# Patient Record
Sex: Female | Born: 1975
Health system: Southern US, Community
[De-identification: ages and names within clinical notes are randomized; demographics above are authoritative.]

## PROBLEM LIST (undated history)

## (undated) DIAGNOSIS — R519 Headache, unspecified: Secondary | ICD-10-CM

## (undated) DIAGNOSIS — E2740 Unspecified adrenocortical insufficiency: Secondary | ICD-10-CM

## (undated) DIAGNOSIS — R011 Cardiac murmur, unspecified: Secondary | ICD-10-CM

## (undated) DIAGNOSIS — M069 Rheumatoid arthritis, unspecified: Secondary | ICD-10-CM

## (undated) DIAGNOSIS — M810 Age-related osteoporosis without current pathological fracture: Secondary | ICD-10-CM

## (undated) DIAGNOSIS — R0683 Snoring: Secondary | ICD-10-CM

## (undated) DIAGNOSIS — K219 Gastro-esophageal reflux disease without esophagitis: Secondary | ICD-10-CM

## (undated) DIAGNOSIS — M0880 Other juvenile arthritis, unspecified site: Secondary | ICD-10-CM

## (undated) DIAGNOSIS — R51 Headache: Secondary | ICD-10-CM

## (undated) DIAGNOSIS — M519 Unspecified thoracic, thoracolumbar and lumbosacral intervertebral disc disorder: Secondary | ICD-10-CM

## (undated) DIAGNOSIS — L309 Dermatitis, unspecified: Secondary | ICD-10-CM

## (undated) HISTORY — PX: CARPAL TUNNEL RELEASE: SHX101

## (undated) HISTORY — PX: CHOLECYSTECTOMY: SHX55

## (undated) HISTORY — PX: OTHER SURGICAL HISTORY: SHX169

## (undated) HISTORY — PX: JOINT REPLACEMENT: SHX530

## (undated) HISTORY — PX: ORIF FEMUR FRACTURE: SHX2119

## (undated) HISTORY — PX: WRIST SURGERY: SHX841

## (undated) HISTORY — PX: ESSURE TUBAL LIGATION: SUR464

## (undated) HISTORY — PX: COLONOSCOPY: SHX174

## (undated) HISTORY — DX: Other juvenile arthritis, unspecified site: M08.80

## (undated) HISTORY — PX: APPENDECTOMY: SHX54

## (undated) HISTORY — PX: ANKLE ARTHROPLASTY: SUR68

---

## 2004-01-05 HISTORY — PX: CHOLECYSTECTOMY: SHX55

## 2012-12-28 DIAGNOSIS — M818 Other osteoporosis without current pathological fracture: Secondary | ICD-10-CM | POA: Insufficient documentation

## 2012-12-28 DIAGNOSIS — M810 Age-related osteoporosis without current pathological fracture: Secondary | ICD-10-CM | POA: Insufficient documentation

## 2013-01-16 DIAGNOSIS — D849 Immunodeficiency, unspecified: Secondary | ICD-10-CM | POA: Insufficient documentation

## 2013-01-16 DIAGNOSIS — Z8619 Personal history of other infectious and parasitic diseases: Secondary | ICD-10-CM | POA: Insufficient documentation

## 2013-01-16 DIAGNOSIS — D1800 Hemangioma unspecified site: Secondary | ICD-10-CM | POA: Insufficient documentation

## 2013-01-16 DIAGNOSIS — E2749 Other adrenocortical insufficiency: Secondary | ICD-10-CM | POA: Insufficient documentation

## 2013-07-19 ENCOUNTER — Ambulatory Visit: Payer: Self-pay | Admitting: Family Medicine

## 2013-08-20 DIAGNOSIS — IMO0002 Reserved for concepts with insufficient information to code with codable children: Secondary | ICD-10-CM

## 2013-08-20 HISTORY — DX: Reserved for concepts with insufficient information to code with codable children: IMO0002

## 2013-08-24 DIAGNOSIS — N890 Mild vaginal dysplasia: Secondary | ICD-10-CM | POA: Insufficient documentation

## 2014-11-01 ENCOUNTER — Other Ambulatory Visit: Payer: Self-pay | Admitting: Obstetrics and Gynecology

## 2014-11-01 ENCOUNTER — Other Ambulatory Visit (HOSPITAL_COMMUNITY): Payer: Self-pay | Admitting: Obstetrics and Gynecology

## 2014-11-01 DIAGNOSIS — N971 Female infertility of tubal origin: Secondary | ICD-10-CM

## 2014-11-15 ENCOUNTER — Other Ambulatory Visit: Payer: Self-pay | Admitting: Family Medicine

## 2014-11-15 DIAGNOSIS — Z1231 Encounter for screening mammogram for malignant neoplasm of breast: Secondary | ICD-10-CM

## 2014-11-20 ENCOUNTER — Ambulatory Visit
Admission: RE | Admit: 2014-11-20 | Discharge: 2014-11-20 | Disposition: A | Discharge status: Home / Self Care | Payer: 59 | Source: Ambulatory Visit | Attending: Family Medicine | Admitting: Family Medicine

## 2014-11-20 DIAGNOSIS — Z1231 Encounter for screening mammogram for malignant neoplasm of breast: Secondary | ICD-10-CM | POA: Insufficient documentation

## 2015-01-20 ENCOUNTER — Ambulatory Visit (HOSPITAL_COMMUNITY)
Admission: RE | Admit: 2015-01-20 | Discharge: 2015-01-20 | Disposition: A | Discharge status: Home / Self Care | Payer: 59 | Source: Ambulatory Visit | Attending: Obstetrics and Gynecology | Admitting: Obstetrics and Gynecology

## 2015-01-20 DIAGNOSIS — Z3049 Encounter for surveillance of other contraceptives: Secondary | ICD-10-CM | POA: Insufficient documentation

## 2015-01-20 DIAGNOSIS — N971 Female infertility of tubal origin: Secondary | ICD-10-CM | POA: Diagnosis not present

## 2015-01-20 MED ORDER — IOHEXOL 300 MG/ML  SOLN
30.0000 mL | Freq: Once | INTRAMUSCULAR | Status: AC | PRN
Start: 2015-01-20 — End: 2015-01-20
  Administered 2015-01-20: 30 mL

## 2015-01-30 DIAGNOSIS — K111 Hypertrophy of salivary gland: Secondary | ICD-10-CM | POA: Diagnosis not present

## 2015-01-30 DIAGNOSIS — M9901 Segmental and somatic dysfunction of cervical region: Secondary | ICD-10-CM | POA: Diagnosis not present

## 2015-01-30 DIAGNOSIS — M542 Cervicalgia: Secondary | ICD-10-CM | POA: Diagnosis not present

## 2015-03-17 ENCOUNTER — Other Ambulatory Visit: Payer: Self-pay | Admitting: Family Medicine

## 2015-03-17 DIAGNOSIS — M810 Age-related osteoporosis without current pathological fracture: Secondary | ICD-10-CM

## 2015-03-17 DIAGNOSIS — M069 Rheumatoid arthritis, unspecified: Secondary | ICD-10-CM | POA: Diagnosis not present

## 2015-03-17 DIAGNOSIS — Z23 Encounter for immunization: Secondary | ICD-10-CM | POA: Diagnosis not present

## 2015-03-17 DIAGNOSIS — E785 Hyperlipidemia, unspecified: Secondary | ICD-10-CM | POA: Diagnosis not present

## 2015-03-20 MED FILL — MAGIC MOUTHWASH BOP FORM: 6 days supply | Qty: 120 | Fill #0

## 2015-03-20 MED FILL — clonazePAM 1 MG TABS: 1 | 30 days supply | Qty: 30 | Fill #0

## 2015-04-11 ENCOUNTER — Ambulatory Visit (INDEPENDENT_AMBULATORY_CARE_PROVIDER_SITE_OTHER): Payer: 59

## 2015-04-11 ENCOUNTER — Encounter (HOSPITAL_COMMUNITY): Payer: Self-pay | Admitting: Emergency Medicine

## 2015-04-11 ENCOUNTER — Ambulatory Visit (HOSPITAL_COMMUNITY)
Admission: EM | Admit: 2015-04-11 | Discharge: 2015-04-11 | Disposition: A | Discharge status: Home / Self Care | Payer: 59 | Attending: Emergency Medicine | Admitting: Emergency Medicine

## 2015-04-11 DIAGNOSIS — M5030 Other cervical disc degeneration, unspecified cervical region: Secondary | ICD-10-CM | POA: Diagnosis not present

## 2015-04-11 DIAGNOSIS — M542 Cervicalgia: Secondary | ICD-10-CM

## 2015-04-11 DIAGNOSIS — M4692 Unspecified inflammatory spondylopathy, cervical region: Secondary | ICD-10-CM

## 2015-04-11 DIAGNOSIS — M47812 Spondylosis without myelopathy or radiculopathy, cervical region: Secondary | ICD-10-CM

## 2015-04-11 HISTORY — DX: Age-related osteoporosis without current pathological fracture: M81.0

## 2015-04-11 HISTORY — DX: Unspecified adrenocortical insufficiency: E27.40

## 2015-04-11 HISTORY — DX: Unspecified thoracic, thoracolumbar and lumbosacral intervertebral disc disorder: M51.9

## 2015-04-11 MED ORDER — KETOROLAC TROMETHAMINE 60 MG/2ML IM SOLN
INTRAMUSCULAR | Status: AC
  Filled 2015-04-11: qty 2

## 2015-04-11 MED ORDER — HYDROCODONE-ACETAMINOPHEN 5-325 MG PO TABS: 1.0000 | ORAL_TABLET | ORAL | Status: DC | PRN

## 2015-04-11 MED ORDER — KETOROLAC TROMETHAMINE 60 MG/2ML IM SOLN
45.0000 mg | Freq: Once | INTRAMUSCULAR | Status: AC
  Administered 2015-04-11: 45 mg via INTRAMUSCULAR

## 2015-04-11 MED FILL — HYDROCODON-APAP 5-325: 5-325 | 4 days supply | Qty: 24 | Fill #0

## 2015-04-11 NOTE — ED Notes (Signed)
PT reports severe neck pain with numbness in bilateral finger tips. Intermittent numbness over back of scalp, lumbar spine, and the bottoms of her feet. PT reports her neck feels as though she has bone on bone pain in neck. PT has tried ibuprofen. PT has a history of RA and degenerative disk disease. Symptoms have been worsening over the past week. PT could not get appt at PCP

## 2015-04-11 NOTE — ED Notes (Signed)
PT made aware that she cannot drive or operate machinery while using Vicodin for pain. PT acknowledges instructions.

## 2015-04-11 NOTE — ED Provider Notes (Signed)
CSN: [id number]     Arrival date & time 04/11/15  1255 History   First MD Initiated Contact with Patient 04/11/15 1308     Chief Complaint  Patient presents with  . Neck Pain   (Consider location/radiation/quality/duration/timing/severity/associated sxs/prior Treatment) HPI Comments: 40 year old female with a history of degenerative disc disease of the C-spine as well as poly-arthropathy secondary to rheumatoid arthritis is complaining of an increase in pain and symptoms associated with C-spine degenerative disease. She feels like she has bowel rubbing on bone in her neck and the pain has increased. She has been seen by her PCP at [facility] and referred to Dr. Sherwood Gambler. She is unable to see her PCP today. Arrangements were made in the last couple days for her to have a C-spine since the last one she had was over 2 years ago. She has the order for her C-spine at work. She has not made any arrangements to have it done at any particular place. Her expectations appear to have the C-spines here done today as well as pain control until she can see Dr. Sherwood Gambler.   Past Medical History  Diagnosis Date  . Arthritis   . Intervertebral disk disease   . Osteoporosis   . Adrenal insufficiency [facility])    Past Surgical History  Procedure Laterality Date  . Appendectomy    . Cholecystectomy    . Abdominal surgery     Family History  Problem Relation Age of Onset  . Breast cancer Mother 12  . Breast cancer Maternal Aunt     2 mat aunts    Social History  Substance Use Topics  . Smoking status: Current Every Day Smoker -- 0.25 packs/day  . Smokeless tobacco: None  . Alcohol Use: Yes     Comment: social   OB History    No data available     Review of Systems  Constitutional: Negative for fever, activity change and fatigue.  HENT: Negative for congestion, ear pain and sore throat.   Respiratory: Negative.   Cardiovascular: Negative.   Genitourinary: Negative.   Musculoskeletal: Positive  for neck pain. Negative for back pain and neck stiffness.  Skin: Negative.   Neurological: Positive for numbness. Negative for dizziness, tremors, syncope, facial asymmetry, speech difficulty and light-headedness.  Psychiatric/Behavioral: Negative.     Allergies  Sulfa antibiotics  Home Medications   Prior to Admission medications   Medication Sig Start Date End Date Taking? Authorizing Provider  butalbital-acetaminophen-caffeine (FIORICET, ESGIC) 50-325-40 MG tablet Take 1 tablet by mouth as needed for headache.   Yes Historical Provider, MD  ibandronate (BONIVA) 150 MG tablet Take 150 mg by mouth every 30 (thirty) days. Take in the morning with a full glass of water, on an empty stomach, and do not take anything else by mouth or lie down for the next 30 min.   Yes Historical Provider, MD  Multiple Vitamin (MULTIVITAMIN) tablet Take 1 tablet by mouth daily.   Yes Historical Provider, MD  predniSONE (DELTASONE) 5 MG tablet Take 5 mg by mouth daily with breakfast.   Yes Historical Provider, MD  HYDROcodone-acetaminophen (NORCO/VICODIN) 5-325 MG tablet Take 1 tablet by mouth every 4 (four) hours as needed. 04/11/15   Janne Napoleon, NP   Meds Ordered and Administered this Visit   Medications  ketorolac (TORADOL) injection 45 mg (45 mg Intramuscular Given 04/11/15 1344)    BP 103/70 mmHg  Pulse 77  Temp(Src) 98.7 F (37.1 C) (Oral)  Resp 16  Ht 5' (1.524  m)  Wt 95 lb (43.092 kg)  BMI 18.55 kg/m2  SpO2 97%  LMP 03/09/2015 No data found.   Physical Exam  Constitutional: She appears well-developed and well-nourished. No distress.  Eyes: Conjunctivae and EOM are normal.  Neck:  Decreased range of motion in regards to left and right rotation which is limited to approximately 40-45 left and right. She is able to slowly flex completely. There is minor tenderness to the posterior spine as well as the left lateral splenius capitis muscle.  Cardiovascular: Normal rate.   Pulmonary/Chest:  Effort normal. No respiratory distress.  Musculoskeletal: Normal range of motion. She exhibits no edema.  Lymphadenopathy:    She has no cervical adenopathy.  Neurological: She is alert. No cranial nerve deficit. She exhibits normal muscle tone.  Skin: Skin is warm and dry.  Psychiatric: She has a normal mood and affect.  Nursing note and vitals reviewed.   ED Course  Procedures (including critical care time)  Labs Review Labs Reviewed - No data to display  Imaging Review Dg Cervical Spine Complete  04/11/2015  CLINICAL DATA:  40 year old female with acute on chronic cervical neck pain. Initial encounter. EXAM: CERVICAL SPINE - COMPLETE 4+ VIEW COMPARISON:  None. FINDINGS: Multilevel degenerative appearing spondylolisthesis in the cervical spine, including C3-C4 and C4-C5. Disc space loss and facet hypertrophy at those levels. Normal prevertebral soft tissue contour. Overall straightening of lordosis. Cervicothoracic junction alignment is within normal limits. Mild dextro convex curvature of the cervical spine. Lung apices within normal limits. C1-C2 alignment difficult to evaluate, appears to be normal on the oblique views, but suspect joint space loss with C1 see 2 subchondral sclerosis. Odontoid appears within normal limits. IMPRESSION: Moderately to severely age advanced degenerative changes in the cervical spine. Multilevel degenerative appearing spondylolisthesis with chronic disc and facet degeneration. Advanced C1-C2 arthritis also suspected. Electronically Signed   By: Genevie Ann M.D.   On: 04/11/2015 14:09     Visual Acuity Review  Right Eye Distance:   Left Eye Distance:   Bilateral Distance:    Right Eye Near:   Left Eye Near:    Bilateral Near:         MDM   1. DDD (degenerative disc disease), cervical   2. Neck pain   3. Facet arthritis of cervical region ([place])    norco 5 mg #24 Soft c-collar F/u dr. Ivory Broad, NP 04/11/15 1439

## 2015-04-11 NOTE — Discharge Instructions (Signed)
Degenerative Disk Disease  Degenerative disk disease is a condition caused by the changes that occur in spinal disks as you grow older. Spinal disks are soft and compressible disks located between the bones of your spine (vertebrae). These disks act like shock absorbers. Degenerative disk disease can affect the whole spine. However, the neck and lower back are most commonly affected. Many changes can occur in the spinal disks with aging, such as:  · The spinal disks may dry and shrink.  · Small tears may occur in the tough, outer covering of the disk (annulus).  · The disk space may become smaller due to loss of water.  · Abnormal growths in the bone (spurs) may occur. This can put pressure on the nerve roots exiting the spinal canal, causing pain.  · The spinal canal may become narrowed.  RISK FACTORS   · Being overweight.  · Having a family history of degenerative disk disease.  · Smoking.  · There is increased risk if you are doing heavy lifting or have a sudden injury.  SIGNS AND SYMPTOMS   Symptoms vary from person to person and may include:  · Pain that varies in intensity. Some people have no pain, while others have severe pain. The location of the pain depends on the part of your backbone that is affected.  ¨ You will have neck or arm pain if a disk in the neck area is affected.  ¨ You will have pain in your back, buttocks, or legs if a disk in the lower back is affected.  · Pain that becomes worse while bending, reaching up, or with twisting movements.  · Pain that may start gradually and then get worse as time passes. It may also start after a major or minor injury.  · Numbness or tingling in the arms or legs.  DIAGNOSIS   Your health care provider will ask you about your symptoms and about activities or habits that may cause the pain. He or she may also ask about any injuries, diseases, or treatments you have had. Your health care provider will examine you to check for the range of movement that is  possible in the affected area, to check for strength in your extremities, and to check for sensation in the areas of the arms and legs supplied by different nerve roots. You may also have:   · An X-ray of the spine.  · Other imaging tests, such as MRI.  TREATMENT   Your health care provider will advise you on the best plan for treatment. Treatment may include:  · Medicines.  · Rehabilitation exercises.  HOME CARE INSTRUCTIONS   · Follow proper lifting and walking techniques as advised by your health care provider.  · Maintain good posture.  · Exercise regularly as advised by your health care provider.  · Perform relaxation exercises.  · Change your sitting, standing, and sleeping habits as advised by your health care provider.  · Change positions frequently.  · Lose weight or maintain a healthy weight as advised by your health care provider.  · Do not use any tobacco products, including cigarettes, chewing tobacco, or electronic cigarettes. If you need help quitting, ask your health care provider.  · Wear supportive footwear.  · Take medicines only as directed by your health care provider.  SEEK MEDICAL CARE IF:   · Your pain does not go away within 1-4 weeks.  · You have significant appetite or weight loss.  SEEK IMMEDIATE MEDICAL CARE IF:   ·   Your pain is severe.  · You notice weakness in your arms, hands, or legs.  · You begin to lose control of your bladder or bowel movements.  · You have fevers or night sweats.  MAKE SURE YOU:   · Understand these instructions.  · Will watch your condition.  · Will get help right away if you are not doing well or get worse.     This information is not intended to replace advice given to you by your health care provider. Make sure you discuss any questions you have with your health care provider.     Document Released: 10/18/2006 Document Revised: 01/11/2014 Document Reviewed: 04/24/2013  Elsevier Interactive Patient Education ©2016 [employer]

## 2015-04-29 DIAGNOSIS — G609 Hereditary and idiopathic neuropathy, unspecified: Secondary | ICD-10-CM | POA: Diagnosis not present

## 2015-04-29 DIAGNOSIS — M4722 Other spondylosis with radiculopathy, cervical region: Secondary | ICD-10-CM | POA: Diagnosis not present

## 2015-04-29 DIAGNOSIS — M5030 Other cervical disc degeneration, unspecified cervical region: Secondary | ICD-10-CM | POA: Diagnosis not present

## 2015-04-29 DIAGNOSIS — M4312 Spondylolisthesis, cervical region: Secondary | ICD-10-CM | POA: Diagnosis not present

## 2015-04-29 DIAGNOSIS — M542 Cervicalgia: Secondary | ICD-10-CM | POA: Diagnosis not present

## 2015-04-30 ENCOUNTER — Other Ambulatory Visit: Payer: Self-pay | Admitting: Neurosurgery

## 2015-04-30 DIAGNOSIS — M4312 Spondylolisthesis, cervical region: Secondary | ICD-10-CM

## 2015-05-01 DIAGNOSIS — M4802 Spinal stenosis, cervical region: Secondary | ICD-10-CM | POA: Diagnosis not present

## 2015-05-01 DIAGNOSIS — M4722 Other spondylosis with radiculopathy, cervical region: Secondary | ICD-10-CM | POA: Diagnosis not present

## 2015-05-06 ENCOUNTER — Ambulatory Visit
Admission: RE | Admit: 2015-05-06 | Discharge: 2015-05-06 | Disposition: A | Discharge status: Home / Self Care | Payer: 59 | Source: Ambulatory Visit | Attending: Neurosurgery | Admitting: Neurosurgery

## 2015-05-06 DIAGNOSIS — M4312 Spondylolisthesis, cervical region: Secondary | ICD-10-CM

## 2015-05-15 ENCOUNTER — Emergency Department (HOSPITAL_COMMUNITY): Payer: 59

## 2015-05-15 ENCOUNTER — Encounter (HOSPITAL_COMMUNITY): Payer: Self-pay

## 2015-05-15 ENCOUNTER — Emergency Department (HOSPITAL_COMMUNITY)
Admission: EM | Admit: 2015-05-15 | Discharge: 2015-05-15 | Disposition: A | Discharge status: Home / Self Care | Payer: 59 | Attending: Emergency Medicine | Admitting: Emergency Medicine

## 2015-05-15 DIAGNOSIS — R55 Syncope and collapse: Secondary | ICD-10-CM | POA: Insufficient documentation

## 2015-05-15 DIAGNOSIS — F17200 Nicotine dependence, unspecified, uncomplicated: Secondary | ICD-10-CM | POA: Insufficient documentation

## 2015-05-15 DIAGNOSIS — R42 Dizziness and giddiness: Secondary | ICD-10-CM | POA: Diagnosis not present

## 2015-05-15 DIAGNOSIS — Z3202 Encounter for pregnancy test, result negative: Secondary | ICD-10-CM | POA: Insufficient documentation

## 2015-05-15 DIAGNOSIS — Z79899 Other long term (current) drug therapy: Secondary | ICD-10-CM | POA: Diagnosis not present

## 2015-05-15 DIAGNOSIS — Z7952 Long term (current) use of systemic steroids: Secondary | ICD-10-CM | POA: Insufficient documentation

## 2015-05-15 LAB — CBC WITH DIFFERENTIAL/PLATELET
Basophils Absolute: 0 K/uL (ref 0.0–0.1)
Basophils Relative: 1 %
Eosinophils Absolute: 0.1 K/uL (ref 0.0–0.7)
Eosinophils Relative: 1 %
HCT: 38.3 % (ref 36.0–46.0)
Hemoglobin: 12.6 g/dL (ref 12.0–15.0)
Lymphocytes Relative: 44 %
Lymphs Abs: 2.9 K/uL (ref 0.7–4.0)
MCH: 34.1 pg — ABNORMAL HIGH (ref 26.0–34.0)
MCHC: 32.9 g/dL (ref 30.0–36.0)
MCV: 103.8 fL — ABNORMAL HIGH (ref 78.0–100.0)
Monocytes Absolute: 0.7 K/uL (ref 0.1–1.0)
Monocytes Relative: 11 %
Neutro Abs: 2.9 K/uL (ref 1.7–7.7)
Neutrophils Relative %: 44 %
Platelets: 328 K/uL (ref 150–400)
RBC: 3.69 MIL/uL — ABNORMAL LOW (ref 3.87–5.11)
RDW: 13.4 % (ref 11.5–15.5)
WBC: 6.6 K/uL (ref 4.0–10.5)

## 2015-05-15 LAB — COMPREHENSIVE METABOLIC PANEL WITH GFR
ALT: 57 U/L — ABNORMAL HIGH (ref 14–54)
AST: 70 U/L — ABNORMAL HIGH (ref 15–41)
Albumin: 3.4 g/dL — ABNORMAL LOW (ref 3.5–5.0)
Alkaline Phosphatase: 152 U/L — ABNORMAL HIGH (ref 38–126)
Anion gap: 14 (ref 5–15)
BUN: <5 mg/dL — ABNORMAL LOW (ref 6–20)
CO2: 23 mmol/L (ref 22–32)
Calcium: 8.9 mg/dL (ref 8.9–10.3)
Chloride: 105 mmol/L (ref 101–111)
Creatinine, Ser: 0.52 mg/dL (ref 0.44–1.00)
GFR calc Af Amer: >60 mL/min
GFR calc non Af Amer: >60 mL/min
Glucose, Bld: 97 mg/dL (ref 65–99)
Potassium: 3.9 mmol/L (ref 3.5–5.1)
Sodium: 142 mmol/L (ref 135–145)
Total Bilirubin: 0.2 mg/dL — ABNORMAL LOW (ref 0.3–1.2)
Total Protein: 5.8 g/dL — ABNORMAL LOW (ref 6.5–8.1)

## 2015-05-15 LAB — URINALYSIS, ROUTINE W REFLEX MICROSCOPIC
Bilirubin Urine: NEGATIVE
Glucose, UA: NEGATIVE mg/dL
Hgb urine dipstick: NEGATIVE
Ketones, ur: NEGATIVE mg/dL
Leukocytes, UA: NEGATIVE
Nitrite: NEGATIVE
Protein, ur: NEGATIVE mg/dL
Specific Gravity, Urine: 1.019 (ref 1.005–1.030)
pH: 5.5 (ref 5.0–8.0)

## 2015-05-15 LAB — PREGNANCY, URINE: Preg Test, Ur: NEGATIVE

## 2015-05-15 MED ORDER — GADOBENATE DIMEGLUMINE 529 MG/ML IV SOLN
10.0000 mL | Freq: Once | INTRAVENOUS | Status: AC
  Administered 2015-05-15: 10 mL via INTRAVENOUS

## 2015-05-15 MED ORDER — SODIUM CHLORIDE 0.9 % IV BOLUS (SEPSIS)
1000.0000 mL | Freq: Once | INTRAVENOUS | Status: AC
  Administered 2015-05-15: 1000 mL via INTRAVENOUS

## 2015-05-15 MED ORDER — MECLIZINE HCL 25 MG PO TABS
12.5000 mg | ORAL_TABLET | Freq: Once | ORAL | Status: AC
  Administered 2015-05-15: 12.5 mg via ORAL
  Filled 2015-05-15: qty 1

## 2015-05-15 NOTE — ED Notes (Signed)
Pt ambulatory w/ steady gait to restroom. 

## 2015-05-15 NOTE — ED Notes (Signed)
Pt verbalized understanding of d/c instructions and follow-up care. No further questions/concerns, VSS, ambulatory w/ steady gait (refused wheelchair) 

## 2015-05-15 NOTE — ED Notes (Signed)
Pt requesting to take daily home pain meds. Ok per [place], [place] to do so at this time.Marland Kitchen

## 2015-05-15 NOTE — ED Provider Notes (Signed)
CSN: [id number]     Arrival date & time 05/15/15  [id number] History   First MD Initiated Contact with Patient 05/15/15 1003     Chief Complaint  Patient presents with  . Near Syncope  . Dizziness     (Consider location/radiation/quality/duration/timing/severity/associated sxs/prior Treatment) HPI  Anne Garrett is a 40 year old female with a past mental history of adrenal insufficiency, rheumatoid arthritis, degenerative disc disease of C-spine who presents the ED today complaining of dizziness and possible syncope. Patient states that for the last 3 days she has had intermittent episodes of dizziness and lightheadedness. She states on the way to work yesterday she had 3 episodes of "nodding off while at the wheel with head jerking". She is not sure if she actually passed out or if she fell asleep. She states that it does not feel like the room is spinning around her but it is more like a lightheaded sensation. She does not feel off balance. She is able to walk without difficulty. She also reports numbness and tingling in her bilateral hands that has been present for several months. She has seen a neurosurgeon for this and had a CT and MRI done of her C-spine which did not show any abnormality. She was then referred to a neurologist for further workup, Dr. Kerman Passey. She is scheduled to see this provider next Tuesday for possible MS. Patient reports blurry vision ongoing for 6 months. No eye pain. Patient states she was also told the a specialist that she has "elevated optic nerves". Patient states that she was told that she does not have hypertension but this was likely just related to her age. Patient states that she does not believe this and is concerned there is something else going on. Patient denies chest pain, shortness of breath, headache, neck pain, weakness.  Past Medical History  Diagnosis Date  . Arthritis   . Intervertebral disk disease   . Osteoporosis   . Adrenal insufficiency [facility])     Past Surgical History  Procedure Laterality Date  . Appendectomy    . Cholecystectomy    . Abdominal surgery     Family History  Problem Relation Age of Onset  . Breast cancer Mother 1  . Breast cancer Maternal Aunt     2 mat aunts    Social History  Substance Use Topics  . Smoking status: Current Every Day Smoker -- 0.25 packs/day  . Smokeless tobacco: None  . Alcohol Use: Yes     Comment: social   OB History    No data available     Review of Systems  All other systems reviewed and are negative.     Allergies  Sulfa antibiotics  Home Medications   Prior to Admission medications   Medication Sig Start Date End Date Taking? Authorizing Provider  butalbital-acetaminophen-caffeine (FIORICET, ESGIC) 50-325-40 MG tablet Take 1 tablet by mouth as needed for headache.    Historical Provider, MD  HYDROcodone-acetaminophen (NORCO/VICODIN) 5-325 MG tablet Take 1 tablet by mouth every 4 (four) hours as needed. 04/11/15   Janne Napoleon, NP  ibandronate (BONIVA) 150 MG tablet Take 150 mg by mouth every 30 (thirty) days. Take in the morning with a full glass of water, on an empty stomach, and do not take anything else by mouth or lie down for the next 30 min.    Historical Provider, MD  Multiple Vitamin (MULTIVITAMIN) tablet Take 1 tablet by mouth daily.    Historical Provider, MD  predniSONE (DELTASONE) 5 MG  tablet Take 5 mg by mouth daily with breakfast.    Historical Provider, MD   BP 113/81 mmHg  Pulse 61  Temp(Src) 99 F (37.2 C) (Oral)  Resp 13  SpO2 99%  LMP 05/01/2015 Physical Exam  Constitutional: She is oriented to person, place, and time. She appears well-developed and well-nourished. No distress.  HENT:  Head: Normocephalic and atraumatic.  Mouth/Throat: No oropharyngeal exudate.  Eyes: Conjunctivae and EOM are normal. Pupils are equal, round, and reactive to light. Right eye exhibits no discharge. Left eye exhibits no discharge. No scleral icterus.   Cardiovascular: Normal rate, regular rhythm, normal heart sounds and intact distal pulses.  Exam reveals no gallop and no friction rub.   No murmur heard. Pulmonary/Chest: Effort normal and breath sounds normal. No respiratory distress. She has no wheezes. She has no rales. She exhibits no tenderness.  Abdominal: Soft. She exhibits no distension. There is no tenderness. There is no guarding.  Musculoskeletal: Normal range of motion. She exhibits no edema.  Neurological: She is alert and oriented to person, place, and time. No cranial nerve deficit.  Strength 5/5 throughout. No sensory deficits.  No gait abnormality. No ataxia. No dysmetria. No facial droop. No slurred speech. No pronator drift.   Skin: Skin is warm and dry. No rash noted. She is not diaphoretic. No erythema. No pallor.  Psychiatric: She has a normal mood and affect. Her behavior is normal.  Nursing note and vitals reviewed.   ED Course  Procedures (including critical care time) Labs Review Labs Reviewed  COMPREHENSIVE METABOLIC PANEL - Abnormal; Notable for the following:    BUN <5 (*)    Total Protein 5.8 (*)    Albumin 3.4 (*)    AST 70 (*)    ALT 57 (*)    Alkaline Phosphatase 152 (*)    Total Bilirubin 0.2 (*)    All other components within normal limits  CBC WITH DIFFERENTIAL/PLATELET - Abnormal; Notable for the following:    RBC 3.69 (*)    MCV 103.8 (*)    MCH 34.1 (*)    All other components within normal limits  URINALYSIS, ROUTINE W REFLEX MICROSCOPIC (NOT AT [facility])  PREGNANCY, URINE  I-STAT CHEM 8, ED    Imaging Review Mr Kizzie Fantasia Contrast  05/15/2015  CLINICAL DATA:  40 year old female with dizziness, blurred vision, presyncope/syncope. Distal extremity numbness 4 year. Blurred vision. Initial encounter. EXAM: MRI HEAD WITHOUT AND WITH CONTRAST TECHNIQUE: Multiplanar, multiecho pulse sequences of the brain and surrounding structures were obtained without and with intravenous contrast. CONTRAST:   101mL MULTIHANCE GADOBENATE DIMEGLUMINE 529 MG/ML IV SOLN COMPARISON:  Cervical spine CT 05/06/2015, and MRI 05/01/2015. FINDINGS: Cerebral volume is within normal limits. No restricted diffusion to suggest acute infarction. No midline shift, mass effect, evidence of mass lesion, ventriculomegaly, extra-axial collection or acute intracranial hemorrhage. Cervicomedullary junction and pituitary are within normal limits. Major intracranial vascular flow voids are preserved. There is mild tortuosity of the cervical ICAs. Pearline Cables and white matter signal is within normal limits throughout the brain. No definite encephalomalacia. No chronic cerebral blood products identified. Following contrast, No abnormal enhancement identified. No dural thickening. Visible internal auditory structures appear normal. Mastoids are clear. Paranasal sinuses are clear. Negative orbit and scalp soft tissues. Abnormalities at the skullbase and cervical spine partially re- demonstrated. Cervical spinal stenosis with spinal cord mass effect at the C4 level better demonstrated on 05/01/2015 (please see that report). IMPRESSION: 1. Normal MRI appearance of the brain.  2. Moderate to severe chronic changes in the cervical spine as described on 05/01/2015 and 05/06/2015 (please see also those reports). Electronically Signed   By: Genevie Ann M.D.   On: 05/15/2015 13:08   I have personally reviewed and evaluated these images and lab results as part of my medical decision-making.   EKG Interpretation   Date/Time:  Thursday May 15 2015 10:04:29 EDT Ventricular Rate:  63 PR Interval:  110 QRS Duration: 85 QT Interval:  421 QTC Calculation: 431 R Axis:   50 Text Interpretation:  Sinus rhythm Borderline short PR interval No  significant change since last tracing Confirmed by [facility] MD, ERIN  ([id number]) on 05/15/2015 2:41:12 PM      MDM   Final diagnoses:  Lightheadedness    40 y.o F with hx of RA, adrenal insufficiency, presents to the ED  today c/o intermittent lightheadedness onset 3 days ago. Pt also reports occasionally "nodding out". She is unsure if she is passing out or just falling asleep. Pt appears well in ED, in NAD. She states she is not necessarily dizzy, room is not spinning around her. No gait disturbance or ataxia. No neurological deficits on exam. Pt is concerned that she may have MS as she is having associated blurry vision for 6 months and bilateral hand pareasthesias. She has had previous normal CT and MRI of c-spine. Pt states she was told by a "specialist that she had optic nerve elevation". Concern that this may be papilledema. Pt is not hypertensive, no hx of HTN. Will obtain MRI brain to evaluate for workup of possible papilledema. MRi negative for any acute abnormality. Also, now less likely to be MS. No evidence of posterior stroke. Mild AST/ALT elevation. This is nonspecific. All other lab work wnl. Pt was given IV fluids and meclizine in the ED without relief of her symptoms. Orthostatic VS wnl. EKG unremarkable. Doubt that pt is having true episodes of syncope. It is possible that symptoms are related to pts anxiety. Pt has scheduled follow up with a neurosurgeon on Monday and a neurologist on Tuesday. Feel that pt is safe for discharge. Discussed treatment plan with pt who is agreeable. Return precautions outlined in patient discharge instructions.   Patient was discussed with and seen by Dr. Billy Fischer who agrees with the treatment plan.     Dondra Spry [place], PA-C 05/16/15 [place], MD 05/22/15 1016

## 2015-05-15 NOTE — Discharge Instructions (Signed)
Dizziness Dizziness is a common problem. It is a feeling of unsteadiness or light-headedness. You may feel like you are about to faint. Dizziness can lead to injury if you stumble or fall. Anyone can become dizzy, but dizziness is more common in older adults. This condition can be caused by a number of things, including medicines, dehydration, or illness. HOME CARE INSTRUCTIONS Taking these steps may help with your condition: Eating and Drinking  Drink enough fluid to keep your urine clear or pale yellow. This helps to keep you from becoming dehydrated. Try to drink more clear fluids, such as water.  Do not drink alcohol.  Limit your caffeine intake if directed by your health care provider.  Limit your salt intake if directed by your health care provider. Activity  Avoid making quick movements.  Rise slowly from chairs and steady yourself until you feel okay.  In the morning, first sit up on the side of the bed. When you feel okay, stand slowly while you hold onto something until you know that your balance is fine.  Move your legs often if you need to stand in one place for a long time. Tighten and relax your muscles in your legs while you are standing.  Do not drive or operate heavy machinery if you feel dizzy.  Avoid bending down if you feel dizzy. Place items in your home so that they are easy for you to reach without leaning over. Lifestyle  Do not use any tobacco products, including cigarettes, chewing tobacco, or electronic cigarettes. If you need help quitting, ask your health care provider.  Try to reduce your stress level, such as with yoga or meditation. Talk with your health care provider if you need help. General Instructions  Watch your dizziness for any changes.  Take medicines only as directed by your health care provider. Talk with your health care provider if you think that your dizziness is caused by a medicine that you are taking.  Tell a friend or a family  member that you are feeling dizzy. If he or she notices any changes in your behavior, have this person call your health care provider.  Keep all follow-up visits as directed by your health care provider. This is important. SEEK MEDICAL CARE IF:  Your dizziness does not go away.  Your dizziness or light-headedness gets worse.  You feel nauseous.  You have reduced hearing.  You have new symptoms.  You are unsteady on your feet or you feel like the room is spinning. SEEK IMMEDIATE MEDICAL CARE IF:  You vomit or have diarrhea and are unable to eat or drink anything.  You have problems talking, walking, swallowing, or using your arms, hands, or legs.  You feel generally weak.  You are not thinking clearly or you have trouble forming sentences. It may take a friend or family member to notice this.  You have chest pain, abdominal pain, shortness of breath, or sweating.  Your vision changes.  You notice any bleeding.  You have a headache.  You have neck pain or a stiff neck.  You have a fever.   This information is not intended to replace advice given to you by your health care provider. Make sure you discuss any questions you have with your health care provider.   Keep scheduled appointment with your neurosurgeon and neurologist. Return to the ED if you experience loss of consciousness, chest pain, shortness of breath, change in vision, weakness.

## 2015-05-15 NOTE — ED Notes (Signed)
Pt reports dizziness and episodes where she "nods off almost as if about to pass out" x 3 days. Pt noted episodes while driving and at work. Pt has been tested for neuropathy or other causes of weakness/numbness. All tests WDL. Pt reports numbness at bottom of feet and arms/hands for 1+ years, worse x 2 days. Also reports blurred vision. Pt ambulatory to room.

## 2015-05-19 DIAGNOSIS — M542 Cervicalgia: Secondary | ICD-10-CM | POA: Diagnosis not present

## 2015-05-19 DIAGNOSIS — M4722 Other spondylosis with radiculopathy, cervical region: Secondary | ICD-10-CM | POA: Diagnosis not present

## 2015-05-19 DIAGNOSIS — M4312 Spondylolisthesis, cervical region: Secondary | ICD-10-CM | POA: Diagnosis not present

## 2015-05-19 DIAGNOSIS — M5030 Other cervical disc degeneration, unspecified cervical region: Secondary | ICD-10-CM | POA: Diagnosis not present

## 2015-05-19 DIAGNOSIS — M4712 Other spondylosis with myelopathy, cervical region: Secondary | ICD-10-CM | POA: Diagnosis not present

## 2015-05-20 ENCOUNTER — Encounter: Payer: Self-pay | Admitting: Neurology

## 2015-05-20 ENCOUNTER — Ambulatory Visit (INDEPENDENT_AMBULATORY_CARE_PROVIDER_SITE_OTHER): Payer: 59 | Admitting: Neurology

## 2015-05-20 VITALS — BP 100/60 | HR 60 | Ht 60.0 in | Wt 91.5 lb

## 2015-05-20 DIAGNOSIS — M0600 Rheumatoid arthritis without rheumatoid factor, unspecified site: Secondary | ICD-10-CM

## 2015-05-20 DIAGNOSIS — R200 Anesthesia of skin: Secondary | ICD-10-CM | POA: Diagnosis not present

## 2015-05-20 DIAGNOSIS — M069 Rheumatoid arthritis, unspecified: Secondary | ICD-10-CM | POA: Insufficient documentation

## 2015-05-20 DIAGNOSIS — G5603 Carpal tunnel syndrome, bilateral upper limbs: Secondary | ICD-10-CM

## 2015-05-20 DIAGNOSIS — G4719 Other hypersomnia: Secondary | ICD-10-CM

## 2015-05-20 DIAGNOSIS — R0683 Snoring: Secondary | ICD-10-CM

## 2015-05-20 DIAGNOSIS — G5600 Carpal tunnel syndrome, unspecified upper limb: Secondary | ICD-10-CM | POA: Insufficient documentation

## 2015-05-20 NOTE — Progress Notes (Signed)
[facility]  PATIENT: Anne Garrett DOB: 05-Sep-1975  REFERRING DOCTOR OR PCP:  Norman Clay (PCP), Jovita Gamma (Neurosurgery) SOURCE: patient, records from Dr. Vickki Muff, Imaging/lab reports, MRI images on PACS  _________________________________   HISTORICAL  CHIEF COMPLAINT:  Chief Complaint  Patient presents with  . Rm 13  . New Patient (Initial Visit)    New pt referral for memory and movement d/o. Pt reports generalized numbness/tingling, decreased strength in R hand and bilat upper extremity heaviness. Hx of RA and neck pain. Had ER visit last week d/t dizziness. Uses Percocet prn for neck pain.    HISTORY OF PRESENT ILLNESS:  Anne Garrett is a 40 yo woman with Juvenile rheumatoid arthritis who has numbness in her hands who has also had episodes of sleepiness over the past couple weeks. Additionally, she has noted some difficulty with memory at times.  Numbness:   She reports numbness in the first 4 fingers of both hands.   Numbness is mostly in the fingertips but also occasionally involves the palm and dorsum.      Her wrists were fused in 1998 (left) and 2003 and 2015 (right, 1st operation failed).  She had Carpal tunnel release around 2013 on the right. Of note, she has severe degenerative changes at C3-C4 and C4-C5 with anterolisthesis and myelopathic signal within the spinal cord adjacent to C4.   She is scheduled to do ACDF C3-C5 in June with Dr. Sherwood Gambler.    She also has numbness in the bottom of both feet and in the back of the head.    TSH, B12, HgbA1C were all normal.          Excessive sleepiness:  Last Tuesday and Wednesday, she nodded off several times while driving and also at work.    She generally does not have excessive daytime sleepiness.  She went to the ER and was advised to follow up with Dr. Sherwood Gambler and Neurology.   Of note, she was on Percocet 7.5 at the time and she feels she may have still been sleepy during the morning.    She had a  sleep study in 2008 and did not have OSA.    She snores heavily but has not been noted to have apnea years ago.   She has not woken herself up gasping for air.  Her ex-husband had not noted OSA.      EPWORTH SLEEPINESS SCALE  (while taking Percocet)  On a scale of 0 - 3 what is the chance of dozing:  Sitting and Reading:   0 Watching TV:    0 Sitting inactive in a public place: 2 Passenger in car for one hour: 0 Lying down to rest in the afternoon: 3 Sitting and talking to someone: 0 Sitting quietly after lunch:  0 In a car, stopped in traffic:  1  Total (out of 24):    6/24.       Memory lapses:      Over the past 6 months, she has also noted that her memory is worse and she has been more forgetful.   With hints, she will usually remember what she had forgotten.  RA:   Currently she is on Prednisone but was on Arava, Enbrel,  Humira, Methotrexate, and Plaquenil at various times in the past.   She has had bilateral wrist arthrodesis, right foot arthrodesis and ankle replacement, bilateral hip replacement.   REVIEW OF SYSTEMS: Constitutional: No fevers, chills, sweats, or change in appetite.  She notes fatigue Eyes: Occasional blurry vision. She denies double vision, eye pain Ear, nose and throat: No hearing loss, ear pain, nasal congestion, sore throat Cardiovascular: No chest pain, palpitations.  Some edema Respiratory: No shortness of breath at rest or with exertion.   No wheezes.  Has snoring GastrointestinaI: No nausea, vomiting, diarrhea, abdominal pain, fecal incontinence Genitourinary: No dysuria, urinary retention or frequency.  No nocturia. Musculoskeletal: as above (RA, spine) Integumentary: No rash, pruritus.  She has birthmarks in malls Neurological: as above Psychiatric: No depression at this time.  No anxiety Endocrine: No palpitations, diaphoresis, change in appetite, change in weigh or increased thirst Hematologic/Lymphatic: No anemia, purpura,  petechiae. Allergic/Immunologic: No itchy/runny eyes, nasal congestion, recent allergic reactions, rashes  ALLERGIES: Allergies  Allergen Reactions  . Leflunomide Diarrhea  . Methotrexate     Other reaction(s): Other (See Comments) fatigue  . Paroxetine Hcl Diarrhea  . Sulfa Antibiotics Itching  . Teriparatide     Other reaction(s): Muscle Pain  . Valproic Acid And Related Rash    HOME MEDICATIONS:  Current outpatient prescriptions:  .  butalbital-acetaminophen-caffeine (FIORICET, ESGIC) 50-325-40 MG tablet, Take 1 tablet by mouth as needed for headache., Disp: , Rfl:  .  Calcium Carb-Cholecalciferol [phone] MG-UNIT CAPS, Take 1 tablet by mouth daily., Disp: , Rfl:  .  clonazePAM (KLONOPIN) 1 MG tablet, Take 0.5 mg by mouth daily as needed for anxiety., Disp: , Rfl:  .  ibandronate (BONIVA) 150 MG tablet, Take 150 mg by mouth every 30 (thirty) days. Take in the morning with a full glass of water, on an empty stomach, and do not take anything else by mouth or lie down for the next 30 min., Disp: , Rfl:  .  methocarbamol (ROBAXIN) 500 MG tablet, Take 500 mg by mouth 2 (two) times daily as needed for muscle spasms., Disp: , Rfl:  .  Multiple Vitamin (MULTIVITAMIN) tablet, Take 1 tablet by mouth daily., Disp: , Rfl:  .  oxyCODONE-acetaminophen (PERCOCET) 7.5-325 MG tablet, Take 1 tablet by mouth every 6 (six) hours as needed for severe pain., Disp: , Rfl:  .  predniSONE (DELTASONE) 5 MG tablet, Take 5 mg by mouth every evening. , Disp: , Rfl:  .  triamcinolone cream (KENALOG) 0.1 %, Apply 1 application topically 2 (two) times daily., Disp: , Rfl:  .  vitamin C (ASCORBIC ACID) 500 MG tablet, Take 500 mg by mouth daily., Disp: , Rfl:  .  Vitamin D, Ergocalciferol, (DRISDOL) 50000 units CAPS capsule, Take 50,000 Units by mouth every 30 (thirty) days. Takes with Boniva, Disp: , Rfl:   PAST MEDICAL HISTORY: Past Medical History  Diagnosis Date  . Arthritis   . Intervertebral disk disease    . Osteoporosis   . Adrenal insufficiency ([place])     PAST SURGICAL HISTORY: Past Surgical History  Procedure Laterality Date  . Appendectomy    . Cholecystectomy    . Abdominal surgery    . Essure tubal ligation      Essure Implant-Permanent birth control    FAMILY HISTORY: Family History  Problem Relation Age of Onset  . Breast cancer Mother 66  . Breast cancer Maternal Aunt     2 mat aunts   . Nephritis Father   . Epilepsy Father   . Multiple sclerosis Maternal Uncle   . Psoriasis Paternal Aunt     SOCIAL HISTORY:  Social History   Social History  . Marital Status: Single    Spouse Name: N/A  .  Number of Children: 2  . Years of Education: 12   Occupational History  . Compliance Audit Manager [employer]   Social History Main Topics  . Smoking status: Current Every Day Smoker -- 0.25 packs/day  . Smokeless tobacco: Not on file  . Alcohol Use: Yes     Comment: social  . Drug Use: No  . Sexual Activity: Not on file   Other Topics Concern  . Not on file   Social History Narrative   Lives at home w/ 2 cats and child every other weekend   Significant other - Gerald Stabs   Right-hand   Drinks about 2 cups of coffee or soda per day     PHYSICAL EXAM  Filed Vitals:   05/20/15 0951  BP: 100/60  Pulse: 60  Height: 5' (1.524 m)  Weight: 91 lb 8 oz (41.504 kg)    Body mass index is 17.87 kg/(m^2).   General: The patient is well-developed and well-nourished and in no acute distress  Neck: The neck is supple, no carotid bruits are noted.  The neck is nontender.  Cardiovascular: The heart has a regular rate and rhythm with a normal S1 and S2. There were no murmurs, gallops or rubs.    Skin/extremities: Extremities are without rash.   She has had multiple surgical procedures involving the risks. There is arthrodesis at both wrists and right foot  Musculoskeletal:  Back is nontender  Neurologic Exam  Mental status: The patient is alert and oriented x 3 at  the time of the examination. The patient has apparent normal recent and remote memory, with an apparently normal attention span and concentration ability.   Speech is normal.  Cranial nerves: Extraocular movements are full. Pupils are equal, round, and reactive to light and accomodation.   Facial symmetry is present. There is good facial sensation to soft touch bilaterally.Facial strength is normal.  Trapezius and sternocleidomastoid strength is normal. No dysarthria is noted.  The tongue is midline, and the patient has symmetric elevation of the soft palate. No obvious hearing deficits are noted.  Motor:  Muscle bulk is normal.   Tone is normal. Strength is  5 / 5 in all 4 extremities.   Sensory: Sensory testing is intact to  soft touch and vibration sensation in all 4 extremities.  Coordination: Cerebellar testing reveals good finger-nose-finger and heel-to-shin bilaterally.  Gait and station: Station is normal.   Gait is arthritic. Tandem gait is mildly wide. Romberg is negative.   Reflexes: Deep tendon reflexes are brisk at the knees and normal in the arms bilaterally.   Plantar responses are flexor.    DIAGNOSTIC DATA (LABS, IMAGING, TESTING) - I reviewed patient records, labs, notes, testing and imaging myself where available.  Lab Results  Component Value Date   WBC 6.6 05/15/2015   HGB 12.6 05/15/2015   HCT 38.3 05/15/2015   MCV 103.8* 05/15/2015   PLT 328 05/15/2015      Component Value Date/Time   NA 142 05/15/2015 1013   K 3.9 05/15/2015 1013   CL 105 05/15/2015 1013   CO2 23 05/15/2015 1013   GLUCOSE 97 05/15/2015 1013   BUN <5* 05/15/2015 1013   CREATININE 0.52 05/15/2015 1013   CALCIUM 8.9 05/15/2015 1013   PROT 5.8* 05/15/2015 1013   ALBUMIN 3.4* 05/15/2015 1013   AST 70* 05/15/2015 1013   ALT 57* 05/15/2015 1013   ALKPHOS 152* 05/15/2015 1013   BILITOT 0.2* 05/15/2015 1013   GFRNONAA >60 05/15/2015 1013  GFRAA >60 05/15/2015 1013    I personally  reviewed the MRI of the cervical spine from 05/01/2015 and the MRI of the head from 05/15/2015 on PACS. The MRI of the cervical spine shows myelopathic signal within the spinal cord adjacent to C4. There is anterolisthesis of C3-C4 and C4-C5 with spinal stenosis at both of those levels. Additionally, there is atlantoaxial and atlantooccipital arthritis likely from the rheumatoid arthritis.   The MRI of the brain was normal.   Imaging reports were also evaluated.    ASSESSMENT AND PLAN   Numbness - Plan: NCV with EMG(electromyography)  Rheumatoid arthritis with negative rheumatoid factor, involving unspecified site ([place]) - Plan: NCV with EMG(electromyography)  Bilateral carpal tunnel syndrome - Plan: NCV with EMG(electromyography)  Snoring - Plan: Split night study  Excessive daytime sleepiness - Plan: Split night study   In summary, Anne Garrett is a 40 year old woman with rheumatoid arthritis who has severe cervical degenerative changes with unstable anterolisthesis at C3-C4 and C4-C5 and myelopathic signal adjacent to C4. She also has a history of carpal tunnel syndrome. The numbness that she is experiencing most likely due to the combination of these issues, and possibly a superimposed ulnar neuropathy as she had Tinel's signs at the elbows. She could also have a mild superimposed polyneuropathy associated with the rheumatoid arthritis we will evaluate this further with a NCV/EMG.     She also has had episodes of excessive sleepiness and she snores. Therefore, we need to consider the possibility that the spells she experienced while driving could be related to obstructive sleep apnea. We will check a sleep study. I am less concerned about her memory difficulties as they appear to represent decreased attention more than decreased storage and she continues to be completely functional at home and work.  I will see her back for the NCV/EMG and further follow-up will be arranged as  needed.  Thank you for asking me to see Anne Garrett for a neurologic consultation. Please let me know if I can be of further assistance with her or other patients in the future.  Lafern Brinkley A. Felecia Shelling, MD, PhD [id number], [id number] AM Certified in Neurology, Clinical Neurophysiology, Sleep Medicine, Pain Medicine and Neuroimaging  [facility] [address] [place], [place] [address] [phone]

## 2015-05-21 ENCOUNTER — Other Ambulatory Visit: Payer: Self-pay | Admitting: Neurosurgery

## 2015-05-21 NOTE — Progress Notes (Signed)
TODAY'S OV NOTE HAS BEEN FAXED TO DR. [facility] AT FAX # [phone], [place] # [phone]/fim

## 2015-05-29 ENCOUNTER — Ambulatory Visit (INDEPENDENT_AMBULATORY_CARE_PROVIDER_SITE_OTHER): Payer: Self-pay | Admitting: Neurology

## 2015-05-29 ENCOUNTER — Ambulatory Visit (INDEPENDENT_AMBULATORY_CARE_PROVIDER_SITE_OTHER): Payer: 59 | Admitting: Neurology

## 2015-05-29 DIAGNOSIS — G5601 Carpal tunnel syndrome, right upper limb: Secondary | ICD-10-CM

## 2015-05-29 DIAGNOSIS — G5603 Carpal tunnel syndrome, bilateral upper limbs: Secondary | ICD-10-CM

## 2015-05-29 DIAGNOSIS — R200 Anesthesia of skin: Secondary | ICD-10-CM | POA: Diagnosis not present

## 2015-05-29 DIAGNOSIS — G5602 Carpal tunnel syndrome, left upper limb: Secondary | ICD-10-CM | POA: Diagnosis not present

## 2015-05-29 DIAGNOSIS — M0600 Rheumatoid arthritis without rheumatoid factor, unspecified site: Secondary | ICD-10-CM

## 2015-05-29 DIAGNOSIS — Z0289 Encounter for other administrative examinations: Secondary | ICD-10-CM

## 2015-05-29 NOTE — Progress Notes (Signed)
   STUDY DATE: 05/29/2015  PATIENT NAME: Anne Garrett DOB: 14-Dec-1975 MRN: [id number]  HISTORY:  Anne Garrett is a 40 year old woman with rheumatoid arthritis and a history of carpal tunnel syndrome who has noted some numbness in her arms and a zinging sensation down the left arm at times. She also has severe as cervical degenerative changes with unstable spondylolisthesis in the upper cervical spine. She is going to have surgery next month.  NERVE CONDUCTION STUDIES:  Bilateral median motor, ulnar motor, median sensory and ulnar sensory responses were normal. The median and ulnar F-wave responses were normal and symmetric.  EMG STUDIES:  Needle EMG of the left deltoid, triceps, biceps, pronator teres, extensor digitorum communis communis, first dorsal interosseous, abductor pollicis brevis, rhomboids and C3-C4 paraspinal muscles was normal.  IMPRESSION:  This is a normal NCV/EMG study. There was no evidence of polyneuropathy, focal mononeuropathy or myopathy in either arm. There was no evidence of significant left radiculopathy.   Chrishun Scheer A. Felecia Shelling, MD, PhD Certified in Neurology, [place] [facility], Sleep Medicine, Pain Medicine and Neuroimaging  [facility] [address], [place] [employer], [place] [address] [phone]

## 2015-06-06 ENCOUNTER — Ambulatory Visit (INDEPENDENT_AMBULATORY_CARE_PROVIDER_SITE_OTHER): Payer: 59 | Admitting: Neurology

## 2015-06-06 DIAGNOSIS — G4710 Hypersomnia, unspecified: Secondary | ICD-10-CM

## 2015-06-06 DIAGNOSIS — R0683 Snoring: Secondary | ICD-10-CM

## 2015-06-06 DIAGNOSIS — G4719 Other hypersomnia: Secondary | ICD-10-CM

## 2015-06-10 DIAGNOSIS — M4312 Spondylolisthesis, cervical region: Secondary | ICD-10-CM | POA: Diagnosis not present

## 2015-06-10 DIAGNOSIS — M542 Cervicalgia: Secondary | ICD-10-CM | POA: Diagnosis not present

## 2015-06-10 DIAGNOSIS — M4712 Other spondylosis with myelopathy, cervical region: Secondary | ICD-10-CM | POA: Diagnosis not present

## 2015-06-10 DIAGNOSIS — M5030 Other cervical disc degeneration, unspecified cervical region: Secondary | ICD-10-CM | POA: Diagnosis not present

## 2015-06-12 ENCOUNTER — Telehealth: Payer: Self-pay | Admitting: Neurology

## 2015-06-12 ENCOUNTER — Encounter (HOSPITAL_COMMUNITY)
Admission: RE | Admit: 2015-06-12 | Discharge: 2015-06-12 | Disposition: A | Discharge status: Home / Self Care | Payer: 59 | Source: Ambulatory Visit | Attending: Neurosurgery | Admitting: Neurosurgery

## 2015-06-12 ENCOUNTER — Telehealth: Payer: Self-pay | Admitting: *Deleted

## 2015-06-12 ENCOUNTER — Encounter (HOSPITAL_COMMUNITY): Payer: Self-pay

## 2015-06-12 DIAGNOSIS — Z01812 Encounter for preprocedural laboratory examination: Secondary | ICD-10-CM | POA: Insufficient documentation

## 2015-06-12 DIAGNOSIS — M4722 Other spondylosis with radiculopathy, cervical region: Secondary | ICD-10-CM | POA: Insufficient documentation

## 2015-06-12 DIAGNOSIS — M4712 Other spondylosis with myelopathy, cervical region: Secondary | ICD-10-CM | POA: Insufficient documentation

## 2015-06-12 HISTORY — DX: Headache, unspecified: R51.9

## 2015-06-12 HISTORY — DX: Headache: R51

## 2015-06-12 HISTORY — DX: Cardiac murmur, unspecified: R01.1

## 2015-06-12 LAB — CBC
HCT: 39.2 % (ref 36.0–46.0)
Hemoglobin: 12.3 g/dL (ref 12.0–15.0)
MCH: 32.2 pg (ref 26.0–34.0)
MCHC: 31.4 g/dL (ref 30.0–36.0)
MCV: 102.6 fL — ABNORMAL HIGH (ref 78.0–100.0)
Platelets: 316 10*3/uL (ref 150–400)
RBC: 3.82 MIL/uL — ABNORMAL LOW (ref 3.87–5.11)
RDW: 13.1 % (ref 11.5–15.5)
WBC: 7.3 10*3/uL (ref 4.0–10.5)

## 2015-06-12 LAB — BASIC METABOLIC PANEL
Anion gap: 8 (ref 5–15)
BUN: 5 mg/dL — ABNORMAL LOW (ref 6–20)
CO2: 26 mmol/L (ref 22–32)
Calcium: 9.1 mg/dL (ref 8.9–10.3)
Chloride: 104 mmol/L (ref 101–111)
Creatinine, Ser: 0.68 mg/dL (ref 0.44–1.00)
GFR calc Af Amer: >60 mL/min (ref >60)
GFR calc non Af Amer: >60 mL/min (ref >60)
Glucose, Bld: 89 mg/dL (ref 65–99)
Potassium: 3.8 mmol/L (ref 3.5–5.1)
Sodium: 138 mmol/L (ref 135–145)

## 2015-06-12 LAB — SURGICAL PCR SCREEN
MRSA, PCR: NEGATIVE
Staphylococcus aureus: NEGATIVE

## 2015-06-12 LAB — HCG, SERUM, QUALITATIVE: Preg, Serum: NEGATIVE

## 2015-06-12 NOTE — Pre-Procedure Instructions (Signed)
Anne Garrett  06/12/2015      [facility] - [place], [place] - [address] [place] [address] Phone: [phone] Fax: [phone]  [id number] - [place], [place] - [address] [place] [address] Phone: [phone] Fax: [phone]    Your procedure is scheduled on June 15  Report to [place] at 530 A.M.  Call this number if you have problems the morning of surgery:  [phone]   Remember:  Do not eat food or drink liquids after midnight.  Take these medicines the morning of surgery with A SIP OF WATER percocet if needed  Stop taking Aspirin, Ibuprofen, Advil, motrin, Aleve, bc's, Goody's, Herbal medication, Fish Oil, Vitamins stop 7 days prior to surgery   Do not wear jewelry, make-up or nail polish.  Do not wear lotions, powders, or perfumes.  You may wear deodorant.  Do not shave 48 hours prior to surgery.  Men may shave face and neck.  Do not bring valuables to the hospital.  [facility] is not responsible for any belongings or valuables.  Contacts, dentures or bridgework may not be worn into surgery.  Leave your suitcase in the car.  After surgery it may be brought to your room.  For patients admitted to the hospital, discharge time will be determined by your treatment team.  Patients discharged the day of surgery will not be allowed to drive home.    Special instructions:  [place] - Preparing for Surgery  Before surgery, you can play an important role.  Because skin is not sterile, your skin needs to be as free of germs as possible.  You can reduce the number of germs on you skin by washing with CHG (chlorahexidine gluconate) soap before surgery.  CHG is an antiseptic cleaner which kills germs and bonds with the skin to continue killing germs even after washing.  Please DO NOT use if you have an allergy to CHG or antibacterial soaps.  If your skin becomes  reddened/irritated stop using the CHG and inform your nurse when you arrive at Short Stay.  Do not shave (including legs and underarms) for at least 48 hours prior to the first CHG shower.  You may shave your face.  Please follow these instructions carefully:   1.  Shower with CHG Soap the night before surgery and the   morning of Surgery.  2.  If you choose to wash your hair, wash your hair first as usual with your       normal shampoo.  3.  After you shampoo, rinse your hair and body thoroughly to remove the                      Shampoo.  4.  Use CHG as you would any other liquid soap.  You can apply chg directly       to the skin and wash gently with scrungie or a clean washcloth.  5.  Apply the CHG Soap to your body ONLY FROM THE NECK DOWN.        Do not use on open wounds or open sores.  Avoid contact with your eyes,       ears, mouth and genitals (private parts).  Wash genitals (private parts)       with your normal soap.  6.  Wash thoroughly, paying special attention to the area where your surgery  will be performed.  7.  Thoroughly rinse your body with warm water from the neck down.  8.  DO NOT shower/wash with your normal soap after using and rinsing off       the CHG Soap.  9.  Pat yourself dry with a clean towel.            10.  Wear clean pajamas.            11.  Place clean sheets on your bed the night of your first shower and do not        sleep with pets.  Day of Surgery  Do not apply any lotions/deoderants the morning of surgery.  Please wear clean clothes to the hospital/surgery center.    Please read over the following fact sheets that you were given. Pain Booklet, Coughing and Deep Breathing, MRSA Information and Surgical Site Infection Prevention

## 2015-06-12 NOTE — Progress Notes (Signed)
PCP is Dr Norman Clay Denies seeing a cardiologist. Denies ever having a card cath or stress test. States she had an echo 6 years ago and it was fine- she states one Dr heard a slight murmur, but no one else has heard it.

## 2015-06-12 NOTE — Telephone Encounter (Signed)
06-06-15 sleep study faxed to Dr. Sherwood Gambler (fax # [phone]) with notation by Dr. Felecia Shelling that Mihaela is cleared for surgery/fim

## 2015-06-12 NOTE — Telephone Encounter (Signed)
I spoke to [place] to let her know the results of the polysomnogram. She has mild sleep apnea, worse on her back and during REM sleep.  It is not bad enough to recommend CPAP. An oral appliance might be of benefit. We'll check with her dentist and we can forward the name of one or 2 dentist who make these if she wants.  We will also forward the results of the sleep study and NCV/EMG to Dr. Sherwood Gambler of neurosurgery. She is scheduled to have surgery next week.

## 2015-06-18 MED ORDER — CEFAZOLIN SODIUM-DEXTROSE 2-4 GM/100ML-% IV SOLN
2.0000 g | INTRAVENOUS | Status: AC
  Administered 2015-06-19: 2 g via INTRAVENOUS
  Filled 2015-06-18: qty 100

## 2015-06-18 NOTE — Anesthesia Preprocedure Evaluation (Addendum)
Anesthesia Evaluation  Patient identified by MRN, date of birth, ID band Patient awake    Reviewed: Allergy & Precautions, H&P , Patient's Chart, lab work & pertinent test results, reviewed documented beta blocker date and time   Airway Mallampati: II  TM Distance: >3 FB Neck ROM: full    Dental no notable dental hx.    Pulmonary Current Smoker,    Pulmonary exam normal breath sounds clear to auscultation       Cardiovascular  Rhythm:regular Rate:Normal     Neuro/Psych    GI/Hepatic   Endo/Other    Renal/GU      Musculoskeletal   Abdominal   Peds  Hematology   Anesthesia Other Findings Good neck ROM without sx of arm/leg pain or numbness  Reproductive/Obstetrics                          Anesthesia Physical Anesthesia Plan  ASA: II  Anesthesia Plan: General   Post-op Pain Management:    Induction: Intravenous  Airway Management Planned: Oral ETT and Video Laryngoscope Planned  Additional Equipment:   Intra-op Plan:   Post-operative Plan: Extubation in OR  Informed Consent: I have reviewed the patients History and Physical, chart, labs and discussed the procedure including the risks, benefits and alternatives for the proposed anesthesia with the patient or authorized representative who has indicated his/her understanding and acceptance.   Dental Advisory Given and Dental advisory given  Plan Discussed with: CRNA and Surgeon  Anesthesia Plan Comments: (Myelopathy Sx; may need Glidescope  Discussed general anesthesia, including possible nausea, instrumentation of airway, sore throat,pulmonary aspiration, etc. I asked if the were any outstanding questions, or  concerns before we proceeded. )        Anesthesia Quick Evaluation

## 2015-06-19 ENCOUNTER — Ambulatory Visit (HOSPITAL_COMMUNITY): Payer: 59 | Admitting: Anesthesiology

## 2015-06-19 ENCOUNTER — Encounter (HOSPITAL_COMMUNITY)
Admission: RE | Disposition: A | Discharge status: Home / Self Care | Payer: Self-pay | Source: Ambulatory Visit | Attending: Neurosurgery

## 2015-06-19 ENCOUNTER — Observation Stay (HOSPITAL_COMMUNITY)
Admission: RE | Admit: 2015-06-19 | Discharge: 2015-06-20 | Disposition: A | Discharge status: Home / Self Care | Payer: 59 | Source: Ambulatory Visit | Attending: Neurosurgery | Admitting: Neurosurgery

## 2015-06-19 ENCOUNTER — Encounter (HOSPITAL_COMMUNITY): Payer: Self-pay | Admitting: General Practice

## 2015-06-19 ENCOUNTER — Observation Stay (HOSPITAL_COMMUNITY): Payer: 59

## 2015-06-19 DIAGNOSIS — M5001 Cervical disc disorder with myelopathy,  high cervical region: Secondary | ICD-10-CM | POA: Diagnosis not present

## 2015-06-19 DIAGNOSIS — M4722 Other spondylosis with radiculopathy, cervical region: Secondary | ICD-10-CM | POA: Diagnosis not present

## 2015-06-19 DIAGNOSIS — M5011 Cervical disc disorder with radiculopathy,  high cervical region: Secondary | ICD-10-CM | POA: Diagnosis not present

## 2015-06-19 DIAGNOSIS — M5020 Other cervical disc displacement, unspecified cervical region: Secondary | ICD-10-CM | POA: Diagnosis present

## 2015-06-19 DIAGNOSIS — Z79899 Other long term (current) drug therapy: Secondary | ICD-10-CM | POA: Insufficient documentation

## 2015-06-19 DIAGNOSIS — M4712 Other spondylosis with myelopathy, cervical region: Secondary | ICD-10-CM | POA: Diagnosis not present

## 2015-06-19 DIAGNOSIS — M4802 Spinal stenosis, cervical region: Secondary | ICD-10-CM | POA: Insufficient documentation

## 2015-06-19 DIAGNOSIS — Z96643 Presence of artificial hip joint, bilateral: Secondary | ICD-10-CM | POA: Diagnosis not present

## 2015-06-19 DIAGNOSIS — Z7952 Long term (current) use of systemic steroids: Secondary | ICD-10-CM | POA: Diagnosis not present

## 2015-06-19 DIAGNOSIS — M4312 Spondylolisthesis, cervical region: Secondary | ICD-10-CM | POA: Diagnosis not present

## 2015-06-19 DIAGNOSIS — M1990 Unspecified osteoarthritis, unspecified site: Secondary | ICD-10-CM | POA: Diagnosis not present

## 2015-06-19 DIAGNOSIS — Z419 Encounter for procedure for purposes other than remedying health state, unspecified: Secondary | ICD-10-CM

## 2015-06-19 DIAGNOSIS — Z7983 Long term (current) use of bisphosphonates: Secondary | ICD-10-CM | POA: Insufficient documentation

## 2015-06-19 DIAGNOSIS — F17210 Nicotine dependence, cigarettes, uncomplicated: Secondary | ICD-10-CM | POA: Insufficient documentation

## 2015-06-19 DIAGNOSIS — M0800 Unspecified juvenile rheumatoid arthritis of unspecified site: Secondary | ICD-10-CM | POA: Diagnosis not present

## 2015-06-19 DIAGNOSIS — M5030 Other cervical disc degeneration, unspecified cervical region: Secondary | ICD-10-CM | POA: Diagnosis not present

## 2015-06-19 DIAGNOSIS — M47812 Spondylosis without myelopathy or radiculopathy, cervical region: Secondary | ICD-10-CM | POA: Diagnosis not present

## 2015-06-19 HISTORY — PX: ANTERIOR CERVICAL DECOMP/DISCECTOMY FUSION: SHX1161

## 2015-06-19 SURGERY — ANTERIOR CERVICAL DECOMPRESSION/DISCECTOMY FUSION 2 LEVELS
Anesthesia: General

## 2015-06-19 MED ORDER — SUFENTANIL CITRATE 50 MCG/ML IV SOLN
INTRAVENOUS | Status: AC
  Filled 2015-06-19: qty 1

## 2015-06-19 MED ORDER — OXYCODONE-ACETAMINOPHEN 5-325 MG PO TABS
ORAL_TABLET | ORAL | Status: AC
  Filled 2015-06-19: qty 2

## 2015-06-19 MED ORDER — CLONAZEPAM 0.5 MG PO TABS: 0.5000 mg | ORAL_TABLET | Freq: Every day | ORAL | Status: DC | PRN

## 2015-06-19 MED ORDER — ONDANSETRON HCL 4 MG PO TABS: 4.0000 mg | ORAL_TABLET | Freq: Four times a day (QID) | ORAL | Status: DC | PRN

## 2015-06-19 MED ORDER — ACETAMINOPHEN 325 MG PO TABS: 650.0000 mg | ORAL_TABLET | ORAL | Status: DC | PRN

## 2015-06-19 MED ORDER — HYDROMORPHONE HCL 1 MG/ML IJ SOLN
INTRAMUSCULAR | Status: AC
  Administered 2015-06-19: 0.5 mg via INTRAVENOUS
  Filled 2015-06-19: qty 1

## 2015-06-19 MED ORDER — SODIUM CHLORIDE 0.9 % IR SOLN
Status: DC | PRN
  Administered 2015-06-19: 500 mL

## 2015-06-19 MED ORDER — HYDROMORPHONE HCL 1 MG/ML IJ SOLN
0.2500 mg | INTRAMUSCULAR | Status: DC | PRN
  Administered 2015-06-19 (×4): 0.5 mg via INTRAVENOUS

## 2015-06-19 MED ORDER — THROMBIN 20000 UNITS EX KIT: PACK | CUTANEOUS | Status: DC | PRN

## 2015-06-19 MED ORDER — HYDROXYZINE HCL 25 MG PO TABS: 50.0000 mg | ORAL_TABLET | ORAL | Status: DC | PRN

## 2015-06-19 MED ORDER — HYDROXYZINE HCL 50 MG/ML IM SOLN: 50.0000 mg | INTRAMUSCULAR | Status: DC | PRN

## 2015-06-19 MED ORDER — SODIUM CHLORIDE 0.9 % IJ SOLN
INTRAMUSCULAR | Status: AC
  Filled 2015-06-19: qty 10

## 2015-06-19 MED ORDER — ONDANSETRON HCL 4 MG/2ML IJ SOLN: 4.0000 mg | Freq: Four times a day (QID) | INTRAMUSCULAR | Status: DC | PRN

## 2015-06-19 MED ORDER — SODIUM CHLORIDE 0.9 % IV SOLN: 250.0000 mL | INTRAVENOUS | Status: DC

## 2015-06-19 MED ORDER — ACETAMINOPHEN 10 MG/ML IV SOLN
INTRAVENOUS | Status: AC
  Administered 2015-06-19: 1000 mg via INTRAVENOUS
  Filled 2015-06-19: qty 100

## 2015-06-19 MED ORDER — DEXAMETHASONE SODIUM PHOSPHATE 10 MG/ML IJ SOLN
INTRAMUSCULAR | Status: DC | PRN
  Administered 2015-06-19: 10 mg via INTRAVENOUS

## 2015-06-19 MED ORDER — ROCURONIUM BROMIDE 100 MG/10ML IV SOLN
INTRAVENOUS | Status: DC | PRN
  Administered 2015-06-19: 30 mg via INTRAVENOUS

## 2015-06-19 MED ORDER — KETOROLAC TROMETHAMINE 15 MG/ML IJ SOLN
INTRAMUSCULAR | Status: AC
  Administered 2015-06-19: 15 mg
  Filled 2015-06-19: qty 1

## 2015-06-19 MED ORDER — ONDANSETRON HCL 4 MG/2ML IJ SOLN
INTRAMUSCULAR | Status: DC | PRN
  Administered 2015-06-19: 4 mg via INTRAVENOUS

## 2015-06-19 MED ORDER — PROPOFOL 10 MG/ML IV BOLUS
INTRAVENOUS | Status: DC | PRN
  Administered 2015-06-19: 40 mg via INTRAVENOUS
  Administered 2015-06-19: 80 mg via INTRAVENOUS

## 2015-06-19 MED ORDER — LIDOCAINE 2% (20 MG/ML) 5 ML SYRINGE
INTRAMUSCULAR | Status: AC
  Filled 2015-06-19: qty 5

## 2015-06-19 MED ORDER — OXYCODONE-ACETAMINOPHEN 5-325 MG PO TABS
1.0000 | ORAL_TABLET | ORAL | Status: DC | PRN
  Administered 2015-06-19 (×2): 2 via ORAL
  Filled 2015-06-19: qty 2

## 2015-06-19 MED ORDER — ZOLPIDEM TARTRATE 5 MG PO TABS: 5.0000 mg | ORAL_TABLET | Freq: Every evening | ORAL | Status: DC | PRN

## 2015-06-19 MED ORDER — KETOROLAC TROMETHAMINE 30 MG/ML IJ SOLN: 15.0000 mg | Freq: Once | INTRAMUSCULAR | Status: DC

## 2015-06-19 MED ORDER — LACTATED RINGERS IV SOLN
INTRAVENOUS | Status: DC | PRN
  Administered 2015-06-19 (×2): via INTRAVENOUS

## 2015-06-19 MED ORDER — BUPIVACAINE HCL (PF) 0.25 % IJ SOLN
INTRAMUSCULAR | Status: DC | PRN
  Administered 2015-06-19: 5 mL

## 2015-06-19 MED ORDER — SUFENTANIL CITRATE 50 MCG/ML IV SOLN
INTRAVENOUS | Status: DC | PRN
  Administered 2015-06-19: 20 ug via INTRAVENOUS
  Administered 2015-06-19 (×3): 10 ug via INTRAVENOUS

## 2015-06-19 MED ORDER — CYCLOBENZAPRINE HCL 10 MG PO TABS
ORAL_TABLET | ORAL | Status: AC
  Filled 2015-06-19: qty 1

## 2015-06-19 MED ORDER — ACETAMINOPHEN 650 MG RE SUPP: 650.0000 mg | RECTAL | Status: DC | PRN

## 2015-06-19 MED ORDER — KETOROLAC TROMETHAMINE 30 MG/ML IJ SOLN
15.0000 mg | Freq: Four times a day (QID) | INTRAMUSCULAR | Status: DC
  Administered 2015-06-19 – 2015-06-20 (×4): 15 mg via INTRAVENOUS
  Filled 2015-06-19 (×4): qty 1

## 2015-06-19 MED ORDER — VITAMIN C 500 MG PO TABS
500.0000 mg | ORAL_TABLET | Freq: Every day | ORAL | Status: DC
  Administered 2015-06-19: 500 mg via ORAL
  Filled 2015-06-19 (×2): qty 1

## 2015-06-19 MED ORDER — SODIUM CHLORIDE 0.9% FLUSH
3.0000 mL | Freq: Two times a day (BID) | INTRAVENOUS | Status: DC
  Administered 2015-06-19: 3 mL via INTRAVENOUS

## 2015-06-19 MED ORDER — BISACODYL 10 MG RE SUPP: 10.0000 mg | Freq: Every day | RECTAL | Status: DC | PRN

## 2015-06-19 MED ORDER — MORPHINE SULFATE (PF) 4 MG/ML IV SOLN
4.0000 mg | INTRAVENOUS | Status: DC | PRN
  Administered 2015-06-19: 4 mg via INTRAMUSCULAR
  Filled 2015-06-19: qty 1

## 2015-06-19 MED ORDER — MENTHOL 3 MG MT LOZG: 1.0000 | LOZENGE | OROMUCOSAL | Status: DC | PRN

## 2015-06-19 MED ORDER — KCL IN DEXTROSE-NACL 20-5-0.45 MEQ/L-%-% IV SOLN: INTRAVENOUS | Status: DC

## 2015-06-19 MED ORDER — LIDOCAINE-EPINEPHRINE 1 %-1:100000 IJ SOLN
INTRAMUSCULAR | Status: DC | PRN
Start: 2015-06-19 — End: 2015-06-19
  Administered 2015-06-19: 5 mL

## 2015-06-19 MED ORDER — PHENOL 1.4 % MT LIQD
1.0000 | OROMUCOSAL | Status: DC | PRN
Start: 2015-06-19 — End: 2015-06-20

## 2015-06-19 MED ORDER — METHOCARBAMOL 500 MG PO TABS: 500.0000 mg | ORAL_TABLET | Freq: Two times a day (BID) | ORAL | Status: DC | PRN

## 2015-06-19 MED ORDER — MAGNESIUM HYDROXIDE 400 MG/5ML PO SUSP: 30.0000 mL | Freq: Every day | ORAL | Status: DC | PRN

## 2015-06-19 MED ORDER — CYCLOBENZAPRINE HCL 10 MG PO TABS
10.0000 mg | ORAL_TABLET | Freq: Three times a day (TID) | ORAL | Status: DC | PRN
  Administered 2015-06-19 (×2): 10 mg via ORAL
  Filled 2015-06-19: qty 1

## 2015-06-19 MED ORDER — PREDNISONE 5 MG PO TABS
5.0000 mg | ORAL_TABLET | Freq: Every evening | ORAL | Status: DC
  Administered 2015-06-19: 5 mg via ORAL
  Filled 2015-06-19: qty 1

## 2015-06-19 MED ORDER — ROCURONIUM BROMIDE 50 MG/5ML IV SOLN
INTRAVENOUS | Status: AC
  Filled 2015-06-19: qty 1

## 2015-06-19 MED ORDER — MIDAZOLAM HCL 5 MG/5ML IJ SOLN
INTRAMUSCULAR | Status: DC | PRN
  Administered 2015-06-19: 2 mg via INTRAVENOUS

## 2015-06-19 MED ORDER — BUTALBITAL-APAP-CAFFEINE 50-325-40 MG PO TABS: 1.0000 | ORAL_TABLET | ORAL | Status: DC | PRN

## 2015-06-19 MED ORDER — HYDROCODONE-ACETAMINOPHEN 5-325 MG PO TABS: 1.0000 | ORAL_TABLET | ORAL | Status: DC | PRN

## 2015-06-19 MED ORDER — THROMBIN 20000 UNITS EX SOLR
CUTANEOUS | Status: DC | PRN
  Administered 2015-06-19: 20 mL via TOPICAL

## 2015-06-19 MED ORDER — MIDAZOLAM HCL 2 MG/2ML IJ SOLN
INTRAMUSCULAR | Status: AC
  Filled 2015-06-19: qty 2

## 2015-06-19 MED ORDER — 0.9 % SODIUM CHLORIDE (POUR BTL) OPTIME
TOPICAL | Status: DC | PRN
  Administered 2015-06-19: 1000 mL

## 2015-06-19 MED ORDER — PROPOFOL 10 MG/ML IV BOLUS
INTRAVENOUS | Status: AC
  Filled 2015-06-19: qty 20

## 2015-06-19 MED ORDER — SODIUM CHLORIDE 0.9% FLUSH: 3.0000 mL | INTRAVENOUS | Status: DC | PRN

## 2015-06-19 MED ORDER — DEXAMETHASONE SODIUM PHOSPHATE 10 MG/ML IJ SOLN
INTRAMUSCULAR | Status: AC
  Filled 2015-06-19: qty 1

## 2015-06-19 MED ORDER — LIDOCAINE 2% (20 MG/ML) 5 ML SYRINGE
INTRAMUSCULAR | Status: DC | PRN
  Administered 2015-06-19: 100 mg via INTRAVENOUS

## 2015-06-19 MED ORDER — ALUM & MAG HYDROXIDE-SIMETH 200-200-20 MG/5ML PO SUSP: 30.0000 mL | Freq: Four times a day (QID) | ORAL | Status: DC | PRN

## 2015-06-19 MED ORDER — THROMBIN 5000 UNITS EX SOLR
OROMUCOSAL | Status: DC | PRN
  Administered 2015-06-19: 08:00:00 via TOPICAL

## 2015-06-19 MED FILL — IBANDRONATE NA 150 MG TAB: 150 | 90 days supply | Qty: 3 | Fill #0

## 2015-06-19 SURGICAL SUPPLY — 59 items
ALLOGRAFT CA 6X14X11 (Bone Implant) ×4 IMPLANT
BAG DECANTER FOR FLEXI CONT (MISCELLANEOUS) ×2 IMPLANT
BIT DRILL NEURO 2X3.1 SFT TUCH (MISCELLANEOUS) ×1 IMPLANT
BLADE ULTRA TIP 2M (BLADE) ×2 IMPLANT
BRUSH SCRUB EZ PLAIN DRY (MISCELLANEOUS) ×2 IMPLANT
CANISTER SUCT 3000ML PPV (MISCELLANEOUS) ×2 IMPLANT
COVER MAYO STAND STRL (DRAPES) ×2 IMPLANT
DECANTER SPIKE VIAL GLASS SM (MISCELLANEOUS) ×2 IMPLANT
DERMABOND ADHESIVE PROPEN (GAUZE/BANDAGES/DRESSINGS) ×1
DERMABOND ADVANCED (GAUZE/BANDAGES/DRESSINGS) ×1
DERMABOND ADVANCED .7 DNX12 (GAUZE/BANDAGES/DRESSINGS) ×1 IMPLANT
DERMABOND ADVANCED .7 DNX6 (GAUZE/BANDAGES/DRESSINGS) ×1 IMPLANT
DRAPE LAPAROTOMY 100X72 PEDS (DRAPES) ×2 IMPLANT
DRAPE MICROSCOPE LEICA (MISCELLANEOUS) ×2 IMPLANT
DRAPE POUCH INSTRU U-SHP 10X18 (DRAPES) ×2 IMPLANT
DRAPE PROXIMA HALF (DRAPES) IMPLANT
DRILL NEURO 2X3.1 SOFT TOUCH (MISCELLANEOUS) ×2
ELECT COATED BLADE 2.86 ST (ELECTRODE) ×2 IMPLANT
ELECT REM PT RETURN 9FT ADLT (ELECTROSURGICAL) ×2
ELECTRODE REM PT RTRN 9FT ADLT (ELECTROSURGICAL) ×1 IMPLANT
GLOVE BIOGEL PI IND STRL 6.5 (GLOVE) ×1 IMPLANT
GLOVE BIOGEL PI IND STRL 7.5 (GLOVE) ×2 IMPLANT
GLOVE BIOGEL PI IND STRL 8 (GLOVE) ×1 IMPLANT
GLOVE BIOGEL PI INDICATOR 6.5 (GLOVE) ×1
GLOVE BIOGEL PI INDICATOR 7.5 (GLOVE) ×2
GLOVE BIOGEL PI INDICATOR 8 (GLOVE) ×1
GLOVE ECLIPSE 7.5 STRL STRAW (GLOVE) ×2 IMPLANT
GLOVE EXAM NITRILE LRG STRL (GLOVE) IMPLANT
GLOVE EXAM NITRILE MD LF STRL (GLOVE) IMPLANT
GLOVE EXAM NITRILE XL STR (GLOVE) IMPLANT
GLOVE EXAM NITRILE XS STR PU (GLOVE) IMPLANT
GLOVE SS BIOGEL STRL SZ 7 (GLOVE) ×1 IMPLANT
GLOVE SUPERSENSE BIOGEL SZ 7 (GLOVE) ×1
GLOVE SURG SS PI 6.5 STRL IVOR (GLOVE) ×4 IMPLANT
GOWN STRL REUS W/ TWL LRG LVL3 (GOWN DISPOSABLE) ×2 IMPLANT
GOWN STRL REUS W/ TWL XL LVL3 (GOWN DISPOSABLE) ×2 IMPLANT
GOWN STRL REUS W/TWL 2XL LVL3 (GOWN DISPOSABLE) IMPLANT
GOWN STRL REUS W/TWL LRG LVL3 (GOWN DISPOSABLE) ×2
GOWN STRL REUS W/TWL XL LVL3 (GOWN DISPOSABLE) ×2
HALTER HD/CHIN CERV TRACTION D (MISCELLANEOUS) ×2 IMPLANT
HEMOSTAT POWDER KIT SURGIFOAM (HEMOSTASIS) ×2 IMPLANT
KIT BASIN OR (CUSTOM PROCEDURE TRAY) ×2 IMPLANT
KIT ROOM TURNOVER OR (KITS) ×2 IMPLANT
NEEDLE HYPO 25X1 1.5 SAFETY (NEEDLE) ×2 IMPLANT
NEEDLE SPNL 22GX3.5 QUINCKE BK (NEEDLE) ×4 IMPLANT
NS IRRIG 1000ML POUR BTL (IV SOLUTION) ×2 IMPLANT
PACK LAMINECTOMY NEURO (CUSTOM PROCEDURE TRAY) ×2 IMPLANT
PAD ARMBOARD 7.5X6 YLW CONV (MISCELLANEOUS) ×6 IMPLANT
PLATE AVIATOR ASSY 2LVL SZ 24 (Plate) ×2 IMPLANT
RUBBERBAND STERILE (MISCELLANEOUS) ×4 IMPLANT
SCREW AVIATOR VAR SELFTAP 4X12 (Screw) ×12 IMPLANT
SPONGE INTESTINAL PEANUT (DISPOSABLE) ×2 IMPLANT
SPONGE SURGIFOAM ABS GEL 100 (HEMOSTASIS) ×2 IMPLANT
STAPLER SKIN PROX WIDE 3.9 (STAPLE) ×2 IMPLANT
SUT VIC AB 2-0 CP2 18 (SUTURE) ×2 IMPLANT
SUT VIC AB 3-0 SH 8-18 (SUTURE) ×4 IMPLANT
TOWEL OR 17X24 6PK STRL BLUE (TOWEL DISPOSABLE) ×2 IMPLANT
TOWEL OR 17X26 10 PK STRL BLUE (TOWEL DISPOSABLE) ×2 IMPLANT
WATER STERILE IRR 1000ML POUR (IV SOLUTION) ×2 IMPLANT

## 2015-06-19 NOTE — Op Note (Signed)
06/19/2015  9:33 AM  PATIENT:  Anne Garrett  40 y.o. female  PRE-OPERATIVE DIAGNOSIS:  Cervical spondylosis with myelopathy and radiculopathy, spondylytic cervical disc herniation with myelopathy and radiculopathy, cervical degenerative disc disease, multilevel cervical spondylolisthesis  POST-OPERATIVE DIAGNOSIS:  Cervical spondylosis with myelopathy and radiculopathy, spondylytic cervical disc herniation with myelopathy and radiculopathy, cervical degenerative disc disease, multilevel cervical spondylolisthesis  PROCEDURE:  Procedure(s):  C3-4 and C4-5 anterior cervical decompression and arthrodesis with structural allograft and anterior cervical plating  SURGEON:  Surgeon(s): Jovita Gamma, MD Tamala Fothergill, MD  ASSISTANTS: Melodie Bouillon, M.D.  ANESTHESIA:   general  EBL:  Total I/O In: 1000 [I.V.:1000] Out: -  EBL per anesthesia service: 50 cc  BLOOD ADMINISTERED:none  COUNT: Correct per nursing staff  DICTATION: Patient was brought to the operating room placed under general endotracheal anesthesia. Patient was placed in 10 pounds of halter traction. The neck was prepped with Betadine soap and solution and draped in a sterile fashion. A horizontal incision was made on the left side of the neck. The line of the incision was infiltrated with local anesthetic with epinephrine. Dissection was carried down thru the subcutaneous tissue and platysma, bipolar cautery was used to maintain hemostasis. Dissection was then carried out thru an avascular plane leaving the sternocleidomastoid carotid artery and jugular vein laterally and the trachea and esophagus medially. The ventral aspect of the vertebral column was identified and a localizing x-ray was taken. The C3-4 and C4-5 levels were identified. The annulus at each level was incised and the disc space entered. Discectomy was performed with micro-curettes and pituitary rongeurs. The operating microscope was draped and brought into  the field provided additional magnification illumination and visualization. Discectomy was continued posteriorly thru the disc space and then the cartilaginous endplate was removed using micro-curettes along with the high-speed drill. Posterior osteophytic overgrowth was removed each level using the high-speed drill along with a 2 mm thin footplated Kerrison punch. Posterior longitudinal ligament along with disc herniation was carefully removed, decompressing the spinal canal and thecal sac. We then continued to remove osteophytic overgrowth and disc material decompressing the neural foramina and exiting nerve roots bilaterally. Once the decompression was completed hemostasis was established at each level with the use of Gelfoam with thrombin and bipolar cautery. The Gelfoam was removed the wound irrigated, a thin layer of Surgifoam was applied, and hemostasis confirmed. We then measured the height of the intravertebral disc space level and selected a 6 millimeter in height structural allograft for the C3-4 level and a 6 millimeter in height structural allograft for the C4-5 level . Each was hydrated and saline solution and then gently positioned in the intravertebral disc space and countersunk. We then selected a 24 millimeter in height Aviator cervical plate. It was positioned over the fusion construct and secured to the vertebra with a pair of 4 x 14 mm self-tapping screws at each level. Each screw hole was started with the high-speed drill and then the screws placed, once all the screws were placed, the locking system was secured. The wound was irrigated with bacitracin solution checked for hemostasis which was established and confirmed. An x-ray was taken which showed the grafts in good position, the plate and screws in good position, and the overall alignment to be good. We then proceeded with closure. The platysma was closed with interrupted inverted 2-0 undyed Vicryl suture, the subcutaneous and subcuticular  closed with interrupted inverted 3-0 undyed Vicryl suture. The skin edges were approximated with Dermabond.  Following surgery the patient was taken out of cervical traction. To be reversed and the anesthetic and taken to the recovery room for further care. In the recovery room the patient was noted to be moving all 4 extremities to command.  PLAN OF CARE: Admit for overnight observation  PATIENT DISPOSITION:  PACU - hemodynamically stable.   Delay start of Pharmacological VTE agent (>24hrs) due to surgical blood loss or risk of bleeding:  yes

## 2015-06-19 NOTE — H&P (Signed)
Subjective: Patient is a 40 y.o. right-handed white female with a history of juvenile rheumatoid arthritis who is admitted for treatment of advanced degeneration at the C3-4 and C4-5 levels with spondylosis, degenerative disease, stenosis, and spinal cord impingement. There is significant anterolisthesis of C3 on 4 and C4 and 5. Patient admitted now for C3-4 and C4-5 anterior cervical decompression and arthrodesis with structural allograft and cervical plating. Symptomatically she's been having increasing difficulties with neck pain as well as numbness and tingling in her fingertips and feet, but also has had several syncopal episodes..    Patient Active Problem List   Diagnosis Date Noted  . Numbness 05/20/2015  . Chronic rheumatic arthritis ([place]) 05/20/2015  . Carpal tunnel syndrome 05/20/2015  . Snoring 05/20/2015  . Excessive daytime sleepiness 05/20/2015   Past Medical History  Diagnosis Date  . Arthritis   . Intervertebral disk disease   . Osteoporosis   . Adrenal insufficiency ([place])   . Heart murmur     only hear once slight  . Headache     Past Surgical History  Procedure Laterality Date  . Appendectomy    . Cholecystectomy    . Abdominal surgery    . Essure tubal ligation      Essure Implant-Permanent birth control  . Joint replacement      bilat hip replacements  . Carpal tunnel release Right   . Cesarean section    . Ankle arthroplasty Right   . Orif femur fracture Right   . Wrist surgery Bilateral     arthrodesis  . Excision to palm Right     excision of mass to right plam  . Right foot surgery Right     right jfoot arthrodesis and right ankle arthroplasty    Prescriptions prior to admission  Medication Sig Dispense Refill Last Dose  . butalbital-acetaminophen-caffeine (FIORICET, ESGIC) 50-325-40 MG tablet Take 1 tablet by mouth as needed for migraine.    Past Month at Unknown time  . Calcium Carb-Cholecalciferol [phone] MG-UNIT CAPS Take 1 tablet by mouth  daily.   Past Week at Unknown time  . clonazePAM (KLONOPIN) 1 MG tablet Take 0.5 mg by mouth daily as needed for anxiety.   Past Month at Unknown time  . ibandronate (BONIVA) 150 MG tablet Take 150 mg by mouth every 30 (thirty) days. Take in the morning with a full glass of water, on an empty stomach, and do not take anything else by mouth or lie down for the next 30 min.   Past Month at Unknown time  . methocarbamol (ROBAXIN) 500 MG tablet Take 500 mg by mouth 2 (two) times daily as needed for muscle spasms.   Past Week at Unknown time  . Multiple Vitamin (MULTIVITAMIN) tablet Take 1 tablet by mouth daily.   Past Week at Unknown time  . oxyCODONE-acetaminophen (PERCOCET/ROXICET) 5-325 MG tablet Take 1 tablet by mouth every 6 (six) hours as needed for moderate pain.    Past Week at Unknown time  . predniSONE (DELTASONE) 5 MG tablet Take 5 mg by mouth every evening.    06/18/2015 at Unknown time  . triamcinolone cream (KENALOG) 0.1 % Apply 1 application topically daily as needed (for eczema).    Past Month at Unknown time  . vitamin C (ASCORBIC ACID) 500 MG tablet Take 500 mg by mouth daily.   Past Week at Unknown time  . Vitamin D, Ergocalciferol, (DRISDOL) 50000 units CAPS capsule Take 50,000 Units by mouth every 30 (thirty) days. Takes  with Boniva   Past Month at Unknown time   Allergies  Allergen Reactions  . Teriparatide Other (See Comments)    Severe Muscle Pain, [place]  . Leflunomide Diarrhea  . Methotrexate Diarrhea and Other (See Comments)    Fatigue  . Paroxetine Hcl Diarrhea  . Sulfa Antibiotics Itching and Rash    Head to toe  . Valproic Acid And Related Rash    Head to toe    Social History  Substance Use Topics  . Smoking status: Current Every Day Smoker -- 0.25 packs/day  . Smokeless tobacco: Not on file  . Alcohol Use: Yes     Comment: social    Family History  Problem Relation Age of Onset  . Breast cancer Mother 55  . Breast cancer Maternal Aunt     2 mat aunts   .  Nephritis Father   . Epilepsy Father   . Multiple sclerosis Maternal Uncle   . Psoriasis Paternal Aunt      Review of Systems A comprehensive review of systems was negative.  Objective: Vital signs in last 24 hours: Pulse Rate:  [72] 72 (06/15 0630) Resp:  [18] 18 (06/15 0630) BP: (97)/(77) 97/77 mmHg (06/15 0630) SpO2:  [97 %] 97 % (06/15 0630) Weight:  [41.901 kg (92 lb 6 oz)] 41.901 kg (92 lb 6 oz) (06/15 0630)  EXAM: Patient is a slight white female in no acute distress. Lungs are clear to auscultation , the patient has symmetrical respiratory excursion. Heart has a regular rate and rhythm normal S1 and S2 no murmur.   Abdomen is soft nontender nondistended bowel sounds are present. Extremity examination shows no clubbing cyanosis or edema. Motor examination shows 5 over 5 strength in the upper extremities including the deltoid biceps triceps and intrinsics and grip. Sensory examination shows somewhat decreased pinprick in the first, second, and fifth digits of the hands bilaterally. Reflexes are symmetrical and without evidence of pathologic reflexes. Patient has a normal gait and stance.   Data Review:CBC    Component Value Date/Time   WBC 7.3 06/12/2015 1558   RBC 3.82* 06/12/2015 1558   HGB 12.3 06/12/2015 1558   HCT 39.2 06/12/2015 1558   PLT 316 06/12/2015 1558   MCV 102.6* 06/12/2015 1558   MCH 32.2 06/12/2015 1558   MCHC 31.4 06/12/2015 1558   RDW 13.1 06/12/2015 1558   LYMPHSABS 2.9 05/15/2015 1013   MONOABS 0.7 05/15/2015 1013   EOSABS 0.1 05/15/2015 1013   BASOSABS 0.0 05/15/2015 1013                          BMET    Component Value Date/Time   NA 138 06/12/2015 1558   K 3.8 06/12/2015 1558   CL 104 06/12/2015 1558   CO2 26 06/12/2015 1558   GLUCOSE 89 06/12/2015 1558   BUN 5* 06/12/2015 1558   CREATININE 0.68 06/12/2015 1558   CALCIUM 9.1 06/12/2015 1558   GFRNONAA >60 06/12/2015 1558   GFRAA >60 06/12/2015 1558     Assessment/Plan: Patient with  increasing cervical symptoms, with advanced degeneration at the C3-4 and C4-5 levels was admitted now for 2 level CIII-4 and C4-5 ACDF.  I've discussed with the patient the nature of his condition, the nature the surgical procedure, the typical length of surgery, hospital stay, and overall recuperation. We discussed limitations postoperatively. I discussed risks of surgery including risks of infection, bleeding, possibly need for transfusion, the risk of nerve  root dysfunction with pain, weakness, numbness, or paresthesias, the risk of spinal cord dysfunction with paralysis of all 4 limbs and quadriplegia, and the risk of dural tear and CSF leakage and possible need for further surgery, the risk of esophageal dysfunction causing dysphagia and the risk of laryngeal dysfunction causing hoarseness of the voice, the risk of failure of the arthrodesis and the possible need for further surgery, and the risk of anesthetic complications including myocardial infarction, stroke, pneumonia, and death. We also discussed the need for postoperative immobilization in a cervical collar. Understanding all this the patient does wish to proceed with surgery and is admitted for such.    Hosie Spangle, MD 06/19/2015 7:14 AM

## 2015-06-19 NOTE — Transfer of Care (Signed)
Immediate Anesthesia Transfer of Care Note  Patient: Anne Garrett  Procedure(s) Performed: Procedure(s) with comments: C3-4 C4-5 Anterior cervical decompression/diskectomy/fusion (N/A) - C3-4 C4-5 Anterior cervical decompression/diskectomy/fusion  Patient Location: PACU  Anesthesia Type:General  Level of Consciousness: awake, alert  and patient cooperative  Airway & Oxygen Therapy: Patient Spontanous Breathing and Patient connected to nasal cannula oxygen  Post-op Assessment: Report given to RN, Post -op Vital signs reviewed and stable and Patient moving all extremities  Post vital signs: Reviewed and stable  Last Vitals:  Filed Vitals:   06/19/15 0630 06/19/15 0940  BP: 97/77   Pulse: 72   Temp:  36.1 C  Resp: 18 22    Last Pain:  Filed Vitals:   06/19/15 0942  PainSc: 4       Patients Stated Pain Goal: 3 ([id number])  Complications: No apparent anesthesia complications

## 2015-06-19 NOTE — Anesthesia Postprocedure Evaluation (Signed)
Anesthesia Post Note  Patient: Anne Garrett  Procedure(s) Performed: Procedure(s) (LRB): C3-4 C4-5 Anterior cervical decompression/diskectomy/fusion (N/A)  Patient location during evaluation: PACU Anesthesia Type: General Level of consciousness: sedated Pain management: satisfactory to patient Vital Signs Assessment: post-procedure vital signs reviewed and stable Respiratory status: spontaneous breathing Cardiovascular status: stable Anesthetic complications: no    Last Vitals:  Filed Vitals:   06/19/15 1045 06/19/15 1100  BP:  133/83  Pulse: 82 77  Temp: 36.7 C 36.6 C  Resp: 20 20    Last Pain:  Filed Vitals:   06/19/15 1158  PainSc: 2                  Shamon Lobo EDWARD

## 2015-06-19 NOTE — Anesthesia Procedure Notes (Signed)
Procedure Name: Intubation Date/Time: 06/19/2015 7:34 AM Performed by: Melina Copa, Perian Tedder R Pre-anesthesia Checklist: Patient identified, Emergency Drugs available, Suction available and Patient being monitored Patient Re-evaluated:Patient Re-evaluated prior to inductionOxygen Delivery Method: Circle System Utilized Preoxygenation: Pre-oxygenation with 100% oxygen Intubation Type: IV induction Ventilation: Mask ventilation without difficulty Laryngoscope Size: Mac, 3 and Glidescope Grade View: Grade IV Tube type: Oral Tube size: 7.0 mm Number of attempts: 2 Airway Equipment and Method: Stylet,  Oral airway,  Rigid stylet and Video-laryngoscopy (Grade IV view with Mac 3, Glide scope with Grade II view, AOI) Placement Confirmation: ETT inserted through vocal cords under direct vision,  positive ETCO2 and breath sounds checked- equal and bilateral Secured at: 20 cm Tube secured with: Tape Dental Injury: Teeth and Oropharynx as per pre-operative assessment

## 2015-06-19 NOTE — Progress Notes (Signed)
Filed Vitals:   06/19/15 1015 06/19/15 1030 06/19/15 1045 06/19/15 1100  BP: 122/77 121/68  133/83  Pulse: 70 74 82 77  Temp:   98 F (36.7 C) 97.9 F (36.6 C)  Resp: 16 15 20 20   Height:      Weight:      SpO2: 100% 100% 100% 98%    Patient resting in bed, has been up and ambulating in the halls with the staff. Voiding well. Incision clean and dry. No swelling, erythema, ecchymosis, or drainage.  Plan: Encouraged to ambulate in the halls. Continue to progress through postoperative recovery.  Hosie Spangle, MD 06/19/2015, 5:41 PM

## 2015-06-20 ENCOUNTER — Encounter (HOSPITAL_COMMUNITY): Payer: Self-pay | Admitting: Neurosurgery

## 2015-06-20 DIAGNOSIS — M4312 Spondylolisthesis, cervical region: Secondary | ICD-10-CM | POA: Diagnosis not present

## 2015-06-20 DIAGNOSIS — M5001 Cervical disc disorder with myelopathy,  high cervical region: Secondary | ICD-10-CM | POA: Diagnosis not present

## 2015-06-20 DIAGNOSIS — M0800 Unspecified juvenile rheumatoid arthritis of unspecified site: Secondary | ICD-10-CM | POA: Diagnosis not present

## 2015-06-20 DIAGNOSIS — M5011 Cervical disc disorder with radiculopathy,  high cervical region: Secondary | ICD-10-CM | POA: Diagnosis not present

## 2015-06-20 DIAGNOSIS — M4722 Other spondylosis with radiculopathy, cervical region: Secondary | ICD-10-CM | POA: Diagnosis not present

## 2015-06-20 DIAGNOSIS — Z7983 Long term (current) use of bisphosphonates: Secondary | ICD-10-CM | POA: Diagnosis not present

## 2015-06-20 DIAGNOSIS — M4802 Spinal stenosis, cervical region: Secondary | ICD-10-CM | POA: Diagnosis not present

## 2015-06-20 DIAGNOSIS — Z96643 Presence of artificial hip joint, bilateral: Secondary | ICD-10-CM | POA: Diagnosis not present

## 2015-06-20 DIAGNOSIS — M4712 Other spondylosis with myelopathy, cervical region: Secondary | ICD-10-CM | POA: Diagnosis not present

## 2015-06-20 MED ORDER — HYDROCODONE-ACETAMINOPHEN 5-325 MG PO TABS: 1.0000 | ORAL_TABLET | ORAL | Status: DC | PRN

## 2015-06-20 MED FILL — HYDROCODON-APAP 5-325: 5-325 | 4 days supply | Qty: 50 | Fill #0

## 2015-06-20 NOTE — Discharge Summary (Signed)
Physician Discharge Summary  Patient ID: Anne Garrett MRN: [id number] DOB/AGE: [birth year] 40 y.o.  Admit date: 06/19/2015 Discharge date: 06/20/2015  Admission Diagnoses:  Cervical spondylosis with myelopathy and radiculopathy, spondylytic cervical disc herniation with myelopathy and radiculopathy, cervical degenerative disc disease, multilevel cervical spondylolisthesis  Discharge Diagnoses:  Cervical spondylosis with myelopathy and radiculopathy, spondylytic cervical disc herniation with myelopathy and radiculopathy, cervical degenerative disc disease, multilevel cervical spondylolisthesis Active Problems:   HNP (herniated nucleus pulposus), cervical   Discharged Condition: good  Hospital Course: Patient was admitted, underwent a 2 level CIII-4 and C4-5 ACDF. She is done well following surgery. She is up and ambulating actively. Her wound is healing nicely. We are discharging her to home with instructions regarding wound care and activities. She is scheduled to return for follow-up with me in about 3 weeks.  Discharge Exam: Blood pressure 114/70, pulse 52, temperature 98.3 F (36.8 C), temperature source Oral, resp. rate 18, height 5' (1.524 m), weight 41.901 kg (92 lb 6 oz), last menstrual period 06/01/2015, SpO2 100 %.  Disposition: 01-Home or Self Care     Medication List    TAKE these medications        butalbital-acetaminophen-caffeine 50-325-40 MG tablet  Commonly known as:  FIORICET, ESGIC  Take 1 tablet by mouth as needed for migraine.     Calcium Carb-Cholecalciferol [phone] MG-UNIT Caps  Take 1 tablet by mouth daily.     clonazePAM 1 MG tablet  Commonly known as:  KLONOPIN  Take 0.5 mg by mouth daily as needed for anxiety.     HYDROcodone-acetaminophen 5-325 MG tablet  Commonly known as:  NORCO/VICODIN  Take 1-2 tablets by mouth every 4 (four) hours as needed (mild pain).     ibandronate 150 MG tablet  Commonly known as:  BONIVA  Take 150 mg by mouth every  30 (thirty) days. Take in the morning with a full glass of water, on an empty stomach, and do not take anything else by mouth or lie down for the next 30 min.     methocarbamol 500 MG tablet  Commonly known as:  ROBAXIN  Take 500 mg by mouth 2 (two) times daily as needed for muscle spasms.     multivitamin tablet  Take 1 tablet by mouth daily.     oxyCODONE-acetaminophen 5-325 MG tablet  Commonly known as:  PERCOCET/ROXICET  Take 1 tablet by mouth every 6 (six) hours as needed for moderate pain.     predniSONE 5 MG tablet  Commonly known as:  DELTASONE  Take 5 mg by mouth every evening.     triamcinolone cream 0.1 %  Commonly known as:  KENALOG  Apply 1 application topically daily as needed (for eczema).     vitamin C 500 MG tablet  Commonly known as:  ASCORBIC ACID  Take 500 mg by mouth daily.     Vitamin D (Ergocalciferol) 50000 units Caps capsule  Commonly known as:  DRISDOL  Take 50,000 Units by mouth every 30 (thirty) days. Takes with Jaclyn Prime         SignedHosie Spangle 06/20/2015, 8:08 AM

## 2015-06-20 NOTE — Discharge Instructions (Signed)

## 2015-06-20 NOTE — Progress Notes (Signed)
Pt and family given D/C instructions with Rx, verbal understanding was provided. Pt's incision is clean and dry with no sign of infection. Pt's IV was removed prior to D/C. Pt D/C'd home via walking @ 0950 per MD order. Pt is stable @ D/C and has no other needs at this time. Holli Humbles, RN

## 2015-06-23 DIAGNOSIS — M4712 Other spondylosis with myelopathy, cervical region: Secondary | ICD-10-CM | POA: Diagnosis not present

## 2015-06-23 DIAGNOSIS — R030 Elevated blood-pressure reading, without diagnosis of hypertension: Secondary | ICD-10-CM | POA: Diagnosis not present

## 2015-06-23 DIAGNOSIS — M542 Cervicalgia: Secondary | ICD-10-CM | POA: Diagnosis not present

## 2015-06-23 DIAGNOSIS — M5030 Other cervical disc degeneration, unspecified cervical region: Secondary | ICD-10-CM | POA: Diagnosis not present

## 2015-06-23 DIAGNOSIS — Z981 Arthrodesis status: Secondary | ICD-10-CM | POA: Diagnosis not present

## 2015-06-23 DIAGNOSIS — M4312 Spondylolisthesis, cervical region: Secondary | ICD-10-CM | POA: Diagnosis not present

## 2015-06-27 MED FILL — HYDROCODON-APAP 5-325: 5-325 | 5 days supply | Qty: 60 | Fill #0

## 2015-06-27 MED FILL — predniSONE 5 MG TABS: 5 | 90 days supply | Qty: 90 | Fill #0

## 2015-06-27 MED FILL — CYCLOBENZAPRINE 10 MG TAB: 10 | 30 days supply | Qty: 90 | Fill #0

## 2015-07-02 DIAGNOSIS — Z981 Arthrodesis status: Secondary | ICD-10-CM | POA: Diagnosis not present

## 2015-07-02 DIAGNOSIS — M5030 Other cervical disc degeneration, unspecified cervical region: Secondary | ICD-10-CM | POA: Diagnosis not present

## 2015-07-02 DIAGNOSIS — M4712 Other spondylosis with myelopathy, cervical region: Secondary | ICD-10-CM | POA: Diagnosis not present

## 2015-07-09 DIAGNOSIS — M4712 Other spondylosis with myelopathy, cervical region: Secondary | ICD-10-CM | POA: Diagnosis not present

## 2015-07-09 DIAGNOSIS — M4802 Spinal stenosis, cervical region: Secondary | ICD-10-CM | POA: Diagnosis not present

## 2015-07-11 ENCOUNTER — Encounter (HOSPITAL_COMMUNITY): Payer: Self-pay | Admitting: *Deleted

## 2015-07-11 ENCOUNTER — Other Ambulatory Visit: Payer: Self-pay | Admitting: Neurosurgery

## 2015-07-12 ENCOUNTER — Inpatient Hospital Stay (HOSPITAL_COMMUNITY): Payer: 59 | Admitting: Anesthesiology

## 2015-07-12 ENCOUNTER — Observation Stay (HOSPITAL_COMMUNITY): Payer: 59

## 2015-07-12 ENCOUNTER — Encounter (HOSPITAL_COMMUNITY)
Admission: RE | Disposition: A | Discharge status: Home / Self Care | Payer: Self-pay | Source: Ambulatory Visit | Attending: Neurosurgery

## 2015-07-12 ENCOUNTER — Encounter (HOSPITAL_COMMUNITY): Payer: Self-pay | Admitting: *Deleted

## 2015-07-12 ENCOUNTER — Inpatient Hospital Stay (HOSPITAL_COMMUNITY)
Admission: RE | Admit: 2015-07-12 | Discharge: 2015-07-13 | DRG: 472 | Disposition: A | Discharge status: Home / Self Care | Payer: 59 | Source: Ambulatory Visit | Attending: Neurological Surgery | Admitting: Neurological Surgery

## 2015-07-12 DIAGNOSIS — M5001 Cervical disc disorder with myelopathy,  high cervical region: Secondary | ICD-10-CM | POA: Diagnosis not present

## 2015-07-12 DIAGNOSIS — Z96643 Presence of artificial hip joint, bilateral: Secondary | ICD-10-CM | POA: Diagnosis present

## 2015-07-12 DIAGNOSIS — Z981 Arthrodesis status: Secondary | ICD-10-CM | POA: Diagnosis not present

## 2015-07-12 DIAGNOSIS — M4712 Other spondylosis with myelopathy, cervical region: Secondary | ICD-10-CM | POA: Diagnosis not present

## 2015-07-12 DIAGNOSIS — Z888 Allergy status to other drugs, medicaments and biological substances status: Secondary | ICD-10-CM | POA: Diagnosis not present

## 2015-07-12 DIAGNOSIS — E273 Drug-induced adrenocortical insufficiency: Secondary | ICD-10-CM | POA: Diagnosis present

## 2015-07-12 DIAGNOSIS — Z882 Allergy status to sulfonamides status: Secondary | ICD-10-CM | POA: Diagnosis not present

## 2015-07-12 DIAGNOSIS — M0808 Unspecified juvenile rheumatoid arthritis, vertebrae: Secondary | ICD-10-CM | POA: Diagnosis present

## 2015-07-12 DIAGNOSIS — M50021 Cervical disc disorder at C4-C5 level with myelopathy: Secondary | ICD-10-CM | POA: Diagnosis not present

## 2015-07-12 DIAGNOSIS — Z7952 Long term (current) use of systemic steroids: Secondary | ICD-10-CM | POA: Diagnosis not present

## 2015-07-12 DIAGNOSIS — Z419 Encounter for procedure for purposes other than remedying health state, unspecified: Secondary | ICD-10-CM

## 2015-07-12 DIAGNOSIS — M810 Age-related osteoporosis without current pathological fracture: Secondary | ICD-10-CM | POA: Diagnosis present

## 2015-07-12 DIAGNOSIS — Z87891 Personal history of nicotine dependence: Secondary | ICD-10-CM

## 2015-07-12 DIAGNOSIS — M4802 Spinal stenosis, cervical region: Secondary | ICD-10-CM | POA: Diagnosis not present

## 2015-07-12 DIAGNOSIS — Z79899 Other long term (current) drug therapy: Secondary | ICD-10-CM

## 2015-07-12 DIAGNOSIS — T380X5A Adverse effect of glucocorticoids and synthetic analogues, initial encounter: Secondary | ICD-10-CM | POA: Diagnosis present

## 2015-07-12 DIAGNOSIS — R202 Paresthesia of skin: Secondary | ICD-10-CM | POA: Diagnosis present

## 2015-07-12 HISTORY — DX: Dermatitis, unspecified: L30.9

## 2015-07-12 HISTORY — DX: Rheumatoid arthritis, unspecified: M06.9

## 2015-07-12 HISTORY — PX: POSTERIOR CERVICAL FUSION/FORAMINOTOMY: SHX5038

## 2015-07-12 LAB — CBC
HCT: 37.6 % (ref 36.0–46.0)
Hemoglobin: 12.1 g/dL (ref 12.0–15.0)
MCH: 32.5 pg (ref 26.0–34.0)
MCHC: 32.2 g/dL (ref 30.0–36.0)
MCV: 101.1 fL — ABNORMAL HIGH (ref 78.0–100.0)
Platelets: 346 10*3/uL (ref 150–400)
RBC: 3.72 MIL/uL — ABNORMAL LOW (ref 3.87–5.11)
RDW: 12.9 % (ref 11.5–15.5)
WBC: 5.3 10*3/uL (ref 4.0–10.5)

## 2015-07-12 LAB — BASIC METABOLIC PANEL
Anion gap: 7 (ref 5–15)
BUN: 6 mg/dL (ref 6–20)
CO2: 23 mmol/L (ref 22–32)
Calcium: 8.2 mg/dL — ABNORMAL LOW (ref 8.9–10.3)
Chloride: 109 mmol/L (ref 101–111)
Creatinine, Ser: 0.43 mg/dL — ABNORMAL LOW (ref 0.44–1.00)
GFR calc Af Amer: >60 mL/min (ref >60)
GFR calc non Af Amer: >60 mL/min (ref >60)
Glucose, Bld: 116 mg/dL — ABNORMAL HIGH (ref 65–99)
Potassium: 4.1 mmol/L (ref 3.5–5.1)
Sodium: 139 mmol/L (ref 135–145)

## 2015-07-12 LAB — HCG, SERUM, QUALITATIVE: Preg, Serum: NEGATIVE

## 2015-07-12 SURGERY — POSTERIOR CERVICAL FUSION/FORAMINOTOMY LEVEL 3
Anesthesia: General | Site: Neck

## 2015-07-12 MED ORDER — HYDROXYZINE HCL 25 MG PO TABS: 50.0000 mg | ORAL_TABLET | ORAL | Status: DC | PRN

## 2015-07-12 MED ORDER — MIDAZOLAM HCL 2 MG/2ML IJ SOLN
0.5000 mg | INTRAMUSCULAR | Status: AC | PRN
  Administered 2015-07-12 (×2): 0.5 mg via INTRAVENOUS

## 2015-07-12 MED ORDER — PREDNISONE 5 MG PO TABS
5.0000 mg | ORAL_TABLET | Freq: Every evening | ORAL | Status: DC
  Administered 2015-07-12: 5 mg via ORAL
  Filled 2015-07-12: qty 1

## 2015-07-12 MED ORDER — SODIUM CHLORIDE 0.9% FLUSH
3.0000 mL | Freq: Two times a day (BID) | INTRAVENOUS | Status: DC
  Administered 2015-07-12 (×2): 3 mL via INTRAVENOUS

## 2015-07-12 MED ORDER — ZOLPIDEM TARTRATE 5 MG PO TABS: 5.0000 mg | ORAL_TABLET | Freq: Every evening | ORAL | Status: DC | PRN

## 2015-07-12 MED ORDER — CEFAZOLIN SODIUM-DEXTROSE 2-4 GM/100ML-% IV SOLN
INTRAVENOUS | Status: AC
  Filled 2015-07-12: qty 100

## 2015-07-12 MED ORDER — CYCLOBENZAPRINE HCL 10 MG PO TABS
10.0000 mg | ORAL_TABLET | Freq: Three times a day (TID) | ORAL | Status: DC | PRN
  Administered 2015-07-12 (×2): 10 mg via ORAL
  Filled 2015-07-12: qty 1

## 2015-07-12 MED ORDER — THROMBIN 20000 UNITS EX SOLR
CUTANEOUS | Status: DC | PRN
  Administered 2015-07-12: 20 mL via TOPICAL

## 2015-07-12 MED ORDER — BUPIVACAINE HCL (PF) 0.5 % IJ SOLN
INTRAMUSCULAR | Status: DC | PRN
  Administered 2015-07-12: 11 mL

## 2015-07-12 MED ORDER — SODIUM CHLORIDE 0.9 % IV SOLN: 250.0000 mL | INTRAVENOUS | Status: DC

## 2015-07-12 MED ORDER — LACTATED RINGERS IV SOLN
INTRAVENOUS | Status: DC
  Administered 2015-07-12 (×3): via INTRAVENOUS

## 2015-07-12 MED ORDER — KCL IN DEXTROSE-NACL 20-5-0.45 MEQ/L-%-% IV SOLN
INTRAVENOUS | Status: DC
  Administered 2015-07-12: 15:00:00 via INTRAVENOUS
  Filled 2015-07-12 (×4): qty 1000

## 2015-07-12 MED ORDER — HYDROCODONE-ACETAMINOPHEN 5-325 MG PO TABS: 1.0000 | ORAL_TABLET | ORAL | Status: DC | PRN

## 2015-07-12 MED ORDER — OXYCODONE HCL 5 MG PO TABS
ORAL_TABLET | ORAL | Status: AC
  Filled 2015-07-12: qty 1

## 2015-07-12 MED ORDER — SUGAMMADEX SODIUM 200 MG/2ML IV SOLN
INTRAVENOUS | Status: AC
  Filled 2015-07-12: qty 2

## 2015-07-12 MED ORDER — 0.9 % SODIUM CHLORIDE (POUR BTL) OPTIME
TOPICAL | Status: DC | PRN
  Administered 2015-07-12: 1000 mL

## 2015-07-12 MED ORDER — MIDAZOLAM HCL 2 MG/2ML IJ SOLN
INTRAMUSCULAR | Status: AC
  Filled 2015-07-12: qty 2

## 2015-07-12 MED ORDER — PROPOFOL 10 MG/ML IV BOLUS
INTRAVENOUS | Status: DC | PRN
  Administered 2015-07-12: 50 mg via INTRAVENOUS
  Administered 2015-07-12: 100 mg via INTRAVENOUS

## 2015-07-12 MED ORDER — PROPOFOL 10 MG/ML IV BOLUS
INTRAVENOUS | Status: AC
  Filled 2015-07-12: qty 40

## 2015-07-12 MED ORDER — ACETAMINOPHEN 650 MG RE SUPP: 650.0000 mg | RECTAL | Status: DC | PRN

## 2015-07-12 MED ORDER — OXYCODONE HCL 5 MG PO TABS
5.0000 mg | ORAL_TABLET | Freq: Once | ORAL | Status: AC | PRN
  Administered 2015-07-12: 5 mg via ORAL

## 2015-07-12 MED ORDER — CEFAZOLIN SODIUM-DEXTROSE 2-4 GM/100ML-% IV SOLN
2.0000 g | INTRAVENOUS | Status: AC
  Administered 2015-07-12: 2 g via INTRAVENOUS

## 2015-07-12 MED ORDER — MENTHOL 3 MG MT LOZG: 1.0000 | LOZENGE | OROMUCOSAL | Status: DC | PRN

## 2015-07-12 MED ORDER — THROMBIN 5000 UNITS EX SOLR
OROMUCOSAL | Status: DC | PRN
  Administered 2015-07-12: 5 mL via TOPICAL

## 2015-07-12 MED ORDER — BISACODYL 10 MG RE SUPP: 10.0000 mg | Freq: Every day | RECTAL | Status: DC | PRN

## 2015-07-12 MED ORDER — ONDANSETRON HCL 4 MG/2ML IJ SOLN: 4.0000 mg | Freq: Once | INTRAMUSCULAR | Status: DC | PRN

## 2015-07-12 MED ORDER — ONDANSETRON HCL 4 MG/2ML IJ SOLN
INTRAMUSCULAR | Status: DC | PRN
  Administered 2015-07-12: 4 mg via INTRAVENOUS

## 2015-07-12 MED ORDER — HYDROXYZINE HCL 50 MG/ML IM SOLN: 50.0000 mg | INTRAMUSCULAR | Status: DC | PRN

## 2015-07-12 MED ORDER — DEXAMETHASONE SODIUM PHOSPHATE 10 MG/ML IJ SOLN
INTRAMUSCULAR | Status: AC
  Administered 2015-07-12: 10 mg via INTRAVENOUS
  Filled 2015-07-12: qty 1

## 2015-07-12 MED ORDER — HYDROCORTISONE NA SUCCINATE PF 100 MG IJ SOLR
100.0000 mg | INTRAMUSCULAR | Status: AC
  Administered 2015-07-12: .1 g via INTRAVENOUS
  Filled 2015-07-12: qty 2

## 2015-07-12 MED ORDER — MORPHINE SULFATE (PF) 4 MG/ML IV SOLN
4.0000 mg | INTRAVENOUS | Status: DC | PRN
  Administered 2015-07-12: 4 mg via INTRAMUSCULAR
  Filled 2015-07-12: qty 1

## 2015-07-12 MED ORDER — CHLORHEXIDINE GLUCONATE CLOTH 2 % EX PADS: 6.0000 | MEDICATED_PAD | Freq: Once | CUTANEOUS | Status: DC

## 2015-07-12 MED ORDER — ALUM & MAG HYDROXIDE-SIMETH 200-200-20 MG/5ML PO SUSP: 30.0000 mL | Freq: Four times a day (QID) | ORAL | Status: DC | PRN

## 2015-07-12 MED ORDER — SUGAMMADEX SODIUM 200 MG/2ML IV SOLN
INTRAVENOUS | Status: DC | PRN
  Administered 2015-07-12: 100 mg via INTRAVENOUS

## 2015-07-12 MED ORDER — FENTANYL CITRATE (PF) 100 MCG/2ML IJ SOLN
INTRAMUSCULAR | Status: AC
  Filled 2015-07-12: qty 2

## 2015-07-12 MED ORDER — LIDOCAINE-EPINEPHRINE 1 %-1:100000 IJ SOLN
INTRAMUSCULAR | Status: DC | PRN
  Administered 2015-07-12: 11 mL via INTRADERMAL

## 2015-07-12 MED ORDER — KETOROLAC TROMETHAMINE 30 MG/ML IJ SOLN
INTRAMUSCULAR | Status: AC
  Administered 2015-07-12: 30 mg
  Filled 2015-07-12: qty 1

## 2015-07-12 MED ORDER — SODIUM CHLORIDE 0.9% FLUSH: 3.0000 mL | INTRAVENOUS | Status: DC | PRN

## 2015-07-12 MED ORDER — CYCLOBENZAPRINE HCL 10 MG PO TABS
ORAL_TABLET | ORAL | Status: AC
  Filled 2015-07-12: qty 1

## 2015-07-12 MED ORDER — LIDOCAINE 2% (20 MG/ML) 5 ML SYRINGE
INTRAMUSCULAR | Status: AC
  Filled 2015-07-12: qty 10

## 2015-07-12 MED ORDER — SUCCINYLCHOLINE CHLORIDE 200 MG/10ML IV SOSY
PREFILLED_SYRINGE | INTRAVENOUS | Status: AC
  Filled 2015-07-12: qty 10

## 2015-07-12 MED ORDER — ROCURONIUM BROMIDE 50 MG/5ML IV SOLN
INTRAVENOUS | Status: AC
  Filled 2015-07-12: qty 2

## 2015-07-12 MED ORDER — ARTIFICIAL TEARS OP OINT
TOPICAL_OINTMENT | OPHTHALMIC | Status: AC
  Filled 2015-07-12: qty 3.5

## 2015-07-12 MED ORDER — ACETAMINOPHEN 325 MG PO TABS: 650.0000 mg | ORAL_TABLET | ORAL | Status: DC | PRN

## 2015-07-12 MED ORDER — ONDANSETRON HCL 4 MG PO TABS: 4.0000 mg | ORAL_TABLET | Freq: Four times a day (QID) | ORAL | Status: DC | PRN

## 2015-07-12 MED ORDER — ONDANSETRON HCL 4 MG/2ML IJ SOLN
INTRAMUSCULAR | Status: AC
  Filled 2015-07-12: qty 2

## 2015-07-12 MED ORDER — LIDOCAINE HCL (CARDIAC) 20 MG/ML IV SOLN
INTRAVENOUS | Status: DC | PRN
  Administered 2015-07-12: 50 mg via INTRAVENOUS

## 2015-07-12 MED ORDER — KETOROLAC TROMETHAMINE 30 MG/ML IJ SOLN
30.0000 mg | Freq: Four times a day (QID) | INTRAMUSCULAR | Status: DC
  Filled 2015-07-12: qty 1

## 2015-07-12 MED ORDER — ONDANSETRON HCL 4 MG/2ML IJ SOLN
4.0000 mg | Freq: Four times a day (QID) | INTRAMUSCULAR | Status: DC | PRN
Start: 2015-07-12 — End: 2015-07-13

## 2015-07-12 MED ORDER — KETOROLAC TROMETHAMINE 15 MG/ML IJ SOLN
15.0000 mg | Freq: Four times a day (QID) | INTRAMUSCULAR | Status: DC
  Administered 2015-07-12 – 2015-07-13 (×3): 15 mg via INTRAVENOUS
  Filled 2015-07-12 (×3): qty 1

## 2015-07-12 MED ORDER — MAGNESIUM HYDROXIDE 400 MG/5ML PO SUSP: 30.0000 mL | Freq: Every day | ORAL | Status: DC | PRN

## 2015-07-12 MED ORDER — KETOROLAC TROMETHAMINE 30 MG/ML IJ SOLN
30.0000 mg | Freq: Once | INTRAMUSCULAR | Status: AC
  Administered 2015-07-12: 30 mg via INTRAVENOUS

## 2015-07-12 MED ORDER — PHENYLEPHRINE 40 MCG/ML (10ML) SYRINGE FOR IV PUSH (FOR BLOOD PRESSURE SUPPORT)
PREFILLED_SYRINGE | INTRAVENOUS | Status: AC
  Filled 2015-07-12: qty 10

## 2015-07-12 MED ORDER — MIDAZOLAM HCL 5 MG/5ML IJ SOLN
INTRAMUSCULAR | Status: DC | PRN
  Administered 2015-07-12 (×2): 1 mg via INTRAVENOUS

## 2015-07-12 MED ORDER — ARTIFICIAL TEARS OP OINT
TOPICAL_OINTMENT | OPHTHALMIC | Status: DC | PRN
  Administered 2015-07-12: 1 via OPHTHALMIC

## 2015-07-12 MED ORDER — PHENYLEPHRINE HCL 10 MG/ML IJ SOLN
10.0000 mg | INTRAVENOUS | Status: DC | PRN
  Administered 2015-07-12: 25 ug/min via INTRAVENOUS

## 2015-07-12 MED ORDER — OXYCODONE HCL 5 MG/5ML PO SOLN: 5.0000 mg | Freq: Once | ORAL | Status: AC | PRN

## 2015-07-12 MED ORDER — ROCURONIUM BROMIDE 100 MG/10ML IV SOLN
INTRAVENOUS | Status: DC | PRN
  Administered 2015-07-12 (×2): 10 mg via INTRAVENOUS
  Administered 2015-07-12: 40 mg via INTRAVENOUS

## 2015-07-12 MED ORDER — FENTANYL CITRATE (PF) 250 MCG/5ML IJ SOLN
INTRAMUSCULAR | Status: AC
  Filled 2015-07-12: qty 5

## 2015-07-12 MED ORDER — VITAMIN C 500 MG PO TABS: 500.0000 mg | ORAL_TABLET | Freq: Every day | ORAL | Status: DC

## 2015-07-12 MED ORDER — ACETAMINOPHEN 10 MG/ML IV SOLN
INTRAVENOUS | Status: AC
  Administered 2015-07-12: 1000 mg via INTRAVENOUS
  Filled 2015-07-12: qty 100

## 2015-07-12 MED ORDER — PHENOL 1.4 % MT LIQD: 1.0000 | OROMUCOSAL | Status: DC | PRN

## 2015-07-12 MED ORDER — OXYCODONE-ACETAMINOPHEN 5-325 MG PO TABS
1.0000 | ORAL_TABLET | ORAL | Status: DC | PRN
  Administered 2015-07-12 – 2015-07-13 (×3): 1 via ORAL
  Filled 2015-07-12 (×3): qty 1

## 2015-07-12 MED ORDER — FENTANYL CITRATE (PF) 100 MCG/2ML IJ SOLN
INTRAMUSCULAR | Status: DC | PRN
  Administered 2015-07-12 (×2): 50 ug via INTRAVENOUS
  Administered 2015-07-12: 25 ug via INTRAVENOUS
  Administered 2015-07-12 (×3): 50 ug via INTRAVENOUS
  Administered 2015-07-12: 25 ug via INTRAVENOUS

## 2015-07-12 MED ORDER — FENTANYL CITRATE (PF) 100 MCG/2ML IJ SOLN
25.0000 ug | INTRAMUSCULAR | Status: DC | PRN
Start: 2015-07-12 — End: 2015-07-12
  Administered 2015-07-12 (×2): 50 ug via INTRAVENOUS

## 2015-07-12 MED ORDER — SODIUM CHLORIDE 0.9 % IR SOLN
Status: DC | PRN
  Administered 2015-07-12: 500 mL

## 2015-07-12 MED ORDER — CLONAZEPAM 0.5 MG PO TABS: 0.5000 mg | ORAL_TABLET | Freq: Every day | ORAL | Status: DC | PRN

## 2015-07-12 SURGICAL SUPPLY — 79 items
BAG DECANTER FOR FLEXI CONT (MISCELLANEOUS) ×2 IMPLANT
BIT DRILL NEURO 2X3.1 SFT TUCH (MISCELLANEOUS) ×1 IMPLANT
BIT DRILL WIRE PASS 1.3MM (BIT) ×1 IMPLANT
BLADE CLIPPER SURG (BLADE) ×2 IMPLANT
BLOCKER OASYS (Neuro Prosthesis/Implant) ×16 IMPLANT
BRUSH SCRUB EZ PLAIN DRY (MISCELLANEOUS) ×2 IMPLANT
CANISTER SUCT 3000ML PPV (MISCELLANEOUS) ×2 IMPLANT
CONNECTOR OFFSET OASYS (Orthopedic Implant) ×2 IMPLANT
CONNECTOR SPINAL OASYS 12MM (Connector) ×2 IMPLANT
CONT SPEC 4OZ CLIKSEAL STRL BL (MISCELLANEOUS) ×2 IMPLANT
COVER BACK TABLE 60X90IN (DRAPES) ×2 IMPLANT
DECANTER SPIKE VIAL GLASS SM (MISCELLANEOUS) ×2 IMPLANT
DERMABOND ADVANCED (GAUZE/BANDAGES/DRESSINGS) ×1
DERMABOND ADVANCED .7 DNX12 (GAUZE/BANDAGES/DRESSINGS) ×1 IMPLANT
DRAPE C-ARM 42X72 X-RAY (DRAPES) ×4 IMPLANT
DRAPE LAPAROTOMY 100X72 PEDS (DRAPES) ×2 IMPLANT
DRAPE POUCH INSTRU U-SHP 10X18 (DRAPES) ×2 IMPLANT
DRAPE PROXIMA HALF (DRAPES) IMPLANT
DRILL NEURO 2X3.1 SOFT TOUCH (MISCELLANEOUS) ×2
DRILL OASYS 2.5MM (BIT) ×1 IMPLANT
DRILL WIRE PASS 1.3MM (BIT) ×2
DRIUS OASYS 2.5MM (BIT) ×2
DRSG EMULSION OIL 3X3 NADH (GAUZE/BANDAGES/DRESSINGS) ×2 IMPLANT
ELECT REM PT RETURN 9FT ADLT (ELECTROSURGICAL) ×2
ELECTRODE REM PT RTRN 9FT ADLT (ELECTROSURGICAL) ×1 IMPLANT
EVACUATOR 1/8 PVC DRAIN (DRAIN) IMPLANT
GAUZE SPONGE 4X4 12PLY STRL (GAUZE/BANDAGES/DRESSINGS) ×2 IMPLANT
GAUZE SPONGE 4X4 16PLY XRAY LF (GAUZE/BANDAGES/DRESSINGS) IMPLANT
GLOVE BIO SURGEON STRL SZ 6.5 (GLOVE) ×2 IMPLANT
GLOVE BIO SURGEON STRL SZ7 (GLOVE) ×2 IMPLANT
GLOVE BIOGEL PI IND STRL 8 (GLOVE) ×1 IMPLANT
GLOVE BIOGEL PI INDICATOR 8 (GLOVE) ×1
GLOVE ECLIPSE 7.5 STRL STRAW (GLOVE) ×2 IMPLANT
GLOVE EXAM NITRILE LRG STRL (GLOVE) IMPLANT
GLOVE EXAM NITRILE MD LF STRL (GLOVE) IMPLANT
GLOVE EXAM NITRILE XL STR (GLOVE) IMPLANT
GLOVE EXAM NITRILE XS STR PU (GLOVE) IMPLANT
GLOVE SS BIOGEL STRL SZ 6.5 (GLOVE) ×1 IMPLANT
GLOVE SS BIOGEL STRL SZ 7 (GLOVE) ×1 IMPLANT
GLOVE SS BIOGEL STRL SZ 7.5 (GLOVE) ×1 IMPLANT
GLOVE SUPERSENSE BIOGEL SZ 6.5 (GLOVE) ×1
GLOVE SUPERSENSE BIOGEL SZ 7 (GLOVE) ×1
GLOVE SUPERSENSE BIOGEL SZ 7.5 (GLOVE) ×1
GOWN STRL REUS W/ TWL LRG LVL3 (GOWN DISPOSABLE) IMPLANT
GOWN STRL REUS W/ TWL XL LVL3 (GOWN DISPOSABLE) ×1 IMPLANT
GOWN STRL REUS W/TWL 2XL LVL3 (GOWN DISPOSABLE) IMPLANT
GOWN STRL REUS W/TWL LRG LVL3 (GOWN DISPOSABLE)
GOWN STRL REUS W/TWL XL LVL3 (GOWN DISPOSABLE) ×1
HEMOSTAT SURGICEL 2X14 (HEMOSTASIS) IMPLANT
KIT BASIN OR (CUSTOM PROCEDURE TRAY) ×2 IMPLANT
KIT INFUSE XX SMALL 0.7CC (Orthopedic Implant) ×2 IMPLANT
KIT ROOM TURNOVER OR (KITS) ×2 IMPLANT
NEEDLE SPNL 18GX3.5 QUINCKE PK (NEEDLE) ×2 IMPLANT
NEEDLE SPNL 22GX3.5 QUINCKE BK (NEEDLE) ×4 IMPLANT
NS IRRIG 1000ML POUR BTL (IV SOLUTION) ×2 IMPLANT
PACK LAMINECTOMY NEURO (CUSTOM PROCEDURE TRAY) ×2 IMPLANT
PAD ARMBOARD 7.5X6 YLW CONV (MISCELLANEOUS) ×6 IMPLANT
PIN MAYFIELD SKULL DISP (PIN) ×2 IMPLANT
ROD OASYS 3.5X40MM (Rod) ×2 IMPLANT
ROD OASYS 3.5X60MM (Rod) ×2 IMPLANT
SCREW BIASED ANGLE 3.5X12 (Screw) ×10 IMPLANT
SCREW BIASED ANGLE 3.5X16 (Screw) ×2 IMPLANT
SCREW CANC OASYS 3.5X18 (Screw) ×4 IMPLANT
SPONGE LAP 4X18 X RAY DECT (DISPOSABLE) IMPLANT
SPONGE SURGIFOAM ABS GEL 100 (HEMOSTASIS) ×2 IMPLANT
STAPLER SKIN PROX WIDE 3.9 (STAPLE) ×2 IMPLANT
STRIP BIOACTIVE VITOSS 25X52X4 (Orthopedic Implant) ×2 IMPLANT
SUT ETHILON 3 0 FSL (SUTURE) IMPLANT
SUT VIC AB 0 CT1 18XCR BRD8 (SUTURE) ×2 IMPLANT
SUT VIC AB 0 CT1 8-18 (SUTURE) ×2
SUT VIC AB 2-0 CP2 18 (SUTURE) ×4 IMPLANT
SUT VIC AB 3-0 SH 8-18 (SUTURE) ×2 IMPLANT
TAP 3.5MM (TAP) ×2 IMPLANT
TAPE CLOTH SURG 4X10 WHT LF (GAUZE/BANDAGES/DRESSINGS) ×2 IMPLANT
TOWEL OR 17X24 6PK STRL BLUE (TOWEL DISPOSABLE) ×2 IMPLANT
TOWEL OR 17X26 10 PK STRL BLUE (TOWEL DISPOSABLE) ×2 IMPLANT
TRAY FOLEY W/METER SILVER 16FR (SET/KITS/TRAYS/PACK) IMPLANT
UNDERPAD 30X30 INCONTINENT (UNDERPADS AND DIAPERS) ×2 IMPLANT
WATER STERILE IRR 1000ML POUR (IV SOLUTION) ×2 IMPLANT

## 2015-07-12 NOTE — Op Note (Signed)
07/12/2015  11:47 AM  PATIENT:  Anne Garrett  40 y.o. female  PRE-OPERATIVE DIAGNOSIS:  Cervical stenosis, cervical spondylosis with myelopathy, cervical degenerative disc disease  POST-OPERATIVE DIAGNOSIS:   Cervical stenosis, cervical spondylosis with myelopathy, cervical degenerative disc disease  PROCEDURE:  Procedure(s):  C2 to C5 Cervical laminectomy, C2 to C5 posterior cervical arthrodesis with segmental Oasys posterior instrumentation, locally harvested morcellized autograft,  Vitoss BA, and infuse  SURGEON:  Surgeon(s): Jovita Gamma, MD  ASSISTANTS: Leeroy Cha, M.D.  ANESTHESIA:   general  EBL:  Total I/O In: 1000 [I.V.:1000] Out: 50 [Blood:50]  BLOOD ADMINISTERED:none  COUNT:  Correct sponge and needle count  DICTATION: Patient was brought to the operating room, placed under general endotracheal anesthesia by the anesthesia service. 3. Radiolucent Mayfield head holder was applied, the patient was turned to a prone position. Hair was clipped from the inferior occipital scalp. The base of the occipital region, the posterior neck, and upper back were prepped with Betadine soap and solution draped in a sterile fashion. The C-arm fluoroscope was brought into the field, and the C2-C5 level identified. The skin and subcutaneous tissue of the posterior cervical midline was infiltrated with local anesthetic with epinephrine. Posterior midline incision was made, and carried down through the subcutaneous tissue bipolar cautery and electrocautery used to maintain hemostasis. Dissection was carried down to the posterior cervical fascia which was incised bilaterally, and the cervical muscle trauma was dissected from the spinous process and lamina in a subperiosteal fashion. Subcutaneous retractor was placed, and using C-arm fluoroscopy, we identified the C2-C5 levels. Dissection was carried out laterally exposing the facet joints at C2-3, C3-4, and C4-5. There was significant mobility  of the C3-4 and C4-5 segments despite the anterior cervical plating.   We then proceeded with placing the lateral mass screws with C-arm fluoroscopic guidance. Entry points for lateral mass screws at C3, C4, C5 bilaterally were identified, and pilot holes were made with the high-speed drill. Screw holes were then made with the hand drill. Each screw hole was examined with the ball probe, good bony surfaces were found, the posterior cortex was tapped at each level, and 3.5 x 14 mm cortical screws were placed bilaterally. We later had to change out the left C3 screw to a 3.5 x 16 mm cortical screw, leaving the screw head proud, to facilitate placement of the rod.  Once all 6 lateral mass screws were in place, we proceeded with the decompression. This inferior C2, complete C3, complete C4, and superior C5 laminectomy was performed using double-action rongeurs, the high-speed drill, and Kerrison punches with thin footplates. Care was taken to leave the underlying dura undisturbed. The thickened ligamentum flavum at C2-3, C3-4, and C4-5 levels was carefully removed. Hemostasis was established with the use of Gelfoam with thrombin, and once good hemostasis was achieved, the Gelfoam was removed, and a thin layer of Surgifoam applied.  It was decided to place C2 laminar screws to facilitate arthrodesis across the C2-3 level. C-arm fluoroscopic guidance was again used. Pilot holes were made with the high-speed drill, and the screw holes were hand drilled. We then placed 3.5 x 18 mm cancellus screws bilaterally.  The posterior instrumentation construct on the left was then assembled using a short (12 mm) offset connector at C2 and a 60 mm rod, that was bent. Locking caps were placed to secure the construct. The posterior screw irritation construct on the right was then assembled using a long (20 mm) offset connector at C2 and  a 40 mm rod, that was bent. Again locking caps were placed to secure the construct. Once both  constructs were assembled, final tightening was performed against a counter torque.  The facet joints had been decorticated bilaterally as was the lateral posterior bony surfaces of the articular processes. Locally harvested morcellized autograft, Vitoss BA, and infuse were packed in each of the facet joints, as well as over the lateral posterior aspect of the C2-C5 articular processes.  The wound had been irrigated with saline solution and bacitracin solution. Good hemostasis was confirmed. We then proceeded with closure. Paracervical musclature was approximated with interrupted undyed 0 Vicryl sutures, the cervical fascia was closed with interrupted undyed 0 Vicryl sutures, and the subcutaneous and subcuticular closed with interrupted inverted 2-0 and 3-0 undyed Vicryl sutures. The skin is approximate surgical staples. Wound was dressed with Adaptic, sterile gauze, and Hypafix.  Following surgery the patient was turned back to a supine position, the 3 pin Mayfield head holder was removed, and the patient is to be reversed from the anesthetic, extubated, and transferred to the recovery room for further care.  PLAN OF CARE: Admit for overnight observation  PATIENT DISPOSITION:  PACU - hemodynamically stable.   Delay start of Pharmacological VTE agent (>24hrs) due to surgical blood loss or risk of bleeding:  yes

## 2015-07-12 NOTE — H&P (Signed)
Subjective: Patient is a 40 y.o. right-handed white female who is admitted for treatment of cervical stenosis with myelopathy. Patient has a history of juvenile rheumatoid arthritis since age 13 months and presented with increasing numbness and tingling in the occipital scalp, in the fingertips, and in her feet. She had increasing neck pain, and decreasing strength in the right grip. She was found by x-rays and MRI scan to have dynamic anterolisthesis of C3 on 4 and C4 on 5.  MRI was done and compared to her previous study from July 2013. The new study showed increased anterolisthesis at each level, increased cervical stenosis and spinal cord compression, and new increased signal within the spinal cord.  Patient was taken to surgery 3 weeks ago for a 2 level C3-4 and C4-5 ACDF. Initially for the first 4 days after surgery she had excellent relief of her numbness, but then began developing increasing numbness, as well as "zinging" pain into the upper and lower extremities. The numbness extends from the neck through her torso and through her upper extremity, down to the proximal physis bilaterally. Recently she feels that she has some decreased fine motor control with her hands, more due to sensory deficit then increased weakness.  MRI was repeated, and showed good ventral decompression of the spinal disc herniations at both the C3-4 and C4-5 levels, but residual canal stenosis from C3-C5 because of dorsal encroachment and impingement, with some increased signal in the spinal cord. Prior to her surgery last month we discussed the possible need for posterior decompression stabilization as well,. This was discussed again yesterday while we were reviewing the updated MRI, and the patient feels that her symptoms are sufficient, and certainly the MRI. Shows significant residuals canal stenosis and increased signal within the spinal cord, and therefore the patient is admitted today for cervical laminectomy and posterior  cervical arthrodesis.   Patient Active Problem List   Diagnosis Date Noted  . HNP (herniated nucleus pulposus), cervical 06/19/2015  . Numbness 05/20/2015  . Chronic rheumatic arthritis ([place]) 05/20/2015  . Carpal tunnel syndrome 05/20/2015  . Snoring 05/20/2015  . Excessive daytime sleepiness 05/20/2015   Past Medical History  Diagnosis Date  . Intervertebral disk disease   . Osteoporosis   . Adrenal insufficiency ([place])     secondary to Prednisone  . Heart murmur     only hear once slight  . Headache     Migraine  . Rheumatoid arthritis ([place])     Juvenile onset  . Eczema     coner oif eye lid    Past Surgical History  Procedure Laterality Date  . Appendectomy    . Cholecystectomy    . Abdominal surgery    . Essure tubal ligation      Essure Implant-Permanent birth control  . Joint replacement Bilateral     bilat hip replacements  . Carpal tunnel release Right   . Cesarean section    . Ankle arthroplasty Right   . Orif femur fracture Right   . Wrist surgery Bilateral     arthrodesis  . Excision to palm Right     excision of mass to right plam  . Right foot surgery Right     right jfoot arthrodesis and right ankle arthroplasty  . Anterior cervical decomp/discectomy fusion N/A 06/19/2015    Procedure: C3-4 C4-5 Anterior cervical decompression/diskectomy/fusion;  Surgeon: Jovita Gamma, MD;  Location: [facility];  Service: Neurosurgery;  Laterality: N/A;  C3-4 C4-5 Anterior cervical decompression/diskectomy/fusion  . Colonoscopy  Prescriptions prior to admission  Medication Sig Dispense Refill Last Dose  . clonazePAM (KLONOPIN) 1 MG tablet Take 0.5 mg by mouth daily as needed for anxiety.   07/11/2015 at Unknown time  . Multiple Vitamin (MULTIVITAMIN) tablet Take 1 tablet by mouth daily.   07/11/2015 at Unknown time  . predniSONE (DELTASONE) 5 MG tablet Take 5 mg by mouth every evening.    07/11/2015 at Unknown time  . vitamin C (ASCORBIC ACID) 500 MG tablet Take  500 mg by mouth daily.   07/11/2015 at Unknown time  . HYDROcodone-acetaminophen (NORCO/VICODIN) 5-325 MG tablet Take 1-2 tablets by mouth every 4 (four) hours as needed (mild pain). 50 tablet 0   . ibandronate (BONIVA) 150 MG tablet Take 150 mg by mouth every 30 (thirty) days. Take in the morning with a full glass of water, on an empty stomach, and do not take anything else by mouth or lie down for the next 30 min.   07/04/2015  . methocarbamol (ROBAXIN) 500 MG tablet Take 500 mg by mouth 2 (two) times daily as needed for muscle spasms.   Past Week at Unknown time  . Vitamin D, Ergocalciferol, (DRISDOL) 50000 units CAPS capsule Take 50,000 Units by mouth every 30 (thirty) days. Takes with Boniva   07/04/2015   Allergies  Allergen Reactions  . Leflunomide Diarrhea  . Teriparatide Other (See Comments)    Severe Muscle Pain, Forteo  . Methotrexate Diarrhea and Other (See Comments)    Fatigue  . Paroxetine Hcl Diarrhea  . Sulfa Antibiotics Itching and Rash    Head to toe  . Valproic Acid And Related Rash    Head to toe    Social History  Substance Use Topics  . Smoking status: Former Smoker -- 0.25 packs/day for 20 years  . Smokeless tobacco: Not on file  . Alcohol Use: 1.2 oz/week    2 Glasses of wine per week     Comment: social    Family History  Problem Relation Age of Onset  . Breast cancer Mother 42  . Breast cancer Maternal Aunt     2 mat aunts   . Nephritis Father   . Epilepsy Father   . Multiple sclerosis Maternal Uncle   . Psoriasis Paternal Aunt      Review of Systems A comprehensive review of systems was negative.  Objective: Vital signs in last 24 hours: Temp:  [98.8 F (37.1 C)] 98.8 F (37.1 C) (07/08 0636) Pulse Rate:  [77] 77 (07/08 0636) Resp:  [18] 18 (07/08 0636) BP: (99)/(81) 99/81 mmHg (07/08 0636) SpO2:  [99 %] 99 % (07/08 0636) Weight:  [40.37 kg (89 lb)] 40.37 kg (89 lb) (07/08 0636)  EXAM: Patient is a slight white female in no acute distress.  Anterior cervical incision is well-healed. Lungs are clear to auscultation , the patient has symmetrical respiratory excursion. Heart has a regular rate and rhythm normal S1 and S2 no murmur.   Abdomen is soft nontender nondistended bowel sounds are present. Extremity examination shows no clubbing cyanosis or edema. She does have significant rheumatoid arthritic deformities of her hands bilaterally, as well as postsurgical changes related to orthopedic procedures done for her rheumatoid arthritis. Motor exam shows 5/5 strength in the deltoid, biceps, and triceps. Strength in the hand is somewhat limited due to her rheumatoid arthritic deformities of her hands, with some weakness in the intrinsics, but fairly good grips bilaterally.  Data Review:CBC    Component Value Date/Time   WBC  5.3 07/12/2015 0702   RBC 3.72* 07/12/2015 0702   HGB 12.1 07/12/2015 0702   HCT 37.6 07/12/2015 0702   PLT 346 07/12/2015 0702   MCV 101.1* 07/12/2015 0702   MCH 32.5 07/12/2015 0702   MCHC 32.2 07/12/2015 0702   RDW 12.9 07/12/2015 0702   LYMPHSABS 2.9 05/15/2015 1013   MONOABS 0.7 05/15/2015 1013   EOSABS 0.1 05/15/2015 1013   BASOSABS 0.0 05/15/2015 1013                          BMET    Component Value Date/Time   NA 138 06/12/2015 1558   K 3.8 06/12/2015 1558   CL 104 06/12/2015 1558   CO2 26 06/12/2015 1558   GLUCOSE 89 06/12/2015 1558   BUN 5* 06/12/2015 1558   CREATININE 0.68 06/12/2015 1558   CALCIUM 9.1 06/12/2015 1558   GFRNONAA >60 06/12/2015 1558   GFRAA >60 06/12/2015 1558     Assessment/Plan: Patient with cervical myelopathy, 3 weeks status post 2 level ACDF, who is admitted now for posterior cervical decompression and stabilization.  I've discussed with the patient the nature of his condition, the nature the surgical procedure, the typical length of surgery, hospital stay, and overall recuperation. We discussed limitations postoperatively. I discussed risks of surgery including risks  of infection, bleeding, possibly need for transfusion, the risk of nerve root dysfunction with pain, weakness, numbness, or paresthesias, the risk of spinal cord dysfunction with paralysis of all 4 limbs and quadriplegia, and the risk of dural tear and CSF leakage and possible need for further surgery, the risk of failure of the arthrodesis and the possible need for further surgery, and the risk of anesthetic complications including myocardial infarction, stroke, pneumonia, and death. We also discussed the need for postoperative immobilization in a cervical collar. Understanding all this the patient does wish to proceed with surgery and is admitted for such.    Hosie Spangle, MD 07/12/2015 7:21 AM

## 2015-07-12 NOTE — OR Nursing (Signed)
Pt w/ no outward s/s of great discomfort, HR 60s, BP normal range for baseline, relaxed breathing while discribing pain as a 6 on scale of 1 to 10. Dr. Linna Caprice updated and patient is ok for tx to floor.

## 2015-07-12 NOTE — Anesthesia Preprocedure Evaluation (Addendum)
Anesthesia Evaluation  Patient identified by MRN, date of birth, ID band Patient awake    Reviewed: Allergy & Precautions, NPO status , Patient's Chart, lab work & pertinent test results  Airway Mallampati: II  TM Distance: >3 FB Neck ROM: Full    Dental  (+) Teeth Intact, Dental Advisory Given   Pulmonary former smoker,    breath sounds clear to auscultation       Cardiovascular  Rhythm:Regular Rate:Normal     Neuro/Psych    GI/Hepatic   Endo/Other    Renal/GU      Musculoskeletal   Abdominal   Peds  Hematology   Anesthesia Other Findings Patient is not on hormonal birth control- discussed use of Sugammadex  Reproductive/Obstetrics                            Anesthesia Physical Anesthesia Plan  ASA: III  Anesthesia Plan: General   Post-op Pain Management:    Induction: Intravenous  Airway Management Planned: Oral ETT  Additional Equipment:   Intra-op Plan:   Post-operative Plan: Extubation in OR  Informed Consent: I have reviewed the patients History and Physical, chart, labs and discussed the procedure including the risks, benefits and alternatives for the proposed anesthesia with the patient or authorized representative who has indicated his/her understanding and acceptance.   Dental advisory given  Plan Discussed with: CRNA and Anesthesiologist  Anesthesia Plan Comments: (Cervical Spondylosis with myelopathy RA with longstanding steroid use Reduced neck mobility  Plan GA with glide scope intubation )        Anesthesia Quick Evaluation

## 2015-07-12 NOTE — Progress Notes (Signed)
Filed Vitals:   07/12/15 0636 07/12/15 1205  BP: 99/81 127/94  Pulse: 77 91  Temp: 98.8 F (37.1 C) 98.3 F (36.8 C)  TempSrc: Oral   Resp: 18 19  Height: 5' (1.524 m)   Weight: 40.37 kg (89 lb)   SpO2: 99% 99%    CBC  Recent Labs  07/12/15 0702  WBC 5.3  HGB 12.1  HCT 37.6  PLT 346   BMET  Recent Labs  07/12/15 0702  NA 139  K 4.1  CL 109  CO2 23  GLUCOSE 116*  BUN 6  CREATININE 0.43*  CALCIUM 8.2*    Patient seen and examined in recovery room. Awake and alert, following commands. Moving all 4 extremities well, right hand mildly weak as compared to left hand. Dressing clean and dry. Patient has voided.  Plan: To transfer up to neurosurgical unit after recovery room. To begin ambulation this afternoon once anesthesia has further cleared. Spoke with patient's boyfriend, Gerald Stabs [phone]), by phone and reported about how surgery went and the patient's condition in the recovery room. His questions were answered.  Hosie Spangle, MD 07/12/2015, 12:24 PM

## 2015-07-12 NOTE — Progress Notes (Signed)
Filed Vitals:   07/12/15 1236 07/12/15 1250 07/12/15 1305 07/12/15 1320  BP: 114/57 119/84 126/75 119/84  Pulse: 85 82 80 58  Temp:    98.4 F (36.9 C)  TempSrc:      Resp: 17 13 19 11   Height:      Weight:      SpO2: 100% 100% 100% 100%    CBC  Recent Labs  07/12/15 0702  WBC 5.3  HGB 12.1  HCT 37.6  PLT 346   BMET  Recent Labs  07/12/15 0702  NA 139  K 4.1  CL 109  CO2 23  GLUCOSE 116*  BUN 6  CREATININE 0.43*  CALCIUM 8.2*    Patient resting in bed, describes significant posterior neck pain, consistent with incisional pain and the surgery she underwent. Dressing clean and dry. Has been up to the bathroom, and has voided several times. Motor function stable.  Plan: Spoke with patient, her boyfriend, and her nurse about having the patient ambulate in the halls at least 2-3 times before bedtime, and again a couple of times in the morning. We'll transition from parenteral analgesics to enteral analgesics.  Hosie Spangle, MD 07/12/2015, 4:47 PM

## 2015-07-12 NOTE — Anesthesia Procedure Notes (Signed)
Procedure Name: Intubation Date/Time: 07/12/2015 8:04 AM Performed by: Suzy Bouchard Pre-anesthesia Checklist: Patient identified, Emergency Drugs available, Suction available, Patient being monitored and Timeout performed Patient Re-evaluated:Patient Re-evaluated prior to inductionOxygen Delivery Method: Circle system utilized Preoxygenation: Pre-oxygenation with 100% oxygen Intubation Type: IV induction Ventilation: Mask ventilation without difficulty and Oral airway inserted - appropriate to patient size Laryngoscope Size: Glidescope and 3 Grade View: Grade II Tube type: Oral Tube size: 7.0 mm Number of attempts: 1 Airway Equipment and Method: Stylet and Video-laryngoscopy Placement Confirmation: ETT inserted through vocal cords under direct vision,  positive ETCO2 and breath sounds checked- equal and bilateral Secured at: 22 cm Tube secured with: Tape Dental Injury: Teeth and Oropharynx as per pre-operative assessment  Comments: Neck neutral for glidescope intubation.  Small adult glidescope used.

## 2015-07-12 NOTE — Transfer of Care (Signed)
Immediate Anesthesia Transfer of Care Note  Patient: Anne Garrett  Procedure(s) Performed: Procedure(s) with comments: C2 to C5 Cervical laminectomy, C2 to C5 posterior cervical arthrodesis with instrumentation and bone graft (N/A) - C2 to C5 Cervical laminectomy, C2  to C5 posterior cervical arthrodesis with instrumentation and bone graft  Patient Location: PACU  Anesthesia Type:General  Level of Consciousness: awake, alert  and oriented  Airway & Oxygen Therapy: Patient Spontanous Breathing and Patient connected to nasal cannula oxygen  Post-op Assessment: Report given to RN, Post -op Vital signs reviewed and stable and Patient moving all extremities  Post vital signs: Reviewed and stable  Last Vitals:  Filed Vitals:   07/12/15 0636 07/12/15 1205  BP: 99/81 127/94  Pulse: 77 91  Temp: 37.1 C 36.8 C  Resp: 18 19    Last Pain: There were no vitals filed for this visit.    Patients Stated Pain Goal: 5 ([id number])  Complications: No apparent anesthesia complications

## 2015-07-12 NOTE — Anesthesia Postprocedure Evaluation (Signed)
Anesthesia Post Note  Patient: Anne Garrett  Procedure(s) Performed: Procedure(s) (LRB): C2 to C5 Cervical laminectomy, C2 to C5 posterior cervical arthrodesis with instrumentation and bone graft (N/A)  Patient location during evaluation: PACU Anesthesia Type: General Level of consciousness: awake, awake and alert and oriented Pain management: pain level controlled Vital Signs Assessment: post-procedure vital signs reviewed and stable Respiratory status: spontaneous breathing, nonlabored ventilation and respiratory function stable Cardiovascular status: blood pressure returned to baseline Anesthetic complications: no    Last Vitals:  Filed Vitals:   07/12/15 1712 07/12/15 2121  BP: 102/57 94/73  Pulse: 54 81  Temp: 36.7 C 37.1 C  Resp: 16 18    Last Pain:  Filed Vitals:   07/12/15 2207  PainSc: 4                  Arlin Savona COKER

## 2015-07-13 DIAGNOSIS — Z981 Arthrodesis status: Secondary | ICD-10-CM

## 2015-07-13 MED ORDER — HYDROCODONE-ACETAMINOPHEN 5-325 MG PO TABS: 1.0000 | ORAL_TABLET | ORAL | Status: DC | PRN

## 2015-07-13 NOTE — Progress Notes (Signed)
Discharge Note:  Pt alert and oriented and in no distress.  Patient given discharge instructions regarding signs and symptoms to report, medications, diet, activity, and upcoming appointments. She verbalized understanding of all instructions.  Peripheral IV discontinued. Patient confirmed that she had all of her personal belongings.  She insisted on walking out.

## 2015-07-13 NOTE — Discharge Summary (Signed)
Physician Discharge Summary  Patient ID: Anne Garrett MRN: [id number] DOB/AGE: [birth year] 40 y.o.  Admit date: 07/12/2015 Discharge date: 07/13/2015  Admission Diagnoses: Cervical spinal stenosis    Discharge Diagnoses: Same   Discharged Condition: good  Hospital Course: The patient was admitted on 07/12/2015 and taken to the operating room where the patient underwent posterior cervical decompression and fusion. The patient tolerated the procedure well and was taken to the recovery room and then to the floor in stable condition. The hospital course was routine. There were no complications. The wound remained clean dry and intact. Pt had appropriate neck soreness. No complaints of arm pain or new N/T/W. The patient remained afebrile with stable vital signs, and tolerated a regular diet. The patient continued to increase activities, and pain was well controlled with oral pain medications.   Consults: None  Significant Diagnostic Studies:  Results for orders placed or performed during the hospital encounter of 07/12/15  CBC  Result Value Ref Range   WBC 5.3 4.0 - 10.5 K/uL   RBC 3.72 (L) 3.87 - 5.11 MIL/uL   Hemoglobin 12.1 12.0 - 15.0 g/dL   HCT 37.6 36.0 - 46.0 %   MCV 101.1 (H) 78.0 - 100.0 fL   MCH 32.5 26.0 - 34.0 pg   MCHC 32.2 30.0 - 36.0 g/dL   RDW 12.9 11.5 - 15.5 %   Platelets 346 150 - 400 K/uL  Basic metabolic panel  Result Value Ref Range   Sodium 139 135 - 145 mmol/L   Potassium 4.1 3.5 - 5.1 mmol/L   Chloride 109 101 - 111 mmol/L   CO2 23 22 - 32 mmol/L   Glucose, Bld 116 (H) 65 - 99 mg/dL   BUN 6 6 - 20 mg/dL   Creatinine, Ser 0.43 (L) 0.44 - 1.00 mg/dL   Calcium 8.2 (L) 8.9 - 10.3 mg/dL   GFR calc non Af Amer >60 >60 mL/min   GFR calc Af Amer >60 >60 mL/min   Anion gap 7 5 - 15  hCG, serum, qualitative  Result Value Ref Range   Preg, Serum NEGATIVE NEGATIVE    Dg Cervical Spine 1 View  07/12/2015  CLINICAL DATA:  Intraoperative needle count. EXAM:  CERVICAL SPINE 1 VIEW COMPARISON:  None. FINDINGS: Anterior and posterior fusion noted. Intubated. No radiodense surgical density to localize neelde. IMPRESSION: No retained surgical instrument. Electronically Signed   By: Suzy Bouchard M.D.   On: 07/12/2015 12:25   Dg Cervical Spine 2-3 Views  07/12/2015  CLINICAL DATA:  C2 to C5 Cervical laminectomy, C2 to C5 posterior cervical arthrodesis with instrumentation and bone graft done in Neuro OR 4 Fluoro time = 20.2 seconds EXAM: DG C-ARM 61-120 MIN; CERVICAL SPINE - 2-3 VIEW COMPARISON:  MRI 07/09/2015 FINDINGS: Prior anterior cervical fusion from C3-C5. Interval posterior fusion from C2 - C5. Intubated patient. No complication IMPRESSION: Posterior cervical fusion from C2 and C5. Electronically Signed   By: Suzy Bouchard M.D.   On: 07/12/2015 12:23   Dg Cervical Spine 2-3 Views  06/19/2015  CLINICAL DATA:  Spondylosis.  Myelopathy.  Radiculopathy. EXAM: CERVICAL SPINE - 2-3 VIEW COMPARISON:  CT 05/06/2015.  MRI 05/01/2015. FINDINGS: Metallic marker noted anteriorly at C3-C4. C3 through C5 anterior and interbody fusion. No acute abnormality identified. IMPRESSION: C3 through C5 anterior and interbody fusion . No acute bony abnormality. Electronically Signed   By: Marcello Moores  Register   On: 06/19/2015 10:09   Dg C-arm 61-120 Min  07/12/2015  CLINICAL  DATA:  C2 to C5 Cervical laminectomy, C2 to C5 posterior cervical arthrodesis with instrumentation and bone graft done in Neuro OR 4 Fluoro time = 20.2 seconds EXAM: DG C-ARM 61-120 MIN; CERVICAL SPINE - 2-3 VIEW COMPARISON:  MRI 07/09/2015 FINDINGS: Prior anterior cervical fusion from C3-C5. Interval posterior fusion from C2 - C5. Intubated patient. No complication IMPRESSION: Posterior cervical fusion from C2 and C5. Electronically Signed   By: Suzy Bouchard M.D.   On: 07/12/2015 12:23    Antibiotics:  Anti-infectives    Start     Dose/Rate Route Frequency Ordered Stop   07/12/15 0859  bacitracin  50,000 Units in sodium chloride irrigation 0.9 % 500 mL irrigation  Status:  Discontinued       As needed 07/12/15 0901 07/12/15 1155   07/12/15 0630  ceFAZolin (ANCEF) IVPB 2g/100 mL premix     2 g 200 mL/hr over 30 Minutes Intravenous On call to O.R. 07/12/15 0615 07/12/15 0830   07/12/15 0617  ceFAZolin (ANCEF) 2-4 GM/100ML-% IVPB    Comments:  Henrine Screws   : cabinet override      07/12/15 0617 07/12/15 1829      Discharge Exam: Blood pressure 106/65, pulse 79, temperature 98.4 F (36.9 C), temperature source Oral, resp. rate 18, height 5' (1.524 m), weight 40.37 kg (89 lb), last menstrual period 06/28/2015, SpO2 100 %. Neurologic: Grossly normal Dressing dry  Discharge Medications:     Medication List    TAKE these medications        clonazePAM 1 MG tablet  Commonly known as:  KLONOPIN  Take 0.5 mg by mouth daily as needed for anxiety.     HYDROcodone-acetaminophen 5-325 MG tablet  Commonly known as:  NORCO/VICODIN  Take 1-2 tablets by mouth every 4 (four) hours as needed (mild pain).     ibandronate 150 MG tablet  Commonly known as:  BONIVA  Take 150 mg by mouth every 30 (thirty) days. Take in the morning with a full glass of water, on an empty stomach, and do not take anything else by mouth or lie down for the next 30 min.     methocarbamol 500 MG tablet  Commonly known as:  ROBAXIN  Take 500 mg by mouth 2 (two) times daily as needed for muscle spasms.     multivitamin tablet  Take 1 tablet by mouth daily.     predniSONE 5 MG tablet  Commonly known as:  DELTASONE  Take 5 mg by mouth every evening.     vitamin C 500 MG tablet  Commonly known as:  ASCORBIC ACID  Take 500 mg by mouth daily.     Vitamin D (Ergocalciferol) 50000 units Caps capsule  Commonly known as:  DRISDOL  Take 50,000 Units by mouth every 30 (thirty) days. Takes with Boniva        Disposition: Home   Final Dx: Posterior cervical decompression and fusion      Discharge Instructions      Remove dressing in 72 hours    Complete by:  As directed      Call MD for:  difficulty breathing, headache or visual disturbances    Complete by:  As directed      Call MD for:  persistant nausea and vomiting    Complete by:  As directed      Call MD for:  redness, tenderness, or signs of infection (pain, swelling, redness, odor or green/yellow discharge around incision site)    Complete by:  As directed      Call MD for:  severe uncontrolled pain    Complete by:  As directed      Call MD for:  temperature >100.4    Complete by:  As directed      Diet - low sodium heart healthy    Complete by:  As directed      Discharge instructions    Complete by:  As directed   No driving, no heavy lifting, no strenuous activity, may shower     Increase activity slowly    Complete by:  As directed            Follow-up Information    Follow up with Hosie Spangle, MD. Schedule an appointment as soon as possible for a visit in 2 weeks.   Specialty:  Neurosurgery   Contact information:   [address] [place] [address] [phone]        Signed: Eustace Moore 07/13/2015, 8:56 AM

## 2015-07-14 ENCOUNTER — Encounter (HOSPITAL_COMMUNITY): Payer: Self-pay | Admitting: Neurosurgery

## 2015-07-28 DIAGNOSIS — K0820 Unspecified atrophy of edentulous alveolar ridge: Secondary | ICD-10-CM | POA: Diagnosis not present

## 2015-07-28 DIAGNOSIS — S025XXA Fracture of tooth (traumatic), initial encounter for closed fracture: Secondary | ICD-10-CM | POA: Diagnosis not present

## 2015-07-28 DIAGNOSIS — J3489 Other specified disorders of nose and nasal sinuses: Secondary | ICD-10-CM | POA: Diagnosis not present

## 2015-07-28 MED FILL — clonazePAM 1 MG TABS: 1 | 30 days supply | Qty: 30 | Fill #1

## 2015-07-29 ENCOUNTER — Telehealth: Payer: Self-pay | Admitting: Neurology

## 2015-07-29 DIAGNOSIS — R0683 Snoring: Secondary | ICD-10-CM

## 2015-07-29 DIAGNOSIS — G4733 Obstructive sleep apnea (adult) (pediatric): Secondary | ICD-10-CM

## 2015-07-29 NOTE — Telephone Encounter (Signed)
Pt called said she needs a referral for dental appliance sent to Dr Mensah/[facility] (p) [phone]  (f) [phone]. She also needs a cc of sleep study to be sent with it also.

## 2015-07-29 NOTE — Telephone Encounter (Signed)
Order awaiting RAS sig/fim 

## 2015-07-29 NOTE — Telephone Encounter (Signed)
Order for oral appliance, plus sleep study, ov notes faxed to Dr. Roosvelt Maser fax# [phone]

## 2015-07-30 MED FILL — BUTALB-ACETAMIN-CAFF 50-325: 50-325-40 | 5 days supply | Qty: 30 | Fill #0

## 2015-08-01 DIAGNOSIS — M4802 Spinal stenosis, cervical region: Secondary | ICD-10-CM | POA: Diagnosis not present

## 2015-08-12 DIAGNOSIS — Z0000 Encounter for general adult medical examination without abnormal findings: Secondary | ICD-10-CM | POA: Diagnosis not present

## 2015-08-12 DIAGNOSIS — M818 Other osteoporosis without current pathological fracture: Secondary | ICD-10-CM | POA: Diagnosis not present

## 2015-08-14 MED FILL — BUTALB-ACETAMIN-CAFF 50-325: 50-325-40 | 5 days supply | Qty: 30 | Fill #0

## 2015-08-28 DIAGNOSIS — K08409 Partial loss of teeth, unspecified cause, unspecified class: Secondary | ICD-10-CM | POA: Diagnosis not present

## 2015-09-03 DIAGNOSIS — M5030 Other cervical disc degeneration, unspecified cervical region: Secondary | ICD-10-CM | POA: Diagnosis not present

## 2015-09-03 DIAGNOSIS — M4712 Other spondylosis with myelopathy, cervical region: Secondary | ICD-10-CM | POA: Diagnosis not present

## 2015-09-03 DIAGNOSIS — Z981 Arthrodesis status: Secondary | ICD-10-CM | POA: Diagnosis not present

## 2015-09-03 DIAGNOSIS — M542 Cervicalgia: Secondary | ICD-10-CM | POA: Diagnosis not present

## 2015-09-09 MED FILL — clonazePAM 1 MG TABS: 1 | 30 days supply | Qty: 30 | Fill #2

## 2015-09-09 MED FILL — AMOXICILLIN 500 MG CAPSULE: 500 | 3 days supply | Qty: 12 | Fill #0

## 2015-09-09 MED FILL — predniSONE 5 MG TABS: 5 | 90 days supply | Qty: 90 | Fill #0

## 2015-09-10 DIAGNOSIS — K0402 Irreversible pulpitis: Secondary | ICD-10-CM | POA: Diagnosis not present

## 2015-09-11 MED FILL — BUTALB-ACETAMIN-CAFF 50-325: 50-325-40 | 5 days supply | Qty: 30 | Fill #0

## 2015-09-22 MED FILL — IBANDRONATE NA 150 MG TAB: 150 | 90 days supply | Qty: 3 | Fill #1

## 2015-09-23 DIAGNOSIS — Z124 Encounter for screening for malignant neoplasm of cervix: Secondary | ICD-10-CM | POA: Diagnosis not present

## 2015-09-23 DIAGNOSIS — Z01419 Encounter for gynecological examination (general) (routine) without abnormal findings: Secondary | ICD-10-CM | POA: Diagnosis not present

## 2015-09-23 DIAGNOSIS — Z681 Body mass index (BMI) 19 or less, adult: Secondary | ICD-10-CM | POA: Diagnosis not present

## 2015-09-24 MED FILL — BUTALBITAL/APAP/CAFFEINE TB: 50-325-40 | 5 days supply | Qty: 30 | Fill #1

## 2015-10-01 DIAGNOSIS — Z803 Family history of malignant neoplasm of breast: Secondary | ICD-10-CM | POA: Diagnosis not present

## 2015-10-02 MED FILL — BUTALBITAL/APAP/CAFFEINE TB: 50-325-40 | 5 days supply | Qty: 30 | Fill #2

## 2015-10-03 DIAGNOSIS — M4712 Other spondylosis with myelopathy, cervical region: Secondary | ICD-10-CM | POA: Diagnosis not present

## 2015-10-03 DIAGNOSIS — M5030 Other cervical disc degeneration, unspecified cervical region: Secondary | ICD-10-CM | POA: Diagnosis not present

## 2015-10-03 DIAGNOSIS — Z981 Arthrodesis status: Secondary | ICD-10-CM | POA: Diagnosis not present

## 2015-10-03 DIAGNOSIS — M542 Cervicalgia: Secondary | ICD-10-CM | POA: Diagnosis not present

## 2015-10-07 MED FILL — predniSONE 10 MG TABS: 10 | 14 days supply | Qty: 50 | Fill #0

## 2015-10-09 MED FILL — FLUCONAZOLE 150 MG TABLET: 150 | 7 days supply | Qty: 2 | Fill #0

## 2015-10-10 ENCOUNTER — Encounter: Payer: Self-pay | Admitting: Emergency Medicine

## 2015-10-10 ENCOUNTER — Ambulatory Visit
Admission: EM | Admit: 2015-10-10 | Discharge: 2015-10-10 | Disposition: A | Discharge status: Home / Self Care | Payer: 59 | Attending: Family Medicine | Admitting: Family Medicine

## 2015-10-10 DIAGNOSIS — R300 Dysuria: Secondary | ICD-10-CM

## 2015-10-10 LAB — URINALYSIS COMPLETE WITH MICROSCOPIC (ARMC ONLY)
Bilirubin Urine: NEGATIVE
Glucose, UA: NEGATIVE mg/dL
Hgb urine dipstick: NEGATIVE
Ketones, ur: NEGATIVE mg/dL
Nitrite: NEGATIVE
Protein, ur: NEGATIVE mg/dL
Specific Gravity, Urine: <1.005 — ABNORMAL LOW (ref 1.005–1.030)
pH: 6 (ref 5.0–8.0)

## 2015-10-10 MED ORDER — CEPHALEXIN 500 MG PO CAPS: 500.0000 mg | ORAL_CAPSULE | Freq: Two times a day (BID) | ORAL | 0 refills | Status: AC

## 2015-10-10 NOTE — ED Provider Notes (Signed)
[facility] ____________________________________________  Time seen: Approximately 7:24 PM  I have reviewed the triage vital signs and the nursing notes.   HISTORY  Chief Complaint Dysuria  HPI Anne Garrett is a 40 y.o. female present for the complaints of 2 days of urinary frequency, urinary urgency and some ureteral burning sensation. Patient denies burning with urination but states she has a generalized burning sensation at her urethra. Denies any vaginal complaints, vaginal discharge, vaginal odor or vaginal pain. Patient reports that she has had a history of bacterial vaginosis in the past, and states clearly that she knows this is not bacterial vaginosis. Denies concerns of STDs. Denies concern of vaginal infection and denies pelvic exam.  Patient reports that she has had a few urinary tract infections in the past and this feels similar. Denies any known trigger to current urinary symptoms. Denies nausea, vomiting, diarrhea, abdominal pain, back pain, chest pain, shortness of breath, flank pain, dizziness, weakness, recent antibiotic use or recent sickness.  Clarisse Gouge, MD: PCP Reports last menstrual period was one week ago. Denies chance of pregnancy.   Past Medical History:  Diagnosis Date  . Adrenal insufficiency ([place])    secondary to Prednisone  . Eczema    coner oif eye lid  . Headache    Migraine  . Heart murmur    only hear once slight  . Intervertebral disk disease   . Osteoporosis   . Rheumatoid arthritis ([place])    Juvenile onset    Patient Active Problem List   Diagnosis Date Noted  . S/P cervical spinal fusion 07/13/2015  . Cervical stenosis of spinal canal 07/12/2015  . HNP (herniated nucleus pulposus), cervical 06/19/2015  . Numbness 05/20/2015  . Chronic rheumatic arthritis ([place]) 05/20/2015  . Carpal tunnel syndrome 05/20/2015  . Snoring 05/20/2015  . Excessive daytime sleepiness 05/20/2015    Past Surgical History:  Procedure  Laterality Date  . ABDOMINAL SURGERY    . ANKLE ARTHROPLASTY Right   . ANTERIOR CERVICAL DECOMP/DISCECTOMY FUSION N/A 06/19/2015   Procedure: C3-4 C4-5 Anterior cervical decompression/diskectomy/fusion;  Surgeon: Jovita Gamma, MD;  Location: [facility];  Service: Neurosurgery;  Laterality: N/A;  C3-4 C4-5 Anterior cervical decompression/diskectomy/fusion  . APPENDECTOMY    . CARPAL TUNNEL RELEASE Right   . CESAREAN SECTION    . CHOLECYSTECTOMY    . COLONOSCOPY    . ESSURE TUBAL LIGATION     Essure Implant-Permanent birth control  . excision to palm Right    excision of mass to right plam  . JOINT REPLACEMENT Bilateral    bilat hip replacements  . ORIF FEMUR FRACTURE Right   . POSTERIOR CERVICAL FUSION/FORAMINOTOMY N/A 07/12/2015   Procedure: C2 to C5 Cervical laminectomy, C2 to C5 posterior cervical arthrodesis with instrumentation and bone graft;  Surgeon: Jovita Gamma, MD;  Location: [facility];  Service: Neurosurgery;  Laterality: N/A;  C2 to C5 Cervical laminectomy, C2  to C5 posterior cervical arthrodesis with instrumentation and bone graft  . right foot surgery Right    right jfoot arthrodesis and right ankle arthroplasty  . WRIST SURGERY Bilateral    arthrodesis    Current Outpatient Rx  . Order #: [id number] Class: Historical Med  . Order #: [id number] Class: Historical Med  . Order #: [id number] Class: Historical Med  . Order #: [id number] Class: Historical Med  . Order #: [id number] Class: Historical Med  . Order #: [id number] Class: Historical Med  . Order #: [id number] Class: Historical Med  . Order #: [id number] Class: Normal  .  Order #: [id number] Class: Print  . Order #: [id number] Class: Historical Med    No current facility-administered medications for this encounter.   Current Outpatient Prescriptions:  .  butalbital-acetaminophen-caffeine (FIORICET, ESGIC) 50-325-40 MG tablet, Take by mouth 2 (two) times daily as needed for headache., Disp: , Rfl:  .  clonazePAM  (KLONOPIN) 1 MG tablet, Take 0.5 mg by mouth daily as needed for anxiety., Disp: , Rfl:  .  ibandronate (BONIVA) 150 MG tablet, Take 150 mg by mouth every 30 (thirty) days. Take in the morning with a full glass of water, on an empty stomach, and do not take anything else by mouth or lie down for the next 30 min., Disp: , Rfl:  .  Multiple Vitamin (MULTIVITAMIN) tablet, Take 1 tablet by mouth daily., Disp: , Rfl:  .  predniSONE (DELTASONE) 5 MG tablet, Take 5 mg by mouth every evening. , Disp: , Rfl:  .  vitamin C (ASCORBIC ACID) 500 MG tablet, Take 500 mg by mouth daily., Disp: , Rfl:  .  Vitamin D, Ergocalciferol, (DRISDOL) 50000 units CAPS capsule, Take 50,000 Units by mouth every 30 (thirty) days. Takes with Boniva, Disp: , Rfl:  .  cephALEXin (KEFLEX) 500 MG capsule, Take 1 capsule (500 mg total) by mouth 2 (two) times daily., Disp: 14 capsule, Rfl: 0 .  HYDROcodone-acetaminophen (NORCO/VICODIN) 5-325 MG tablet, Take 1-2 tablets by mouth every 4 (four) hours as needed (mild pain)., Disp: 50 tablet, Rfl: 0 .  methocarbamol (ROBAXIN) 500 MG tablet, Take 500 mg by mouth 2 (two) times daily as needed for muscle spasms., Disp: , Rfl:   Allergies Leflunomide; Teriparatide; Methotrexate; Paroxetine hcl; Sulfa antibiotics; and Valproic acid and related  Family History  Problem Relation Age of Onset  . Breast cancer Mother 56  . Nephritis Father   . Epilepsy Father   . Breast cancer Maternal Aunt     2 mat aunts   . Multiple sclerosis Maternal Uncle   . Psoriasis Paternal Aunt     Social History Social History  Substance Use Topics  . Smoking status: Former Smoker    Packs/day: 0.25    Years: 20.00  . Smokeless tobacco: Never Used  . Alcohol use 1.2 oz/week    2 Glasses of wine per week     Comment: social    Review of Systems Constitutional: No fever/chills Eyes: No visual changes. ENT: No sore throat. Cardiovascular: Denies chest pain. Respiratory: Denies shortness of  breath. Gastrointestinal: No abdominal pain.  No nausea, no vomiting.  No diarrhea.  No constipation. Genitourinary: Positive for dysuria. Musculoskeletal: Negative for back pain. Skin: Negative for rash. Neurological: Negative for headaches, focal weakness or numbness.  10-point ROS otherwise negative.  ____________________________________________   PHYSICAL EXAM:  VITAL SIGNS: ED Triage Vitals  Enc Vitals Group     BP 10/10/15 1904 120/81     Pulse Rate 10/10/15 1904 60     Resp 10/10/15 1904 16     Temp 10/10/15 1904 98.6 F (37 C)     Temp Source 10/10/15 1904 Tympanic     SpO2 10/10/15 1904 98 %     Weight 10/10/15 1906 94 lb (42.6 kg)     Height 10/10/15 1906 5' (1.524 m)     Head Circumference --      Peak Flow --      Pain Score --      Pain Loc --      Pain Edu? --      Excl.  in [place]? --     Constitutional: Alert and oriented. Well appearing and in no acute distress. Eyes: Conjunctivae are normal. PERRL. EOMI. ENT      Head: Normocephalic and atraumatic.      Mouth/Throat: Mucous membranes are moist. Cardiovascular: Normal rate, regular rhythm. Grossly normal heart sounds.  Good peripheral circulation. Respiratory: Normal respiratory effort without tachypnea nor retractions. Breath sounds are clear and equal bilaterally. No wheezes/rales/rhonchi.. Gastrointestinal: Soft and nontender. No distention. No CVA tenderness. Musculoskeletal:  Ambulatory with steady gait. No midline cervical, thoracic or lumbar tenderness to palpation.  Neurologic:  Normal speech and language. No gross focal neurologic deficits are appreciated. Speech is normal. No gait instability.  Skin:  Skin is warm, dry and intact. No rash noted. Psychiatric: Mood and affect are normal. Speech and behavior are normal. Patient exhibits appropriate insight and judgment   ___________________________________________   LABS (all labs ordered are listed, but only abnormal results are displayed)  Labs  Reviewed  URINALYSIS COMPLETEWITH MICROSCOPIC ([facility] ONLY) - Abnormal; Notable for the following:       Result Value   Specific Gravity, Urine <1.005 (*)    Leukocytes, UA MODERATE (*)    Bacteria, UA FEW (*)    Squamous Epithelial / LPF 0-5 (*)    All other components within normal limits  URINE CULTURE   ____________________________________________  PROCEDURES Procedures   INITIAL IMPRESSION / ASSESSMENT AND PLAN / ED COURSE  Pertinent labs & imaging results that were available during my care of the patient were reviewed by me and considered in my medical decision making (see chart for details).  Well-appearing patient. No acute chest. Presents for complaints of dysuria 2 days.urinalysis reviewed, a few bacteria, moderate leukocytes. Discussed in detail with patient suspect UTI however concern for vaginal infection, patient denies vaginal issues and states that she does not want to have a pelvic examination she knows that this is not a vaginal issue. Will culture urine. Encouraged follow-up with PCP. Patient allergic to sulfa. Will treat patient with oral cephalexin, encouraged supportive care. Discussed indication, risks and benefits of medications with patient.  Discussed follow up with Primary care physician this week. Discussed follow up and return parameters including no resolution or any worsening concerns. Patient verbalized understanding and agreed to plan.   ____________________________________________   FINAL CLINICAL IMPRESSION(S) / ED DIAGNOSES  Final diagnoses:  Dysuria     Discharge Medication List as of 10/10/2015  8:00 PM    START taking these medications   Details  cephALEXin (KEFLEX) 500 MG capsule Take 1 capsule (500 mg total) by mouth 2 (two) times daily., Starting Fri 10/10/2015, Until Fri 10/17/2015, Normal        Note: This dictation was prepared with Dragon dictation along with smaller phrase technology. Any transcriptional errors that result from  this process are unintentional.    Clinical Course      Marylene Land, NP 10/10/15 2054

## 2015-10-10 NOTE — ED Triage Notes (Signed)
UTI  For 2 days with burning

## 2015-10-10 NOTE — Discharge Instructions (Signed)
Take medication as prescribed. Rest. Drink plenty of fluids.  ° °Follow up with your primary care physician this week as needed. Return to Urgent care for new or worsening concerns.  ° °

## 2015-10-13 LAB — URINE CULTURE: Culture: 80000 — AB

## 2015-10-13 MED FILL — BUTALBITAL/APAP/CAFFEINE TB: 50-325-40 | 5 days supply | Qty: 30 | Fill #3

## 2015-10-15 DIAGNOSIS — M7989 Other specified soft tissue disorders: Secondary | ICD-10-CM | POA: Diagnosis not present

## 2015-10-15 DIAGNOSIS — M150 Primary generalized (osteo)arthritis: Secondary | ICD-10-CM | POA: Diagnosis not present

## 2015-10-15 DIAGNOSIS — M2550 Pain in unspecified joint: Secondary | ICD-10-CM | POA: Diagnosis not present

## 2015-10-15 DIAGNOSIS — R5382 Chronic fatigue, unspecified: Secondary | ICD-10-CM | POA: Diagnosis not present

## 2015-10-15 DIAGNOSIS — M0579 Rheumatoid arthritis with rheumatoid factor of multiple sites without organ or systems involvement: Secondary | ICD-10-CM | POA: Diagnosis not present

## 2015-10-16 DIAGNOSIS — H5213 Myopia, bilateral: Secondary | ICD-10-CM | POA: Diagnosis not present

## 2015-10-28 MED FILL — metroNIDAZOLE 500 MG TABS: 500 | 7 days supply | Qty: 14 | Fill #0

## 2015-11-05 DIAGNOSIS — M150 Primary generalized (osteo)arthritis: Secondary | ICD-10-CM | POA: Diagnosis not present

## 2015-11-05 DIAGNOSIS — M0579 Rheumatoid arthritis with rheumatoid factor of multiple sites without organ or systems involvement: Secondary | ICD-10-CM | POA: Diagnosis not present

## 2015-11-05 DIAGNOSIS — R5382 Chronic fatigue, unspecified: Secondary | ICD-10-CM | POA: Diagnosis not present

## 2015-11-05 DIAGNOSIS — M7989 Other specified soft tissue disorders: Secondary | ICD-10-CM | POA: Diagnosis not present

## 2015-11-05 DIAGNOSIS — M2550 Pain in unspecified joint: Secondary | ICD-10-CM | POA: Diagnosis not present

## 2015-11-05 MED FILL — predniSONE 5 MG TABS: 5 | 90 days supply | Qty: 90 | Fill #1

## 2015-11-11 ENCOUNTER — Other Ambulatory Visit: Payer: Self-pay | Admitting: Family Medicine

## 2015-11-11 DIAGNOSIS — Z1231 Encounter for screening mammogram for malignant neoplasm of breast: Secondary | ICD-10-CM

## 2015-11-13 NOTE — Progress Notes (Signed)
   S: Patient presents today to the [facility].  Patient is currently taking Xeljanz for rheumatoid arthritis. Patient is managed by Leafy Kindle for this.   Adherence: denies any missed doses  Efficacy: just started 10 days ago and has noticed decreased swelling in her ankles.  Dosing:  Rheumatoid arthritis (monotherapy or in combination with nonbiologic disease-modifying antirheumatic drugs (DMARDs): Oral: Extended release: 11 mg once daily   Monitoring: S/sx of infection: denies S/sx of malignancy: denies Bone marrow suppression: CBC 07/2015 WNL GI adverse effects: denies Headache: denies LFTs: last ones 07/2015 and were slightly elevated Cardiovascular effects (decreased HR):   O:     Lab Results  Component Value Date   WBC 5.3 07/12/2015   HGB 12.1 07/12/2015   HCT 37.6 07/12/2015   MCV 101.1 (H) 07/12/2015   PLT 346 07/12/2015      Chemistry      Component Value Date/Time   NA 139 07/12/2015 0702   K 4.1 07/12/2015 0702   CL 109 07/12/2015 0702   CO2 23 07/12/2015 0702   BUN 6 07/12/2015 0702   CREATININE 0.43 (L) 07/12/2015 0702      Component Value Date/Time   CALCIUM 8.2 (L) 07/12/2015 0702   ALKPHOS 152 (H) 05/15/2015 1013   AST 70 (H) 05/15/2015 1013   ALT 57 (H) 05/15/2015 1013   BILITOT 0.2 (L) 05/15/2015 1013       A/P: 1. Medication review: patient currently taking Morrie Sheldon for rheumatoid arthrits and is tolerating it well with no adverse effects and has already noticed rheumatoid arthirits. Last AST/ALT were slightly elevated - need to continue to monitor. CBC good. Reviewed the medications with the patient including the following: Xeljanz (tofacitinib) is a Janus Associated Kinase Inhibitor which prevents cytokine- or growth factor-medicated gene expression and intracellular activity of immune cells, reduces circulating CD16/56+ natural killer cells, serum IgG, IgM, IgA, and C-reactive protein,  and increases B cells. Adverse effects include increased risk of infection, risk of malignancy, as well as bone marrow suppression, GI upset, and decreased heart rate. . Patient should avoid live vaccinations while on this medication. No recommendations for changes at this time   Nicoletta Ba, PharmD, BCPS, [place] [phone]

## 2015-11-14 ENCOUNTER — Ambulatory Visit (HOSPITAL_BASED_OUTPATIENT_CLINIC_OR_DEPARTMENT_OTHER): Payer: Self-pay | Admitting: Pharmacist

## 2015-11-14 DIAGNOSIS — M069 Rheumatoid arthritis, unspecified: Secondary | ICD-10-CM

## 2015-11-14 MED ORDER — TOFACITINIB CITRATE ER 11 MG PO TB24: 1.0000 | ORAL_TABLET | Freq: Every day | ORAL | 3 refills | Status: DC

## 2015-11-22 ENCOUNTER — Emergency Department
Admission: EM | Admit: 2015-11-22 | Discharge: 2015-11-23 | Disposition: A | Discharge status: Home / Self Care | Payer: 59 | Attending: Emergency Medicine | Admitting: Emergency Medicine

## 2015-11-22 ENCOUNTER — Encounter: Payer: Self-pay | Admitting: Emergency Medicine

## 2015-11-22 ENCOUNTER — Emergency Department: Payer: 59

## 2015-11-22 DIAGNOSIS — F17210 Nicotine dependence, cigarettes, uncomplicated: Secondary | ICD-10-CM | POA: Diagnosis not present

## 2015-11-22 DIAGNOSIS — Z79899 Other long term (current) drug therapy: Secondary | ICD-10-CM | POA: Insufficient documentation

## 2015-11-22 DIAGNOSIS — R0602 Shortness of breath: Secondary | ICD-10-CM | POA: Diagnosis present

## 2015-11-22 DIAGNOSIS — R079 Chest pain, unspecified: Secondary | ICD-10-CM | POA: Insufficient documentation

## 2015-11-22 DIAGNOSIS — M79602 Pain in left arm: Secondary | ICD-10-CM | POA: Diagnosis not present

## 2015-11-22 LAB — CBC
HCT: 37.6 % (ref 35.0–47.0)
Hemoglobin: 13 g/dL (ref 12.0–16.0)
MCH: 34.4 pg — ABNORMAL HIGH (ref 26.0–34.0)
MCHC: 34.6 g/dL (ref 32.0–36.0)
MCV: 99.4 fL (ref 80.0–100.0)
Platelets: 397 10*3/uL (ref 150–440)
RBC: 3.78 MIL/uL — ABNORMAL LOW (ref 3.80–5.20)
RDW: 13.3 % (ref 11.5–14.5)
WBC: 8.5 10*3/uL (ref 3.6–11.0)

## 2015-11-22 NOTE — ED Triage Notes (Signed)
Pt states woke up this am with left sided chest pain, pt states as day has progressed she has begun to have left arm/shoulder pain and left lower arm numbness. Pt states she has felt shob and has had nausea. Pt with history of RA and smoking.

## 2015-11-23 ENCOUNTER — Encounter: Payer: Self-pay | Admitting: Radiology

## 2015-11-23 ENCOUNTER — Emergency Department: Payer: 59

## 2015-11-23 DIAGNOSIS — M79602 Pain in left arm: Secondary | ICD-10-CM | POA: Diagnosis not present

## 2015-11-23 DIAGNOSIS — Z79899 Other long term (current) drug therapy: Secondary | ICD-10-CM | POA: Diagnosis not present

## 2015-11-23 DIAGNOSIS — F17210 Nicotine dependence, cigarettes, uncomplicated: Secondary | ICD-10-CM | POA: Diagnosis not present

## 2015-11-23 DIAGNOSIS — R079 Chest pain, unspecified: Secondary | ICD-10-CM | POA: Diagnosis not present

## 2015-11-23 LAB — BASIC METABOLIC PANEL
Anion gap: 11 (ref 5–15)
BUN: 10 mg/dL (ref 6–20)
CO2: 22 mmol/L (ref 22–32)
Calcium: 8.2 mg/dL — ABNORMAL LOW (ref 8.9–10.3)
Chloride: 101 mmol/L (ref 101–111)
Creatinine, Ser: 0.61 mg/dL (ref 0.44–1.00)
GFR calc Af Amer: >60 mL/min (ref >60)
GFR calc non Af Amer: >60 mL/min (ref >60)
Glucose, Bld: 111 mg/dL — ABNORMAL HIGH (ref 65–99)
Potassium: 3.2 mmol/L — ABNORMAL LOW (ref 3.5–5.1)
Sodium: 134 mmol/L — ABNORMAL LOW (ref 135–145)

## 2015-11-23 LAB — HCG, QUANTITATIVE, PREGNANCY: hCG, Beta Chain, Quant, S: <1 m[IU]/mL (ref <5)

## 2015-11-23 LAB — TROPONIN I
Troponin I: <0.03 ng/mL (ref <0.03)
Troponin I: <0.03 ng/mL (ref <0.03)

## 2015-11-23 MED ORDER — FENTANYL CITRATE (PF) 100 MCG/2ML IJ SOLN
50.0000 ug | Freq: Once | INTRAMUSCULAR | Status: AC
  Administered 2015-11-23: 50 ug via INTRAVENOUS
  Filled 2015-11-23: qty 2

## 2015-11-23 MED ORDER — POTASSIUM CHLORIDE CRYS ER 20 MEQ PO TBCR
EXTENDED_RELEASE_TABLET | ORAL | Status: AC
  Administered 2015-11-23: 40 meq via ORAL
  Filled 2015-11-23: qty 2

## 2015-11-23 MED ORDER — DOCUSATE SODIUM 100 MG PO CAPS: ORAL_CAPSULE | ORAL | 0 refills | Status: DC

## 2015-11-23 MED ORDER — IOPAMIDOL (ISOVUE-300) INJECTION 61%: 80.0000 mL | Freq: Once | INTRAVENOUS | Status: DC | PRN

## 2015-11-23 MED ORDER — ONDANSETRON HCL 4 MG/2ML IJ SOLN
4.0000 mg | INTRAMUSCULAR | Status: AC
  Administered 2015-11-23: 4 mg via INTRAVENOUS
  Filled 2015-11-23: qty 2

## 2015-11-23 MED ORDER — IOPAMIDOL (ISOVUE-370) INJECTION 76%
80.0000 mL | Freq: Once | INTRAVENOUS | Status: AC | PRN
  Administered 2015-11-23: 80 mL via INTRAVENOUS

## 2015-11-23 MED ORDER — MORPHINE SULFATE (PF) 4 MG/ML IV SOLN
2.0000 mg | Freq: Once | INTRAVENOUS | Status: AC
  Administered 2015-11-23: 2 mg via INTRAVENOUS
  Filled 2015-11-23: qty 1

## 2015-11-23 MED ORDER — HYDROCODONE-ACETAMINOPHEN 5-325 MG PO TABS
2.0000 | ORAL_TABLET | Freq: Once | ORAL | Status: AC
  Administered 2015-11-23: 2 via ORAL
  Filled 2015-11-23: qty 2

## 2015-11-23 MED ORDER — POTASSIUM CHLORIDE CRYS ER 20 MEQ PO TBCR
40.0000 meq | EXTENDED_RELEASE_TABLET | Freq: Once | ORAL | Status: AC
  Administered 2015-11-23: 40 meq via ORAL

## 2015-11-23 MED ORDER — HYDROCODONE-ACETAMINOPHEN 5-325 MG PO TABS: 1.0000 | ORAL_TABLET | ORAL | 0 refills | Status: DC | PRN

## 2015-11-23 NOTE — Discharge Instructions (Signed)
As we discussed, your workup today was reassuring.  Though we do not know exactly what is causing your symptoms, it appears that you have no emergent medical condition at this time and that you are safe to go home and follow up as recommended in this paperwork. ° °Please return immediately to the Emergency Department if you develop any new or worsening symptoms that concern you. ° °

## 2015-11-23 NOTE — ED Provider Notes (Signed)
[facility] Emergency Department Provider Note  ____________________________________________   First MD Initiated Contact with Patient 11/23/15 [phone]     (approximate)  I have reviewed the triage vital signs and the nursing notes.   HISTORY  Chief Complaint Chest Pain and Shortness of Breath    HPI Anne Garrett is a 40 y.o. female with a history of severe rheumatoid arthritis diagnosed when she was 40 years old but with no other chronic medical conditionswho presents for evaluation of relatively acute onset left-sided chest pain/pressure and pain radiating from her left shoulder to her shoulder blade and down her left upper extremity.  She is also having some numbness in her left upper extremity.  She identifies the pain in her chest as severe and a combination of sharp pain and pressure on the left side.  She also describes the pain as severe down in her arm and unlike pain she has had in the past with her rheumatoid arthritis.  She has had cervical spinal fusion surgery in the past and had radiculopathy before the surgery and states that this feels different.  She does not have weakness in her arm, just the pain.  Both the chest pain and the arm pain or new symptoms for her.  She thought at first that it was just related to her rheumatoid arthritis but then she was taking a shower thinking that this might make her feel better and she had acute worsening of his symptoms with associated shortness of breath.  She no longer has any shortness of breath.  She has no history of blood clots and does not take birth control pills.  She does smoke cigarettes every day.  She denies any personal cardiac history and has no first-degree relatives that have ever had heart attacks.  She does not have hypertension, diabetes, or hyperlipidemia.  He has a small, non-marfanoid stature/habitus.   Past Medical History:  Diagnosis Date  . Adrenal insufficiency ([place])    secondary to  Prednisone  . Collagen vascular disease ([place])   . Eczema    coner oif eye lid  . Headache    Migraine  . Heart murmur    only hear once slight  . Intervertebral disk disease   . Osteoporosis   . Rheumatoid arthritis ([place])    Juvenile onset    Patient Active Problem List   Diagnosis Date Noted  . S/P cervical spinal fusion 07/13/2015  . Cervical stenosis of spinal canal 07/12/2015  . HNP (herniated nucleus pulposus), cervical 06/19/2015  . Numbness 05/20/2015  . Chronic rheumatic arthritis ([place]) 05/20/2015  . Carpal tunnel syndrome 05/20/2015  . Snoring 05/20/2015  . Excessive daytime sleepiness 05/20/2015    Past Surgical History:  Procedure Laterality Date  . ABDOMINAL SURGERY    . ANKLE ARTHROPLASTY Right   . ANTERIOR CERVICAL DECOMP/DISCECTOMY FUSION N/A 06/19/2015   Procedure: C3-4 C4-5 Anterior cervical decompression/diskectomy/fusion;  Surgeon: Jovita Gamma, MD;  Location: [place] [facility];  Service: Neurosurgery;  Laterality: N/A;  C3-4 C4-5 Anterior cervical decompression/diskectomy/fusion  . APPENDECTOMY    . CARPAL TUNNEL RELEASE Right   . CESAREAN SECTION    . CHOLECYSTECTOMY    . COLONOSCOPY    . ESSURE TUBAL LIGATION     Essure Implant-Permanent birth control  . excision to palm Right    excision of mass to right plam  . JOINT REPLACEMENT Bilateral    bilat hip replacements  . ORIF FEMUR FRACTURE Right   . POSTERIOR CERVICAL  FUSION/FORAMINOTOMY N/A 07/12/2015   Procedure: C2 to C5 Cervical laminectomy, C2 to C5 posterior cervical arthrodesis with instrumentation and bone graft;  Surgeon: Jovita Gamma, MD;  Location: [place];  Service: Neurosurgery;  Laterality: N/A;  C2 to C5 Cervical laminectomy, C2  to C5 posterior cervical arthrodesis with instrumentation and bone graft  . right foot surgery Right    right jfoot arthrodesis and right ankle arthroplasty  . WRIST SURGERY Bilateral    arthrodesis    Prior to Admission medications   Medication Sig  Start Date End Date Taking? Authorizing Provider  butalbital-acetaminophen-caffeine (FIORICET, ESGIC) 50-325-40 MG tablet Take 1 tablet by mouth 2 (two) times daily as needed for headache.    Yes Historical Provider, MD  Calcium Carbonate-Vitamin D (CALCIUM 500 + D) 500-125 MG-UNIT TABS Take 1 tablet by mouth daily.   Yes Historical Provider, MD  clonazePAM (KLONOPIN) 1 MG tablet Take 0.5 mg by mouth daily as needed for anxiety.   Yes Historical Provider, MD  ibandronate (BONIVA) 150 MG tablet Take 150 mg by mouth every 30 (thirty) days. Take in the morning with a full glass of water, on an empty stomach, and do not take anything else by mouth or lie down for the next 30 min.   Yes Historical Provider, MD  Multiple Vitamin (MULTIVITAMIN) tablet Take 1 tablet by mouth daily.   Yes Historical Provider, MD  predniSONE (DELTASONE) 5 MG tablet Take 5 mg by mouth every evening.    Yes Historical Provider, MD  Tofacitinib Citrate (XELJANZ XR) 11 MG TB24 Take 1 tablet by mouth daily. 11/14/15  Yes Tresa Garter, MD  vitamin C (ASCORBIC ACID) 500 MG tablet Take 500 mg by mouth daily.   Yes Historical Provider, MD  Vitamin D, Ergocalciferol, (DRISDOL) 50000 units CAPS capsule Take 50,000 Units by mouth every 30 (thirty) days. Takes with Boniva   Yes Historical Provider, MD  docusate sodium (COLACE) 100 MG capsule Take 1 tablet once or twice daily as needed for constipation while taking narcotic pain medicine 11/23/15   Hinda Kehr, MD  HYDROcodone-acetaminophen (NORCO/VICODIN) 5-325 MG tablet Take 1-2 tablets by mouth every 4 (four) hours as needed for moderate pain. 11/23/15   Hinda Kehr, MD    Allergies Leflunomide; Teriparatide; Methotrexate; Paroxetine hcl; Sulfa antibiotics; and Valproic acid and related  Family History  Problem Relation Age of Onset  . Breast cancer Mother 63  . Nephritis Father   . Epilepsy Father   . Breast cancer Maternal Aunt     2 mat aunts   . Multiple sclerosis  Maternal Uncle   . Psoriasis Paternal Aunt     Social History Social History  Substance Use Topics  . Smoking status: Current Every Day Smoker    Packs/day: 0.25    Years: 20.00    Types: Cigarettes  . Smokeless tobacco: Never Used  . Alcohol use 1.2 oz/week    2 Glasses of wine per week     Comment: social    Review of Systems Constitutional: No fever/chills Eyes: No visual changes. ENT: No sore throat. Cardiovascular: +chest pain radiating into left shoulder blade and down LUE Respiratory: transient shortness of breath associated with chest pain Gastrointestinal: No abdominal pain.  No nausea, no vomiting.  No diarrhea.  No constipation. Genitourinary: Negative for dysuria. Musculoskeletal: Pain in left shoulder blade, down LUE, identifies two specific areas of pain in her left arm (points at mid-forearm and mid-humerus) Skin: Negative for rash. Neurological: Headache earlier today,  now improved.  No focal weakness.  Some numbness in LUE.  10-point ROS otherwise negative.  ____________________________________________   PHYSICAL EXAM:  VITAL SIGNS: ED Triage Vitals  Enc Vitals Group     BP 11/22/15 2309 (!) 130/92     Pulse Rate 11/22/15 2309 (!) 104     Resp 11/22/15 2305 14     Temp 11/22/15 2309 98.9 F (37.2 C)     Temp Source 11/22/15 2309 Oral     SpO2 11/22/15 2309 97 %     Weight 11/22/15 2305 95 lb (43.1 kg)     Height 11/22/15 2305 5' (1.524 m)     Head Circumference --      Peak Flow --      Pain Score 11/22/15 2306 10     Pain Loc --      Pain Edu? --      Excl. in [place]? --     Constitutional: Alert and oriented. Well appearing and in no acute distress. Eyes: Conjunctivae are normal. PERRL. EOMI. Head: Atraumatic. Nose: No congestion/rhinnorhea. Mouth/Throat: Mucous membranes are moist.  Oropharynx non-erythematous. Neck: No stridor.  No meningeal signs.   Cardiovascular: Mild tachycardia, regular rhythm. Good peripheral circulation. Grossly  normal heart sounds. Respiratory: Normal respiratory effort.  No retractions. Lungs CTAB. Gastrointestinal: Soft and nontender. No distention.  Musculoskeletal: No lower extremity tenderness nor edema. No gross deformities of extremities. Neurologic:  Normal speech and language. No gross focal neurologic deficits are appreciated with normal strength throughout extremities. Skin:  Skin is warm, dry and intact. No rash noted. Psychiatric: Mood and affect are normal. Speech and behavior are normal.  ____________________________________________   LABS (all labs ordered are listed, but only abnormal results are displayed)  Labs Reviewed  BASIC METABOLIC PANEL - Abnormal; Notable for the following:       Result Value   Sodium 134 (*)    Potassium 3.2 (*)    Glucose, Bld 111 (*)    Calcium 8.2 (*)    All other components within normal limits  CBC - Abnormal; Notable for the following:    RBC 3.78 (*)    MCH 34.4 (*)    All other components within normal limits  TROPONIN I  HCG, QUANTITATIVE, PREGNANCY  TROPONIN I   ____________________________________________  EKG  ED ECG REPORT I, Marigene Erler, the attending physician, personally viewed and interpreted this ECG.  Date: 11/22/2015 EKG Time: 23:07 Rate: 99 Rhythm:  Borderline sinus tachycardia QRS Axis: normal Intervals: normal ST/T Wave abnormalities: normal Conduction Disturbances: none Narrative Interpretation: unremarkable  ____________________________________________  RADIOLOGY   Dg Chest 2 View  Result Date: 11/22/2015 CLINICAL DATA:  Awakened this morning with left-sided substernal chest pain extending into the left upper extremity. New onset dyspnea. EXAM: CHEST  2 VIEW COMPARISON:  None. FINDINGS: The lungs are clear. The pulmonary vasculature is normal. Heart size is normal. Hilar and mediastinal contours are unremarkable. There is no pleural effusion. Incidental findings include severe glenohumeral arthritis  bilaterally. IMPRESSION: 1. No acute cardiopulmonary findings. 2. Severe glenohumeral arthritis bilaterally. Electronically Signed   By: Andreas Newport M.D.   On: 11/22/2015 23:47   Ct Angio Chest Aorta W And/or Wo Contrast  Result Date: 11/23/2015 CLINICAL DATA:  Awakened with left-sided chest pain. EXAM: CT ANGIOGRAPHY CHEST WITH CONTRAST TECHNIQUE: Multidetector CT imaging of the chest was performed using the standard protocol during bolus administration of intravenous contrast. Multiplanar CT image reconstructions and MIPs were obtained to evaluate the vascular anatomy.  CONTRAST:  80 mL Isovue 370 intravenous COMPARISON:  Radiographs 11/22/2015 FINDINGS: Cardiovascular: Satisfactory opacification of the pulmonary arteries to the segmental level. No evidence of pulmonary embolism. The thoracic aorta is normal in caliber and intact. Normal heart size. No pericardial effusion. Mediastinum/Nodes: No enlarged mediastinal, hilar, or axillary lymph nodes. Thyroid gland, trachea, and esophagus demonstrate no significant findings. Lungs/Pleura: Lungs are clear. No pleural effusion or pneumothorax. Upper Abdomen: No acute abnormality. Musculoskeletal: No chest wall abnormality. No acute or significant osseous findings. Review of the MIP images confirms the above findings. IMPRESSION: Normal chest CT.  No acute vascular abnormality. Electronically Signed   By: Andreas Newport M.D.   On: 11/23/2015 02:41    ____________________________________________   PROCEDURES  Procedure(s) performed:   Procedures   Critical Care performed: No ____________________________________________   INITIAL IMPRESSION / ASSESSMENT AND PLAN / ED COURSE  Pertinent labs & imaging results that were available during my care of the patient were reviewed by me and considered in my medical decision making (see chart for details).  The patient's symptoms are somewhat concerning although she is at low risk for ACS and for  PE.  She has persistent borderline tachycardia so the PERC rule does not apply, but her Wells score for PE is 1.5 for her tachycardia only.  However given the acute onset of her symptoms and the fact that there seemed to be some neurological symptoms and her upper extremity associated with the chest pain, must consider aortic dissection even though her blood pressures are consistently between left and right upper extremity (I checked both while I was in the room and they are both approx 115/75).  However this is a potentially life threatening condition and I do not have a better explanation for her combination of chest and back pain with pain radiating down her left arm and associated numbness.  I discussed this with her and she agrees to the CTA chest.  Giving small dose of morphine 2 mg IV because patient states she is extremely sensitive to morphine but does need some pain relief.  ____________________________________________  FINAL CLINICAL IMPRESSION(S) / ED DIAGNOSES  Final diagnoses:  Chest pain, unspecified type  Left arm pain     MEDICATIONS GIVEN DURING THIS VISIT:  Medications  HYDROcodone-acetaminophen (NORCO/VICODIN) 5-325 MG per tablet 2 tablet (not administered)  morphine 4 MG/ML injection 2 mg (2 mg Intravenous Given 11/23/15 0136)  ondansetron (ZOFRAN) injection 4 mg (4 mg Intravenous Given 11/23/15 0136)  morphine 4 MG/ML injection 2 mg (2 mg Intravenous Given 11/23/15 0238)  iopamidol (ISOVUE-370) 76 % injection 80 mL (80 mLs Intravenous Contrast Given 11/23/15 0216)  fentaNYL (SUBLIMAZE) injection 50 mcg (50 mcg Intravenous Given 11/23/15 0330)  potassium chloride SA (K-DUR,KLOR-CON) CR tablet 40 mEq (40 mEq Oral Given 11/23/15 0328)     NEW OUTPATIENT MEDICATIONS STARTED DURING THIS VISIT:  New Prescriptions   DOCUSATE SODIUM (COLACE) 100 MG CAPSULE    Take 1 tablet once or twice daily as needed for constipation while taking narcotic pain medicine    HYDROCODONE-ACETAMINOPHEN (NORCO/VICODIN) 5-325 MG TABLET    Take 1-2 tablets by mouth every 4 (four) hours as needed for moderate pain.    Modified Medications   No medications on file    Discontinued Medications   HYDROCODONE-ACETAMINOPHEN (NORCO/VICODIN) 5-325 MG TABLET    Take 1-2 tablets by mouth every 4 (four) hours as needed (mild pain).   METHOCARBAMOL (ROBAXIN) 500 MG TABLET    Take 500 mg by mouth 2 (  two) times daily as needed for muscle spasms.     Note:  This document was prepared using Dragon voice recognition software and may include unintentional dictation errors.    Hinda Kehr, MD 11/23/15 [phone]

## 2015-11-23 NOTE — ED Notes (Signed)
Patient transported to CT 

## 2015-11-24 ENCOUNTER — Ambulatory Visit
Admission: RE | Admit: 2015-11-24 | Discharge: 2015-11-24 | Disposition: A | Discharge status: Home / Self Care | Payer: 59 | Source: Ambulatory Visit | Attending: Family Medicine | Admitting: Family Medicine

## 2015-11-24 DIAGNOSIS — Z1231 Encounter for screening mammogram for malignant neoplasm of breast: Secondary | ICD-10-CM | POA: Diagnosis not present

## 2015-12-02 MED FILL — BUTALBITAL/APAP/CAFFEINE TB: 50-325-40 | 5 days supply | Qty: 30 | Fill #0

## 2015-12-04 MED FILL — XELJANZ XR 11 MG TB24: 11 | 30 days supply | Qty: 30 | Fill #0

## 2015-12-05 MED FILL — IBANDRONATE NA 150 MG TAB: 150 | 90 days supply | Qty: 3 | Fill #2

## 2015-12-05 MED FILL — VIT D2 1.25 MG (50,000 UNIT: 1.25 MG | 84 days supply | Qty: 12 | Fill #0

## 2015-12-08 DIAGNOSIS — R21 Rash and other nonspecific skin eruption: Secondary | ICD-10-CM | POA: Diagnosis not present

## 2015-12-08 DIAGNOSIS — G43009 Migraine without aura, not intractable, without status migrainosus: Secondary | ICD-10-CM | POA: Diagnosis not present

## 2015-12-12 MED FILL — BUTALBITAL/APAP/CAFFEINE TB: 50-325-40 | 5 days supply | Qty: 30 | Fill #1

## 2015-12-15 DIAGNOSIS — K0402 Irreversible pulpitis: Secondary | ICD-10-CM | POA: Diagnosis not present

## 2015-12-19 MED FILL — BUTALBITAL/APAP/CAFFEINE TB: 50-325-40 | 5 days supply | Qty: 30 | Fill #2

## 2015-12-22 DIAGNOSIS — G4733 Obstructive sleep apnea (adult) (pediatric): Secondary | ICD-10-CM | POA: Diagnosis not present

## 2015-12-23 DIAGNOSIS — M5030 Other cervical disc degeneration, unspecified cervical region: Secondary | ICD-10-CM | POA: Diagnosis not present

## 2015-12-23 DIAGNOSIS — M4712 Other spondylosis with myelopathy, cervical region: Secondary | ICD-10-CM | POA: Diagnosis not present

## 2015-12-23 DIAGNOSIS — Z981 Arthrodesis status: Secondary | ICD-10-CM | POA: Diagnosis not present

## 2015-12-26 MED FILL — BUTALBITAL/APAP/CAFFEINE TB: 50-325-40 | 5 days supply | Qty: 30 | Fill #3

## 2015-12-31 DIAGNOSIS — R5382 Chronic fatigue, unspecified: Secondary | ICD-10-CM | POA: Diagnosis not present

## 2015-12-31 DIAGNOSIS — M0579 Rheumatoid arthritis with rheumatoid factor of multiple sites without organ or systems involvement: Secondary | ICD-10-CM | POA: Diagnosis not present

## 2015-12-31 DIAGNOSIS — M7989 Other specified soft tissue disorders: Secondary | ICD-10-CM | POA: Diagnosis not present

## 2015-12-31 DIAGNOSIS — M4712 Other spondylosis with myelopathy, cervical region: Secondary | ICD-10-CM | POA: Diagnosis not present

## 2015-12-31 DIAGNOSIS — Z681 Body mass index (BMI) 19 or less, adult: Secondary | ICD-10-CM | POA: Diagnosis not present

## 2015-12-31 DIAGNOSIS — M2550 Pain in unspecified joint: Secondary | ICD-10-CM | POA: Diagnosis not present

## 2015-12-31 DIAGNOSIS — M150 Primary generalized (osteo)arthritis: Secondary | ICD-10-CM | POA: Diagnosis not present

## 2015-12-31 DIAGNOSIS — M4322 Fusion of spine, cervical region: Secondary | ICD-10-CM | POA: Diagnosis not present

## 2016-01-01 DIAGNOSIS — G4733 Obstructive sleep apnea (adult) (pediatric): Secondary | ICD-10-CM | POA: Diagnosis not present

## 2016-01-02 MED FILL — BUTALBITAL/APAP/CAFFEINE TB: 50-325-40 | 5 days supply | Qty: 30 | Fill #0

## 2016-01-05 HISTORY — PX: OTHER SURGICAL HISTORY: SHX169

## 2016-01-09 MED FILL — BUTALBITAL/APAP/CAFFEINE TB: 50-325-40 | 5 days supply | Qty: 30 | Fill #1

## 2016-01-12 DIAGNOSIS — Z981 Arthrodesis status: Secondary | ICD-10-CM | POA: Diagnosis not present

## 2016-01-12 DIAGNOSIS — M4712 Other spondylosis with myelopathy, cervical region: Secondary | ICD-10-CM | POA: Diagnosis not present

## 2016-01-12 DIAGNOSIS — M4312 Spondylolisthesis, cervical region: Secondary | ICD-10-CM | POA: Diagnosis not present

## 2016-01-12 DIAGNOSIS — M5030 Other cervical disc degeneration, unspecified cervical region: Secondary | ICD-10-CM | POA: Diagnosis not present

## 2016-01-16 MED FILL — BUTALBITAL/APAP/CAFFEINE TB: 50-325-40 | 5 days supply | Qty: 30 | Fill #2

## 2016-01-20 MED FILL — predniSONE 5 MG TABS: 5 | 90 days supply | Qty: 90 | Fill #2

## 2016-01-23 MED FILL — BUTALBITAL/APAP/CAFFEINE TB: 50-325-40 | 5 days supply | Qty: 30 | Fill #3

## 2016-01-27 MED FILL — XELJANZ XR 11 MG TB24: 11 | 30 days supply | Qty: 30 | Fill #1

## 2016-02-04 DIAGNOSIS — G4733 Obstructive sleep apnea (adult) (pediatric): Secondary | ICD-10-CM | POA: Diagnosis not present

## 2016-02-11 DIAGNOSIS — G4733 Obstructive sleep apnea (adult) (pediatric): Secondary | ICD-10-CM | POA: Diagnosis not present

## 2016-02-12 MED FILL — METHOCARBAMOL 500 MG TABLET: 500 | 10 days supply | Qty: 30 | Fill #0

## 2016-02-16 MED FILL — BUTALBITAL/APAP/CAFFEINE TB: 50-325-40 | 5 days supply | Qty: 30 | Fill #0

## 2016-02-18 DIAGNOSIS — R030 Elevated blood-pressure reading, without diagnosis of hypertension: Secondary | ICD-10-CM | POA: Diagnosis not present

## 2016-02-18 DIAGNOSIS — F439 Reaction to severe stress, unspecified: Secondary | ICD-10-CM | POA: Diagnosis not present

## 2016-02-19 MED FILL — busPIRone HCL 5 MG TABS: 5 | 30 days supply | Qty: 60 | Fill #0

## 2016-02-24 MED FILL — XELJANZ XR 11 MG TB24: 11 | 30 days supply | Qty: 30 | Fill #2

## 2016-02-24 MED FILL — BUTALBITAL/APAP/CAFFEINE TB: 50-325-40 | 5 days supply | Qty: 30 | Fill #1

## 2016-03-02 MED FILL — BUTALBITAL/APAP/CAFFEINE TB: 50-325-40 | 5 days supply | Qty: 30 | Fill #2

## 2016-03-10 DIAGNOSIS — Z79899 Other long term (current) drug therapy: Secondary | ICD-10-CM | POA: Diagnosis not present

## 2016-03-10 DIAGNOSIS — M2550 Pain in unspecified joint: Secondary | ICD-10-CM | POA: Diagnosis not present

## 2016-03-10 DIAGNOSIS — M545 Low back pain: Secondary | ICD-10-CM | POA: Diagnosis not present

## 2016-03-10 DIAGNOSIS — Z681 Body mass index (BMI) 19 or less, adult: Secondary | ICD-10-CM | POA: Diagnosis not present

## 2016-03-10 DIAGNOSIS — M150 Primary generalized (osteo)arthritis: Secondary | ICD-10-CM | POA: Diagnosis not present

## 2016-03-10 DIAGNOSIS — M0579 Rheumatoid arthritis with rheumatoid factor of multiple sites without organ or systems involvement: Secondary | ICD-10-CM | POA: Diagnosis not present

## 2016-03-11 MED FILL — BUTALBITAL/APAP/CAFFEINE TB: 50-325-40 | 5 days supply | Qty: 30 | Fill #3

## 2016-03-17 DIAGNOSIS — M4316 Spondylolisthesis, lumbar region: Secondary | ICD-10-CM | POA: Diagnosis not present

## 2016-03-17 DIAGNOSIS — M549 Dorsalgia, unspecified: Secondary | ICD-10-CM | POA: Diagnosis not present

## 2016-03-17 DIAGNOSIS — M47816 Spondylosis without myelopathy or radiculopathy, lumbar region: Secondary | ICD-10-CM | POA: Diagnosis not present

## 2016-03-17 DIAGNOSIS — M5416 Radiculopathy, lumbar region: Secondary | ICD-10-CM | POA: Diagnosis not present

## 2016-03-17 DIAGNOSIS — M546 Pain in thoracic spine: Secondary | ICD-10-CM | POA: Diagnosis not present

## 2016-03-17 DIAGNOSIS — M5136 Other intervertebral disc degeneration, lumbar region: Secondary | ICD-10-CM | POA: Diagnosis not present

## 2016-03-19 DIAGNOSIS — M545 Low back pain: Secondary | ICD-10-CM | POA: Diagnosis not present

## 2016-03-19 DIAGNOSIS — M4316 Spondylolisthesis, lumbar region: Secondary | ICD-10-CM | POA: Diagnosis not present

## 2016-03-23 MED FILL — BUTALBITAL/APAP/CAFFEINE TB: 50-325-40 | 5 days supply | Qty: 30 | Fill #0

## 2016-03-24 ENCOUNTER — Other Ambulatory Visit: Payer: Self-pay | Admitting: Neurosurgery

## 2016-03-24 DIAGNOSIS — M5416 Radiculopathy, lumbar region: Secondary | ICD-10-CM | POA: Diagnosis not present

## 2016-03-24 DIAGNOSIS — M48062 Spinal stenosis, lumbar region with neurogenic claudication: Secondary | ICD-10-CM | POA: Diagnosis not present

## 2016-03-24 DIAGNOSIS — M4316 Spondylolisthesis, lumbar region: Secondary | ICD-10-CM | POA: Diagnosis not present

## 2016-03-24 DIAGNOSIS — M5136 Other intervertebral disc degeneration, lumbar region: Secondary | ICD-10-CM | POA: Diagnosis not present

## 2016-03-24 DIAGNOSIS — M47816 Spondylosis without myelopathy or radiculopathy, lumbar region: Secondary | ICD-10-CM | POA: Diagnosis not present

## 2016-03-26 MED FILL — busPIRone HCL 5 MG TABS: 5 | 30 days supply | Qty: 60 | Fill #1

## 2016-03-29 DIAGNOSIS — F439 Reaction to severe stress, unspecified: Secondary | ICD-10-CM | POA: Diagnosis not present

## 2016-03-29 DIAGNOSIS — G43009 Migraine without aura, not intractable, without status migrainosus: Secondary | ICD-10-CM | POA: Diagnosis not present

## 2016-03-29 DIAGNOSIS — R030 Elevated blood-pressure reading, without diagnosis of hypertension: Secondary | ICD-10-CM | POA: Diagnosis not present

## 2016-03-30 MED FILL — BUTALBITAL/APAP/CAFFEINE TB: 50-325-40 | 5 days supply | Qty: 30 | Fill #1

## 2016-03-30 MED FILL — XELJANZ XR 11 MG TB24: 11 | 30 days supply | Qty: 30 | Fill #3

## 2016-03-31 ENCOUNTER — Encounter (HOSPITAL_COMMUNITY): Payer: Self-pay

## 2016-03-31 ENCOUNTER — Encounter (HOSPITAL_COMMUNITY)
Admission: RE | Admit: 2016-03-31 | Discharge: 2016-03-31 | Disposition: A | Discharge status: Home / Self Care | Payer: 59 | Source: Ambulatory Visit | Attending: Neurosurgery | Admitting: Neurosurgery

## 2016-03-31 DIAGNOSIS — Z01812 Encounter for preprocedural laboratory examination: Secondary | ICD-10-CM | POA: Diagnosis not present

## 2016-03-31 LAB — CBC
HCT: 39.2 % (ref 36.0–46.0)
Hemoglobin: 12.7 g/dL (ref 12.0–15.0)
MCH: 33 pg (ref 26.0–34.0)
MCHC: 32.4 g/dL (ref 30.0–36.0)
MCV: 101.8 fL — ABNORMAL HIGH (ref 78.0–100.0)
Platelets: 331 10*3/uL (ref 150–400)
RBC: 3.85 MIL/uL — ABNORMAL LOW (ref 3.87–5.11)
RDW: 12.7 % (ref 11.5–15.5)
WBC: 6.3 10*3/uL (ref 4.0–10.5)

## 2016-03-31 LAB — COMPREHENSIVE METABOLIC PANEL
ALT: 10 U/L — ABNORMAL LOW (ref 14–54)
AST: 16 U/L (ref 15–41)
Albumin: 3.9 g/dL (ref 3.5–5.0)
Alkaline Phosphatase: 87 U/L (ref 38–126)
Anion gap: 9 (ref 5–15)
BUN: 8 mg/dL (ref 6–20)
CO2: 22 mmol/L (ref 22–32)
Calcium: 8.7 mg/dL — ABNORMAL LOW (ref 8.9–10.3)
Chloride: 107 mmol/L (ref 101–111)
Creatinine, Ser: 0.46 mg/dL (ref 0.44–1.00)
GFR calc Af Amer: >60 mL/min (ref >60)
GFR calc non Af Amer: >60 mL/min (ref >60)
Glucose, Bld: 78 mg/dL (ref 65–99)
Potassium: 4.1 mmol/L (ref 3.5–5.1)
Sodium: 138 mmol/L (ref 135–145)
Total Bilirubin: 0.2 mg/dL — ABNORMAL LOW (ref 0.3–1.2)
Total Protein: 6.7 g/dL (ref 6.5–8.1)

## 2016-03-31 LAB — SURGICAL PCR SCREEN
MRSA, PCR: NEGATIVE
Staphylococcus aureus: NEGATIVE

## 2016-03-31 LAB — HCG, SERUM, QUALITATIVE: Preg, Serum: NEGATIVE

## 2016-03-31 LAB — TYPE AND SCREEN
ABO/RH(D): A POS
Antibody Screen: NEGATIVE

## 2016-03-31 LAB — ABO/RH: ABO/RH(D): A POS

## 2016-03-31 MED FILL — predniSONE 5 MG TABS: 5 | 90 days supply | Qty: 180 | Fill #0

## 2016-03-31 NOTE — Pre-Procedure Instructions (Addendum)
    Anne Garrett  03/31/2016      [place], [place] - [address] [facility]. [address] [place] [address] Phone: [phone] Fax: [phone]  [id number] - [place], [place] - [address] [place] [address] Phone: [phone] Fax: [phone]    Your procedure is scheduled on Thursday, April 08, 2016  Report to [facility] at 5:30 A.M.  Call this number if you have problems the morning of surgery:  [phone]   Remember:  Do not eat food or drink liquids after midnight Wednesday, April 07, 2016  Take these medicines the morning of surgery with A SIP OF WATER : busPIRone (BUSPAR), if needed: FIORICET, ESGIC) for headache, clonazePAM (KLONOPIN) for anxiety  Stop taking Aspirin, vitamins, fish oil and herbal medications. Do not take any NSAIDs ie: Ibuprofen, Advil, Naproxen, BC and Goody Powder or any medication containing Aspirin; stop Thursday, April 01, 2016  Do not wear jewelry, make-up or nail polish.  Do not wear lotions, powders, or perfumes, or deoderant.  Do not shave 48 hours prior to surgery.  Men may shave face and neck.  Do not bring valuables to the hospital.  [facility] is not responsible for any belongings or valuables.  Contacts, dentures or bridgework may not be worn into surgery.  Leave your suitcase in the car.  After surgery it may be brought to your room.  For patients admitted to the hospital, discharge time will be determined by your treatment team.  Patients discharged the day of surgery will not be allowed to drive home.    Special instructions: Shower the night before surgery and the morning of surgery with CHG.  Please read over the following fact sheets that you were given. Pain Booklet, Coughing and Deep Breathing, Blood Transfusion Information, MRSA Information and Surgical Site Infection Prevention

## 2016-03-31 NOTE — Progress Notes (Signed)
PCP: Dr. Burley Saver @ [facility] in [facility]  Pt. Stated she is stopping xeljanz 1 week prior to surgery.

## 2016-04-01 ENCOUNTER — Other Ambulatory Visit: Payer: Self-pay

## 2016-04-01 NOTE — Patient Outreach (Addendum)
[facility]) Care Management  04/01/2016  Anne Garrett 26-Mar-1975 [id number]  Subjective: Telephone call to patient. Discussed UMR pre-op follow up. Patient voices understanding and agrees to pre-op call.    Objective: per chart review-Patient with spinal stenosis, lumbar region with neurogenic claudication scheduled for L5-S1, Decompression, Posterior Lumbar Interbody fusion.     Assessment: Received UMR pre-op referral. Pre-Op call completed.  FMLA-client reports she has completed FMLA paperwork. RNCM encouraged client to have copies for her record.  Discussed short-term/long term disability and Hospital Indemnity plan. RNCM encouraged client to follow up with Benefits service office if she has any questions.  Post procedure equipment/home health-Client denies Home health or equipment needs. RNCM reinforced that the inpatient case manager in the hospital will arrange if she has any of these needs prior to discharge.   RNCM discussed [place] benefit is higher when using a [facility] facility/pharmacy. Client voiced understanding.    Support:  Client reports she has transportation to her follow up appointment. Client also reports that she has someone to assist with pharmacy needs. RNCM reinforced if she is discharged home on a weekend that she will have to use a local pharmacy.     Discussed Advanced Directives. RNCM reinforced that Spiritual care department available to assist [employer] employees with Advanced Directives as needed.  No other medical issues identified and no additional community resource information needs at this time.   Patient is agreeable to follow up post procedure call.  Plan: telephonic RNCM will follow up post discharge within 3 business days of notification.  Thea Silversmith, RN, MSN, [place] Coordinator Cell: [phone]

## 2016-04-07 NOTE — Anesthesia Preprocedure Evaluation (Addendum)
Anesthesia Evaluation  Patient identified by MRN, date of birth, ID band Patient awake    Reviewed: Allergy & Precautions, NPO status , Patient's Chart, lab work & pertinent test results  Airway Mallampati: II  TM Distance: >3 FB Neck ROM: Limited    Dental  (+) Dental Advisory Given   Pulmonary former smoker,    breath sounds clear to auscultation       Cardiovascular negative cardio ROS   Rhythm:Regular Rate:Normal     Neuro/Psych  Headaches,    GI/Hepatic negative GI ROS, Neg liver ROS,   Endo/Other  negative endocrine ROS  Renal/GU negative Renal ROS     Musculoskeletal  (+) Arthritis , Rheumatoid disorders,    Abdominal   Peds  Hematology negative hematology ROS (+)   Anesthesia Other Findings   Reproductive/Obstetrics                            Lab Results  Component Value Date   WBC 6.3 03/31/2016   HGB 12.7 03/31/2016   HCT 39.2 03/31/2016   MCV 101.8 (H) 03/31/2016   PLT 331 03/31/2016   Lab Results  Component Value Date   CREATININE 0.46 03/31/2016   BUN 8 03/31/2016   NA 138 03/31/2016   K 4.1 03/31/2016   CL 107 03/31/2016   CO2 22 03/31/2016    Anesthesia Physical Anesthesia Plan  ASA: II  Anesthesia Plan: General   Post-op Pain Management:    Induction: Intravenous  Airway Management Planned: Oral ETT  Additional Equipment:   Intra-op Plan:   Post-operative Plan: Extubation in OR  Informed Consent: I have reviewed the patients History and Physical, chart, labs and discussed the procedure including the risks, benefits and alternatives for the proposed anesthesia with the patient or authorized representative who has indicated his/her understanding and acceptance.   Dental advisory given  Plan Discussed with:   Anesthesia Plan Comments:        Anesthesia Quick Evaluation

## 2016-04-08 ENCOUNTER — Inpatient Hospital Stay (HOSPITAL_COMMUNITY): Payer: 59 | Admitting: Anesthesiology

## 2016-04-08 ENCOUNTER — Encounter (HOSPITAL_COMMUNITY): Payer: Self-pay | Admitting: *Deleted

## 2016-04-08 ENCOUNTER — Inpatient Hospital Stay (HOSPITAL_COMMUNITY)
Admission: RE | Disposition: A | Discharge status: Home / Self Care | Payer: Self-pay | Source: Ambulatory Visit | Attending: Neurosurgery

## 2016-04-08 ENCOUNTER — Inpatient Hospital Stay (HOSPITAL_COMMUNITY): Payer: 59

## 2016-04-08 ENCOUNTER — Inpatient Hospital Stay (HOSPITAL_COMMUNITY)
Admission: RE | Admit: 2016-04-08 | Discharge: 2016-04-09 | DRG: 460 | Disposition: A | Discharge status: Home / Self Care | Payer: 59 | Source: Ambulatory Visit | Attending: Neurosurgery | Admitting: Neurosurgery

## 2016-04-08 DIAGNOSIS — E2740 Unspecified adrenocortical insufficiency: Secondary | ICD-10-CM | POA: Diagnosis present

## 2016-04-08 DIAGNOSIS — Z87891 Personal history of nicotine dependence: Secondary | ICD-10-CM

## 2016-04-08 DIAGNOSIS — M48061 Spinal stenosis, lumbar region without neurogenic claudication: Secondary | ICD-10-CM | POA: Diagnosis present

## 2016-04-08 DIAGNOSIS — M0600 Rheumatoid arthritis without rheumatoid factor, unspecified site: Secondary | ICD-10-CM

## 2016-04-08 DIAGNOSIS — M5136 Other intervertebral disc degeneration, lumbar region: Secondary | ICD-10-CM | POA: Diagnosis not present

## 2016-04-08 DIAGNOSIS — M5126 Other intervertebral disc displacement, lumbar region: Secondary | ICD-10-CM | POA: Diagnosis not present

## 2016-04-08 DIAGNOSIS — Z419 Encounter for procedure for purposes other than remedying health state, unspecified: Secondary | ICD-10-CM

## 2016-04-08 DIAGNOSIS — M545 Low back pain: Secondary | ICD-10-CM | POA: Diagnosis present

## 2016-04-08 DIAGNOSIS — M48062 Spinal stenosis, lumbar region with neurogenic claudication: Principal | ICD-10-CM | POA: Diagnosis present

## 2016-04-08 DIAGNOSIS — M4317 Spondylolisthesis, lumbosacral region: Secondary | ICD-10-CM | POA: Diagnosis present

## 2016-04-08 DIAGNOSIS — M810 Age-related osteoporosis without current pathological fracture: Secondary | ICD-10-CM | POA: Diagnosis present

## 2016-04-08 DIAGNOSIS — Z981 Arthrodesis status: Secondary | ICD-10-CM | POA: Diagnosis not present

## 2016-04-08 SURGERY — POSTERIOR LUMBAR FUSION 1 LEVEL
Anesthesia: General | Site: Back

## 2016-04-08 MED ORDER — FENTANYL CITRATE (PF) 250 MCG/5ML IJ SOLN
INTRAMUSCULAR | Status: AC
  Filled 2016-04-08: qty 5

## 2016-04-08 MED ORDER — 0.9 % SODIUM CHLORIDE (POUR BTL) OPTIME
TOPICAL | Status: DC | PRN
  Administered 2016-04-08: 1000 mL

## 2016-04-08 MED ORDER — FENTANYL CITRATE (PF) 100 MCG/2ML IJ SOLN
INTRAMUSCULAR | Status: DC | PRN
  Administered 2016-04-08: 50 ug via INTRAVENOUS
  Administered 2016-04-08: 25 ug via INTRAVENOUS
  Administered 2016-04-08: 100 ug via INTRAVENOUS
  Administered 2016-04-08 (×3): 50 ug via INTRAVENOUS
  Administered 2016-04-08: 25 ug via INTRAVENOUS
  Administered 2016-04-08 (×3): 50 ug via INTRAVENOUS

## 2016-04-08 MED ORDER — BUSPIRONE HCL 5 MG PO TABS
5.0000 mg | ORAL_TABLET | Freq: Two times a day (BID) | ORAL | Status: DC
  Administered 2016-04-08 – 2016-04-09 (×3): 5 mg via ORAL
  Filled 2016-04-08 (×3): qty 1

## 2016-04-08 MED ORDER — SODIUM CHLORIDE 0.9 % IR SOLN
Status: DC | PRN
  Administered 2016-04-08: 500 mL

## 2016-04-08 MED ORDER — THROMBIN 20000 UNITS EX SOLR
CUTANEOUS | Status: AC
  Filled 2016-04-08: qty 20000

## 2016-04-08 MED ORDER — LIDOCAINE-EPINEPHRINE (PF) 2 %-1:200000 IJ SOLN
INTRAMUSCULAR | Status: DC | PRN
  Administered 2016-04-08: 15 mL

## 2016-04-08 MED ORDER — ROCURONIUM BROMIDE 100 MG/10ML IV SOLN
INTRAVENOUS | Status: DC | PRN
Start: 2016-04-08 — End: 2016-04-08
  Administered 2016-04-08: 20 mg via INTRAVENOUS
  Administered 2016-04-08: 30 mg via INTRAVENOUS

## 2016-04-08 MED ORDER — PROMETHAZINE HCL 25 MG/ML IJ SOLN: 6.2500 mg | INTRAMUSCULAR | Status: DC | PRN

## 2016-04-08 MED ORDER — BUTALBITAL-APAP-CAFFEINE 50-325-40 MG PO TABS: 1.0000 | ORAL_TABLET | Freq: Two times a day (BID) | ORAL | Status: DC | PRN

## 2016-04-08 MED ORDER — BISACODYL 10 MG RE SUPP: 10.0000 mg | Freq: Every day | RECTAL | Status: DC | PRN

## 2016-04-08 MED ORDER — HYDROMORPHONE HCL 1 MG/ML IJ SOLN
INTRAMUSCULAR | Status: AC
  Filled 2016-04-08: qty 0.5

## 2016-04-08 MED ORDER — SODIUM CHLORIDE 0.9% FLUSH: 3.0000 mL | INTRAVENOUS | Status: DC | PRN

## 2016-04-08 MED ORDER — HYDROMORPHONE HCL 1 MG/ML IJ SOLN
0.2500 mg | INTRAMUSCULAR | Status: DC | PRN
  Administered 2016-04-08: 0.5 mg via INTRAVENOUS

## 2016-04-08 MED ORDER — PROPOFOL 10 MG/ML IV BOLUS
INTRAVENOUS | Status: DC | PRN
  Administered 2016-04-08: 40 mg via INTRAVENOUS
  Administered 2016-04-08: 90 mg via INTRAVENOUS

## 2016-04-08 MED ORDER — CLONAZEPAM 0.5 MG PO TABS: 0.5000 mg | ORAL_TABLET | Freq: Every day | ORAL | Status: DC | PRN

## 2016-04-08 MED ORDER — KETOROLAC TROMETHAMINE 15 MG/ML IJ SOLN
15.0000 mg | Freq: Once | INTRAMUSCULAR | Status: AC
  Administered 2016-04-08: 15 mg via INTRAVENOUS

## 2016-04-08 MED ORDER — SODIUM CHLORIDE 0.9% FLUSH
3.0000 mL | Freq: Two times a day (BID) | INTRAVENOUS | Status: DC
  Administered 2016-04-08 – 2016-04-09 (×2): 3 mL via INTRAVENOUS

## 2016-04-08 MED ORDER — ONDANSETRON HCL 4 MG/2ML IJ SOLN
INTRAMUSCULAR | Status: DC | PRN
Start: 2016-04-08 — End: 2016-04-08
  Administered 2016-04-08: 4 mg via INTRAVENOUS

## 2016-04-08 MED ORDER — ONDANSETRON HCL 4 MG PO TABS: 4.0000 mg | ORAL_TABLET | Freq: Four times a day (QID) | ORAL | Status: DC | PRN

## 2016-04-08 MED ORDER — LIDOCAINE HCL (CARDIAC) 20 MG/ML IV SOLN
INTRAVENOUS | Status: DC | PRN
  Administered 2016-04-08: 60 mg via INTRAVENOUS

## 2016-04-08 MED ORDER — CEFAZOLIN SODIUM-DEXTROSE 2-4 GM/100ML-% IV SOLN
2.0000 g | INTRAVENOUS | Status: AC
  Administered 2016-04-08: 2 g via INTRAVENOUS
  Filled 2016-04-08: qty 100

## 2016-04-08 MED ORDER — BUPIVACAINE HCL (PF) 0.5 % IJ SOLN
INTRAMUSCULAR | Status: AC
Start: 2016-04-08 — End: 2016-04-08
  Filled 2016-04-08: qty 30

## 2016-04-08 MED ORDER — CHLORHEXIDINE GLUCONATE CLOTH 2 % EX PADS: 6.0000 | MEDICATED_PAD | Freq: Once | CUTANEOUS | Status: DC

## 2016-04-08 MED ORDER — PHENYLEPHRINE HCL 10 MG/ML IJ SOLN
INTRAMUSCULAR | Status: DC | PRN
  Administered 2016-04-08: 40 ug via INTRAVENOUS
  Administered 2016-04-08: 80 ug via INTRAVENOUS
  Administered 2016-04-08 (×2): 40 ug via INTRAVENOUS
  Administered 2016-04-08: 80 ug via INTRAVENOUS
  Administered 2016-04-08 (×2): 40 ug via INTRAVENOUS

## 2016-04-08 MED ORDER — HYDROXYZINE HCL 50 MG/ML IM SOLN
50.0000 mg | INTRAMUSCULAR | Status: DC | PRN
  Administered 2016-04-08: 50 mg via INTRAMUSCULAR
  Filled 2016-04-08: qty 1

## 2016-04-08 MED ORDER — LIDOCAINE-EPINEPHRINE (PF) 2 %-1:200000 IJ SOLN
INTRAMUSCULAR | Status: AC
  Filled 2016-04-08: qty 20

## 2016-04-08 MED ORDER — DEXAMETHASONE SODIUM PHOSPHATE 10 MG/ML IJ SOLN
INTRAMUSCULAR | Status: DC | PRN
  Administered 2016-04-08: 10 mg via INTRAVENOUS

## 2016-04-08 MED ORDER — PHENOL 1.4 % MT LIQD: 1.0000 | OROMUCOSAL | Status: DC | PRN

## 2016-04-08 MED ORDER — PROPOFOL 10 MG/ML IV BOLUS
INTRAVENOUS | Status: AC
  Filled 2016-04-08: qty 20

## 2016-04-08 MED ORDER — HYDROCODONE-ACETAMINOPHEN 5-325 MG PO TABS
1.0000 | ORAL_TABLET | ORAL | Status: DC | PRN
  Administered 2016-04-08 – 2016-04-09 (×5): 2 via ORAL
  Filled 2016-04-08 (×5): qty 2

## 2016-04-08 MED ORDER — MIDAZOLAM HCL 2 MG/2ML IJ SOLN
INTRAMUSCULAR | Status: AC
  Filled 2016-04-08: qty 2

## 2016-04-08 MED ORDER — ACETAMINOPHEN 10 MG/ML IV SOLN
INTRAVENOUS | Status: AC
  Filled 2016-04-08: qty 100

## 2016-04-08 MED ORDER — MIDAZOLAM HCL 5 MG/5ML IJ SOLN
INTRAMUSCULAR | Status: DC | PRN
  Administered 2016-04-08: 2 mg via INTRAVENOUS

## 2016-04-08 MED ORDER — ACETAMINOPHEN 10 MG/ML IV SOLN
INTRAVENOUS | Status: DC | PRN
  Administered 2016-04-08: 1000 mg via INTRAVENOUS

## 2016-04-08 MED ORDER — PREDNISONE 5 MG PO TABS
5.0000 mg | ORAL_TABLET | Freq: Every evening | ORAL | Status: DC
  Administered 2016-04-08: 5 mg via ORAL
  Filled 2016-04-08: qty 1

## 2016-04-08 MED ORDER — KCL IN DEXTROSE-NACL 20-5-0.45 MEQ/L-%-% IV SOLN
INTRAVENOUS | Status: DC
  Administered 2016-04-08: 13:00:00 via INTRAVENOUS
  Filled 2016-04-08: qty 1000

## 2016-04-08 MED ORDER — BUPIVACAINE HCL (PF) 0.5 % IJ SOLN
INTRAMUSCULAR | Status: DC | PRN
  Administered 2016-04-08: 15 mL

## 2016-04-08 MED ORDER — MENTHOL 3 MG MT LOZG: 1.0000 | LOZENGE | OROMUCOSAL | Status: DC | PRN

## 2016-04-08 MED ORDER — ACETAMINOPHEN 325 MG PO TABS: 650.0000 mg | ORAL_TABLET | ORAL | Status: DC | PRN

## 2016-04-08 MED ORDER — THROMBIN 5000 UNITS EX SOLR
OROMUCOSAL | Status: DC | PRN
  Administered 2016-04-08: 5 mL via TOPICAL

## 2016-04-08 MED ORDER — HYDROXYZINE HCL 25 MG PO TABS: 50.0000 mg | ORAL_TABLET | ORAL | Status: DC | PRN

## 2016-04-08 MED ORDER — LACTATED RINGERS IV SOLN
INTRAVENOUS | Status: DC | PRN
  Administered 2016-04-08 (×2): via INTRAVENOUS

## 2016-04-08 MED ORDER — THROMBIN 5000 UNITS EX SOLR
CUTANEOUS | Status: AC
  Filled 2016-04-08: qty 5000

## 2016-04-08 MED ORDER — MAGNESIUM HYDROXIDE 400 MG/5ML PO SUSP: 30.0000 mL | Freq: Every day | ORAL | Status: DC | PRN

## 2016-04-08 MED ORDER — KETOROLAC TROMETHAMINE 15 MG/ML IJ SOLN
INTRAMUSCULAR | Status: AC
Start: 2016-04-08 — End: 2016-04-08
  Filled 2016-04-08: qty 1

## 2016-04-08 MED ORDER — MORPHINE SULFATE (PF) 4 MG/ML IV SOLN: 4.0000 mg | INTRAVENOUS | Status: DC | PRN

## 2016-04-08 MED ORDER — ACETAMINOPHEN 650 MG RE SUPP: 650.0000 mg | RECTAL | Status: DC | PRN

## 2016-04-08 MED ORDER — KETOROLAC TROMETHAMINE 15 MG/ML IJ SOLN
15.0000 mg | Freq: Four times a day (QID) | INTRAMUSCULAR | Status: DC
Start: 2016-04-08 — End: 2016-04-09
  Administered 2016-04-08 – 2016-04-09 (×4): 15 mg via INTRAVENOUS
  Filled 2016-04-08 (×4): qty 1

## 2016-04-08 MED ORDER — ALUM & MAG HYDROXIDE-SIMETH 200-200-20 MG/5ML PO SUSP: 30.0000 mL | Freq: Four times a day (QID) | ORAL | Status: DC | PRN

## 2016-04-08 MED ORDER — EPHEDRINE SULFATE 50 MG/ML IJ SOLN
INTRAMUSCULAR | Status: DC | PRN
  Administered 2016-04-08: 5 mg via INTRAVENOUS

## 2016-04-08 MED ORDER — SODIUM CHLORIDE 0.9 % IV SOLN: 250.0000 mL | INTRAVENOUS | Status: DC

## 2016-04-08 MED ORDER — KCL IN DEXTROSE-NACL 20-5-0.45 MEQ/L-%-% IV SOLN
INTRAVENOUS | Status: AC
  Filled 2016-04-08: qty 1000

## 2016-04-08 MED ORDER — CYCLOBENZAPRINE HCL 10 MG PO TABS
5.0000 mg | ORAL_TABLET | Freq: Three times a day (TID) | ORAL | Status: DC | PRN
Start: 2016-04-08 — End: 2016-04-09
  Administered 2016-04-08: 10 mg via ORAL
  Filled 2016-04-08: qty 1

## 2016-04-08 MED ORDER — FLEET ENEMA 7-19 GM/118ML RE ENEM: 1.0000 | ENEMA | Freq: Once | RECTAL | Status: DC | PRN

## 2016-04-08 MED ORDER — ONDANSETRON HCL 4 MG/2ML IJ SOLN: 4.0000 mg | Freq: Four times a day (QID) | INTRAMUSCULAR | Status: DC | PRN

## 2016-04-08 MED ORDER — SURGIFOAM 100 EX MISC
CUTANEOUS | Status: DC | PRN
  Administered 2016-04-08: 20 mL via TOPICAL

## 2016-04-08 MED FILL — Heparin Sodium (Porcine) Inj 1000 Unit/ML: INTRAMUSCULAR | Qty: 30 | Status: AC

## 2016-04-08 MED FILL — Sodium Chloride IV Soln 0.9%: INTRAVENOUS | Qty: 1000 | Status: AC

## 2016-04-08 SURGICAL SUPPLY — 70 items
BAG DECANTER FOR FLEXI CONT (MISCELLANEOUS) ×2 IMPLANT
BLADE CLIPPER SURG (BLADE) IMPLANT
BUR ACRON 5.0MM COATED (BURR) ×2 IMPLANT
BUR MATCHSTICK NEURO 3.0 LAGG (BURR) ×2 IMPLANT
CANISTER SUCT 3000ML PPV (MISCELLANEOUS) ×2 IMPLANT
CAP LCK SPNE (Orthopedic Implant) ×4 IMPLANT
CAP LOCK SPINE RADIUS (Orthopedic Implant) ×4 IMPLANT
CAP LOCKING (Orthopedic Implant) ×4 IMPLANT
CARTRIDGE OIL MAESTRO DRILL (MISCELLANEOUS) ×1 IMPLANT
CONT SPEC 4OZ CLIKSEAL STRL BL (MISCELLANEOUS) ×2 IMPLANT
COVER BACK TABLE 60X90IN (DRAPES) ×2 IMPLANT
DERMABOND ADVANCED (GAUZE/BANDAGES/DRESSINGS) ×3
DERMABOND ADVANCED .7 DNX12 (GAUZE/BANDAGES/DRESSINGS) ×3 IMPLANT
DIFFUSER DRILL AIR PNEUMATIC (MISCELLANEOUS) ×2 IMPLANT
DRAPE C-ARM 42X72 X-RAY (DRAPES) ×4 IMPLANT
DRAPE HALF SHEET 40X57 (DRAPES) IMPLANT
DRAPE LAPAROTOMY 100X72X124 (DRAPES) ×2 IMPLANT
DRAPE POUCH INSTRU U-SHP 10X18 (DRAPES) ×2 IMPLANT
ELECT REM PT RETURN 9FT ADLT (ELECTROSURGICAL) ×2
ELECTRODE REM PT RTRN 9FT ADLT (ELECTROSURGICAL) ×1 IMPLANT
GAUZE SPONGE 4X4 12PLY STRL (GAUZE/BANDAGES/DRESSINGS) ×2 IMPLANT
GAUZE SPONGE 4X4 12PLY STRL LF (GAUZE/BANDAGES/DRESSINGS) ×2 IMPLANT
GAUZE SPONGE 4X4 16PLY XRAY LF (GAUZE/BANDAGES/DRESSINGS) ×2 IMPLANT
GLOVE BIOGEL PI IND STRL 7.5 (GLOVE) ×2 IMPLANT
GLOVE BIOGEL PI IND STRL 8 (GLOVE) ×3 IMPLANT
GLOVE BIOGEL PI INDICATOR 7.5 (GLOVE) ×2
GLOVE BIOGEL PI INDICATOR 8 (GLOVE) ×3
GLOVE ECLIPSE 6.5 STRL STRAW (GLOVE) ×2 IMPLANT
GLOVE ECLIPSE 7.0 STRL STRAW (GLOVE) ×4 IMPLANT
GLOVE ECLIPSE 7.5 STRL STRAW (GLOVE) ×6 IMPLANT
GOWN STRL REUS W/ TWL LRG LVL3 (GOWN DISPOSABLE) ×1 IMPLANT
GOWN STRL REUS W/ TWL XL LVL3 (GOWN DISPOSABLE) ×2 IMPLANT
GOWN STRL REUS W/TWL 2XL LVL3 (GOWN DISPOSABLE) ×4 IMPLANT
GOWN STRL REUS W/TWL LRG LVL3 (GOWN DISPOSABLE) ×1
GOWN STRL REUS W/TWL XL LVL3 (GOWN DISPOSABLE) ×2
HEMOSTAT POWDER KIT SURGIFOAM (HEMOSTASIS) ×2 IMPLANT
KIT BASIN OR (CUSTOM PROCEDURE TRAY) ×2 IMPLANT
KIT INFUSE X SMALL 1.4CC (Orthopedic Implant) ×2 IMPLANT
KIT ROOM TURNOVER OR (KITS) ×2 IMPLANT
NEEDLE ASP BONE MRW 8GX15 (NEEDLE) ×2 IMPLANT
NEEDLE SPNL 18GX3.5 QUINCKE PK (NEEDLE) ×2 IMPLANT
NEEDLE SPNL 22GX3.5 QUINCKE BK (NEEDLE) ×4 IMPLANT
NS IRRIG 1000ML POUR BTL (IV SOLUTION) ×2 IMPLANT
OIL CARTRIDGE MAESTRO DRILL (MISCELLANEOUS) ×2
PACK LAMINECTOMY NEURO (CUSTOM PROCEDURE TRAY) ×2 IMPLANT
PAD ARMBOARD 7.5X6 YLW CONV (MISCELLANEOUS) ×6 IMPLANT
PATTIES SURGICAL .5 X.5 (GAUZE/BANDAGES/DRESSINGS) IMPLANT
PATTIES SURGICAL .5 X1 (DISPOSABLE) IMPLANT
PATTIES SURGICAL 1X1 (DISPOSABLE) IMPLANT
PEEK PLIF AVS 10X20X4 (Peek) ×4 IMPLANT
ROD RADIUS 35MM (Rod) ×4 IMPLANT
SCREW 5.75 X 635 (Screw) ×4 IMPLANT
SCREW 5.75X30MM (Screw) ×2 IMPLANT
SCREW 6.75X30MM (Screw) ×2 IMPLANT
SPONGE LAP 4X18 X RAY DECT (DISPOSABLE) IMPLANT
SPONGE NEURO XRAY DETECT 1X3 (DISPOSABLE) IMPLANT
SPONGE SURGIFOAM ABS GEL 100 (HEMOSTASIS) ×2 IMPLANT
STAPLER SKIN PROX WIDE 3.9 (STAPLE) ×2 IMPLANT
STRIP BIOACTIVE VITOSS 25X100X (Neuro Prosthesis/Implant) ×4 IMPLANT
SUT VIC AB 1 CT1 18XBRD ANBCTR (SUTURE) ×1 IMPLANT
SUT VIC AB 1 CT1 8-18 (SUTURE) ×1
SUT VIC AB 2-0 CP2 18 (SUTURE) ×4 IMPLANT
SUT VIC AB 3-0 SH 8-18 (SUTURE) ×2 IMPLANT
SYR 3ML LL SCALE MARK (SYRINGE) IMPLANT
SYR CONTROL 10ML LL (SYRINGE) ×4 IMPLANT
TAPE CLOTH SURG 4X10 WHT LF (GAUZE/BANDAGES/DRESSINGS) ×2 IMPLANT
TOWEL GREEN STERILE (TOWEL DISPOSABLE) ×2 IMPLANT
TOWEL GREEN STERILE FF (TOWEL DISPOSABLE) ×2 IMPLANT
TRAY FOLEY W/METER SILVER 16FR (SET/KITS/TRAYS/PACK) ×2 IMPLANT
WATER STERILE IRR 1000ML POUR (IV SOLUTION) ×2 IMPLANT

## 2016-04-08 NOTE — Op Note (Signed)
04/08/2016  11:46 AM  PATIENT:  Anne Garrett  41 y.o. female  PRE-OPERATIVE DIAGNOSIS:  L5-S1 lumbar stenosis with neurogenic claudication, grade 2-3 dynamic degenerative L5-S1 spondylolisthesis, lumbar HNP, lumbar spondylosis, lumbar degenerative disease  POST-OPERATIVE DIAGNOSIS:  L5-S1 lumbar stenosis with neurogenic claudication, grade 2-3 dynamic degenerative L5-S1 spondylolisthesis, lumbar HNP, lumbar spondylosis, lumbar degenerative disease  PROCEDURE:  Procedure(s):  Bilateral L5-S1 lumbar decompression including laminectomy, facetectomy, and foraminotomies, for decompression of the central canal stenosis and decompression of the neural foraminal stenosis, with decompression of the exiting L5 and S1 nerve roots bilaterally, with decompression beyond that required for interbody arthrodesis; bilateral L5-S1 posterior lumbar interbody arthrodesis with AVS peek interbody implants, Vitoss BA with bone marrow aspirate, and infuse; bilateral L5-S1 posterior lateral arthrodesis with nonsegmental radius posterior instrumentation, Vitoss BA with bone marrow aspirate, and infuse  SURGEON:  Surgeon(s): Jovita Gamma, MD Ashok Pall, MD  ASSISTANTS: Ashok Pall, M.D.  ANESTHESIA:   general  EBL:  Total I/O In: 1000 [I.V.:1000] Out: 520 [Urine:320; Blood:200]  BLOOD ADMINISTERED:none  CELL SAVER GIVEN: Cell Saver technician felt that there was insufficient blood loss to process to collect the blood  COUNT: Correct per nursing staff  DICTATION: Patient is brought to the operating room placed under general endotracheal anesthesia. The patient was turned to prone position the lumbar region was prepped with Betadine soap and solution and draped in a sterile fashion. The midline was infiltrated with local anesthesia with epinephrine. A localizing x-ray was taken and then a midline incision was made carried down through the subcutaneous tissue, bipolar cautery and electrocautery were used to  maintain hemostasis. Dissection was carried down to the lumbar fascia. The fascia was incised bilaterally and the paraspinal muscles were dissected with a spinous process and lamina in a subperiosteal fashion. Another x-ray was taken for localization and the L5-S1 level was localized. Dissection was then carried out laterally over the facet complex and the transverse processes of L5 and the ala of S1 were exposed and decorticated.  We then proceeded with the decompression. Bilateral L5 and S1 laminotomimies were performed using the high-speed drill and Kerrison punches. Dissection was carried out laterally including facetectomy and foraminotomies with decompression of the stenotic compression of the L5 and S1 nerve roots. Bilateral discectomy was performed by incision of the annulus bilaterally, and a thorough discectomy was performed using a variety of pituitary rongeurs and curettes. The large disc herniation which protruded rostrally behind the inferior aspect of the body of L5 was removed, and good decompression of the thecal sac and exiting nerve roots was achieved. We then proceeded with the posterior lumbar interbody arthrodesis. The vertebral body endplates surfaces were prepared, removing the cartilaginous endplate surfaces.  We then measured the height of the intervertebral disc space. We selected 10 x 20 x 4 AVS peek interbody implants.  The C-arm fluoroscope was then draped and brought in the field and we identified the pedicle entry points bilaterally at the L5 and S1 levels. Each of the 4 pedicles was probed, we aspirated bone marrow aspirate from the vertebral bodies, this was injected over two 10 cc strips of Vitoss BA. Then each of the pedicles was examined with the ball probe good bony surfaces were found and no bony cuts were found. Each of the pedicles was then tapped with a 5.25 mm tap, again examined with the ball probe good threading was found and no bony cuts were found. We then placed  5.75 x 35 mm screws bilaterally at  L5, and a 6.75 x 30 mm screw on the left at S1 and a 5.75 x 30 mm screw on the right at S1.  We then packed the AVS peek interbody implants with Vitoss BA with bone marrow aspirate and infuse, and then placed the first implant and on the right side, carefully retracting the thecal sac and nerve root medially. We then went back to the left side and packed the midline with additional Vitoss BA with bone marrow aspirate and infuse, and then placed a second implant and on the left side again retracting the thecal sac and nerve root medially. Additional Vitoss BA with bone marrow aspirate was packed lateral to the implants.  We then packed the lateral gutter over the transverse processes and intertransverse space with Vitoss BA with bone marrow aspirate and infuse. We then selected a 35 mm pre-lordosed rods, they were placed within the screw heads and secured with locking caps once all 4 locking caps were placed final tightening was performed against a counter torque.  The wound had been irrigated multiple times during the procedure with saline solution and bacitracin solution, good hemostasis was established with a combination of bipolar cautery and Gelfoam with thrombin. Once good hemostasis was confirmed we proceeded with closure paraspinal muscles deep fascia and Scarpa's fascia were closed with interrupted undyed 1 Vicryl sutures the subcutaneous and subcuticular closed with interrupted inverted 2-0 undyed Vicryl sutures the skin edges were approximated with Dermabond.  The wound was dressed with sterile gauze and Hypafix.  Following surgery the patient was turned back to the supine position to be reversed and the anesthetic extubated and transferred to the recovery room for further care.  PLAN OF CARE: Admit for overnight observation  PATIENT DISPOSITION:  PACU - hemodynamically stable.   Delay start of Pharmacological VTE agent (>24hrs) due to surgical blood loss or  risk of bleeding:  yes

## 2016-04-08 NOTE — H&P (Signed)
Subjective: Patient is a 41 y.o. right handed white female who is admitted for treatment of grade 2-3 dynamic degenerative spondylolisthesis of L5 on S1 secondary to advanced degenerative facet arthropathy and associated with broad-based disc herniation and significant canal as well as bilateral neural foraminal stenosis.  Patient's history is notable for juvenile rheumatoid arthritis and she is undergone numerous orthopedic sutures. Last year she presented with cervical myelopathy and underwent successful anterior and posterior cervical decompression and arthrodesis. However over the past 5 months she's developed burning pain in the posterior thighs bilaterally with pain at the lumbosacral junction. Exam shows intact strength, but imaging reveals significant listhesis with associated degenerative changes and stenosis with neural compression. The patient is admitted now for an L5-S1 lumbar decompression including laminectomy, facetectomy, and foraminotomies, with bilateral L5-S1 posterior lumbar interbody arthrodesis with interbody implants and bone graft, and bilateral L5-S1 posterior lateral arthrodesis with posterior instrumentation and bone graft.    Patient Active Problem List   Diagnosis Date Noted  . S/P cervical spinal fusion 07/13/2015  . Cervical stenosis of spinal canal 07/12/2015  . HNP (herniated nucleus pulposus), cervical 06/19/2015  . Numbness 05/20/2015  . Chronic rheumatic arthritis ([place]) 05/20/2015  . Carpal tunnel syndrome 05/20/2015  . Snoring 05/20/2015  . Excessive daytime sleepiness 05/20/2015   Past Medical History:  Diagnosis Date  . Adrenal insufficiency ([place])    secondary to Prednisone  . Collagen vascular disease ([place])   . Eczema    coner oif eye lid  . Headache    Migraine  . Heart murmur    only hear once slight  . Intervertebral disk disease   . Osteoporosis   . Rheumatoid arthritis ([place])    Juvenile onset    Past Surgical History:  Procedure Laterality  Date  . ABDOMINAL SURGERY    . ANKLE ARTHROPLASTY Right   . ANTERIOR CERVICAL DECOMP/DISCECTOMY FUSION N/A 06/19/2015   Procedure: C3-4 C4-5 Anterior cervical decompression/diskectomy/fusion;  Surgeon: Jovita Gamma, MD;  Location: [place] [facility];  Service: Neurosurgery;  Laterality: N/A;  C3-4 C4-5 Anterior cervical decompression/diskectomy/fusion  . APPENDECTOMY    . CARPAL TUNNEL RELEASE Right   . CESAREAN SECTION    . CHOLECYSTECTOMY    . COLONOSCOPY    . ESSURE TUBAL LIGATION     Essure Implant-Permanent birth control  . excision to palm Right    excision of mass to right plam  . JOINT REPLACEMENT Bilateral    bilat hip replacements  . ORIF FEMUR FRACTURE Right   . POSTERIOR CERVICAL FUSION/FORAMINOTOMY N/A 07/12/2015   Procedure: C2 to C5 Cervical laminectomy, C2 to C5 posterior cervical arthrodesis with instrumentation and bone graft;  Surgeon: Jovita Gamma, MD;  Location: [facility] [facility];  Service: Neurosurgery;  Laterality: N/A;  C2 to C5 Cervical laminectomy, C2  to C5 posterior cervical arthrodesis with instrumentation and bone graft  . right foot surgery Right    right jfoot arthrodesis and right ankle arthroplasty  . WRIST SURGERY Bilateral    arthrodesis    Prescriptions Prior to Admission  Medication Sig Dispense Refill Last Dose  . Ascorbic Acid (VITAMIN C PO) Take 1 tablet by mouth daily.   > 1 wk ago  . busPIRone (BUSPAR) 5 MG tablet Take 5 mg by mouth 2 (two) times daily.   04/07/2016 at Unknown time  . butalbital-acetaminophen-caffeine (FIORICET, ESGIC) 50-325-40 MG tablet Take 1 tablet by mouth 2 (two) times daily as needed for headache.    04/07/2016 at Unknown time  .  CALCIUM-VITAMIN D PO Take 1 tablet by mouth daily.   > 1 wk ago  . ibandronate (BONIVA) 150 MG tablet Take 150 mg by mouth every 30 (thirty) days. Take in the morning with a full glass of water, on an empty stomach, and do not take anything else by mouth or lie down for the next 30 min.   end of Mar  .  ibuprofen (ADVIL,MOTRIN) 200 MG tablet Take 600 mg by mouth every 6 (six) hours as needed for mild pain.   04/07/2016 at Unknown time  . Multiple Vitamin (MULTIVITAMIN) tablet Take 1 tablet by mouth daily.   > 1 wk ago  . predniSONE (DELTASONE) 5 MG tablet Take 5 mg by mouth every evening.    04/07/2016 at Unknown time  . Tofacitinib Citrate (XELJANZ XR) 11 MG TB24 Take 1 tablet by mouth daily. (Patient taking differently: Take 11 mg by mouth daily. ) 30 tablet 3 > 1 wk ago  . Vitamin D, Ergocalciferol, (DRISDOL) 50000 units CAPS capsule Take 50,000 Units by mouth every 30 (thirty) days. Takes with Boniva   end of Mar  . clonazePAM (KLONOPIN) 1 MG tablet Take 0.5 mg by mouth daily as needed for anxiety.   Unknown at Unknown time  . docusate sodium (COLACE) 100 MG capsule Take 1 tablet once or twice daily as needed for constipation while taking narcotic pain medicine (Patient not taking: Reported on 03/25/2016) 30 capsule 0 Not Taking at Unknown time  . HYDROcodone-acetaminophen (NORCO/VICODIN) 5-325 MG tablet Take 1-2 tablets by mouth every 4 (four) hours as needed for moderate pain. (Patient not taking: Reported on 03/25/2016) 30 tablet 0 Not Taking at Unknown time  . triamcinolone ointment (KENALOG) 0.1 % Apply 1 application topically 2 (two) times daily as needed (eczema).   Unknown at Unknown time   Allergies  Allergen Reactions  . Estrogens Other (See Comments)    MIGRAINE WITH AURA  . Sulfa Antibiotics Itching and Rash    Head to toe  . Teriparatide Other (See Comments)    Severe Muscle Pain, Forteo  . Valproic Acid And Related Rash    Head to toe  . Leflunomide Diarrhea  . Methotrexate Diarrhea and Other (See Comments)    Fatigue  . Paroxetine Hcl Diarrhea    Social History  Substance Use Topics  . Smoking status: Former Smoker    Packs/day: 0.25    Years: 20.00    Types: Cigarettes    Quit date: 07/02/2015  . Smokeless tobacco: Never Used  . Alcohol use 1.2 oz/week    2 Glasses of  wine per week     Comment: social    Family History  Problem Relation Age of Onset  . Breast cancer Mother 1  . Nephritis Father   . Epilepsy Father   . Breast cancer Maternal Aunt     2 mat aunts   . Multiple sclerosis Maternal Uncle   . Psoriasis Paternal Aunt      Review of Systems A comprehensive review of systems was negative.  Objective: Vital signs in last 24 hours: Temp:  [98.9 F (37.2 C)] 98.9 F (37.2 C) (04/05 0600) Pulse Rate:  [73] 73 (04/05 0600) Resp:  [20] 20 (04/05 0600) BP: (106)/(71) 106/71 (04/05 0600) SpO2:  [98 %] 98 % (04/05 0600)  EXAM: Patient is a slight female in no acute distress. Lungs are clear to auscultation , the patient has symmetrical respiratory excursion. Heart has a regular rate and rhythm normal S1  and S2 no murmur.   Abdomen is soft nontender nondistended bowel sounds are present. Extremity examination shows no clubbing cyanosis or edema, but there are obvious deformities related to her juvenile rheumatoid arthritis as well as multiple previous surgeries. Motor examination shows 5 over 5 strength in the lower extremities including the iliopsoas quadriceps dorsiflexor extensor hallicus  longus and plantar flexor bilaterally. Sensation is intact to pinprick in the distal lower extremities. Reflex examination shows left quadriceps is 2-3, the right quadriceps is 2. Gastrocnemius reflex is absent by. Toes are downgoing body.    Data Review:CBC    Component Value Date/Time   WBC 6.3 03/31/2016 0842   RBC 3.85 (L) 03/31/2016 0842   HGB 12.7 03/31/2016 0842   HCT 39.2 03/31/2016 0842   PLT 331 03/31/2016 0842   MCV 101.8 (H) 03/31/2016 0842   MCH 33.0 03/31/2016 0842   MCHC 32.4 03/31/2016 0842   RDW 12.7 03/31/2016 0842   LYMPHSABS 2.9 05/15/2015 1013   MONOABS 0.7 05/15/2015 1013   EOSABS 0.1 05/15/2015 1013   BASOSABS 0.0 05/15/2015 1013                          BMET    Component Value Date/Time   NA 138 03/31/2016 0842   K  4.1 03/31/2016 0842   CL 107 03/31/2016 0842   CO2 22 03/31/2016 0842   GLUCOSE 78 03/31/2016 0842   BUN 8 03/31/2016 0842   CREATININE 0.46 03/31/2016 0842   CALCIUM 8.7 (L) 03/31/2016 0842   GFRNONAA >60 03/31/2016 0842   GFRAA >60 03/31/2016 2094     Assessment/Plan: Patient with increasing low back pain and neurogenic claudication with pain into the posterior thighs bilaterally who has a grade 2-3 dynamic degenerative spinal listhesis L5 and S1 with associated advanced degenerative changes as described above with significant canal and bilateral L5-S1 neural foraminal stenosis. She is admitted now for lumbar decompression and stabilization.  I've discussed with the patient the nature of his condition, the nature the surgical procedure, the typical length of surgery, hospital stay, and overall recuperation, the limitations postoperatively, and risks of surgery. I discussed risks including risks of infection, bleeding, possibly need for transfusion, the risk of nerve root dysfunction with pain, weakness, numbness, or paresthesias, the risk of dural tear and CSF leakage and possible need for further surgery, the risk of failure of the arthrodesis and possibly for further surgery, the risk of anesthetic complications including myocardial infarction, stroke, pneumonia, and death. We discussed the need for postoperative immobilization in a lumbar brace. Understanding all this the patient does wish to proceed with surgery and is admitted for such.     Hosie Spangle, MD 04/08/2016 6:50 AM

## 2016-04-08 NOTE — Anesthesia Postprocedure Evaluation (Signed)
Anesthesia Post Note  Patient: Anne Garrett  Procedure(s) Performed: Procedure(s) (LRB): LUMBAR FIVE-SACRUM ONE  DECOMPRESSION, POSTERIOR LUMBAR INTERBODY FUSION, POSTERIOR LATERAL ARTHRODESIS ONE LEVEL (N/A)  Patient location during evaluation: PACU Anesthesia Type: General Level of consciousness: awake and alert Pain management: pain level controlled Vital Signs Assessment: post-procedure vital signs reviewed and stable Respiratory status: spontaneous breathing, nonlabored ventilation, respiratory function stable and patient connected to nasal cannula oxygen Cardiovascular status: blood pressure returned to baseline and stable Postop Assessment: no signs of nausea or vomiting Anesthetic complications: no       Last Vitals:  Vitals:   04/08/16 1252 04/08/16 1302  BP: 98/74 98/60  Pulse: 72 60  Resp: 15 10  Temp:  36.6 C    Last Pain:  Vitals:   04/08/16 1530  TempSrc:   PainSc: 7                  Tiajuana Amass

## 2016-04-08 NOTE — Anesthesia Procedure Notes (Signed)
Procedure Name: Intubation Date/Time: 04/08/2016 7:40 AM Performed by: Shirlyn Goltz Pre-anesthesia Checklist: Patient identified, Emergency Drugs available, Suction available and Patient being monitored Patient Re-evaluated:Patient Re-evaluated prior to inductionOxygen Delivery Method: Circle system utilized Preoxygenation: Pre-oxygenation with 100% oxygen Intubation Type: IV induction Ventilation: Mask ventilation without difficulty Laryngoscope Size: 3 and Glidescope Grade View: Grade II Tube type: Oral Tube size: 7.0 mm Number of attempts: 1 Airway Equipment and Method: Stylet and Video-laryngoscopy Placement Confirmation: ETT inserted through vocal cords under direct vision,  positive ETCO2 and breath sounds checked- equal and bilateral Secured at: 20 cm Tube secured with: Tape Dental Injury: Teeth and Oropharynx as per pre-operative assessment

## 2016-04-08 NOTE — Transfer of Care (Signed)
Immediate Anesthesia Transfer of Care Note  Patient: Anne Garrett  Procedure(s) Performed: Procedure(s): LUMBAR FIVE-SACRUM ONE  DECOMPRESSION, POSTERIOR LUMBAR INTERBODY FUSION, POSTERIOR LATERAL ARTHRODESIS ONE LEVEL (N/A)  Patient Location: PACU  Anesthesia Type:General  Level of Consciousness: awake, alert , oriented and patient cooperative  Airway & Oxygen Therapy: Patient Spontanous Breathing and Patient connected to nasal cannula oxygen  Post-op Assessment: Report given to RN and Post -op Vital signs reviewed and stable  Post vital signs: Reviewed and stable  Last Vitals:  Vitals:   04/08/16 0600 04/08/16 1153  BP: 106/71 135/79  Pulse: 73 91  Resp: 20 15  Temp: 37.2 C     Last Pain:  Vitals:   04/08/16 0600  TempSrc: Oral  PainSc:          Complications: No apparent anesthesia complications

## 2016-04-09 MED ORDER — HYDROCODONE-ACETAMINOPHEN 5-325 MG PO TABS: 1.0000 | ORAL_TABLET | ORAL | 0 refills | Status: DC | PRN

## 2016-04-09 MED FILL — HYDROCODON-APAP 5-325: 5-325 | 5 days supply | Qty: 60 | Fill #0

## 2016-04-09 NOTE — Progress Notes (Signed)
D/c instructions reviewed with patient. Encouraged to adhere to home meds and follow up appointment.

## 2016-04-09 NOTE — Consult Note (Addendum)
   [facility] CM Inpatient Consult   04/09/2016  LORELLA GOMEZ 1975/11/02 [id number]    Spoke with Mrs. Deitrick, prior to discharge, on behalf of [place] [facility] [employer] for [employer] employees/dependents with [facility] insurance. She denies having any [employer] to [facility] needs at this time. Aware that she will receive post hospital discharge call. States she is ready to go home. Appreciative of call.    Marthenia Rolling, MSN-Ed, RN,BSN [facility] Liaison [phone]

## 2016-04-09 NOTE — Care Management Note (Signed)
Case Management Note  Patient Details  Name: ANGELISSE RISO MRN: [id number] Date of Birth: Apr 15, 1975  Subjective/Objective:               Patient was admitted, underwent an L5-S1 lumbar decompression and arthrodesis. Postoperatively she has done well. She is up and ambulating actively. Her dressing was removed, and her incision is healing nicely. Her Foley was removed, and she is voiding well. She is being discharged home with instructions regarding wound care and activities. She is scheduled to follow-up with MD in the office in 3 weeks. No CM consult, orders, or needs identified at this time. Patient with order to DC to home.       Action/Plan:  Anticipate DC to home today.  Expected Discharge Date:  04/09/16               Expected Discharge Plan:  Home/Self Care  In-House Referral:     Discharge planning Services  CM Consult  Post Acute Care Choice:    Choice offered to:     DME Arranged:    DME Agency:     HH Arranged:    HH Agency:     Status of Service:  Completed, signed off  If discussed at [employer] of Stay Meetings, dates discussed:    Additional Comments:  Carles Collet, RN 04/09/2016, 8:42 AM

## 2016-04-09 NOTE — Discharge Summary (Signed)
Physician Discharge Summary  Patient ID: Anne Garrett MRN: [id number] DOB/AGE: 1975/08/25 41 y.o.  Admit date: 04/08/2016 Discharge date: 04/09/2016  Admission Diagnoses:  L5-S1 lumbar stenosis with neurogenic claudication, grade 2-3 dynamic degenerative L5-S1 spondylolisthesis, lumbar HNP, lumbar spondylosis, lumbar degenerative disease  Discharge Diagnoses:  L5-S1 lumbar stenosis with neurogenic claudication, grade 2-3 dynamic degenerative L5-S1 spondylolisthesis, lumbar HNP, lumbar spondylosis, lumbar degenerative disease Active Problems:   Lumbar stenosis   Discharged Condition: good  Hospital Course: Patient was admitted, underwent an L5-S1 lumbar decompression and arthrodesis. Postoperatively she has done well. She is up and ambulating actively. Her dressing was removed, and her incision is healing nicely. Her Foley was removed, and she is voiding well. She is being discharged home with instructions regarding wound care and activities. She is scheduled to follow-up with me in the office in 3 weeks.  Discharge Exam: Blood pressure (!) 92/58, pulse 78, temperature 98.6 F (37 C), temperature source Oral, resp. rate 20, last menstrual period 03/22/2016, SpO2 97 %.  Disposition: 01-Home or Self Care  Discharge Instructions    Discharge wound care:    Complete by:  As directed    Leave the wound open to air. Shower daily with the wound uncovered. Water and soapy water should run over the incision area. Do not wash directly on the incision for 2 weeks. Remove the glue after 2 weeks.   Driving Restrictions    Complete by:  As directed    No driving for 2 weeks. May ride in the car locally now. May begin to drive locally in 2 weeks.   Other Restrictions    Complete by:  As directed    Walk gradually increasing distances out in the fresh air at least twice a day. Walking additional 6 times inside the house, gradually increasing distances, daily. No bending, lifting, or twisting. Perform  activities between shoulder and waist height (that is at counter height when standing or table height when sitting).     Allergies as of 04/09/2016      Reactions   Estrogens Other (See Comments)   MIGRAINE WITH AURA   Sulfa Antibiotics Itching, Rash   Head to toe   Teriparatide Other (See Comments)   Severe Muscle Pain, Forteo   Valproic Acid And Related Rash   Head to toe   Leflunomide Diarrhea   Methotrexate Diarrhea, Other (See Comments)   Fatigue   Paroxetine Hcl Diarrhea      Medication List    TAKE these medications   busPIRone 5 MG tablet Commonly known as:  BUSPAR Take 5 mg by mouth 2 (two) times daily.   butalbital-acetaminophen-caffeine 50-325-40 MG tablet Commonly known as:  FIORICET, ESGIC Take 1 tablet by mouth 2 (two) times daily as needed for headache.   CALCIUM-VITAMIN D PO Take 1 tablet by mouth daily.   clonazePAM 1 MG tablet Commonly known as:  KLONOPIN Take 0.5 mg by mouth daily as needed for anxiety.   docusate sodium 100 MG capsule Commonly known as:  COLACE Take 1 tablet once or twice daily as needed for constipation while taking narcotic pain medicine   HYDROcodone-acetaminophen 5-325 MG tablet Commonly known as:  NORCO/VICODIN Take 1-2 tablets by mouth every 4 (four) hours as needed for moderate pain. What changed:  Another medication with the same name was added. Make sure you understand how and when to take each.   HYDROcodone-acetaminophen 5-325 MG tablet Commonly known as:  NORCO/VICODIN Take 1-2 tablets by mouth every 4 (four)  hours as needed (pain). What changed:  You were already taking a medication with the same name, and this prescription was added. Make sure you understand how and when to take each.   ibandronate 150 MG tablet Commonly known as:  BONIVA Take 150 mg by mouth every 30 (thirty) days. Take in the morning with a full glass of water, on an empty stomach, and do not take anything else by mouth or lie down for the next 30  min.   ibuprofen 200 MG tablet Commonly known as:  ADVIL,MOTRIN Take 600 mg by mouth every 6 (six) hours as needed for mild pain.   multivitamin tablet Take 1 tablet by mouth daily.   predniSONE 5 MG tablet Commonly known as:  DELTASONE Take 5 mg by mouth every evening.   Tofacitinib Citrate 11 MG Tb24 Commonly known as:  XELJANZ XR Take 1 tablet by mouth daily. What changed:  how much to take   triamcinolone ointment 0.1 % Commonly known as:  KENALOG Apply 1 application topically 2 (two) times daily as needed (eczema).   VITAMIN C PO Take 1 tablet by mouth daily.   Vitamin D (Ergocalciferol) 50000 units Caps capsule Commonly known as:  DRISDOL Take 50,000 Units by mouth every 30 (thirty) days. Takes with Jaclyn Prime        SignedHosie Spangle 04/09/2016, 8:26 AM

## 2016-04-12 ENCOUNTER — Other Ambulatory Visit: Payer: Self-pay | Admitting: *Deleted

## 2016-04-12 NOTE — Patient Outreach (Addendum)
[place] [facility]) Care Management  04/12/2016  Anne Garrett [birth year] [id number]   Subjective: Telephone call to patient's home  / mobile number, no answer, left HIPAA compliant voicemail message, and requested call back.  Objective: Per chart review, patient hospitalized  04/08/16 - 04/09/16 for Lumbar stenosis.  Status post Bilateral L5-S1 lumbar decompression including laminectomy, facetectomy, and foraminotomies, for decompression of the central canal stenosis and decompression of the neural foraminal stenosis, with decompression of the exiting L5 and S1 nerve roots bilaterally, with decompression beyond that required for interbody arthrodesis; bilateral L5-S1 posterior lumbar interbody arthrodesis with AVS peek interbody implants, Vitoss BA with bone marrow aspirate, and infuse; bilateral L5-S1 posterior lateral arthrodesis with nonsegmental radius posterior instrumentation, Vitoss BA with bone marrow aspirate, and infuse on 04/08/16.   Patient also has a history of juvenile rheumatoid arthritis and migraines. [facility] Care Management preoperative call completed on 04/01/16.    Assessment: Received [facility]MR Transition of care referral on 04/09/16.  Transition of care follow up pending patient contact.   Plan: RNCM will call patient for 2nd telephone outreach attempt, transition of care follow up, within 10 business days if no return call.   Anne Garrett H. Annia Friendly, BSN, [place] Management [facility] Telephonic CM Phone: [phone] Fax: [phone]

## 2016-04-13 ENCOUNTER — Other Ambulatory Visit: Payer: Self-pay | Admitting: *Deleted

## 2016-04-13 ENCOUNTER — Encounter: Payer: Self-pay | Admitting: *Deleted

## 2016-04-13 NOTE — Patient Outreach (Signed)
[place] [facility]) Care Management  04/13/2016  TENAE GRAZIOSI 1975/01/15 [id number]  Subjective: Telephone call to patient's home number, spoke with patient, and HIPAA verified.  Discussed [facility] Care Management UMR Transition of care follow up, patient voices understanding, and is in agreement to complete follow up. Patient states she feels great, has a follow up appointment scheduled with surgeon on 04/18/16, primary MD is aware of surgery, and does not need to see her for hospital follow up appointment.    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  States she has family medical leave act [employer] ) in place and does not have the hospital indemnity supplemental insurance. Patient states she does not have any transition of care, care coordination, disease management, disease monitoring, transportation, community resource, or pharmacy needs at this time.   States she is very appreciative of the follow up and is in agreement to receive [place] Management information.   Objective: Per chart review, patient hospitalized  04/08/16 - 04/09/16 for Lumbar stenosis.  Status post Bilateral L5-S1 lumbar decompression including laminectomy, facetectomy, and foraminotomies, for decompression of the central canal stenosis and decompression of the neural foraminal stenosis, with decompression of the exiting L5 and S1 nerve roots bilaterally, with decompression beyond that required for interbody arthrodesis; bilateral L5-S1 posterior lumbar interbody arthrodesis with AVS peek interbody implants, Vitoss BA with bone marrow aspirate, and infuse; bilateral L5-S1 posterior lateral arthrodesis with nonsegmental radius posterior instrumentation, Vitoss BA with bone marrow aspirate, and infuse on 04/08/16.   Patient also has a history of juvenile rheumatoid arthritis and migraines. [facility] Care Management preoperative call completed on  04/01/16.    Assessment: Received UMR Transition of care referral on 04/09/16.  Transition of care follow up completed, no care management needs, and will proceed with case closure.   Plan: RNCM will send patient successful outreach letter, [facility] pamphlet, and magnet. RNCM will send case closure due to follow up completed / no care management needs request to [facility] [place] Management.   Kennetta Pavlovic H. Annia Friendly, BSN, [place] Management [facility] Telephonic CM Phone: [phone] Fax: [phone]

## 2016-04-15 MED FILL — METHOCARBAMOL 500 MG TABLET: 500 | 30 days supply | Qty: 90 | Fill #0

## 2016-04-15 MED FILL — HYDROCODON-APAP 5-325: 5-325 | 5 days supply | Qty: 60 | Fill #0

## 2016-04-23 MED FILL — BUTALBITAL/APAP/CAFFEINE TB: 50-325-40 | 5 days supply | Qty: 30 | Fill #2

## 2016-04-27 ENCOUNTER — Other Ambulatory Visit: Payer: Self-pay | Admitting: Neurosurgery

## 2016-04-27 ENCOUNTER — Ambulatory Visit
Admission: RE | Admit: 2016-04-27 | Discharge: 2016-04-27 | Disposition: A | Discharge status: Home / Self Care | Payer: 59 | Source: Ambulatory Visit | Attending: Neurosurgery | Admitting: Neurosurgery

## 2016-04-27 DIAGNOSIS — M4316 Spondylolisthesis, lumbar region: Secondary | ICD-10-CM | POA: Diagnosis not present

## 2016-04-27 DIAGNOSIS — M4326 Fusion of spine, lumbar region: Secondary | ICD-10-CM

## 2016-04-27 DIAGNOSIS — Z981 Arthrodesis status: Secondary | ICD-10-CM | POA: Diagnosis not present

## 2016-04-27 DIAGNOSIS — M48062 Spinal stenosis, lumbar region with neurogenic claudication: Secondary | ICD-10-CM | POA: Diagnosis not present

## 2016-04-27 DIAGNOSIS — M47816 Spondylosis without myelopathy or radiculopathy, lumbar region: Secondary | ICD-10-CM | POA: Diagnosis not present

## 2016-04-27 MED FILL — HYDROCODON-APAP 5-325: 5-325 | 5 days supply | Qty: 60 | Fill #0

## 2016-04-27 MED FILL — IBANDRONATE NA 150 MG TAB: 150 | 90 days supply | Qty: 3 | Fill #3

## 2016-05-05 DIAGNOSIS — M4316 Spondylolisthesis, lumbar region: Secondary | ICD-10-CM | POA: Diagnosis not present

## 2016-05-05 MED FILL — METHYLPREDNISOLONE 4 MG TAB: 4 | 6 days supply | Qty: 21 | Fill #0

## 2016-05-11 DIAGNOSIS — M5416 Radiculopathy, lumbar region: Secondary | ICD-10-CM | POA: Diagnosis not present

## 2016-05-11 DIAGNOSIS — Z981 Arthrodesis status: Secondary | ICD-10-CM | POA: Diagnosis not present

## 2016-05-11 DIAGNOSIS — M4316 Spondylolisthesis, lumbar region: Secondary | ICD-10-CM | POA: Diagnosis not present

## 2016-05-11 MED FILL — BUTALBITAL/APAP/CAFFEINE TB: 50-325-40 | 10 days supply | Qty: 60 | Fill #0

## 2016-05-17 ENCOUNTER — Other Ambulatory Visit: Payer: Self-pay | Admitting: Pharmacist

## 2016-05-17 DIAGNOSIS — Z760 Encounter for issue of repeat prescription: Secondary | ICD-10-CM | POA: Diagnosis not present

## 2016-05-17 MED ORDER — TOFACITINIB CITRATE ER 11 MG PO TB24: 1.0000 | ORAL_TABLET | Freq: Every day | ORAL | 3 refills | Status: DC

## 2016-05-17 MED FILL — XELJANZ XR 11 MG TB24: 11 | 30 days supply | Qty: 30 | Fill #0

## 2016-05-24 DIAGNOSIS — M48062 Spinal stenosis, lumbar region with neurogenic claudication: Secondary | ICD-10-CM | POA: Diagnosis not present

## 2016-05-24 DIAGNOSIS — M5136 Other intervertebral disc degeneration, lumbar region: Secondary | ICD-10-CM | POA: Diagnosis not present

## 2016-05-24 DIAGNOSIS — M47816 Spondylosis without myelopathy or radiculopathy, lumbar region: Secondary | ICD-10-CM | POA: Diagnosis not present

## 2016-05-24 DIAGNOSIS — M5416 Radiculopathy, lumbar region: Secondary | ICD-10-CM | POA: Diagnosis not present

## 2016-05-24 DIAGNOSIS — M4316 Spondylolisthesis, lumbar region: Secondary | ICD-10-CM | POA: Diagnosis not present

## 2016-05-24 DIAGNOSIS — Z981 Arthrodesis status: Secondary | ICD-10-CM | POA: Diagnosis not present

## 2016-05-26 DIAGNOSIS — K05321 Chronic periodontitis, generalized, slight: Secondary | ICD-10-CM | POA: Diagnosis not present

## 2016-05-26 DIAGNOSIS — M48061 Spinal stenosis, lumbar region without neurogenic claudication: Secondary | ICD-10-CM | POA: Diagnosis not present

## 2016-05-26 DIAGNOSIS — Z1281 Encounter for screening for malignant neoplasm of oral cavity: Secondary | ICD-10-CM | POA: Diagnosis not present

## 2016-05-26 DIAGNOSIS — G4733 Obstructive sleep apnea (adult) (pediatric): Secondary | ICD-10-CM | POA: Diagnosis not present

## 2016-05-26 DIAGNOSIS — M48062 Spinal stenosis, lumbar region with neurogenic claudication: Secondary | ICD-10-CM | POA: Diagnosis not present

## 2016-05-27 ENCOUNTER — Other Ambulatory Visit: Payer: Self-pay | Admitting: Neurosurgery

## 2016-05-28 ENCOUNTER — Other Ambulatory Visit: Payer: Self-pay

## 2016-05-28 NOTE — Patient Outreach (Signed)
[place] [facility]) Care Management  05/28/2016  CATLYNN GRONDAHL [birth year] [id number]   Subjective: Telephone call to patient. Discussed UMR pre-op call. Patient voices understanding and agrees to pre-op call.    Objective: per chart review-patient with Spondylolisthesis, lumbar region scheduled for Revision of Lumbar Arthrodesis, possible right L5-S1 Foraminotomy on 06/04/2016. History of Lumbar Stenosis, rheumatoid arthritis.  Assessment: Received UMR pre-op referral on 5/23. Pre-Op call completed.  Ms. Shropshire states, "I am pretty straight". She reports she just had surgery a couple months ago, therefore is aware and has completed FMLA, disability benefits and requirments. She states she does not have the [place] benefit. She states she is aware of her benefits and remember's the previous presurgical screening call.   No other medical issues identified and no additional community resource information needs at this time.  Patient is agreeable to follow up post procedure call.  Plan: telephonic RNCM will follow up post discharge within 3 business days of notification.  Thea Silversmith, RN, MSN, [place] Coordinator Cell: [phone]

## 2016-06-01 MED FILL — busPIRone HCL 5 MG TABS: 5 | 30 days supply | Qty: 60 | Fill #2

## 2016-06-01 MED FILL — BUTALBITAL/APAP/CAFFEINE TB: 50-325-40 | 10 days supply | Qty: 60 | Fill #1

## 2016-06-01 NOTE — Pre-Procedure Instructions (Signed)
Anne Garrett  06/01/2016      [place], [place] - [address] [facility]. [address] [place] [address] Phone: [phone] Fax: [phone]  [id number] - [place], [place] - [address] [place] [address] Phone: [phone] Fax: [phone]    Your procedure is scheduled on 06-04-2016  Friday .  Report to [facility] Admitting at 6:00 A.M.  Call this number if you have problems the morning of surgery:  [phone]   Remember:  Do not eat food or drink liquids after midnight.   Take these medicines the morning of surgery with A SIP OF WATER buspirone(Buspar),clonazepam(Klonopin)Xeljanz    STOP ASPIRIN,ANTIINFLAMATORIES (IBUPROFEN,ALEVE,MOTRIN,ADVIL,GOODY'S POWDERS),HERBAL SUPPLEMENTS,FISH OIL,AND VITAMINS 5-7 DAYS PRIOR TO SURGERY  Do not wear jewelry, make-up or nail polish.  Do not wear lotions, powders, or perfumes, or deoderant.  Do not shave 48 hours prior to surgery.  Men may shave face and neck.  Do not bring valuables to the hospital.  [facility] is not responsible for any belongings or valuables.  Contacts, dentures or bridgework may not be worn into surgery.  Leave your suitcase in the car.  After surgery it may be brought to your room.  For patients admitted to the hospital, discharge time will be determined by your treatment team.  Patients discharged the day of surgery will not be allowed to drive home.    Special Instructions: [facility] - Preparing for Surgery  Before surgery, you can play an important role.  Because skin is not sterile, your skin needs to be as free of germs as possible.  You can reduce the number of germs on you skin by washing with CHG (chlorahexidine gluconate) soap before surgery.  CHG is an antiseptic cleaner which kills germs and bonds with the skin to continue killing germs even after washing.  Please DO NOT use if you have an allergy to CHG or  antibacterial soaps.  If your skin becomes reddened/irritated stop using the CHG and inform your nurse when you arrive at Short Stay.  Do not shave (including legs and underarms) for at least 48 hours prior to the first CHG shower.  You may shave your face.  Please follow these instructions carefully:   1.  Shower with CHG Soap the night before surgery and the   morning of Surgery.  2.  If you choose to wash your hair, wash your hair first as usual with your normal shampoo.  3.  After you shampoo, rinse your hair and body thoroughly to remove the  Shampoo.  4.  Use CHG as you would any other liquid soap.  You can apply chg directly  to the skin and wash gently with scrungie or a clean washcloth.  5.  Apply the CHG Soap to your body ONLY FROM THE NECK DOWN.   Do not use on open wounds or open sores.  Avoid contact with your eyes,  ears, mouth and genitals (private parts).  Wash genitals (private parts) with your normal soap.  6.  Wash thoroughly, paying special attention to the area where your surgery will be performed.  7.  Thoroughly rinse your body with warm water from the neck down.  8.  DO NOT shower/wash with your normal soap after using and rinsing o  the CHG Soap.  9.  Pat yourself dry with a clean towel.  10.  Wear clean pajamas.            11.  Place clean sheets on your bed the night of your first shower and do not sleep with pets.  Day of Surgery  Do not apply any lotions/deodorants the morning of surgery.  Please wear clean clothes to the hospital/surgery center.   Please read over the following fact sheets that you were given. MRSA Information and Surgical Site Infection Prevention

## 2016-06-01 NOTE — Progress Notes (Signed)
Dr. Donnella Bi office called and requested orders.

## 2016-06-02 ENCOUNTER — Encounter (HOSPITAL_COMMUNITY)
Admission: RE | Admit: 2016-06-02 | Discharge: 2016-06-02 | Disposition: A | Discharge status: Home / Self Care | Payer: 59 | Source: Ambulatory Visit | Attending: Neurosurgery | Admitting: Neurosurgery

## 2016-06-02 ENCOUNTER — Other Ambulatory Visit: Payer: Self-pay | Admitting: Neurosurgery

## 2016-06-02 ENCOUNTER — Other Ambulatory Visit (HOSPITAL_COMMUNITY): Payer: Self-pay | Admitting: *Deleted

## 2016-06-02 ENCOUNTER — Encounter (HOSPITAL_COMMUNITY): Payer: Self-pay

## 2016-06-02 DIAGNOSIS — M810 Age-related osteoporosis without current pathological fracture: Secondary | ICD-10-CM | POA: Diagnosis not present

## 2016-06-02 DIAGNOSIS — M5416 Radiculopathy, lumbar region: Secondary | ICD-10-CM | POA: Diagnosis not present

## 2016-06-02 DIAGNOSIS — Z87891 Personal history of nicotine dependence: Secondary | ICD-10-CM | POA: Diagnosis not present

## 2016-06-02 DIAGNOSIS — M0800 Unspecified juvenile rheumatoid arthritis of unspecified site: Secondary | ICD-10-CM | POA: Diagnosis not present

## 2016-06-02 DIAGNOSIS — M4316 Spondylolisthesis, lumbar region: Secondary | ICD-10-CM | POA: Diagnosis not present

## 2016-06-02 DIAGNOSIS — G4730 Sleep apnea, unspecified: Secondary | ICD-10-CM | POA: Diagnosis not present

## 2016-06-02 DIAGNOSIS — M960 Pseudarthrosis after fusion or arthrodesis: Secondary | ICD-10-CM | POA: Diagnosis not present

## 2016-06-02 DIAGNOSIS — Z79899 Other long term (current) drug therapy: Secondary | ICD-10-CM | POA: Diagnosis not present

## 2016-06-02 DIAGNOSIS — Z96643 Presence of artificial hip joint, bilateral: Secondary | ICD-10-CM | POA: Diagnosis not present

## 2016-06-02 LAB — TYPE AND SCREEN
ABO/RH(D): A POS
Antibody Screen: NEGATIVE

## 2016-06-02 LAB — BASIC METABOLIC PANEL
Anion gap: 9 (ref 5–15)
BUN: <5 mg/dL — ABNORMAL LOW (ref 6–20)
CO2: 24 mmol/L (ref 22–32)
Calcium: 9.3 mg/dL (ref 8.9–10.3)
Chloride: 105 mmol/L (ref 101–111)
Creatinine, Ser: 0.42 mg/dL — ABNORMAL LOW (ref 0.44–1.00)
GFR calc Af Amer: >60 mL/min (ref >60)
GFR calc non Af Amer: >60 mL/min (ref >60)
Glucose, Bld: 85 mg/dL (ref 65–99)
Potassium: 3.9 mmol/L (ref 3.5–5.1)
Sodium: 138 mmol/L (ref 135–145)

## 2016-06-02 LAB — CBC
HCT: 40.7 % (ref 36.0–46.0)
Hemoglobin: 12.9 g/dL (ref 12.0–15.0)
MCH: 32.5 pg (ref 26.0–34.0)
MCHC: 31.7 g/dL (ref 30.0–36.0)
MCV: 102.5 fL — ABNORMAL HIGH (ref 78.0–100.0)
Platelets: 329 10*3/uL (ref 150–400)
RBC: 3.97 MIL/uL (ref 3.87–5.11)
RDW: 12.9 % (ref 11.5–15.5)
WBC: 5.6 10*3/uL (ref 4.0–10.5)

## 2016-06-02 LAB — SURGICAL PCR SCREEN
MRSA, PCR: NEGATIVE
Staphylococcus aureus: NEGATIVE

## 2016-06-02 LAB — HCG, SERUM, QUALITATIVE: Preg, Serum: NEGATIVE

## 2016-06-02 NOTE — Progress Notes (Addendum)
Pt. Wishes to speak with Dr. Sherwood Gambler before signing Maurine Minister has questions about the procedure.   Pt. States that she has not been given a definite diagnosis of sleep Apnea. She does use some type of dental device at night to help with snoring.

## 2016-06-04 ENCOUNTER — Inpatient Hospital Stay (HOSPITAL_COMMUNITY)
Admission: RE | Admit: 2016-06-04 | Discharge: 2016-06-05 | DRG: 460 | Disposition: A | Discharge status: Home / Self Care | Payer: 59 | Source: Ambulatory Visit | Attending: Neurosurgery | Admitting: Neurosurgery

## 2016-06-04 ENCOUNTER — Inpatient Hospital Stay (HOSPITAL_COMMUNITY): Payer: 59

## 2016-06-04 ENCOUNTER — Encounter (HOSPITAL_COMMUNITY)
Admission: RE | Disposition: A | Discharge status: Home / Self Care | Payer: Self-pay | Source: Ambulatory Visit | Attending: Neurosurgery

## 2016-06-04 ENCOUNTER — Inpatient Hospital Stay (HOSPITAL_COMMUNITY): Payer: 59 | Admitting: Anesthesiology

## 2016-06-04 ENCOUNTER — Encounter (HOSPITAL_COMMUNITY): Payer: Self-pay | Admitting: Anesthesiology

## 2016-06-04 DIAGNOSIS — M4326 Fusion of spine, lumbar region: Secondary | ICD-10-CM | POA: Diagnosis not present

## 2016-06-04 DIAGNOSIS — Z79899 Other long term (current) drug therapy: Secondary | ICD-10-CM

## 2016-06-04 DIAGNOSIS — M0800 Unspecified juvenile rheumatoid arthritis of unspecified site: Secondary | ICD-10-CM | POA: Diagnosis present

## 2016-06-04 DIAGNOSIS — G4730 Sleep apnea, unspecified: Secondary | ICD-10-CM | POA: Diagnosis not present

## 2016-06-04 DIAGNOSIS — M4807 Spinal stenosis, lumbosacral region: Secondary | ICD-10-CM | POA: Diagnosis not present

## 2016-06-04 DIAGNOSIS — S32009K Unspecified fracture of unspecified lumbar vertebra, subsequent encounter for fracture with nonunion: Secondary | ICD-10-CM | POA: Diagnosis present

## 2016-06-04 DIAGNOSIS — Z419 Encounter for procedure for purposes other than remedying health state, unspecified: Secondary | ICD-10-CM

## 2016-06-04 DIAGNOSIS — M4317 Spondylolisthesis, lumbosacral region: Secondary | ICD-10-CM | POA: Diagnosis not present

## 2016-06-04 DIAGNOSIS — Y838 Other surgical procedures as the cause of abnormal reaction of the patient, or of later complication, without mention of misadventure at the time of the procedure: Secondary | ICD-10-CM | POA: Diagnosis present

## 2016-06-04 DIAGNOSIS — M5417 Radiculopathy, lumbosacral region: Secondary | ICD-10-CM | POA: Diagnosis not present

## 2016-06-04 DIAGNOSIS — M5416 Radiculopathy, lumbar region: Secondary | ICD-10-CM | POA: Diagnosis not present

## 2016-06-04 DIAGNOSIS — M4316 Spondylolisthesis, lumbar region: Secondary | ICD-10-CM | POA: Diagnosis present

## 2016-06-04 DIAGNOSIS — M960 Pseudarthrosis after fusion or arthrodesis: Principal | ICD-10-CM | POA: Diagnosis present

## 2016-06-04 DIAGNOSIS — Z96643 Presence of artificial hip joint, bilateral: Secondary | ICD-10-CM | POA: Diagnosis present

## 2016-06-04 DIAGNOSIS — Z87891 Personal history of nicotine dependence: Secondary | ICD-10-CM

## 2016-06-04 DIAGNOSIS — M545 Low back pain: Secondary | ICD-10-CM | POA: Diagnosis present

## 2016-06-04 DIAGNOSIS — M1990 Unspecified osteoarthritis, unspecified site: Secondary | ICD-10-CM | POA: Diagnosis not present

## 2016-06-04 DIAGNOSIS — M810 Age-related osteoporosis without current pathological fracture: Secondary | ICD-10-CM | POA: Diagnosis present

## 2016-06-04 HISTORY — PX: OTHER SURGICAL HISTORY: SHX169

## 2016-06-04 SURGERY — POSTERIOR LUMBAR FUSION 1 LEVEL
Anesthesia: General | Site: Back

## 2016-06-04 MED ORDER — SODIUM CHLORIDE 0.9 % IJ SOLN
INTRAMUSCULAR | Status: AC
  Filled 2016-06-04: qty 10

## 2016-06-04 MED ORDER — MAGNESIUM HYDROXIDE 400 MG/5ML PO SUSP: 30.0000 mL | Freq: Every day | ORAL | Status: DC | PRN

## 2016-06-04 MED ORDER — BUPIVACAINE HCL (PF) 0.5 % IJ SOLN
INTRAMUSCULAR | Status: DC | PRN
  Administered 2016-06-04: 10 mL

## 2016-06-04 MED ORDER — PROMETHAZINE HCL 25 MG/ML IJ SOLN
6.2500 mg | INTRAMUSCULAR | Status: DC | PRN
Start: 2016-06-04 — End: 2016-06-04

## 2016-06-04 MED ORDER — KETOROLAC TROMETHAMINE 30 MG/ML IJ SOLN
15.0000 mg | Freq: Four times a day (QID) | INTRAMUSCULAR | Status: DC
  Administered 2016-06-04 – 2016-06-05 (×3): 15 mg via INTRAVENOUS
  Filled 2016-06-04 (×3): qty 1

## 2016-06-04 MED ORDER — LIDOCAINE-EPINEPHRINE 1 %-1:100000 IJ SOLN
INTRAMUSCULAR | Status: AC
  Filled 2016-06-04: qty 1

## 2016-06-04 MED ORDER — ONDANSETRON HCL 4 MG PO TABS: 4.0000 mg | ORAL_TABLET | Freq: Four times a day (QID) | ORAL | Status: DC | PRN

## 2016-06-04 MED ORDER — ACETAMINOPHEN 10 MG/ML IV SOLN
INTRAVENOUS | Status: DC | PRN
  Administered 2016-06-04: 1000 mg via INTRAVENOUS

## 2016-06-04 MED ORDER — BUSPIRONE HCL 5 MG PO TABS
5.0000 mg | ORAL_TABLET | Freq: Every day | ORAL | Status: DC
  Administered 2016-06-05: 5 mg via ORAL
  Filled 2016-06-04 (×2): qty 1

## 2016-06-04 MED ORDER — CHLORHEXIDINE GLUCONATE CLOTH 2 % EX PADS: 6.0000 | MEDICATED_PAD | Freq: Once | CUTANEOUS | Status: DC

## 2016-06-04 MED ORDER — ROCURONIUM BROMIDE 100 MG/10ML IV SOLN
INTRAVENOUS | Status: DC | PRN
  Administered 2016-06-04 (×2): 10 mg via INTRAVENOUS
  Administered 2016-06-04: 30 mg via INTRAVENOUS
  Administered 2016-06-04: 20 mg via INTRAVENOUS

## 2016-06-04 MED ORDER — PROPOFOL 10 MG/ML IV BOLUS
INTRAVENOUS | Status: AC
  Filled 2016-06-04: qty 20

## 2016-06-04 MED ORDER — BUTALBITAL-APAP-CAFFEINE 50-325-40 MG PO TABS: 1.0000 | ORAL_TABLET | Freq: Two times a day (BID) | ORAL | Status: DC | PRN

## 2016-06-04 MED ORDER — ROCURONIUM BROMIDE 10 MG/ML (PF) SYRINGE
PREFILLED_SYRINGE | INTRAVENOUS | Status: AC
  Filled 2016-06-04: qty 5

## 2016-06-04 MED ORDER — SODIUM CHLORIDE 0.9 % IR SOLN
Status: DC | PRN
  Administered 2016-06-04: 10:00:00

## 2016-06-04 MED ORDER — KETOROLAC TROMETHAMINE 15 MG/ML IJ SOLN
INTRAMUSCULAR | Status: AC
  Filled 2016-06-04: qty 1

## 2016-06-04 MED ORDER — ONDANSETRON HCL 4 MG/2ML IJ SOLN
INTRAMUSCULAR | Status: AC
  Filled 2016-06-04: qty 2

## 2016-06-04 MED ORDER — MIDAZOLAM HCL 5 MG/5ML IJ SOLN
INTRAMUSCULAR | Status: DC | PRN
  Administered 2016-06-04: 2 mg via INTRAVENOUS

## 2016-06-04 MED ORDER — LACTATED RINGERS IV SOLN
INTRAVENOUS | Status: DC | PRN
  Administered 2016-06-04 (×2): via INTRAVENOUS

## 2016-06-04 MED ORDER — DEXAMETHASONE SODIUM PHOSPHATE 10 MG/ML IJ SOLN
INTRAMUSCULAR | Status: AC
  Filled 2016-06-04: qty 1

## 2016-06-04 MED ORDER — CEFAZOLIN SODIUM-DEXTROSE 2-4 GM/100ML-% IV SOLN
INTRAVENOUS | Status: AC
  Filled 2016-06-04: qty 100

## 2016-06-04 MED ORDER — ACETAMINOPHEN 325 MG PO TABS: 650.0000 mg | ORAL_TABLET | ORAL | Status: DC | PRN

## 2016-06-04 MED ORDER — FENTANYL CITRATE (PF) 250 MCG/5ML IJ SOLN
INTRAMUSCULAR | Status: AC
  Filled 2016-06-04: qty 5

## 2016-06-04 MED ORDER — HYDROXYZINE HCL 25 MG PO TABS: 50.0000 mg | ORAL_TABLET | ORAL | Status: DC | PRN

## 2016-06-04 MED ORDER — BUPIVACAINE HCL (PF) 0.5 % IJ SOLN
INTRAMUSCULAR | Status: AC
  Filled 2016-06-04: qty 30

## 2016-06-04 MED ORDER — PHENYLEPHRINE HCL 10 MG/ML IJ SOLN
INTRAVENOUS | Status: DC | PRN
  Administered 2016-06-04: 10 ug/min via INTRAVENOUS

## 2016-06-04 MED ORDER — HYDROMORPHONE HCL 1 MG/ML IJ SOLN: 0.2500 mg | INTRAMUSCULAR | Status: DC | PRN

## 2016-06-04 MED ORDER — CYCLOBENZAPRINE HCL 5 MG PO TABS
5.0000 mg | ORAL_TABLET | Freq: Three times a day (TID) | ORAL | Status: DC | PRN
  Administered 2016-06-04 (×2): 5 mg via ORAL
  Filled 2016-06-04 (×2): qty 1

## 2016-06-04 MED ORDER — PHENOL 1.4 % MT LIQD: 1.0000 | OROMUCOSAL | Status: DC | PRN

## 2016-06-04 MED ORDER — SUFENTANIL CITRATE 50 MCG/ML IV SOLN
INTRAVENOUS | Status: DC | PRN
  Administered 2016-06-04: 5 ug via INTRAVENOUS
  Administered 2016-06-04: 10 ug via INTRAVENOUS
  Administered 2016-06-04 (×2): 5 ug via INTRAVENOUS

## 2016-06-04 MED ORDER — ACETAMINOPHEN 650 MG RE SUPP
650.0000 mg | RECTAL | Status: DC | PRN
Start: 2016-06-04 — End: 2016-06-05

## 2016-06-04 MED ORDER — SODIUM CHLORIDE 0.9% FLUSH
3.0000 mL | Freq: Two times a day (BID) | INTRAVENOUS | Status: DC
Start: 2016-06-04 — End: 2016-06-05
  Administered 2016-06-04: 3 mL via INTRAVENOUS

## 2016-06-04 MED ORDER — SUGAMMADEX SODIUM 200 MG/2ML IV SOLN
INTRAVENOUS | Status: AC
  Filled 2016-06-04: qty 2

## 2016-06-04 MED ORDER — 0.9 % SODIUM CHLORIDE (POUR BTL) OPTIME
TOPICAL | Status: DC | PRN
  Administered 2016-06-04: 1000 mL

## 2016-06-04 MED ORDER — ONDANSETRON HCL 4 MG/2ML IJ SOLN
INTRAMUSCULAR | Status: DC | PRN
  Administered 2016-06-04 (×2): 4 mg via INTRAVENOUS

## 2016-06-04 MED ORDER — MIDAZOLAM HCL 2 MG/2ML IJ SOLN
INTRAMUSCULAR | Status: AC
  Filled 2016-06-04: qty 2

## 2016-06-04 MED ORDER — EPHEDRINE 5 MG/ML INJ
INTRAVENOUS | Status: AC
  Filled 2016-06-04: qty 10

## 2016-06-04 MED ORDER — LACTATED RINGERS IV SOLN: INTRAVENOUS | Status: DC

## 2016-06-04 MED ORDER — EPHEDRINE SULFATE 50 MG/ML IJ SOLN
INTRAMUSCULAR | Status: DC | PRN
  Administered 2016-06-04: 2.5 mg via INTRAVENOUS
  Administered 2016-06-04: 5 mg via INTRAVENOUS

## 2016-06-04 MED ORDER — THROMBIN 5000 UNITS EX SOLR
CUTANEOUS | Status: AC
  Filled 2016-06-04: qty 5000

## 2016-06-04 MED ORDER — KCL IN DEXTROSE-NACL 20-5-0.45 MEQ/L-%-% IV SOLN
INTRAVENOUS | Status: DC
Start: 2016-06-04 — End: 2016-06-05

## 2016-06-04 MED ORDER — PROPOFOL 10 MG/ML IV BOLUS
INTRAVENOUS | Status: DC | PRN
  Administered 2016-06-04: 140 mg via INTRAVENOUS

## 2016-06-04 MED ORDER — MEPERIDINE HCL 25 MG/ML IJ SOLN: 6.2500 mg | INTRAMUSCULAR | Status: DC | PRN

## 2016-06-04 MED ORDER — PHENYLEPHRINE 40 MCG/ML (10ML) SYRINGE FOR IV PUSH (FOR BLOOD PRESSURE SUPPORT)
PREFILLED_SYRINGE | INTRAVENOUS | Status: AC
  Filled 2016-06-04: qty 10

## 2016-06-04 MED ORDER — CLONAZEPAM 0.5 MG PO TABS: 0.5000 mg | ORAL_TABLET | Freq: Every day | ORAL | Status: DC | PRN

## 2016-06-04 MED ORDER — KETOROLAC TROMETHAMINE 30 MG/ML IJ SOLN
15.0000 mg | Freq: Once | INTRAMUSCULAR | Status: AC
  Administered 2016-06-04: 15 mg via INTRAVENOUS

## 2016-06-04 MED ORDER — FENTANYL CITRATE (PF) 100 MCG/2ML IJ SOLN
INTRAMUSCULAR | Status: DC | PRN
  Administered 2016-06-04 (×2): 50 ug via INTRAVENOUS
  Administered 2016-06-04: 100 ug via INTRAVENOUS
  Administered 2016-06-04: 50 ug via INTRAVENOUS

## 2016-06-04 MED ORDER — ARTIFICIAL TEARS OPHTHALMIC OINT
TOPICAL_OINTMENT | OPHTHALMIC | Status: AC
  Filled 2016-06-04: qty 3.5

## 2016-06-04 MED ORDER — THROMBIN 20000 UNITS EX SOLR
CUTANEOUS | Status: AC
  Filled 2016-06-04: qty 20000

## 2016-06-04 MED ORDER — MENTHOL 3 MG MT LOZG: 1.0000 | LOZENGE | OROMUCOSAL | Status: DC | PRN

## 2016-06-04 MED ORDER — BISACODYL 10 MG RE SUPP: 10.0000 mg | Freq: Every day | RECTAL | Status: DC | PRN

## 2016-06-04 MED ORDER — HYDROXYZINE HCL 50 MG/ML IM SOLN: 50.0000 mg | INTRAMUSCULAR | Status: DC | PRN

## 2016-06-04 MED ORDER — GABAPENTIN 300 MG PO CAPS
300.0000 mg | ORAL_CAPSULE | Freq: Three times a day (TID) | ORAL | Status: DC
  Administered 2016-06-04 (×2): 300 mg via ORAL
  Filled 2016-06-04 (×2): qty 1

## 2016-06-04 MED ORDER — PREDNISONE 5 MG PO TABS
5.0000 mg | ORAL_TABLET | Freq: Every evening | ORAL | Status: DC
  Administered 2016-06-04: 5 mg via ORAL
  Filled 2016-06-04: qty 1

## 2016-06-04 MED ORDER — CEFAZOLIN SODIUM-DEXTROSE 2-4 GM/100ML-% IV SOLN
2.0000 g | INTRAVENOUS | Status: AC
  Administered 2016-06-04: 2 g via INTRAVENOUS

## 2016-06-04 MED ORDER — LIDOCAINE-EPINEPHRINE 1 %-1:100000 IJ SOLN
INTRAMUSCULAR | Status: DC | PRN
  Administered 2016-06-04: 10 mL

## 2016-06-04 MED ORDER — GELATIN ABSORBABLE MT POWD
OROMUCOSAL | Status: DC | PRN
  Administered 2016-06-04: 10:00:00 via TOPICAL

## 2016-06-04 MED ORDER — SUCCINYLCHOLINE CHLORIDE 20 MG/ML IJ SOLN
INTRAMUSCULAR | Status: DC | PRN
  Administered 2016-06-04: 120 mg via INTRAVENOUS

## 2016-06-04 MED ORDER — ONDANSETRON HCL 4 MG/2ML IJ SOLN: 4.0000 mg | Freq: Four times a day (QID) | INTRAMUSCULAR | Status: DC | PRN

## 2016-06-04 MED ORDER — SUFENTANIL CITRATE 50 MCG/ML IV SOLN
INTRAVENOUS | Status: AC
  Filled 2016-06-04: qty 1

## 2016-06-04 MED ORDER — MORPHINE SULFATE (PF) 4 MG/ML IV SOLN
4.0000 mg | INTRAVENOUS | Status: DC | PRN
  Administered 2016-06-04: 4 mg via INTRAMUSCULAR
  Filled 2016-06-04: qty 1

## 2016-06-04 MED ORDER — HYDROCODONE-ACETAMINOPHEN 5-325 MG PO TABS
1.0000 | ORAL_TABLET | ORAL | Status: DC | PRN
  Administered 2016-06-04 – 2016-06-05 (×4): 2 via ORAL
  Filled 2016-06-04 (×4): qty 2

## 2016-06-04 MED ORDER — FLEET ENEMA 7-19 GM/118ML RE ENEM: 1.0000 | ENEMA | Freq: Once | RECTAL | Status: DC | PRN

## 2016-06-04 MED ORDER — SUGAMMADEX SODIUM 200 MG/2ML IV SOLN
INTRAVENOUS | Status: DC | PRN
  Administered 2016-06-04: 80 mg via INTRAVENOUS

## 2016-06-04 MED ORDER — DEXAMETHASONE SODIUM PHOSPHATE 10 MG/ML IJ SOLN
INTRAMUSCULAR | Status: DC | PRN
  Administered 2016-06-04: 10 mg via INTRAVENOUS

## 2016-06-04 MED ORDER — ALUM & MAG HYDROXIDE-SIMETH 200-200-20 MG/5ML PO SUSP: 30.0000 mL | Freq: Four times a day (QID) | ORAL | Status: DC | PRN

## 2016-06-04 MED ORDER — THROMBIN 20000 UNITS EX SOLR
CUTANEOUS | Status: DC | PRN
  Administered 2016-06-04: 10:00:00 via TOPICAL

## 2016-06-04 MED ORDER — ACETAMINOPHEN 10 MG/ML IV SOLN
INTRAVENOUS | Status: AC
  Filled 2016-06-04: qty 100

## 2016-06-04 MED ORDER — SODIUM CHLORIDE 0.9% FLUSH: 3.0000 mL | INTRAVENOUS | Status: DC | PRN

## 2016-06-04 MED ORDER — LIDOCAINE 2% (20 MG/ML) 5 ML SYRINGE
INTRAMUSCULAR | Status: AC
  Filled 2016-06-04: qty 5

## 2016-06-04 MED ORDER — ARTIFICIAL TEARS OPHTHALMIC OINT
TOPICAL_OINTMENT | OPHTHALMIC | Status: DC | PRN
  Administered 2016-06-04: 1 via OPHTHALMIC

## 2016-06-04 SURGICAL SUPPLY — 70 items
BAG DECANTER FOR FLEXI CONT (MISCELLANEOUS) ×2 IMPLANT
BLADE CLIPPER SURG (BLADE) IMPLANT
BUR ACRON 5.0MM COATED (BURR) ×2 IMPLANT
BUR MATCHSTICK NEURO 3.0 LAGG (BURR) ×2 IMPLANT
CANISTER SUCT 3000ML PPV (MISCELLANEOUS) ×2 IMPLANT
CAP LCK SPNE (Orthopedic Implant) ×2 IMPLANT
CAP LOCK SPINE RADIUS (Orthopedic Implant) ×2 IMPLANT
CAP LOCKING (Orthopedic Implant) ×2 IMPLANT
CARTRIDGE OIL MAESTRO DRILL (MISCELLANEOUS) ×1 IMPLANT
CONT SPEC 4OZ CLIKSEAL STRL BL (MISCELLANEOUS) ×2 IMPLANT
COVER BACK TABLE 60X90IN (DRAPES) ×2 IMPLANT
DERMABOND ADVANCED (GAUZE/BANDAGES/DRESSINGS) ×1
DERMABOND ADVANCED .7 DNX12 (GAUZE/BANDAGES/DRESSINGS) ×1 IMPLANT
DIFFUSER DRILL AIR PNEUMATIC (MISCELLANEOUS) ×2 IMPLANT
DRAPE C-ARM 42X72 X-RAY (DRAPES) ×4 IMPLANT
DRAPE C-ARMOR (DRAPES) ×2 IMPLANT
DRAPE HALF SHEET 40X57 (DRAPES) ×2 IMPLANT
DRAPE LAPAROTOMY 100X72X124 (DRAPES) ×2 IMPLANT
DRAPE POUCH INSTRU U-SHP 10X18 (DRAPES) ×2 IMPLANT
ELECT REM PT RETURN 9FT ADLT (ELECTROSURGICAL) ×2
ELECTRODE REM PT RTRN 9FT ADLT (ELECTROSURGICAL) ×1 IMPLANT
GAUZE SPONGE 4X4 12PLY STRL (GAUZE/BANDAGES/DRESSINGS) ×2 IMPLANT
GAUZE SPONGE 4X4 16PLY XRAY LF (GAUZE/BANDAGES/DRESSINGS) IMPLANT
GLOVE BIO SURGEON STRL SZ8.5 (GLOVE) ×2 IMPLANT
GLOVE BIOGEL PI IND STRL 7.5 (GLOVE) ×3 IMPLANT
GLOVE BIOGEL PI IND STRL 8 (GLOVE) ×2 IMPLANT
GLOVE BIOGEL PI INDICATOR 7.5 (GLOVE) ×3
GLOVE BIOGEL PI INDICATOR 8 (GLOVE) ×2
GLOVE ECLIPSE 7.0 STRL STRAW (GLOVE) ×4 IMPLANT
GLOVE ECLIPSE 7.5 STRL STRAW (GLOVE) ×4 IMPLANT
GLOVE INDICATOR 8.5 STRL (GLOVE) ×2 IMPLANT
GLOVE SURG SS PI 7.0 STRL IVOR (GLOVE) ×6 IMPLANT
GOWN STRL REUS W/ TWL LRG LVL3 (GOWN DISPOSABLE) ×1 IMPLANT
GOWN STRL REUS W/ TWL XL LVL3 (GOWN DISPOSABLE) ×2 IMPLANT
GOWN STRL REUS W/TWL 2XL LVL3 (GOWN DISPOSABLE) ×2 IMPLANT
GOWN STRL REUS W/TWL LRG LVL3 (GOWN DISPOSABLE) ×1
GOWN STRL REUS W/TWL XL LVL3 (GOWN DISPOSABLE) ×2
HEMOSTAT POWDER KIT SURGIFOAM (HEMOSTASIS) ×2 IMPLANT
KIT BASIN OR (CUSTOM PROCEDURE TRAY) ×2 IMPLANT
KIT INFUSE XX SMALL 0.7CC (Orthopedic Implant) ×2 IMPLANT
KIT ROOM TURNOVER OR (KITS) ×2 IMPLANT
NEEDLE ASP BONE MRW 8GX15 (NEEDLE) ×2 IMPLANT
NEEDLE SPNL 18GX3.5 QUINCKE PK (NEEDLE) ×4 IMPLANT
NEEDLE SPNL 22GX3.5 QUINCKE BK (NEEDLE) ×2 IMPLANT
NS IRRIG 1000ML POUR BTL (IV SOLUTION) ×2 IMPLANT
OIL CARTRIDGE MAESTRO DRILL (MISCELLANEOUS) ×2
PACK LAMINECTOMY NEURO (CUSTOM PROCEDURE TRAY) ×2 IMPLANT
PAD ARMBOARD 7.5X6 YLW CONV (MISCELLANEOUS) ×6 IMPLANT
PATTIES SURGICAL .5 X.5 (GAUZE/BANDAGES/DRESSINGS) IMPLANT
PATTIES SURGICAL .5 X1 (DISPOSABLE) IMPLANT
PATTIES SURGICAL 1X1 (DISPOSABLE) IMPLANT
ROD 70MM (Rod) ×1 IMPLANT
ROD SPNL 70X5.5 NS TI RDS (Rod) ×1 IMPLANT
SCREW 5.75 X 635 (Screw) ×2 IMPLANT
SCREW 5.75X40M (Screw) ×2 IMPLANT
SCREW 7.75X35MM (Screw) ×2 IMPLANT
SPONGE LAP 4X18 X RAY DECT (DISPOSABLE) IMPLANT
SPONGE NEURO XRAY DETECT 1X3 (DISPOSABLE) IMPLANT
SPONGE SURGIFOAM ABS GEL 100 (HEMOSTASIS) ×2 IMPLANT
STRIP BIOACTIVE VITOSS 25X100X (Neuro Prosthesis/Implant) ×2 IMPLANT
SUT VIC AB 1 CT1 18XBRD ANBCTR (SUTURE) ×2 IMPLANT
SUT VIC AB 1 CT1 8-18 (SUTURE) ×2
SUT VIC AB 2-0 CP2 18 (SUTURE) ×6 IMPLANT
SYR 3ML LL SCALE MARK (SYRINGE) IMPLANT
SYR CONTROL 10ML LL (SYRINGE) ×2 IMPLANT
TAPE CLOTH SURG 4X10 WHT LF (GAUZE/BANDAGES/DRESSINGS) ×2 IMPLANT
TOWEL GREEN STERILE (TOWEL DISPOSABLE) ×2 IMPLANT
TOWEL GREEN STERILE FF (TOWEL DISPOSABLE) ×2 IMPLANT
TRAY FOLEY W/METER SILVER 16FR (SET/KITS/TRAYS/PACK) ×2 IMPLANT
WATER STERILE IRR 1000ML POUR (IV SOLUTION) ×2 IMPLANT

## 2016-06-04 NOTE — Transfer of Care (Signed)
Immediate Anesthesia Transfer of Care Note  Patient: Anne Garrett  Procedure(s) Performed: Procedure(s): Revision of Lumbosacral Arthrodesis with revision of posterior instrumentation (N/A)  Patient Location: PACU  Anesthesia Type:General  Level of Consciousness: awake, alert , oriented and patient cooperative  Airway & Oxygen Therapy: Patient Spontanous Breathing and Patient connected to nasal cannula oxygen  Post-op Assessment: Report given to RN, Post -op Vital signs reviewed and stable, Patient moving all extremities and Patient moving all extremities X 4  Post vital signs: Reviewed and stable  Last Vitals:  Vitals:   06/04/16 0627 06/04/16 1139  BP: 116/85   Pulse: 70   Resp: 20   Temp:  (P) 36.1 C    Last Pain: There were no vitals filed for this visit.       Complications: No apparent anesthesia complications

## 2016-06-04 NOTE — H&P (Signed)
Subjective: Patient is a 41 y.o. right-handed white female with a history of juvenile rheumatoid arthritis since age 9 months who is admitted for revision of a pseudoarthrosis of a L5-S1 arthrodesis. Patient is about 8 weeks status post a bilateral L5-S1 lumbar decompression and arthrodesis. She did well initially following surgery, however she developed right lumbar radicular pain and numbness during the postoperative course, and x-rays and subsequent CT scan revealed disruption of the right L5 and S1 posterior instrumentation construct consistent with pseudoarthrosis. X-rays and CT scan showed that the spondylolisthesis present prior to surgery, and that corrected intraoperatively had recurred; and also that the left L5 and S1 posterior instrumentation construct remained intact. Because of persistence of right lumbar radiculopathy, the decision was made proceed with revision of the right L5-S1 posterior instrumentation construct, with removal of the existing right-sided posterior instrumentation and placement of a right L4 pedicle screw and a right S1 alar screw. The patient is admitted for surgery.   Patient Active Problem List   Diagnosis Date Noted  . Lumbar pseudoarthrosis 06/04/2016  . Lumbar stenosis 04/08/2016  . S/P cervical spinal fusion 07/13/2015  . Cervical stenosis of spinal canal 07/12/2015  . HNP (herniated nucleus pulposus), cervical 06/19/2015  . Numbness 05/20/2015  . Chronic rheumatic arthritis ([place]) 05/20/2015  . Carpal tunnel syndrome 05/20/2015  . Snoring 05/20/2015  . Excessive daytime sleepiness 05/20/2015   Past Medical History:  Diagnosis Date  . Adrenal insufficiency ([place])    secondary to Prednisone  . Eczema    coner oif eye lid  . Headache    Migraine  . Heart murmur    only hear once slight  . Intervertebral disk disease   . Osteoporosis   . Rheumatoid arthritis ([place])    Juvenile onset  . Sleep apnea    questionable  uses dental device at night to help  with snoring    Past Surgical History:  Procedure Laterality Date  . ANKLE ARTHROPLASTY Right   . ANTERIOR CERVICAL DECOMP/DISCECTOMY FUSION N/A 06/19/2015   Procedure: C3-4 C4-5 Anterior cervical decompression/diskectomy/fusion;  Surgeon: Jovita Gamma, MD;  Location: [facility];  Service: Neurosurgery;  Laterality: N/A;  C3-4 C4-5 Anterior cervical decompression/diskectomy/fusion  . APPENDECTOMY    . CARPAL TUNNEL RELEASE Right   . CESAREAN SECTION    . CHOLECYSTECTOMY    . COLONOSCOPY    . ESSURE TUBAL LIGATION     Essure Implant-Permanent birth control  . excision to palm Right    excision of mass to right plam  . JOINT REPLACEMENT Bilateral    bilat hip replacements  . ORIF FEMUR FRACTURE Right   . plif l5-s1 arthrodeisis    2018  . POSTERIOR CERVICAL FUSION/FORAMINOTOMY N/A 07/12/2015   Procedure: C2 to C5 Cervical laminectomy, C2 to C5 posterior cervical arthrodesis with instrumentation and bone graft;  Surgeon: Jovita Gamma, MD;  Location: [facility];  Service: Neurosurgery;  Laterality: N/A;  C2 to C5 Cervical laminectomy, C2  to C5 posterior cervical arthrodesis with instrumentation and bone graft  . right foot surgery Right    right jfoot arthrodesis and right ankle arthroplasty  . WRIST SURGERY Bilateral    arthrodesis    Prescriptions Prior to Admission  Medication Sig Dispense Refill Last Dose  . Ascorbic Acid (VITAMIN C PO) Take 1 tablet by mouth daily.   Past Week at Unknown time  . busPIRone (BUSPAR) 5 MG tablet Take 5 mg by mouth daily.    06/03/2016 at Unknown time  .  butalbital-acetaminophen-caffeine (FIORICET, ESGIC) 50-325-40 MG tablet Take 1 tablet by mouth 2 (two) times daily as needed for headache.    06/04/2016 at Unknown time  . clonazePAM (KLONOPIN) 1 MG tablet Take 0.5 mg by mouth daily as needed for anxiety.   Past Week at Unknown time  . ibandronate (BONIVA) 150 MG tablet Take 150 mg by mouth every 30 (thirty) days. Take in the morning with a full  glass of water, on an empty stomach, and do not take anything else by mouth or lie down for the next 30 min.   Taking  . ibuprofen (ADVIL,MOTRIN) 200 MG tablet Take 800 mg by mouth every 8 (eight) hours as needed for mild pain.    Past Week at Unknown time  . Multiple Vitamin (MULTIVITAMIN) tablet Take 1 tablet by mouth daily.   Past Week at Unknown time  . predniSONE (DELTASONE) 5 MG tablet Take 5 mg by mouth every evening.    06/03/2016 at Unknown time  . triamcinolone ointment (KENALOG) 0.1 % Apply 1 application topically 2 (two) times daily as needed (eczema).   Taking  . Vitamin D, Ergocalciferol, (DRISDOL) 50000 units CAPS capsule Take 50,000 Units by mouth every 30 (thirty) days. Takes with Boniva   Past Week at Unknown time  . HYDROcodone-acetaminophen (NORCO/VICODIN) 5-325 MG tablet Take 1-2 tablets by mouth every 4 (four) hours as needed (pain). 60 tablet 0 Taking  . Tofacitinib Citrate (XELJANZ XR) 11 MG TB24 Take 1 tablet by mouth daily. (Patient taking differently: Take 11 mg by mouth daily. ) 30 tablet 3    Allergies  Allergen Reactions  . Sulfa Antibiotics Itching and Rash    Head to toe  . Teriparatide Other (See Comments)    Severe Muscle Pain, Forteo  . Valproic Acid And Related Rash    Head to toe  . Celexa [Citalopram] Rash       . Leflunomide Diarrhea  . Methotrexate Diarrhea and Other (See Comments)    Fatigue  . Paroxetine Hcl Diarrhea  . Zoloft [Sertraline] Rash    Social History  Substance Use Topics  . Smoking status: Former Smoker    Packs/day: 0.25    Years: 20.00    Types: Cigarettes    Quit date: 07/02/2015  . Smokeless tobacco: Never Used  . Alcohol use 1.2 oz/week    2 Glasses of wine per week     Comment: social    Family History  Problem Relation Age of Onset  . Breast cancer Mother 47  . Nephritis Father   . Epilepsy Father   . Breast cancer Maternal Aunt        2 mat aunts   . Multiple sclerosis Maternal Uncle   . Psoriasis Paternal Aunt       Review of Systems A comprehensive review of systems was negative.  Objective: Vital signs in last 24 hours: Temp:  [97 F (36.1 C)] 97 F (36.1 C) (06/01 1139) Pulse Rate:  [70-96] 96 (06/01 1139) Resp:  [17-20] 17 (06/01 1139) BP: (113-116)/(78-85) 113/78 (06/01 1139) SpO2:  [98 %-100 %] 100 % (06/01 1139)  EXAM: Patient is a thin white female in discomfort but no acute distress. Lungs are clear to auscultation , the patient has symmetrical respiratory excursion. Heart has a regular rate and rhythm normal S1 and S2 no murmur.   Abdomen is soft nontender nondistended bowel sounds are present. Extremity examination shows deformities consistent with her long-standing juvenile rheumatoid arthritis as well as her previous  orthopedic procedures, but no clubbing cyanosis or edema. Motor examination shows 5 over 5 strength in the lower extremities including the iliopsoas quadriceps dorsiflexor extensor hallicus  longus and plantar flexor bilaterally. Sensation is intact to pinprick in the distal lower extremities. Reflexes are symmetrical bilaterally. No pathologic reflexes are present. Patient has a normal gait and stance.   Data Review:CBC    Component Value Date/Time   WBC 5.6 06/02/2016 1352   RBC 3.97 06/02/2016 1352   HGB 12.9 06/02/2016 1352   HCT 40.7 06/02/2016 1352   PLT 329 06/02/2016 1352   MCV 102.5 (H) 06/02/2016 1352   MCH 32.5 06/02/2016 1352   MCHC 31.7 06/02/2016 1352   RDW 12.9 06/02/2016 1352   LYMPHSABS 2.9 05/15/2015 1013   MONOABS 0.7 05/15/2015 1013   EOSABS 0.1 05/15/2015 1013   BASOSABS 0.0 05/15/2015 1013                          BMET    Component Value Date/Time   NA 138 06/02/2016 1352   K 3.9 06/02/2016 1352   CL 105 06/02/2016 1352   CO2 24 06/02/2016 1352   GLUCOSE 85 06/02/2016 1352   BUN <5 (L) 06/02/2016 1352   CREATININE 0.42 (L) 06/02/2016 1352   CALCIUM 9.3 06/02/2016 1352   GFRNONAA >60 06/02/2016 1352   GFRAA >60 06/02/2016 1352      Assessment/Plan: Patient with pseudoarthrosis of L5-S1 fusion construct, who is admitted for revision of the right-sided lumbosacral arthrodesis as described above.  I've discussed with the patient the nature of his condition, the nature the surgical procedure, the typical length of surgery, hospital stay, and overall recuperation, the limitations postoperatively, and risks of surgery. I discussed risks including risks of infection, bleeding, possibly need for transfusion, the risk of nerve root dysfunction with pain, weakness, numbness, or paresthesias, the risk of dural tear and CSF leakage and possible need for further surgery, the risk of failure of the arthrodesis and possibly for further surgery, the risk of anesthetic complications including myocardial infarction, stroke, pneumonia, and death. We discussed the need for postoperative immobilization in a lumbar brace. Understanding all this the patient does wish to proceed with surgery and is admitted for such.     Hosie Spangle, MD 06/04/2016 11:50 AM

## 2016-06-04 NOTE — Progress Notes (Signed)
Vitals:   06/04/16 1211 06/04/16 1228 06/04/16 1257 06/04/16 1607  BP: 103/62 107/74 (!) 109/57 97/60  Pulse: 95 (!) 51 (!) 51 (!) 48  Resp: 20 18 18 18   Temp:  (!) 96.4 F (35.8 C) 98.3 F (36.8 C) 98.5 F (36.9 C)  SpO2: 100% 99% 98% 100%    CBC  Recent Labs  06/02/16 1352  WBC 5.6  HGB 12.9  HCT 40.7  PLT 329   BMET  Recent Labs  06/02/16 1352  NA 138  K 3.9  CL 105  CO2 24  GLUCOSE 85  BUN <5*  CREATININE 0.42*  CALCIUM 9.3    Patient with exacerbation of her right lumbar radicular pain and numbness following surgery earlier today. Did ambulate a short distance in the halls. Dressing clean and dry. Foley DC'd, but patient has not yet voided. Nursing staff to monitor voiding function.  Plan: We will add gabapentin 300 mg 3 times a day, starting now. Patient to continue to ambulate.  Hosie Spangle, MD 06/04/2016, 5:37 PM

## 2016-06-04 NOTE — Op Note (Signed)
06/04/2016  11:36 AM  PATIENT:  Anne Garrett  41 y.o. female  PRE-OPERATIVE DIAGNOSIS:  L5-S1 pseudoarthrosis, lumbar radiculopathy  POST-OPERATIVE DIAGNOSIS:  L5-S1 pseudoarthrosis, lumbar radiculopathy  PROCEDURE:  Procedure(s):  Revision of right Lumbosacral Arthrodesis with revision of posterior instrumentation, with a right L4-S1 posterior lateral arthrodesis with nonsegmental radius posterior instrumentation, Vitoss BA, and infuse. Removal of existing right-sided posterior instrumentation.  SURGEON:  Surgeon(s): Jovita Gamma, MD Kary Kos, MD  ASSISTANTS: Kary Kos, M.D.  ANESTHESIA:   general  EBL:  Total I/O In: 1000 [I.V.:1000] Out: 500 [Urine:450; Blood:50]  BLOOD ADMINISTERED:none  COUNT: Correct per nursing staff  DICTATION: Patient was brought to the operating room, placed under general endotracheal anesthesia. Patient was turned to a prone position, body prominences were padded as needed. The lumbar region was prepped with Betadine soap and solution and draped in a sterile fashion. The previous midline incision was marked, and an x-ray taken and the L4-S1 levels identified. The midline was infiltrated with local aesthetic with epinephrine, and a midline incision was made, through the previous midline incision and extended rostrally. Dissection was carried down to subcutaneous tissue to the lumbar fascia which was incised on the right side of the midline. The paraspinal musculature was dissected from the spinous process and lamina, and the L3-4 and L4-5 interlaminar spaces were identified. Dissection was then carried out laterally, and the existing right L5-S1 posterior instrumentation was identified. Using the counter torque and final tightener, the locking caps were unlocked and removed. We then removed the rod and each of the 2 screws. Scar tissue around the previous fusion construct was carefully removed. We examined the remaining portion of the right L5 inferior  articular process. No obvious fracture fragment was seen. The C-arm fluoroscope was then draped and brought into field to the guide placement of a right L4 pedicle screw and a right S1 alar screw. An entry point was selected for the right L4 pedicle screw. The right L4 pedicle was probed with C-arm fluoroscopic guidance. It was then examined with the ball probe, good bony surfaces were found. The right L4 pedicle was then tapped with a 5.25 mm tap, it was again examined with the ball probe, and good bony surfaces and threading were noted, and a 5.75 x 35 mm radius screw was placed. An entry point was then selected for the right S1 alar screw. The right S1 ala was probed with C-arm fluoroscopic guidance. It was then examined with the ball probe, good bony surfaces were found. The right S1 ala was then tapped with a 6.25 mm tap, it was again examined with the ball probe, and good bony surfaces and threading were noted, and a 7.75 x 35 mm radius screw was placed. The bony surfaces of L4, L5, and S1 were decorticated with the high-speed drill. We then selected a 70 mm pre-lordosed rods. It was further lordosed, and then placed within the screw heads and secured with locking caps. Once both locking caps were placed, final tightening was performed against a counter torque. We then packed infuse and Vitoss BA over the posterior bony surfaces of L4, L5, and S1. We then proceeded with closure. The wound had been irrigated numerous times throughout the procedure with both saline solution and bacitracin solution. Deep fascia closed interrupted undyed 1 Vicryl suture, Scarpa's fascia was closed interrupted undyed 1 Vicryl suture, the subcutaneous and subcuticular closed interrupted inverted 2-0 undyed Vicryl sutures. Skin edges were approximate Dermabond. A dressing of sterile gauze and  Hypafix was applied. Following surgery the patient was turned back to a supine position, to be reversed from the anesthetic, extubated, and  transferred to the recovery room for further care.  PLAN OF CARE: Admit to inpatient   PATIENT DISPOSITION:  PACU - hemodynamically stable.   Delay start of Pharmacological VTE agent (>24hrs) due to surgical blood loss or risk of bleeding:  yes

## 2016-06-04 NOTE — Anesthesia Procedure Notes (Signed)
Procedure Name: Intubation Date/Time: 06/04/2016 8:17 AM Performed by: Izora Gala Pre-anesthesia Checklist: Patient identified, Emergency Drugs available, Suction available and Patient being monitored Patient Re-evaluated:Patient Re-evaluated prior to inductionOxygen Delivery Method: Circle system utilized Preoxygenation: Pre-oxygenation with 100% oxygen Intubation Type: IV induction Ventilation: Mask ventilation without difficulty Laryngoscope Size: 3 and Glidescope Grade View: Grade I Tube type: Oral Tube size: 7.0 mm Number of attempts: 1 Airway Equipment and Method: Video-laryngoscopy Placement Confirmation: ETT inserted through vocal cords under direct vision,  positive ETCO2 and breath sounds checked- equal and bilateral Secured at: 20 cm Tube secured with: Tape Dental Injury: Teeth and Oropharynx as per pre-operative assessment

## 2016-06-04 NOTE — Anesthesia Preprocedure Evaluation (Addendum)
Anesthesia Evaluation  Patient identified by MRN, date of birth, ID band Patient awake    Reviewed: Allergy & Precautions, NPO status , Patient's Chart, lab work & pertinent test results  Airway Mallampati: II  TM Distance: >3 FB Neck ROM: Full    Dental  (+) Teeth Intact, Dental Advisory Given   Pulmonary sleep apnea , former smoker,    breath sounds clear to auscultation       Cardiovascular negative cardio ROS   Rhythm:Regular Rate:Normal     Neuro/Psych  Headaches,  Neuromuscular disease negative psych ROS   GI/Hepatic negative GI ROS, Neg liver ROS,   Endo/Other  negative endocrine ROSAdrenal Insuff.   Renal/GU negative Renal ROS  negative genitourinary   Musculoskeletal  (+) Arthritis , Rheumatoid disorders,    Abdominal   Peds negative pediatric ROS (+)  Hematology negative hematology ROS (+)   Anesthesia Other Findings   Reproductive/Obstetrics negative OB ROS                            Lab Results  Component Value Date   WBC 5.6 06/02/2016   HGB 12.9 06/02/2016   HCT 40.7 06/02/2016   MCV 102.5 (H) 06/02/2016   PLT 329 06/02/2016   Lab Results  Component Value Date   CREATININE 0.42 (L) 06/02/2016   BUN <5 (L) 06/02/2016   NA 138 06/02/2016   K 3.9 06/02/2016   CL 105 06/02/2016   CO2 24 06/02/2016   No results found for: INR, PROTIME   Anesthesia Physical Anesthesia Plan  ASA: III  Anesthesia Plan: General   Post-op Pain Management:    Induction: Intravenous  Airway Management Planned: Oral ETT and Video Laryngoscope Planned  Additional Equipment:   Intra-op Plan:   Post-operative Plan: Extubation in OR  Informed Consent: I have reviewed the patients History and Physical, chart, labs and discussed the procedure including the risks, benefits and alternatives for the proposed anesthesia with the patient or authorized representative who has indicated  his/her understanding and acceptance.   Dental advisory given  Plan Discussed with: CRNA  Anesthesia Plan Comments: (Stress dose steroids. )       Anesthesia Quick Evaluation

## 2016-06-05 MED ORDER — OXYCODONE-ACETAMINOPHEN 5-325 MG PO TABS: 1.0000 | ORAL_TABLET | ORAL | 0 refills | Status: DC | PRN

## 2016-06-05 MED ORDER — HYDROCODONE-ACETAMINOPHEN 5-325 MG PO TABS: 1.0000 | ORAL_TABLET | ORAL | 0 refills | Status: DC | PRN

## 2016-06-05 MED ORDER — GABAPENTIN 300 MG PO CAPS: 600.0000 mg | ORAL_CAPSULE | Freq: Two times a day (BID) | ORAL | 6 refills | Status: DC

## 2016-06-05 MED ORDER — OXYCODONE-ACETAMINOPHEN 5-325 MG PO TABS: 1.0000 | ORAL_TABLET | ORAL | Status: DC | PRN

## 2016-06-05 MED ORDER — GABAPENTIN 300 MG PO CAPS
600.0000 mg | ORAL_CAPSULE | Freq: Two times a day (BID) | ORAL | 6 refills | Status: DC
Start: 2016-06-05 — End: 2016-06-05

## 2016-06-05 MED ORDER — CYCLOBENZAPRINE HCL 10 MG PO TABS: 10.0000 mg | ORAL_TABLET | Freq: Three times a day (TID) | ORAL | Status: DC | PRN

## 2016-06-05 MED ORDER — CYCLOBENZAPRINE HCL 10 MG PO TABS: 5.0000 mg | ORAL_TABLET | Freq: Three times a day (TID) | ORAL | 0 refills | Status: DC | PRN

## 2016-06-05 MED ORDER — GABAPENTIN 300 MG PO CAPS
600.0000 mg | ORAL_CAPSULE | Freq: Two times a day (BID) | ORAL | Status: DC
  Administered 2016-06-05: 600 mg via ORAL
  Filled 2016-06-05: qty 2

## 2016-06-05 NOTE — Discharge Instructions (Signed)

## 2016-06-05 NOTE — Progress Notes (Signed)
Pt and husband given D/C instructions with Rx's, verbal understanding was provided. Pt's IV was removed prior to D/C. Pt's incision is open to air with no sign of infection. Pt D/C'd home via wheelchair @ 0915 per MD order. Pt is stable @ D/C and has no other needs at this time. Holli Humbles, RN

## 2016-06-05 NOTE — Discharge Summary (Signed)
Physician Discharge Summary  Patient ID: Anne Garrett MRN: [id number] DOB/AGE: [birth year] 43 y.o.  Admit date: 06/04/2016 Discharge date: 06/05/2016  Admission Diagnoses:  L5-S1 pseudoarthrosis, lumbar radiculopathy  Discharge Diagnoses:  L5-S1 pseudoarthrosis, lumbar radiculopathy Active Problems:   Lumbar pseudoarthrosis   Discharged Condition: good  Hospital Course:  Patient was admitted, underwent revision of her right lumbosacral arthrodesis with removal of her existing L5 and S1 posterior instrumentation, and placement of an L4 pedicle screw and a S1 alar screw. Postoperatively she's been having disabling right lumbar radicular pain, running from the buttock down through the lateral right hip and to the right groin, as well as down to the right posterior thigh and calf. The pain is present when sitting and standing, but is typically relieved by lying down. Patient was started on gabapentin, and is also using hydrocodone and cyclobenzaprine. We removed her dressing, and the incision is healing nicely. There is no erythema, swelling, or drainage. Discussed management. I feel that she is going to need to continue on the gabapentin, and use hydrocodone for pain. We will also give her a prescription for oxycodone for breakthrough pain. We'll also give her a prescription for cyclobenzaprine. She's been given instructions regarding wound care and activities following discharge. She is to follow-up with me in 3 weeks. She is to update my medical assistant at the office on her condition in 5-6 days.  Discharge Exam: Blood pressure (!) 100/58, pulse 71, temperature 98.6 F (37 C), temperature source Oral, resp. rate 18, last menstrual period 05/19/2016, SpO2 98 %.  Disposition: 01-Home or Self Care  Discharge Instructions    Discharge wound care:    Complete by:  As directed    Leave the wound open to air. Shower daily with the wound uncovered. Water and soapy water should run over the incision  area. Do not wash directly on the incision for 2 weeks. Remove the glue after 2 weeks.   Driving Restrictions    Complete by:  As directed    No driving for 2 weeks. May ride in the car locally now. May begin to drive locally in 2 weeks.   Other Restrictions    Complete by:  As directed    Walk gradually increasing distances out in the fresh air at least twice a day. Walking additional 6 times inside the house, gradually increasing distances, daily. No bending, lifting, or twisting. Perform activities between shoulder and waist height (that is at counter height when standing or table height when sitting).     Allergies as of 06/05/2016      Reactions   Sulfa Antibiotics Itching, Rash   Head to toe   Teriparatide Other (See Comments)   Severe Muscle Pain, Forteo   Valproic Acid And Related Rash   Head to toe   Celexa [citalopram] Rash      Leflunomide Diarrhea   Methotrexate Diarrhea, Other (See Comments)   Fatigue   Paroxetine Hcl Diarrhea   Zoloft [sertraline] Rash      Medication List    TAKE these medications   busPIRone 5 MG tablet Commonly known as:  BUSPAR Take 5 mg by mouth daily.   butalbital-acetaminophen-caffeine 50-325-40 MG tablet Commonly known as:  FIORICET, ESGIC Take 1 tablet by mouth 2 (two) times daily as needed for headache.   clonazePAM 1 MG tablet Commonly known as:  KLONOPIN Take 0.5 mg by mouth daily as needed for anxiety.   cyclobenzaprine 10 MG tablet Commonly known as:  FLEXERIL Take  0.5-1 tablets (5-10 mg total) by mouth 3 (three) times daily as needed for muscle spasms.   gabapentin 300 MG capsule Commonly known as:  NEURONTIN Take 2 capsules (600 mg total) by mouth 2 (two) times daily.   HYDROcodone-acetaminophen 5-325 MG tablet Commonly known as:  NORCO/VICODIN Take 1-2 tablets by mouth every 4 (four) hours as needed (pain). What changed:  Another medication with the same name was added. Make sure you understand how and when to take  each.   HYDROcodone-acetaminophen 5-325 MG tablet Commonly known as:  NORCO/VICODIN Take 1-2 tablets by mouth every 4 (four) hours as needed (pain). What changed:  You were already taking a medication with the same name, and this prescription was added. Make sure you understand how and when to take each.   ibandronate 150 MG tablet Commonly known as:  BONIVA Take 150 mg by mouth every 30 (thirty) days. Take in the morning with a full glass of water, on an empty stomach, and do not take anything else by mouth or lie down for the next 30 min.   ibuprofen 200 MG tablet Commonly known as:  ADVIL,MOTRIN Take 800 mg by mouth every 8 (eight) hours as needed for mild pain.   multivitamin tablet Take 1 tablet by mouth daily.   oxyCODONE-acetaminophen 5-325 MG tablet Commonly known as:  PERCOCET/ROXICET Take 1-2 tablets by mouth every 4 (four) hours as needed (breakthrough pain).   predniSONE 5 MG tablet Commonly known as:  DELTASONE Take 5 mg by mouth every evening.   Tofacitinib Citrate 11 MG Tb24 Commonly known as:  XELJANZ XR Take 1 tablet by mouth daily. What changed:  how much to take   triamcinolone ointment 0.1 % Commonly known as:  KENALOG Apply 1 application topically 2 (two) times daily as needed (eczema).   VITAMIN C PO Take 1 tablet by mouth daily.   Vitamin D (Ergocalciferol) 50000 units Caps capsule Commonly known as:  DRISDOL Take 50,000 Units by mouth every 30 (thirty) days. Takes with Jaclyn Prime        SignedHosie Spangle 06/05/2016, 8:34 AM

## 2016-06-07 NOTE — Anesthesia Postprocedure Evaluation (Signed)
Anesthesia Post Note  Patient: Anne Garrett  Procedure(s) Performed: Procedure(s) (LRB): Revision of Lumbosacral Arthrodesis with revision of posterior instrumentation (N/A)     Patient location during evaluation: PACU Anesthesia Type: General Level of consciousness: awake and alert Pain management: pain level controlled Vital Signs Assessment: post-procedure vital signs reviewed and stable Respiratory status: spontaneous breathing, nonlabored ventilation, respiratory function stable and patient connected to nasal cannula oxygen Cardiovascular status: blood pressure returned to baseline and stable Postop Assessment: no signs of nausea or vomiting Anesthetic complications: no    Last Vitals:  Vitals:   06/05/16 0400 06/05/16 0732  BP: 92/68 (!) 100/58  Pulse: 73 71  Resp: 18 18  Temp: 37 C 37 C    Last Pain:  Vitals:   06/05/16 0732  TempSrc: Oral  PainSc:    Pain Goal: Patients Stated Pain Goal: 5 (06/05/16 0730)               Effie Berkshire

## 2016-06-14 ENCOUNTER — Other Ambulatory Visit: Payer: Self-pay | Admitting: *Deleted

## 2016-06-14 ENCOUNTER — Encounter: Payer: Self-pay | Admitting: *Deleted

## 2016-06-14 NOTE — Patient Outreach (Addendum)
[facility]) Care Management  06/14/2016  EMILEA GOGA Jul 19, 1975 [id number]   Subjective: Telephone call to patient's home / mobile number, spoke with patient, and HIPAA verified.  Discussed [facility] Care Management [facility] Transition of care follow up, patient voices understanding, and is in agreement to complete follow up.  States she remember speaking with this RNCM several times in the past.  Patient states she is doing much better, had a tough first week postop, has a follow up appointment scheduled with surgeon on 06/25/16, and scheduled to return to work on 06/28/16.   States she has her family medical leave act [employer]) in place and accessing her [facility] benefits as needed.  Patient states she does not have any transition of care, care coordination, disease management, disease monitoring, transportation, community resource, or pharmacy needs at this time. States she is very appreciative of the follow up and is in agreement to receive [place] Management information.    Objective: Per chart review, patient hospitalized 06/04/16 - 06/05/16 for L5-S1 pseudoarthrosis and  lumbar radiculopathy.  Status post Revision of right Lumbosacral Arthrodesis with revision of posterior instrumentation, with a right L4-S1 posterior lateral arthrodesis with nonsegmental radius posterior instrumentation, Vitoss BA, and infuse. Removal of existing right-sided posterior instrumentation on 06/04/16.  Patient also hospitalized 04/08/16 - 04/09/16 for Lumbar stenosis. Status post Bilateral L5-S1 lumbar decompression including laminectomy, facetectomy, and foraminotomies, for decompression of the central canal stenosis and decompression of the neural foraminal stenosis, with decompression of the exiting L5 and S1 nerve roots bilaterally, with decompression beyond that required for interbody arthrodesis; bilateral L5-S1 posterior lumbar interbody arthrodesis with AVS peek interbody implants, Vitoss BA with bone marrow  aspirate, and infuse; bilateral L5-S1 posterior lateral arthrodesis with nonsegmental radius posterior instrumentation, Vitoss BA with bone marrow aspirate, and infuse on 04/08/16. Patient also has a history of juvenile rheumatoid arthritis and migraines. Most recent [place] Management preoperative call completed on 05/28/16.  Most recent transition of care follow up completed on 04/13/16.    Assessment: Received [facility] Transition of care referral on 06/04/16 via [employer] report.  Transition of care follow up completed, no care management needs, and will proceed with case closure.   Plan: RNCM will send patient successful outreach letter, [facility] pamphlet, and magnet. RNCM will send case closure due to follow up completed / no care management needs request to [facility] [place] [facility].   Jeannie Mallinger H. Annia Friendly, BSN, [place] Management [facility] Telephonic CM Phone: [phone] Fax: [phone]

## 2016-06-23 MED FILL — HYDROCODON-APAP 5-325: 5-325 | 5 days supply | Qty: 60 | Fill #0

## 2016-06-23 MED FILL — XELJANZ XR 11 MG TB24: 11 | 30 days supply | Qty: 30 | Fill #1

## 2016-06-25 DIAGNOSIS — S32009K Unspecified fracture of unspecified lumbar vertebra, subsequent encounter for fracture with nonunion: Secondary | ICD-10-CM | POA: Diagnosis not present

## 2016-06-25 DIAGNOSIS — M4316 Spondylolisthesis, lumbar region: Secondary | ICD-10-CM | POA: Diagnosis not present

## 2016-06-25 DIAGNOSIS — Z981 Arthrodesis status: Secondary | ICD-10-CM | POA: Diagnosis not present

## 2016-06-25 DIAGNOSIS — M47816 Spondylosis without myelopathy or radiculopathy, lumbar region: Secondary | ICD-10-CM | POA: Diagnosis not present

## 2016-06-25 DIAGNOSIS — M5136 Other intervertebral disc degeneration, lumbar region: Secondary | ICD-10-CM | POA: Diagnosis not present

## 2016-06-29 DIAGNOSIS — M150 Primary generalized (osteo)arthritis: Secondary | ICD-10-CM | POA: Diagnosis not present

## 2016-06-29 DIAGNOSIS — Z681 Body mass index (BMI) 19 or less, adult: Secondary | ICD-10-CM | POA: Diagnosis not present

## 2016-06-29 DIAGNOSIS — Z79899 Other long term (current) drug therapy: Secondary | ICD-10-CM | POA: Diagnosis not present

## 2016-06-29 DIAGNOSIS — M545 Low back pain: Secondary | ICD-10-CM | POA: Diagnosis not present

## 2016-06-29 DIAGNOSIS — M2550 Pain in unspecified joint: Secondary | ICD-10-CM | POA: Diagnosis not present

## 2016-06-29 DIAGNOSIS — M0579 Rheumatoid arthritis with rheumatoid factor of multiple sites without organ or systems involvement: Secondary | ICD-10-CM | POA: Diagnosis not present

## 2016-07-06 MED FILL — GABAPENTIN 300 MG CAPSULE: 300 | 30 days supply | Qty: 120 | Fill #0

## 2016-07-08 MED FILL — IBANDRONATE NA 150 MG TAB: 150 | 90 days supply | Qty: 3 | Fill #0

## 2016-07-13 MED FILL — BUTALBITAL/APAP/CAFFEINE TB: 50-325-40 | 10 days supply | Qty: 60 | Fill #2

## 2016-07-16 DIAGNOSIS — M5136 Other intervertebral disc degeneration, lumbar region: Secondary | ICD-10-CM | POA: Diagnosis not present

## 2016-07-16 DIAGNOSIS — M5416 Radiculopathy, lumbar region: Secondary | ICD-10-CM | POA: Diagnosis not present

## 2016-07-16 DIAGNOSIS — Z981 Arthrodesis status: Secondary | ICD-10-CM | POA: Diagnosis not present

## 2016-07-16 DIAGNOSIS — M47816 Spondylosis without myelopathy or radiculopathy, lumbar region: Secondary | ICD-10-CM | POA: Diagnosis not present

## 2016-07-16 DIAGNOSIS — M4316 Spondylolisthesis, lumbar region: Secondary | ICD-10-CM | POA: Diagnosis not present

## 2016-07-23 DIAGNOSIS — M25551 Pain in right hip: Secondary | ICD-10-CM | POA: Diagnosis not present

## 2016-07-26 ENCOUNTER — Ambulatory Visit
Admission: EM | Admit: 2016-07-26 | Discharge: 2016-07-26 | Disposition: A | Discharge status: Home / Self Care | Payer: 59 | Attending: Family Medicine | Admitting: Family Medicine

## 2016-07-26 ENCOUNTER — Encounter: Payer: Self-pay | Admitting: Emergency Medicine

## 2016-07-26 DIAGNOSIS — R300 Dysuria: Secondary | ICD-10-CM

## 2016-07-26 DIAGNOSIS — N3001 Acute cystitis with hematuria: Secondary | ICD-10-CM | POA: Diagnosis not present

## 2016-07-26 LAB — URINALYSIS, COMPLETE (UACMP) WITH MICROSCOPIC
Bacteria, UA: NONE SEEN
Bilirubin Urine: NEGATIVE
Glucose, UA: NEGATIVE mg/dL
Ketones, ur: NEGATIVE mg/dL
Leukocytes, UA: NEGATIVE
Nitrite: NEGATIVE
Protein, ur: 30 mg/dL — AB
Specific Gravity, Urine: 1.015 (ref 1.005–1.030)
pH: 7 (ref 5.0–8.0)

## 2016-07-26 MED ORDER — NITROFURANTOIN MONOHYD MACRO 100 MG PO CAPS: 100.0000 mg | ORAL_CAPSULE | Freq: Two times a day (BID) | ORAL | 0 refills | Status: DC

## 2016-07-26 MED FILL — NITROFURANTOIN MONO-MCR 100: 100 | 5 days supply | Qty: 10 | Fill #0

## 2016-07-26 NOTE — ED Triage Notes (Signed)
Patient c/o burning when urinating and increase in urinary urgency since yesterday.

## 2016-07-26 NOTE — ED Provider Notes (Signed)
CSN: [id number]     Arrival date & time 07/26/16  7408 History   None    Chief Complaint  Patient presents with  . Dysuria   (Consider location/radiation/quality/duration/timing/severity/associated sxs/prior Treatment) 41 yo F presents with 24 hours of dysuria- and new onset hematuria this morning Denies pelvic trauma, or irritation- no discharge or drainage No pelvic pain except dysuria BC by Essure fallopian tube plugs Not sexually active over weekend Tends not to drink "enough" and commonly sees concentrated urine color Denies NVD, back pain or fever Reports allergy to Sulfa  Smokes 2-3 cigarettes a day x many years      Past Medical History:  Diagnosis Date  . Adrenal insufficiency ([place])    secondary to Prednisone  . Eczema    coner oif eye lid  . Headache    Migraine  . Heart murmur    only hear once slight  . Intervertebral disk disease   . Osteoporosis   . Rheumatoid arthritis ([place])    Juvenile onset  . Sleep apnea    questionable  uses dental device at night to help with snoring   Past Surgical History:  Procedure Laterality Date  . ANKLE ARTHROPLASTY Right   . ANTERIOR CERVICAL DECOMP/DISCECTOMY FUSION N/A 06/19/2015   Procedure: C3-4 C4-5 Anterior cervical decompression/diskectomy/fusion;  Surgeon: Jovita Gamma, MD;  Location: [place] [facility];  Service: Neurosurgery;  Laterality: N/A;  C3-4 C4-5 Anterior cervical decompression/diskectomy/fusion  . APPENDECTOMY    . CARPAL TUNNEL RELEASE Right   . CESAREAN SECTION    . CHOLECYSTECTOMY    . COLONOSCOPY    . ESSURE TUBAL LIGATION     Essure Implant-Permanent birth control  . excision to palm Right    excision of mass to right plam  . JOINT REPLACEMENT Bilateral    bilat hip replacements  . ORIF FEMUR FRACTURE Right   . plif l5-s1 arthrodeisis    2018  . POSTERIOR CERVICAL FUSION/FORAMINOTOMY N/A 07/12/2015   Procedure: C2 to C5 Cervical laminectomy, C2 to C5 posterior cervical arthrodesis with  instrumentation and bone graft;  Surgeon: Jovita Gamma, MD;  Location: [place] [facility];  Service: Neurosurgery;  Laterality: N/A;  C2 to C5 Cervical laminectomy, C2  to C5 posterior cervical arthrodesis with instrumentation and bone graft  . right foot surgery Right    right jfoot arthrodesis and right ankle arthroplasty  . WRIST SURGERY Bilateral    arthrodesis   Family History  Problem Relation Age of Onset  . Breast cancer Mother 61  . Nephritis Father   . Epilepsy Father   . Breast cancer Maternal Aunt        2 mat aunts   . Multiple sclerosis Maternal Uncle   . Psoriasis Paternal Aunt    Social History  Substance Use Topics  . Smoking status: Former Smoker    Packs/day: 0.25    Years: 20.00    Types: Cigarettes    Quit date: 07/02/2015  . Smokeless tobacco: Never Used  . Alcohol use 1.2 oz/week    2 Glasses of wine per week     Comment: social   OB History    No data available     Review of Systems  Constitutional: Negative for activity change.  HENT: Negative.   Eyes: Negative.   Respiratory: Negative.   Cardiovascular: Negative.   Gastrointestinal: Negative for abdominal distention and abdominal pain.  Endocrine: Negative.   Genitourinary: Positive for frequency and hematuria.  Musculoskeletal: Negative for back pain.  Neurological:  Negative.   Hematological: Negative.     Allergies  Sulfa antibiotics; Teriparatide; Valproic acid and related; Celexa [citalopram]; Leflunomide; Methotrexate; Paroxetine hcl; and Zoloft [sertraline]  Home Medications   Prior to Admission medications   Medication Sig Start Date End Date Taking? Authorizing Provider  Ascorbic Acid (VITAMIN C PO) Take 1 tablet by mouth daily.    [provider]  busPIRone (BUSPAR) 5 MG tablet Take 5 mg by mouth daily.     [provider]  butalbital-acetaminophen-caffeine (FIORICET, ESGIC) 50-325-40 MG tablet Take 1 tablet by mouth 2 (two) times daily as needed for headache.      [provider]  Calcium Carb-Cholecalciferol (CALCIUM 500+D) 500-200 MG-UNIT TABS Take 1 tablet by mouth.    [provider]  clonazePAM (KLONOPIN) 1 MG tablet Take 0.5 mg by mouth daily as needed for anxiety.    [provider]  gabapentin (NEURONTIN) 300 MG capsule Take 2 capsules (600 mg total) by mouth 2 (two) times daily. 06/05/16   Jovita Gamma, MD  ibandronate (BONIVA) 150 MG tablet Take 150 mg by mouth every 30 (thirty) days. Take in the morning with a full glass of water, on an empty stomach, and do not take anything else by mouth or lie down for the next 30 min.    [provider]  ibuprofen (ADVIL,MOTRIN) 200 MG tablet Take 800 mg by mouth every 8 (eight) hours as needed for mild pain.     [provider]  Multiple Vitamin (MULTIVITAMIN) tablet Take 1 tablet by mouth daily.    [provider]  nitrofurantoin, macrocrystal-monohydrate, (MACROBID) 100 MG capsule Take 1 capsule (100 mg total) by mouth 2 (two) times daily. 07/26/16   Jan Fireman, PA-C  predniSONE (DELTASONE) 5 MG tablet Take 5 mg by mouth every evening.     [provider]  Tofacitinib Citrate (XELJANZ XR) 11 MG TB24 Take 1 tablet by mouth daily. Patient not taking: Reported on 06/14/2016 05/17/16   Tresa Garter, MD  triamcinolone ointment (KENALOG) 0.1 % Apply 1 application topically 2 (two) times daily as needed (eczema).    [provider]  Vitamin D, Ergocalciferol, (DRISDOL) 50000 units CAPS capsule Take 50,000 Units by mouth every 30 (thirty) days. Takes with Boniva    [provider]   Meds Ordered and Administered this Visit  Medications - No data to display  BP 111/78 (BP Location: Left Arm)   Pulse 88   Temp 99.5 F (37.5 C) (Oral)   Resp 16   Ht 5' (1.524 m)   Wt 95 lb (43.1 kg)   LMP 07/18/2016 (Exact Date)   SpO2 100%   BMI 18.55 kg/m  No data found.   Physical Exam  Constitutional: She is oriented to person,  place, and time. She appears well-developed and well-nourished.  HENT:  Head: Normocephalic.  Eyes: Pupils are equal, round, and reactive to light. EOM are normal.  Neck:  S/p ant and post fusion,decreased ROM - denies recent change  Cardiovascular: Normal rate and regular rhythm.   Pulmonary/Chest: Effort normal and breath sounds normal.  Abdominal: Soft. There is no tenderness. There is no guarding.  Musculoskeletal:  Anterior and posterior cervical fusions, ROM limited;  Lumbar stenosis  Neurological: She is alert and oriented to person, place, and time.  Skin: Skin is warm and dry.  Psychiatric: She has a normal mood and affect.   Urine today with TNTC RBCs, no bacteria - discussed with patient, will initiate atb  Rx -  she will increase po fluids and CB in 3 days for culture results Complete Rx pending that report...   Urgent Care Course     Procedures (including critical care time)  Labs Review Labs Reviewed  URINALYSIS, COMPLETE (UACMP) WITH MICROSCOPIC - Abnormal; Notable for the following:       Result Value   Color, Urine PINK (*)    APPearance CLOUDY (*)    Hgb urine dipstick LARGE (*)    Protein, ur 30 (*)    Squamous Epithelial / LPF 0-5 (*)    All other components within normal limits  URINE CULTURE        MDM   Plan: Test results and diagnosis reviewed with patient- intervention while culture pending Increase po clear liquids, not carbonated preferred- Blueberry /cranberry juice added prn with water dilute Rx as per orders;  benefits, risks, potential side effects reviewed   Recommend supportive treatment with cyclic tylenol and ibuprofen- may use AZO if desired Seek additional medical care if symptoms worsen or are not improving  Dx:  Acute cystitis         Dysuria    Jan Fireman, PA-C 07/26/16 1652

## 2016-07-26 NOTE — Discharge Instructions (Signed)
Increase PO fluids , start antibiotics for 3 days- call for culture report- handouts given--encourage increased hydration routinely

## 2016-07-27 LAB — URINE CULTURE: Culture: NO GROWTH

## 2016-08-03 MED FILL — BUTALBITAL/APAP/CAFFEINE TB: 50-325-40 | 5 days supply | Qty: 30 | Fill #0

## 2016-08-05 MED FILL — GABAPENTIN 300 MG CAPS: 300 | 30 days supply | Qty: 120 | Fill #1

## 2016-08-10 MED FILL — predniSONE 5 MG TABS: 5 | 90 days supply | Qty: 90 | Fill #0

## 2016-08-12 MED FILL — XELJANZ XR 11 MG TB24: 11 | 30 days supply | Qty: 30 | Fill #2

## 2016-08-12 MED FILL — BUTALB-ACETAMIN-CAFF 50-325: 50-325-40 | 5 days supply | Qty: 30 | Fill #1

## 2016-08-24 DIAGNOSIS — Z981 Arthrodesis status: Secondary | ICD-10-CM | POA: Diagnosis not present

## 2016-08-24 DIAGNOSIS — M5136 Other intervertebral disc degeneration, lumbar region: Secondary | ICD-10-CM | POA: Diagnosis not present

## 2016-08-24 DIAGNOSIS — M47816 Spondylosis without myelopathy or radiculopathy, lumbar region: Secondary | ICD-10-CM | POA: Diagnosis not present

## 2016-08-24 DIAGNOSIS — M4316 Spondylolisthesis, lumbar region: Secondary | ICD-10-CM | POA: Diagnosis not present

## 2016-08-24 DIAGNOSIS — M5416 Radiculopathy, lumbar region: Secondary | ICD-10-CM | POA: Diagnosis not present

## 2016-08-24 MED FILL — BUTALB-ACETAMIN-CAFF 50-325: 50-325-40 | 5 days supply | Qty: 30 | Fill #2

## 2016-09-07 MED FILL — XELJANZ XR 11 MG TB24: 11 | 30 days supply | Qty: 30 | Fill #3

## 2016-09-07 MED FILL — GABAPENTIN 300 MG CAPSULE: 300 | 30 days supply | Qty: 120 | Fill #2

## 2016-09-07 MED FILL — BUTALB-ACETAMIN-CAFF 50-325: 50-325-40 | 5 days supply | Qty: 30 | Fill #0

## 2016-09-20 MED FILL — BUTALB-ACETAMIN-CAFF 50-325: 50-325-40 | 5 days supply | Qty: 30 | Fill #1

## 2016-09-22 ENCOUNTER — Ambulatory Visit: Payer: 59 | Admitting: Physical Therapy

## 2016-09-24 DIAGNOSIS — Z124 Encounter for screening for malignant neoplasm of cervix: Secondary | ICD-10-CM | POA: Diagnosis not present

## 2016-09-24 DIAGNOSIS — Z01419 Encounter for gynecological examination (general) (routine) without abnormal findings: Secondary | ICD-10-CM | POA: Diagnosis not present

## 2016-09-24 DIAGNOSIS — R87612 Low grade squamous intraepithelial lesion on cytologic smear of cervix (LGSIL): Secondary | ICD-10-CM | POA: Diagnosis not present

## 2016-09-29 MED FILL — BUTALB-ACETAMIN-CAFF 50-325: 50-325-40 | 5 days supply | Qty: 30 | Fill #2

## 2016-09-30 ENCOUNTER — Encounter: Payer: Self-pay | Admitting: Physical Therapy

## 2016-09-30 ENCOUNTER — Ambulatory Visit: Payer: 59 | Attending: Family Medicine | Admitting: Physical Therapy

## 2016-09-30 DIAGNOSIS — M545 Low back pain: Secondary | ICD-10-CM | POA: Diagnosis not present

## 2016-09-30 DIAGNOSIS — M6281 Muscle weakness (generalized): Secondary | ICD-10-CM | POA: Insufficient documentation

## 2016-09-30 DIAGNOSIS — G8929 Other chronic pain: Secondary | ICD-10-CM | POA: Diagnosis not present

## 2016-09-30 DIAGNOSIS — R2689 Other abnormalities of gait and mobility: Secondary | ICD-10-CM | POA: Insufficient documentation

## 2016-09-30 DIAGNOSIS — R293 Abnormal posture: Secondary | ICD-10-CM | POA: Insufficient documentation

## 2016-09-30 NOTE — Therapy (Signed)
[place], [place], [address] Phone: [phone]   Fax:  [phone]  Physical Therapy Evaluation  Patient Details  Name: Anne Garrett MRN: [id number] Date of Birth: November 16, 1975 Referring Provider: Ovidio Kin MD  Encounter Date: 09/30/2016      PT End of Session - 09/30/16 0955    Visit Number 1   Number of Visits 13   Date for PT Re-Evaluation 11/25/16   Authorization Type [place] [facility]   PT Start Time 0848   PT Stop Time 0930   PT Time Calculation (min) 42 min   Activity Tolerance Patient tolerated treatment well   Behavior During Therapy [facility] for tasks assessed/performed      Past Medical History:  Diagnosis Date  . Adrenal insufficiency ([place])    secondary to Prednisone  . Eczema    coner oif eye lid  . Headache    Migraine  . Heart murmur    only hear once slight  . Intervertebral disk disease   . Osteoporosis   . Rheumatoid arthritis ([place])    Juvenile onset  . Sleep apnea    questionable  uses dental device at night to help with snoring    Past Surgical History:  Procedure Laterality Date  . ANKLE ARTHROPLASTY Right   . ANTERIOR CERVICAL DECOMP/DISCECTOMY FUSION N/A 06/19/2015   Procedure: C3-4 C4-5 Anterior cervical decompression/diskectomy/fusion;  Surgeon: Jovita Gamma, MD;  Location: [place] [facility];  Service: Neurosurgery;  Laterality: N/A;  C3-4 C4-5 Anterior cervical decompression/diskectomy/fusion  . APPENDECTOMY    . CARPAL TUNNEL RELEASE Right   . CESAREAN SECTION    . CHOLECYSTECTOMY    . COLONOSCOPY    . ESSURE TUBAL LIGATION     Essure Implant-Permanent birth control  . excision to palm Right    excision of mass to right plam  . JOINT REPLACEMENT Bilateral    bilat hip replacements  . ORIF FEMUR FRACTURE Right   . OTHER SURGICAL HISTORY  06/04/2016   Revision of PLIF  . plif l5-s1 arthrodeisis    2018  . POSTERIOR CERVICAL FUSION/FORAMINOTOMY N/A 07/12/2015    Procedure: C2 to C5 Cervical laminectomy, C2 to C5 posterior cervical arthrodesis with instrumentation and bone graft;  Surgeon: Jovita Gamma, MD;  Location: [place] [facility];  Service: Neurosurgery;  Laterality: N/A;  C2 to C5 Cervical laminectomy, C2  to C5 posterior cervical arthrodesis with instrumentation and bone graft  . right foot surgery Right    right jfoot arthrodesis and right ankle arthroplasty  . WRIST SURGERY Bilateral    arthrodesis    There were no vitals filed for this visit.       Subjective Assessment - 09/30/16 0900    Subjective Pt is  41 y.o F with CC of low back and RLE referral pain following a PLIF revision in June 2018. pt has a hx of mulitple low back surgeries with the most recent revsion in June of 2018. reports mostly N/T i nth eback of the tight, and tightness with shooting pain in the leg. Reports some improvement since onset but has stayed mostly the same with intermittent fluctuation depending on activity.  denies any red flags.    Pertinent History hx of multiple low back surgies and hx of RA   Limitations Standing;Walking;Sitting   How long can you sit comfortably? depends on surface 5-10 min    How long can you stand comfortably? 45 min   How long can you walk  comfortably? 45 min   Diagnostic tests nothing since last surgery on 06/04/2016   Patient Stated Goals Make the pain go away, return to walking normally    Currently in Pain? Yes   Pain Score 6   took meds this AM, at worst 8/10   Pain Location Back   Pain Orientation Right   Pain Descriptors / Indicators Pins and needles;Numbness;Shooting   Pain Type Chronic pain   Pain Radiating Towards to the RLE calf / foot   Pain Onset More than a month ago   Pain Frequency Intermittent   Aggravating Factors  prolonged sitting on soft surface, prlonged standing / walking,    Pain Relieving Factors medication, shifting the weight, changing position            [facility] PT Assessment - 09/30/16 0906       Assessment   Medical Diagnosis lumbar spondylosis    Referring Provider Ovidio Kin MD   Onset Date/Surgical Date --  06/04/2016   Hand Dominance Right   Next MD Visit --  November 2018   Prior Therapy yes     Precautions   Precautions --   Precaution Comments no lifting over 5#, no forward bending, wear back brace  hx of RA     Restrictions   Weight Bearing Restrictions No     Balance Screen   Has the patient fallen in the past 6 months No   Has the patient had a decrease in activity level because of a fear of falling?  No   Is the patient reluctant to leave their home because of a fear of falling?  No     Home Social worker Private residence   Living Arrangements Children   Available Help at Discharge Available PRN/intermittently   Type of [place] Access Level entry   Home Layout One level     Prior Function   Level of Independence Independent with basic ADLs   Vocation Full time employment  Glass blower/designer at [employer] Requirements prolonged sitting, standing,    Leisure hanging with family     Cognition   Overall Cognitive Status Within Functional Limits for tasks assessed     Observation/Other Assessments   Focus on Therapeutic Outcomes (FOTO)  42% limited  predicted 38% limited      Posture/Postural Control   Posture/Postural Control Postural limitations   Postural Limitations Rounded Shoulders;Forward head;Right pelvic obliquity;Weight shift left     ROM / Strength   AROM / PROM / Strength AROM;Strength;PROM     AROM   AROM Assessment Site Lumbar   Lumbar Flexion 110   Lumbar Extension 10   Lumbar - Right Side Bend 10   Lumbar - Left Side Bend 10     PROM   PROM Assessment Site Hip   Right/Left Hip Right;Left     Strength   Strength Assessment Site Hip;Knee   Right/Left Hip Right;Left   Right Hip Flexion 4+/5   Right Hip Extension 4-/5   Right Hip External Rotation  4-/5   Right  Hip Internal Rotation 4-/5   Right Hip ABduction 3+/5   Right Hip ADduction 3+/5   Left Hip Flexion 4+/5   Left Hip Extension 4-/5   Left Hip External Rotation 4-/5   Left Hip Internal Rotation 4-/5   Left Hip ABduction 3+/5   Left Hip ADduction 3+/5   Right/Left Knee Right;Left   Right Knee Flexion 4+/5  Right Knee Extension 4+/5   Left Knee Flexion 4+/5   Left Knee Extension 4+/5     Palpation   Palpation comment tightness the glute      Special Tests    Special Tests Leg LengthTest   Leg length test  True;Apparent     True   Right 85.5 in.   Left  83.3 in.            Objective measurements completed on examination: See above findings.          [facility] Adult PT Treatment/Exercise - 09/30/16 0906      Self-Care   Self-Care Other Self-Care Comments   Other Self-Care Comments  benefits of heel lift to reduce LLD of the LLE being shorter     Lumbar Exercises: Stretches   Active Hamstring Stretch 1 rep;30 seconds   Lower Trunk Rotation 2 reps;30 seconds   Hip Flexor Stretch 1 rep;30 seconds   Piriformis Stretch 2 reps;30 seconds     Lumbar Exercises: Supine   Clam 10 reps  x 2 sets with red theraband                PT Education - 09/30/16 0955    Education provided Yes   Education Details evaluation findings, POC, goals, HEP with proper form, area involved.    Person(s) Educated Patient   Methods Explanation;Verbal cues;Handout   Comprehension Verbalized understanding;Verbal cues required          PT Short Term Goals - 09/30/16 1006      PT SHORT TERM GOAL #1   Title pt to be I with inital HEP   Time 4   Period Weeks   Status New   Target Date 10/28/16     PT SHORT TERM GOAL #2   Title pt to verbalize and demo proper posture with sitting/ standing and lifting mechanics to prevent and reduce low back pain   Time 4   Period Weeks   Status New   Target Date 10/28/16     PT SHORT TERM GOAL #3   Title Reduce muscle tightness in the  hip flexor/ piriformis to reduce N/T and pain to </= 4/10 to promote function   Time 4   Period Weeks   Status New   Target Date 10/28/16           PT Long Term Goals - 09/30/16 1008      PT LONG TERM GOAL #1   Title pt to report no N/T in the RLE for over 1 week to demonstrate improvement in function   Time 8   Period Weeks   Status New   Target Date 11/25/16     PT LONG TERM GOAL #2   Title pt to increase RLE strength to >/= 4+/5 to promote hip stability with walking/ standing and maintain proper posture with lifting/ carrying activities.    Time 8   Period Weeks   Status New   Target Date 11/25/16     PT LONG TERM GOAL #3   Title pt to be able to sit , stand and walk for >/= 45 min with </= 1/10 pain and no report of N/T for functional endurance required for work related activities.    Time 8   Period Weeks   Status New   Target Date 11/25/16     PT LONG TERM GOAL #4   Title Increase FOTO score to </= 38% limited to demo improvement in function  Time 8   Period Weeks   Status New   Target Date 11/25/16     PT LONG TERM GOAL #5   Title pt will be I with all HEP given as of last visit    Time 8   Period Weeks   Status New   Target Date 11/25/16                Plan - 09/30/16 0956    Clinical Impression Statement pt presents to OPPT with CC of low back pain and referral of N/T down the R leg. pt has hx of mulitple low back surgeries with PLIF revision in 06/04/2016. She demonstrates limited trunk side bending and extension and has L trunk lean with standing / walking. Weakness noted in bil LE with bil hip abductor/ glute weakness. TTP along R piriformis and at the L PSIS with reproduction of RLE sx with palpation. LLD noted with LLE being shorter, provided  a heel lift but pt may required more of a lift, but she rpeorted improvement of pain with walking/ standing. She would benefit from physical therapy to decrease low back tightness, RLE referral pain,  promote proper posture and maximize her function by addressing the deficts listed.    History and Personal Factors relevant to plan of care: hx of multiple low back surgies, hx or RA, osteoporosis   Clinical Presentation Evolving   Clinical Presentation due to: abnormal trunk position. LLD, weakness   Clinical Decision Making Moderate   Rehab Potential Good   Clinical Impairments Affecting Rehab Potential RA, osteoporosis   PT Frequency 2x / week   PT Duration 8 weeks   PT Treatment/Interventions ADLs/Self Care Home Management;Cryotherapy;Electrical Stimulation;Iontophoresis 4mg /ml Dexamethasone;Moist Heat;Ultrasound;Therapeutic activities;Therapeutic exercise;Dry needling;Manual techniques;Taping;Patient/family education;Gait training;Stair training;Passive range of motion;Neuromuscular re-education;Balance training   PT Next Visit Plan review / update HEP PRN, posture, piriformis stretching/ hamstring stretching, core strengthening, how was heel lift,    PT Home Exercise Plan piriformis stretching, hamstring stretching, lower trunk rotation, clams, hip flexor stretching   Consulted and Agree with Plan of Care Patient      Patient will benefit from skilled therapeutic intervention in order to improve the following deficits and impairments:  Pain, Improper body mechanics, Postural dysfunction, Decreased range of motion, Decreased endurance, Decreased activity tolerance, Decreased strength, Increased fascial restricitons, Increased muscle spasms, Decreased balance, Impaired flexibility, Other (comment) (LLD)  Visit Diagnosis: Chronic right-sided low back pain, with sciatica presence unspecified - Plan: PT plan of care cert/re-cert  Muscle weakness (generalized) - Plan: PT plan of care cert/re-cert  Abnormal posture - Plan: PT plan of care cert/re-cert  Other abnormalities of gait and mobility - Plan: PT plan of care cert/re-cert      G-Codes - 67/59/16 1014    Functional Assessment Tool  Used (Outpatient Only) ROM, LLD, pain/ N&T, FOTO / clinical judgement   Functional Limitation Changing and maintaining body position   Changing and Maintaining Body Position Current Status (B8466) At least 40 percent but less than 60 percent impaired, limited or restricted   Changing and Maintaining Body Position Goal Status (Z9935) At least 20 percent but less than 40 percent impaired, limited or restricted       Problem List Patient Active Problem List   Diagnosis Date Noted  . Lumbar pseudoarthrosis 06/04/2016  . Lumbar stenosis 04/08/2016  . S/P cervical spinal fusion 07/13/2015  . Cervical stenosis of spinal canal 07/12/2015  . HNP (herniated nucleus pulposus), cervical 06/19/2015  . Numbness 05/20/2015  .  Chronic rheumatic arthritis ([place]) 05/20/2015  . Carpal tunnel syndrome 05/20/2015  . Snoring 05/20/2015  . Excessive daytime sleepiness 05/20/2015   Starr Lake PT, DPT, LAT, ATC  09/30/16  10:17 AM      [facility] [address] [place], [place], [address] Phone: [phone]   Fax:  [phone]  Name: Anne Garrett MRN: [id number] Date of Birth: 27-Jun-1975

## 2016-10-05 DIAGNOSIS — Z79899 Other long term (current) drug therapy: Secondary | ICD-10-CM | POA: Diagnosis not present

## 2016-10-05 DIAGNOSIS — M2550 Pain in unspecified joint: Secondary | ICD-10-CM | POA: Diagnosis not present

## 2016-10-05 DIAGNOSIS — M150 Primary generalized (osteo)arthritis: Secondary | ICD-10-CM | POA: Diagnosis not present

## 2016-10-05 DIAGNOSIS — M0579 Rheumatoid arthritis with rheumatoid factor of multiple sites without organ or systems involvement: Secondary | ICD-10-CM | POA: Diagnosis not present

## 2016-10-05 DIAGNOSIS — M810 Age-related osteoporosis without current pathological fracture: Secondary | ICD-10-CM | POA: Diagnosis not present

## 2016-10-05 DIAGNOSIS — Z681 Body mass index (BMI) 19 or less, adult: Secondary | ICD-10-CM | POA: Diagnosis not present

## 2016-10-06 ENCOUNTER — Other Ambulatory Visit: Payer: Self-pay | Admitting: Pharmacist

## 2016-10-06 DIAGNOSIS — R87612 Low grade squamous intraepithelial lesion on cytologic smear of cervix (LGSIL): Secondary | ICD-10-CM | POA: Diagnosis not present

## 2016-10-06 DIAGNOSIS — R87610 Atypical squamous cells of undetermined significance on cytologic smear of cervix (ASC-US): Secondary | ICD-10-CM | POA: Diagnosis not present

## 2016-10-06 MED ORDER — TOFACITINIB CITRATE 5 MG PO TABS: ORAL_TABLET | ORAL | 3 refills | Status: DC

## 2016-10-11 MED FILL — GABAPENTIN 300 MG CAPSULE: 300 | 30 days supply | Qty: 120 | Fill #3

## 2016-10-12 MED FILL — predniSONE 5 MG TABS: 5 | 12 days supply | Qty: 48 | Fill #0

## 2016-10-14 MED FILL — BUTALB-ACETAMIN-CAFF 50-325: 50-325-40 | 5 days supply | Qty: 30 | Fill #0

## 2016-10-15 ENCOUNTER — Encounter: Payer: Self-pay | Admitting: Physical Therapy

## 2016-10-15 ENCOUNTER — Ambulatory Visit: Payer: 59 | Attending: Family Medicine | Admitting: Physical Therapy

## 2016-10-15 DIAGNOSIS — M545 Low back pain: Secondary | ICD-10-CM | POA: Insufficient documentation

## 2016-10-15 DIAGNOSIS — R2689 Other abnormalities of gait and mobility: Secondary | ICD-10-CM | POA: Insufficient documentation

## 2016-10-15 DIAGNOSIS — R293 Abnormal posture: Secondary | ICD-10-CM | POA: Diagnosis not present

## 2016-10-15 DIAGNOSIS — G8929 Other chronic pain: Secondary | ICD-10-CM | POA: Diagnosis not present

## 2016-10-15 DIAGNOSIS — M6281 Muscle weakness (generalized): Secondary | ICD-10-CM | POA: Diagnosis not present

## 2016-10-15 MED FILL — IBANDRONATE NA 150 MG TAB: 150 | 90 days supply | Qty: 3 | Fill #1

## 2016-10-15 MED FILL — XELJANZ 5 MG TABS: 5 | 30 days supply | Qty: 60 | Fill #0

## 2016-10-15 NOTE — Therapy (Signed)
[place], [place], [address] Phone: [phone]   Fax:  [phone]  Physical Therapy Treatment  Patient Details  Name: Anne Garrett MRN: [id number] Date of Birth: 1975/07/16 Referring Provider: Ovidio Kin MD  Encounter Date: 10/15/2016      PT End of Session - 10/15/16 0902    Visit Number 2   Number of Visits 13   Date for PT Re-Evaluation 11/25/16   PT Start Time 0801   PT Stop Time 0848   PT Time Calculation (min) 47 min   Activity Tolerance Patient tolerated treatment well   Behavior During Therapy [facility] for tasks assessed/performed      Past Medical History:  Diagnosis Date  . Adrenal insufficiency ([place])    secondary to Prednisone  . Eczema    coner oif eye lid  . Headache    Migraine  . Heart murmur    only hear once slight  . Intervertebral disk disease   . Osteoporosis   . Rheumatoid arthritis ([place])    Juvenile onset  . Sleep apnea    questionable  uses dental device at night to help with snoring    Past Surgical History:  Procedure Laterality Date  . ANKLE ARTHROPLASTY Right   . ANTERIOR CERVICAL DECOMP/DISCECTOMY FUSION N/A 06/19/2015   Procedure: C3-4 C4-5 Anterior cervical decompression/diskectomy/fusion;  Surgeon: Jovita Gamma, MD;  Location: [place] [facility];  Service: Neurosurgery;  Laterality: N/A;  C3-4 C4-5 Anterior cervical decompression/diskectomy/fusion  . APPENDECTOMY    . CARPAL TUNNEL RELEASE Right   . CESAREAN SECTION    . CHOLECYSTECTOMY    . COLONOSCOPY    . ESSURE TUBAL LIGATION     Essure Implant-Permanent birth control  . excision to palm Right    excision of mass to right plam  . JOINT REPLACEMENT Bilateral    bilat hip replacements  . ORIF FEMUR FRACTURE Right   . OTHER SURGICAL HISTORY  06/04/2016   Revision of PLIF  . plif l5-s1 arthrodeisis    2018  . POSTERIOR CERVICAL FUSION/FORAMINOTOMY N/A 07/12/2015   Procedure: C2 to C5 Cervical  laminectomy, C2 to C5 posterior cervical arthrodesis with instrumentation and bone graft;  Surgeon: Jovita Gamma, MD;  Location: [place] [facility];  Service: Neurosurgery;  Laterality: N/A;  C2 to C5 Cervical laminectomy, C2  to C5 posterior cervical arthrodesis with instrumentation and bone graft  . right foot surgery Right    right jfoot arthrodesis and right ankle arthroplasty  . WRIST SURGERY Bilateral    arthrodesis    There were no vitals filed for this visit.      Subjective Assessment - 10/15/16 0759    Subjective "The Lift has really helped, about 70% improvement. I am sleeping better and I half gabapentin"   Currently in Pain? Yes   Pain Score 2    Pain Location Back   Pain Orientation Right   Pain Type Chronic pain   Pain Frequency Intermittent   Aggravating Factors  standing,                          [facility] Adult PT Treatment/Exercise - 10/15/16 0815      Lumbar Exercises: Stretches   Hip Flexor Stretch 2 reps;30 seconds  with glute set   Piriformis Stretch 2 reps;30 seconds  QL on the R side in  L sidelying      Lumbar Exercises: Supine   Glut Set 10  reps;5 seconds  x 2 sets    Glut Set Limitations reported improvement of hip mobility     Lumbar Exercises: Sidelying   Hip Abduction 10 reps  with controlled eccentric lowering x 2 sets     Manual Therapy   Manual Therapy Soft tissue mobilization   Soft tissue mobilization IASTM over the glute med/ piriformis/ and QL          Trigger Point Dry Needling - 10/15/16 3557    Consent Given? Yes   Education Handout Provided Yes   Muscles Treated Upper Body --  QL on the R    Muscles Treated Lower Body Piriformis;Gluteus minimus   Gluteus Minimus Response Twitch response elicited;Palpable increased muscle length  glute med   Piriformis Response Twitch response elicited;Palpable increased muscle length              PT Education - 10/15/16 0817    Education provided Yes   Education  Details muscle anatomy and referral patterns. what TPDN is, benefits, what to expect and after care, updated HEP.  before and concurrent to tx   Person(s) Educated Patient   Methods Explanation;Verbal cues   Comprehension Verbalized understanding;Verbal cues required          PT Short Term Goals - 09/30/16 1006      PT SHORT TERM GOAL #1   Title pt to be I with inital HEP   Time 4   Period Weeks   Status New   Target Date 10/28/16     PT SHORT TERM GOAL #2   Title pt to verbalize and demo proper posture with sitting/ standing and lifting mechanics to prevent and reduce low back pain   Time 4   Period Weeks   Status New   Target Date 10/28/16     PT SHORT TERM GOAL #3   Title Reduce muscle tightness in the hip flexor/ piriformis to reduce N/T and pain to </= 4/10 to promote function   Time 4   Period Weeks   Status New   Target Date 10/28/16           PT Long Term Goals - 09/30/16 1008      PT LONG TERM GOAL #1   Title pt to report no N/T in the RLE for over 1 week to demonstrate improvement in function   Time 8   Period Weeks   Status New   Target Date 11/25/16     PT LONG TERM GOAL #2   Title pt to increase RLE strength to >/= 4+/5 to promote hip stability with walking/ standing and maintain proper posture with lifting/ carrying activities.    Time 8   Period Weeks   Status New   Target Date 11/25/16     PT LONG TERM GOAL #3   Title pt to be able to sit , stand and walk for >/= 45 min with </= 1/10 pain and no report of N/T for functional endurance required for work related activities.    Time 8   Period Weeks   Status New   Target Date 11/25/16     PT LONG TERM GOAL #4   Title Increase FOTO score to </= 38% limited to demo improvement in function   Time 8   Period Weeks   Status New   Target Date 11/25/16     PT LONG TERM GOAL #5   Title pt will be I with all HEP given as of last visit  Time 8   Period Weeks   Status New   Target Date  11/25/16               Plan - 10/15/16 5170    Clinical Impression Statement pt reports significant improvement with PT since previous session and that the heel lift has really helped. Educated and performed DN for the piriformis, glute med and QL followed with IASTM techniques.  following strengthening of the hip and stretching she reported relief of pain and tightness. she declined modalities post session stating she felt better.    PT Treatment/Interventions ADLs/Self Care Home Management;Cryotherapy;Electrical Stimulation;Iontophoresis 4mg /ml Dexamethasone;Moist Heat;Ultrasound;Therapeutic activities;Therapeutic exercise;Dry needling;Manual techniques;Taping;Patient/family education;Gait training;Stair training;Passive range of motion;Neuromuscular re-education;Balance training   PT Next Visit Plan update HEP PRN, response to DN,  posture, piriformis stretching/ hamstring stretching, core strengthening,    PT Home Exercise Plan piriformis stretching, hamstring stretching, lower trunk rotation, clams, hip flexor stretching   Consulted and Agree with Plan of Care Patient      Patient will benefit from skilled therapeutic intervention in order to improve the following deficits and impairments:  Pain, Improper body mechanics, Postural dysfunction, Decreased range of motion, Decreased endurance, Decreased activity tolerance, Decreased strength, Increased fascial restricitons, Increased muscle spasms, Decreased balance, Impaired flexibility, Other (comment)  Visit Diagnosis: Chronic right-sided low back pain, with sciatica presence unspecified  Muscle weakness (generalized)  Abnormal posture  Other abnormalities of gait and mobility     Problem List Patient Active Problem List   Diagnosis Date Noted  . Lumbar pseudoarthrosis 06/04/2016  . Lumbar stenosis 04/08/2016  . S/P cervical spinal fusion 07/13/2015  . Cervical stenosis of spinal canal 07/12/2015  . HNP (herniated nucleus  pulposus), cervical 06/19/2015  . Numbness 05/20/2015  . Chronic rheumatic arthritis ([place]) 05/20/2015  . Carpal tunnel syndrome 05/20/2015  . Snoring 05/20/2015  . Excessive daytime sleepiness 05/20/2015   Starr Lake PT, DPT, LAT, ATC  10/15/16  9:10 AM      [facility] [address] [place], [place], [address] Phone: [phone]   Fax:  [phone]  Name: Anne Garrett MRN: [id number] Date of Birth: Oct 15, 1975

## 2016-10-21 DIAGNOSIS — H5213 Myopia, bilateral: Secondary | ICD-10-CM | POA: Diagnosis not present

## 2016-10-21 MED FILL — predniSONE 5 MG TABS: 5 | 90 days supply | Qty: 180 | Fill #0

## 2016-10-26 ENCOUNTER — Ambulatory Visit
Admission: RE | Admit: 2016-10-26 | Discharge: 2016-10-26 | Disposition: A | Discharge status: Home / Self Care | Payer: PRIVATE HEALTH INSURANCE | Source: Ambulatory Visit | Attending: Physician Assistant | Admitting: Physician Assistant

## 2016-10-26 ENCOUNTER — Ambulatory Visit: Admission: EM | Admit: 2016-10-26 | Discharge: 2016-10-26 | Discharge status: Absent without leave | Payer: 59

## 2016-10-26 ENCOUNTER — Other Ambulatory Visit: Payer: Self-pay | Admitting: Physician Assistant

## 2016-10-26 DIAGNOSIS — M8580 Other specified disorders of bone density and structure, unspecified site: Secondary | ICD-10-CM | POA: Diagnosis not present

## 2016-10-26 DIAGNOSIS — M19072 Primary osteoarthritis, left ankle and foot: Secondary | ICD-10-CM | POA: Insufficient documentation

## 2016-10-26 DIAGNOSIS — M79672 Pain in left foot: Secondary | ICD-10-CM

## 2016-10-29 ENCOUNTER — Encounter: Payer: Self-pay | Admitting: Physical Therapy

## 2016-10-29 ENCOUNTER — Ambulatory Visit: Payer: 59 | Admitting: Physical Therapy

## 2016-10-29 DIAGNOSIS — G8929 Other chronic pain: Secondary | ICD-10-CM

## 2016-10-29 DIAGNOSIS — M6281 Muscle weakness (generalized): Secondary | ICD-10-CM

## 2016-10-29 DIAGNOSIS — R2689 Other abnormalities of gait and mobility: Secondary | ICD-10-CM | POA: Diagnosis not present

## 2016-10-29 DIAGNOSIS — M545 Low back pain: Secondary | ICD-10-CM | POA: Diagnosis not present

## 2016-10-29 DIAGNOSIS — R293 Abnormal posture: Secondary | ICD-10-CM

## 2016-10-29 NOTE — Therapy (Signed)
[place], [place], [address] Phone: [phone]   Fax:  [phone]  Physical Therapy Treatment  Patient Details  Name: Anne Garrett MRN: [id number] Date of Birth: 14-Apr-1975 Referring Provider: Ovidio Kin MD  Encounter Date: 10/29/2016      PT End of Session - 10/29/16 0900    Visit Number 3   Number of Visits 13   Date for PT Re-Evaluation 11/25/16   PT Start Time 0804   PT Stop Time 0846   PT Time Calculation (min) 42 min   Activity Tolerance Patient tolerated treatment well   Behavior During Therapy [facility] for tasks assessed/performed      Past Medical History:  Diagnosis Date  . Adrenal insufficiency ([place])    secondary to Prednisone  . Eczema    coner oif eye lid  . Headache    Migraine  . Heart murmur    only hear once slight  . Intervertebral disk disease   . Osteoporosis   . Rheumatoid arthritis ([place])    Juvenile onset  . Sleep apnea    questionable  uses dental device at night to help with snoring    Past Surgical History:  Procedure Laterality Date  . ANKLE ARTHROPLASTY Right   . ANTERIOR CERVICAL DECOMP/DISCECTOMY FUSION N/A 06/19/2015   Procedure: C3-4 C4-5 Anterior cervical decompression/diskectomy/fusion;  Surgeon: Jovita Gamma, MD;  Location: [place] [facility];  Service: Neurosurgery;  Laterality: N/A;  C3-4 C4-5 Anterior cervical decompression/diskectomy/fusion  . APPENDECTOMY    . CARPAL TUNNEL RELEASE Right   . CESAREAN SECTION    . CHOLECYSTECTOMY    . COLONOSCOPY    . ESSURE TUBAL LIGATION     Essure Implant-Permanent birth control  . excision to palm Right    excision of mass to right plam  . JOINT REPLACEMENT Bilateral    bilat hip replacements  . ORIF FEMUR FRACTURE Right   . OTHER SURGICAL HISTORY  06/04/2016   Revision of PLIF  . plif l5-s1 arthrodeisis    2018  . POSTERIOR CERVICAL FUSION/FORAMINOTOMY N/A 07/12/2015   Procedure: C2 to C5 Cervical  laminectomy, C2 to C5 posterior cervical arthrodesis with instrumentation and bone graft;  Surgeon: Jovita Gamma, MD;  Location: [place] [facility];  Service: Neurosurgery;  Laterality: N/A;  C2 to C5 Cervical laminectomy, C2  to C5 posterior cervical arthrodesis with instrumentation and bone graft  . right foot surgery Right    right jfoot arthrodesis and right ankle arthroplasty  . WRIST SURGERY Bilateral    arthrodesis    There were no vitals filed for this visit.      Subjective Assessment - 10/29/16 0805    Subjective "I sprained my foot on Monday, I rolled it and my foot is still swollen. Since I have been walking different it is starting to bother my back"    Currently in Pain? Yes   Pain Score 0-No pain  reports its just tight   Pain Location Back   Pain Type Chronic pain   Aggravating Factors  prolonged standing/ sitting,    Pain Relieving Factors medication, changing position.                          [facility] Adult PT Treatment/Exercise - 10/29/16 0842      Lumbar Exercises: Stretches   Hip Flexor Stretch 2 reps;30 seconds   Piriformis Stretch --     Lumbar Exercises: Supine   Other  Supine Lumbar Exercises heel slides with ADIM  2 x 10     Manual Therapy   Manual Therapy Joint mobilization   Joint Mobilization long axis distraction RLE grade 4   Soft tissue mobilization IASTM over the glute med/ piriformis/ and QL          Trigger Point Dry Needling - 10/29/16 1791    Consent Given? Yes   Education Handout Provided No  given previously   Gluteus Minimus Response Twitch response elicited;Palpable increased muscle length   Piriformis Response Twitch response elicited;Palpable increased muscle length                PT Short Term Goals - 10/29/16 0906      PT SHORT TERM GOAL #1   Title pt to be I with inital HEP   Time 4   Period Weeks   Status Achieved     PT SHORT TERM GOAL #2   Title pt to verbalize and demo proper posture with  sitting/ standing and lifting mechanics to prevent and reduce low back pain   Time 4   Period Weeks   Status On-going     PT SHORT TERM GOAL #3   Title Reduce muscle tightness in the hip flexor/ piriformis to reduce N/T and pain to </= 4/10 to promote function   Period Weeks   Status Partially Met           PT Long Term Goals - 09/30/16 1008      PT LONG TERM GOAL #1   Title pt to report no N/T in the RLE for over 1 week to demonstrate improvement in function   Time 8   Period Weeks   Status New   Target Date 11/25/16     PT LONG TERM GOAL #2   Title pt to increase RLE strength to >/= 4+/5 to promote hip stability with walking/ standing and maintain proper posture with lifting/ carrying activities.    Time 8   Period Weeks   Status New   Target Date 11/25/16     PT LONG TERM GOAL #3   Title pt to be able to sit , stand and walk for >/= 45 min with </= 1/10 pain and no report of N/T for functional endurance required for work related activities.    Time 8   Period Weeks   Status New   Target Date 11/25/16     PT LONG TERM GOAL #4   Title Increase FOTO score to </= 38% limited to demo improvement in function   Time 8   Period Weeks   Status New   Target Date 11/25/16     PT LONG TERM GOAL #5   Title pt will be I with all HEP given as of last visit    Time 8   Period Weeks   Status New   Target Date 11/25/16               Plan - 10/29/16 0900    Clinical Impression Statement Pt reports she had rolled her L ankle a few days ago and reports increased soreness in the low back. continued TPDN for the QL and progressing with pirifomris followed swith IASTM techniques. continued manual hip techniques, stretching and core strengthening. she reported soreness post session from the DN, but declined modalities post session.    PT Treatment/Interventions ADLs/Self Care Home Management;Cryotherapy;Electrical Stimulation;Iontophoresis 34m/ml Dexamethasone;Moist  Heat;Ultrasound;Therapeutic activities;Therapeutic exercise;Dry needling;Manual techniques;Taping;Patient/family education;Gait training;Stair training;Passive range of motion;Neuromuscular  re-education;Balance training   PT Next Visit Plan update HEP PRN, response to DN,  posture, piriformis stretching/ hamstring stretching, core strengthening,    PT Home Exercise Plan piriformis stretching, hamstring stretching, lower trunk rotation, clams, hip flexor stretching   Consulted and Agree with Plan of Care Patient      Patient will benefit from skilled therapeutic intervention in order to improve the following deficits and impairments:  Pain, Improper body mechanics, Postural dysfunction, Decreased range of motion, Decreased endurance, Decreased activity tolerance, Decreased strength, Increased fascial restricitons, Increased muscle spasms, Decreased balance, Impaired flexibility, Other (comment)  Visit Diagnosis: Chronic right-sided low back pain, with sciatica presence unspecified  Muscle weakness (generalized)  Abnormal posture  Other abnormalities of gait and mobility     Problem List Patient Active Problem List   Diagnosis Date Noted  . Lumbar pseudoarthrosis 06/04/2016  . Lumbar stenosis 04/08/2016  . S/P cervical spinal fusion 07/13/2015  . Cervical stenosis of spinal canal 07/12/2015  . HNP (herniated nucleus pulposus), cervical 06/19/2015  . Numbness 05/20/2015  . Chronic rheumatic arthritis ([place]) 05/20/2015  . Carpal tunnel syndrome 05/20/2015  . Snoring 05/20/2015  . Excessive daytime sleepiness 05/20/2015   Starr Lake PT, DPT, LAT, ATC  10/29/16  9:10 AM      [facility] [address] [place], [place], [address] Phone: [phone]   Fax:  [phone]  Name: Anne Garrett MRN: [id number] Date of Birth: 28-Jun-1975

## 2016-11-08 MED FILL — GABAPENTIN 300 MG CAPSULE: 300 | 30 days supply | Qty: 120 | Fill #4

## 2016-11-11 ENCOUNTER — Encounter: Payer: Self-pay | Admitting: Physical Therapy

## 2016-11-11 ENCOUNTER — Other Ambulatory Visit: Payer: Self-pay

## 2016-11-11 ENCOUNTER — Ambulatory Visit: Payer: 59 | Attending: Neurosurgery | Admitting: Physical Therapy

## 2016-11-11 DIAGNOSIS — M6281 Muscle weakness (generalized): Secondary | ICD-10-CM | POA: Diagnosis not present

## 2016-11-11 DIAGNOSIS — M545 Low back pain: Secondary | ICD-10-CM | POA: Insufficient documentation

## 2016-11-11 DIAGNOSIS — G8929 Other chronic pain: Secondary | ICD-10-CM | POA: Insufficient documentation

## 2016-11-11 DIAGNOSIS — R293 Abnormal posture: Secondary | ICD-10-CM | POA: Insufficient documentation

## 2016-11-11 DIAGNOSIS — R2689 Other abnormalities of gait and mobility: Secondary | ICD-10-CM | POA: Insufficient documentation

## 2016-11-11 NOTE — Therapy (Addendum)
[place], [place], [address] Phone: [phone]   Fax:  [phone]  Physical Therapy Treatment / Discharge summary  Patient Details  Name: Anne Garrett MRN: [id number] Date of Birth: Oct 13, 1975 Referring Provider: Ovidio Kin MD   Encounter Date: 11/11/2016  PT End of Session - 11/11/16 0852    Visit Number  4    Number of Visits  13    Date for PT Re-Evaluation  11/25/16    PT Start Time  0801    PT Stop Time  0848    PT Time Calculation (min)  47 min    Activity Tolerance  Patient tolerated treatment well    Behavior During Therapy  [facility] for tasks assessed/performed       Past Medical History:  Diagnosis Date  . Adrenal insufficiency ([place])    secondary to Prednisone  . Eczema    coner oif eye lid  . Headache    Migraine  . Heart murmur    only hear once slight  . Intervertebral disk disease   . Osteoporosis   . Rheumatoid arthritis ([place])    Juvenile onset  . Sleep apnea    questionable  uses dental device at night to help with snoring    Past Surgical History:  Procedure Laterality Date  . ANKLE ARTHROPLASTY Right   . APPENDECTOMY    . CARPAL TUNNEL RELEASE Right   . CESAREAN SECTION    . CHOLECYSTECTOMY    . COLONOSCOPY    . ESSURE TUBAL LIGATION     Essure Implant-Permanent birth control  . excision to palm Right    excision of mass to right plam  . JOINT REPLACEMENT Bilateral    bilat hip replacements  . ORIF FEMUR FRACTURE Right   . OTHER SURGICAL HISTORY  06/04/2016   Revision of PLIF  . plif l5-s1 arthrodeisis    2018  . right foot surgery Right    right jfoot arthrodesis and right ankle arthroplasty  . WRIST SURGERY Bilateral    arthrodesis    There were no vitals filed for this visit.  Subjective Assessment - 11/11/16 0803    Subjective  "the ankle is still swollen in the foot but it is about 80% better, I have started taking my muscle relaxers"      Currently in Pain?  Yes    Pain Score  0-No pain    Pain Orientation  Right    Pain Onset  More than a month ago    Pain Frequency  Intermittent    Aggravating Factors   prolong standing/ walking    Pain Relieving Factors  medication, changing postion                      [facility] Adult PT Treatment/Exercise - 11/11/16 0900      Lumbar Exercises: Stretches   Active Hamstring Stretch  -- adductor stretch 2 x 30   adductor stretch 2 x 30   Hip Flexor Stretch  3 reps;30 seconds contract/ relax with  10 sec contraction   contract/ relax with  10 sec contraction     Lumbar Exercises: Standing   Other Standing Lumbar Exercises  Standing hip extension 2 x 10 3#, hamstring curl 2 x10 RLE 3#      Lumbar Exercises: Sidelying   Hip Abduction  -- 2 x 10 with controlled eccentric lowering   2 x 10 with controlled eccentric lowering  PT Education - 11/11/16 [phone]    Education provided  Yes    Education Details  updated HEP for standing hip extensor and hamstring curls    Person(s) Educated  Patient    Methods  Explanation    Comprehension  Verbalized understanding       PT Short Term Goals - 10/29/16 0906      PT SHORT TERM GOAL #1   Title  pt to be I with inital HEP    Time  4    Period  Weeks    Status  Achieved      PT SHORT TERM GOAL #2   Title  pt to verbalize and demo proper posture with sitting/ standing and lifting mechanics to prevent and reduce low back pain    Time  4    Period  Weeks    Status  On-going      PT SHORT TERM GOAL #3   Title  Reduce muscle tightness in the hip flexor/ piriformis to reduce N/T and pain to </= 4/10 to promote function    Period  Weeks    Status  Partially Met        PT Long Term Goals - 09/30/16 1008      PT LONG TERM GOAL #1   Title  pt to report no N/T in the RLE for over 1 week to demonstrate improvement in function    Time  8    Period  Weeks    Status  New    Target Date  11/25/16      PT LONG  TERM GOAL #2   Title  pt to increase RLE strength to >/= 4+/5 to promote hip stability with walking/ standing and maintain proper posture with lifting/ carrying activities.     Time  8    Period  Weeks    Status  New    Target Date  11/25/16      PT LONG TERM GOAL #3   Title  pt to be able to sit , stand and walk for >/= 45 min with </= 1/10 pain and no report of N/T for functional endurance required for work related activities.     Time  8    Period  Weeks    Status  New    Target Date  11/25/16      PT LONG TERM GOAL #4   Title  Increase FOTO score to </= 38% limited to demo improvement in function    Time  8    Period  Weeks    Status  New    Target Date  11/25/16      PT LONG TERM GOAL #5   Title  pt will be I with all HEP given as of last visit     Time  8    Period  Weeks    Status  New    Target Date  11/25/16            Plan - 11/11/16 0853    Clinical Impression Statement  pt reports intermittent low back pain due to rolling her foot/ankle previously. Focused on adductor/ hip flexor stretching which she reported decreased pain and tightness in the calf. tested slump to assess sciatic nerve involvement which was negative, reproduced symptoms with hip flexor stretching. focused on posterior hip and thigh strengthening to promote symmetry. Post session she reported no pain.     PT Next Visit Plan  update HEP PRN, response  to DN,  posture,  hamstring strengthening, core strengthening, posterior hip strengthening.     PT Home Exercise Plan  piriformis stretching, hamstring stretching, lower trunk rotation, clams, hip flexor stretching, standing hip extension, standing hip flexion, butterfly adductor stretch    Consulted and Agree with Plan of Care  Patient       Patient will benefit from skilled therapeutic intervention in order to improve the following deficits and impairments:  Pain, Improper body mechanics, Postural dysfunction, Decreased range of motion, Decreased  endurance, Decreased activity tolerance, Decreased strength, Increased fascial restricitons, Increased muscle spasms, Decreased balance, Impaired flexibility, Other (comment)  Visit Diagnosis: Chronic right-sided low back pain, with sciatica presence unspecified  Muscle weakness (generalized)  Abnormal posture  Other abnormalities of gait and mobility     Problem List Patient Active Problem List   Diagnosis Date Noted  . Lumbar pseudoarthrosis 06/04/2016  . Lumbar stenosis 04/08/2016  . S/P cervical spinal fusion 07/13/2015  . Cervical stenosis of spinal canal 07/12/2015  . HNP (herniated nucleus pulposus), cervical 06/19/2015  . Numbness 05/20/2015  . Chronic rheumatic arthritis ([place]) 05/20/2015  . Carpal tunnel syndrome 05/20/2015  . Snoring 05/20/2015  . Excessive daytime sleepiness 05/20/2015   Starr Lake PT, DPT, LAT, ATC  11/11/16  9:06 AM      [facility] [address] [place], [place], [address] Phone: [phone]   Fax:  [phone]  Name: Anne Garrett MRN: [id number] Date of Birth: 1975/01/11      PHYSICAL THERAPY DISCHARGE SUMMARY  Visits from Start of Care: 4  Current functional level related to goals / functional outcomes: See goals   Remaining deficits: Pt reported she is doing really good and no longer needs physical therpay   Education / Equipment: HEP, theraband, posture,   Plan: Patient agrees to discharge.  Patient goals were partially met. Patient is being discharged due to the patient's request.  ?????    Odesser Tourangeau PT, DPT, LAT, ATC  12/14/16  4:04 PM

## 2016-11-15 MED FILL — XELJANZ 5 MG TABS: 5 | 30 days supply | Qty: 60 | Fill #1

## 2016-11-16 ENCOUNTER — Other Ambulatory Visit: Payer: Self-pay | Admitting: Rheumatology

## 2016-11-16 ENCOUNTER — Other Ambulatory Visit: Payer: Self-pay | Admitting: Family Medicine

## 2016-11-16 DIAGNOSIS — Z1231 Encounter for screening mammogram for malignant neoplasm of breast: Secondary | ICD-10-CM

## 2016-11-16 DIAGNOSIS — M810 Age-related osteoporosis without current pathological fracture: Secondary | ICD-10-CM

## 2016-11-19 MED FILL — METHOCARBAMOL 500 MG TABS: 500 | 30 days supply | Qty: 90 | Fill #0

## 2016-11-23 ENCOUNTER — Encounter: Payer: Self-pay | Admitting: Physical Therapy

## 2016-11-27 IMAGING — MG MM DIGITAL SCREENING BILAT W/ CAD
4 series · 4 of 4 positions shown · non-contrast
Comparison: Previous exam(s).

CLINICAL DATA: Screening.

EXAM:
DIGITAL SCREENING BILATERAL MAMMOGRAM WITH CAD

[L CC]
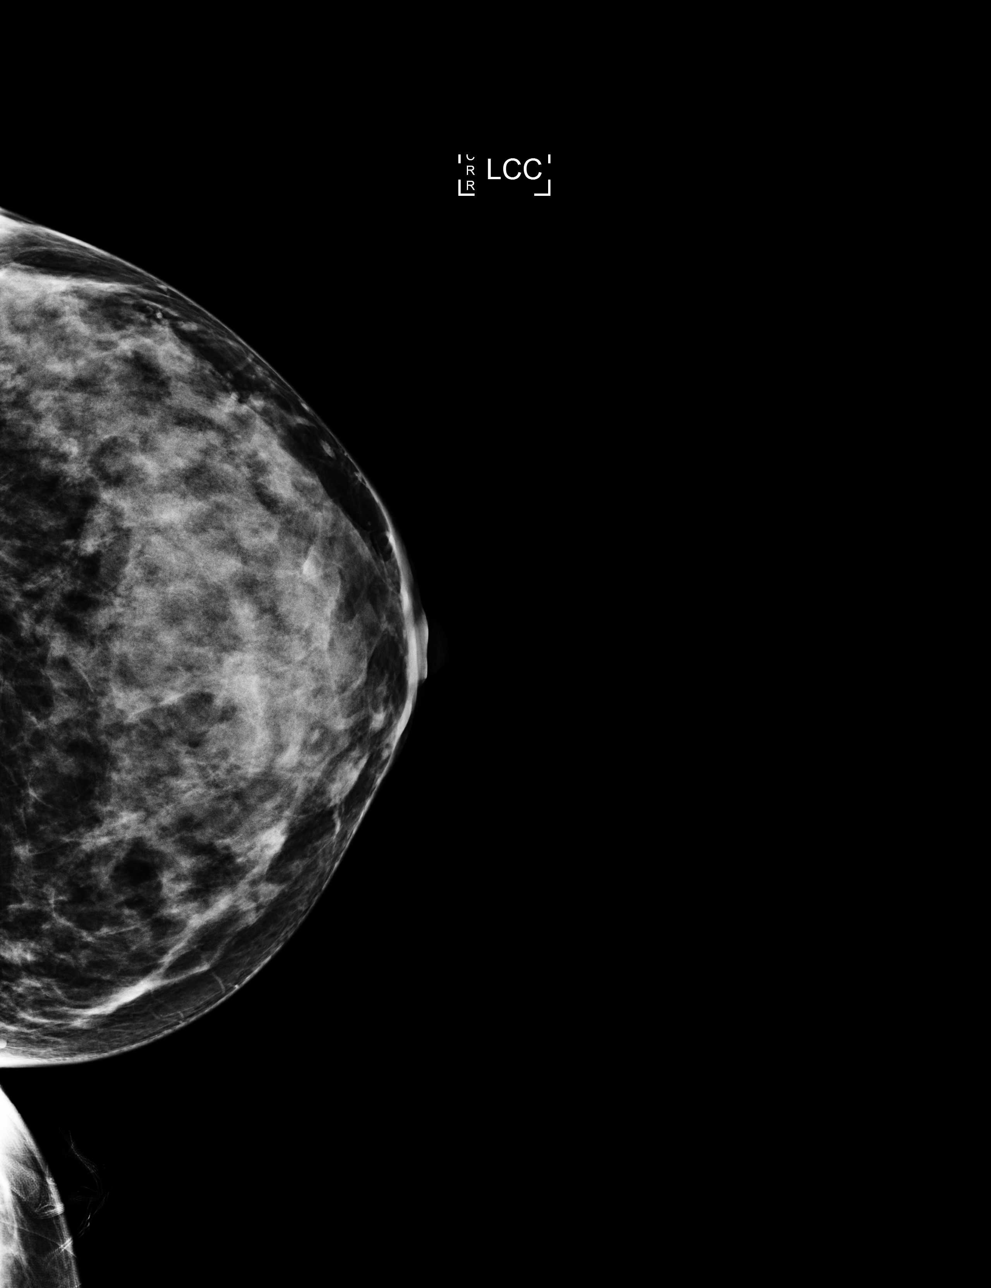

[R MLO]
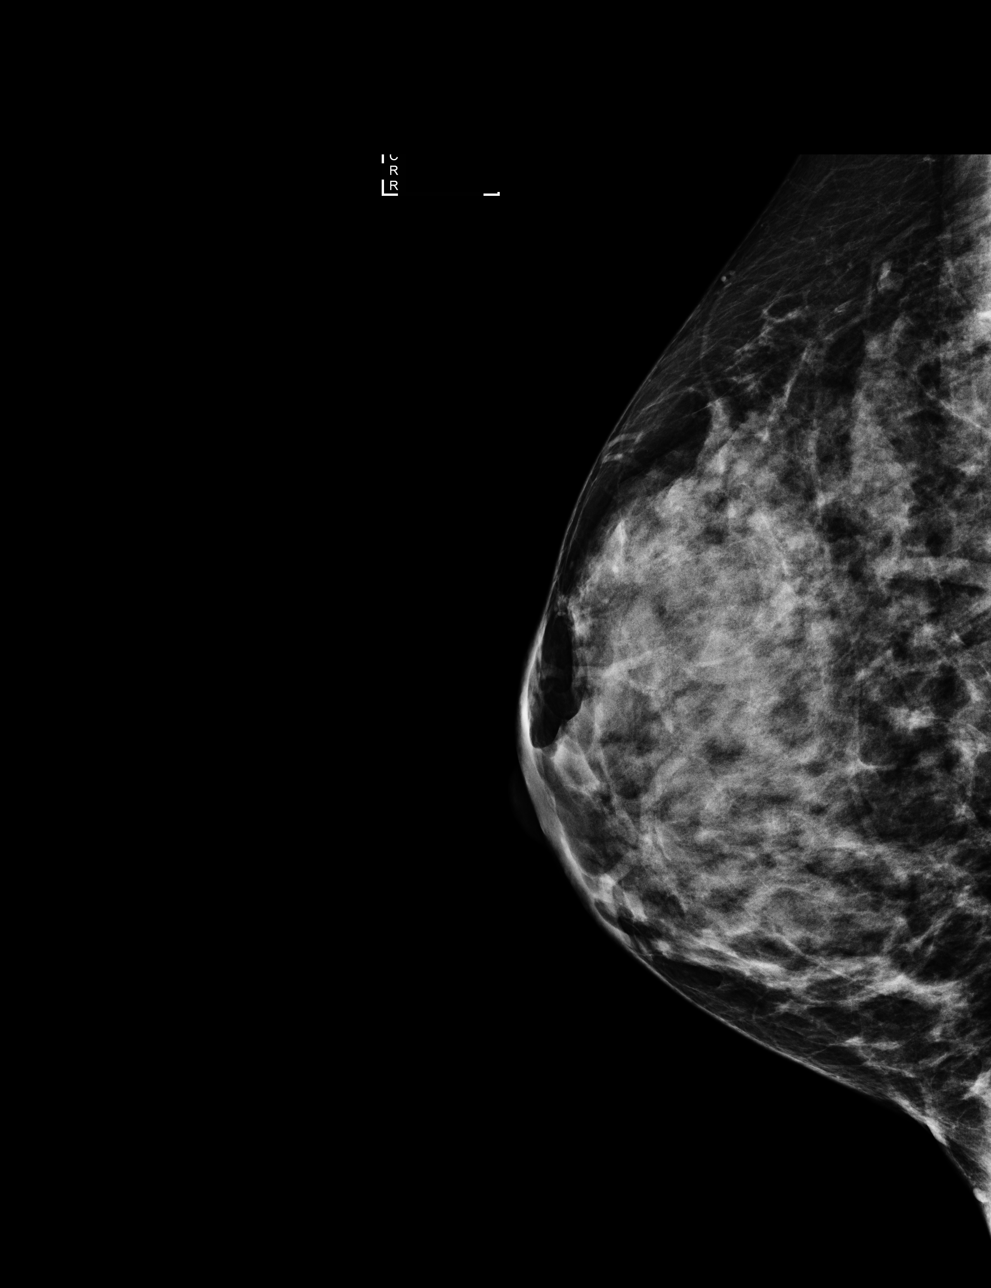

[L MLO]
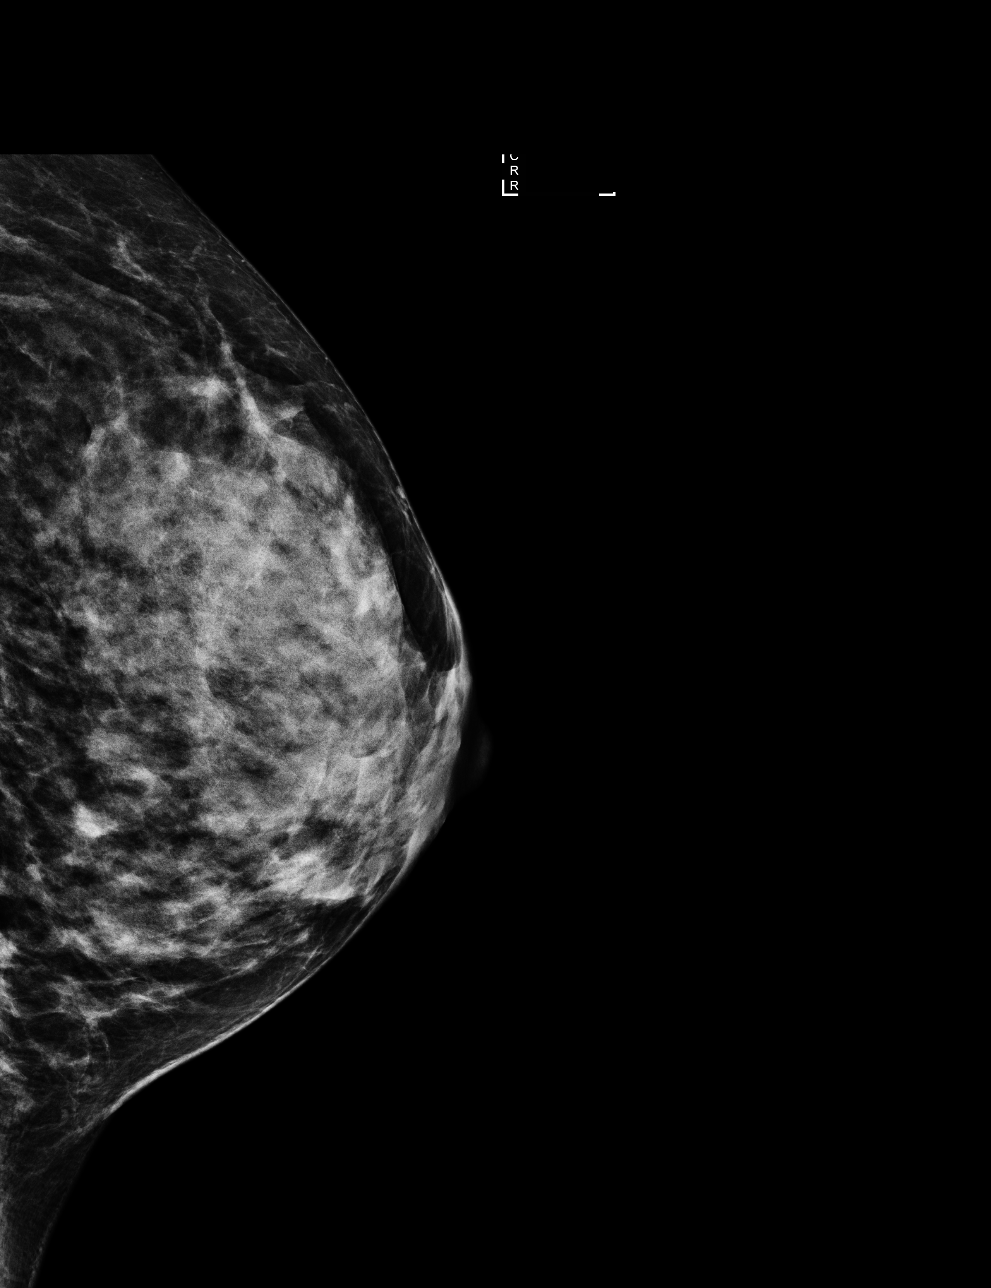

[R CC]
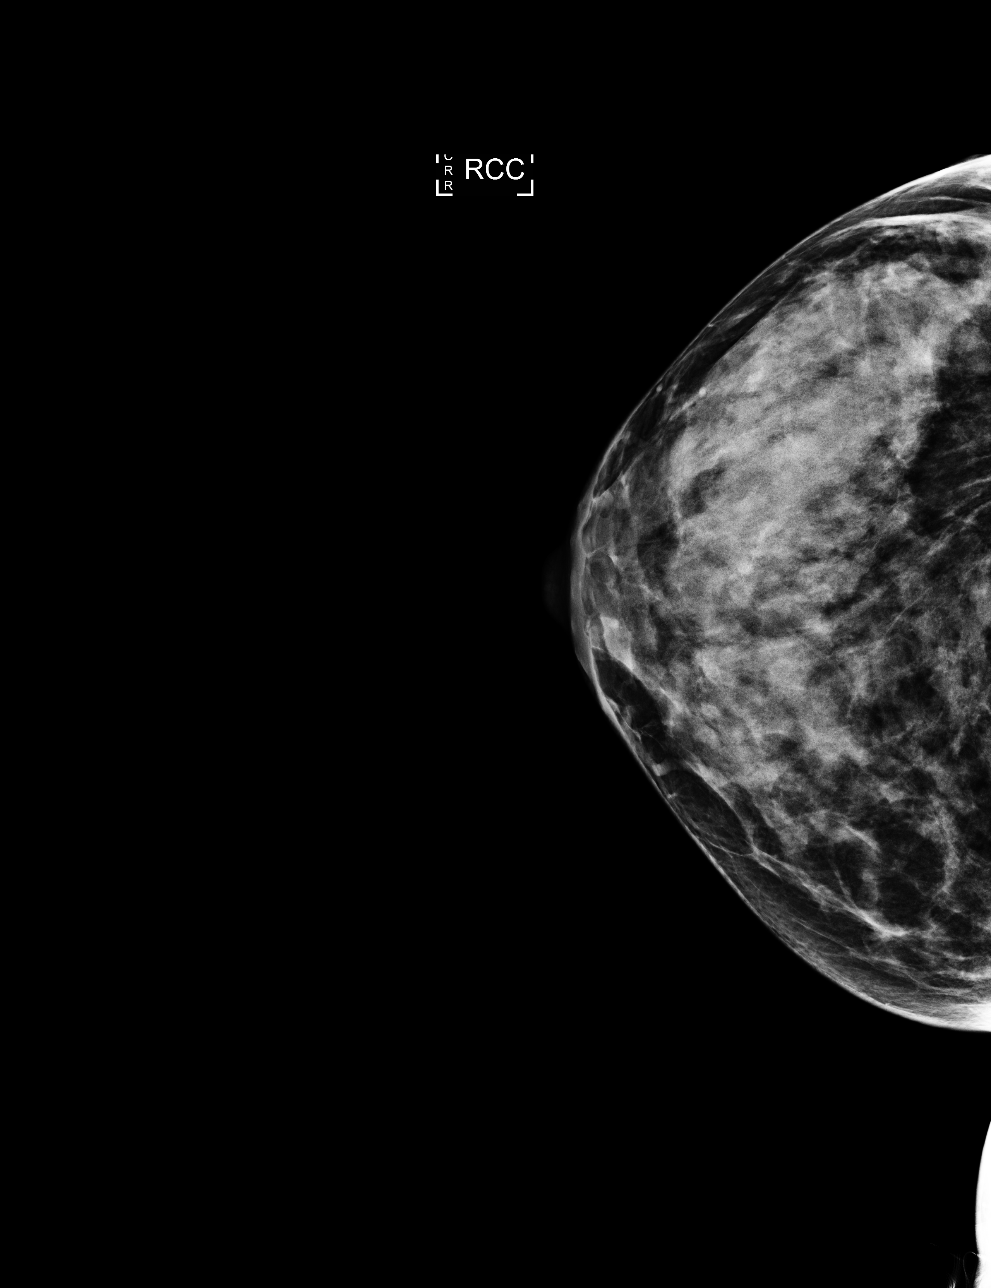

[4 of 4 positions shown; findings below may reference images not displayed]

ACR Breast Density Category c: The breast tissue is heterogeneously
dense, which may obscure small masses.
FINDINGS: There are no findings suspicious for malignancy. Images were
processed with CAD.
IMPRESSION: No mammographic evidence of malignancy. A result letter of this
screening mammogram will be mailed directly to the patient.

RECOMMENDATION:
Screening mammogram in one year. (Code:YJ-[id number])

BI-RADS CATEGORY  1: Negative.

## 2016-11-30 MED FILL — busPIRone HCL 5 MG TABS: 5 | 30 days supply | Qty: 60 | Fill #0

## 2016-12-06 DIAGNOSIS — Z23 Encounter for immunization: Secondary | ICD-10-CM | POA: Diagnosis not present

## 2016-12-07 DIAGNOSIS — D2272 Melanocytic nevi of left lower limb, including hip: Secondary | ICD-10-CM | POA: Diagnosis not present

## 2016-12-07 DIAGNOSIS — L603 Nail dystrophy: Secondary | ICD-10-CM | POA: Diagnosis not present

## 2016-12-07 DIAGNOSIS — L821 Other seborrheic keratosis: Secondary | ICD-10-CM | POA: Diagnosis not present

## 2016-12-07 DIAGNOSIS — D2271 Melanocytic nevi of right lower limb, including hip: Secondary | ICD-10-CM | POA: Diagnosis not present

## 2016-12-07 DIAGNOSIS — D1801 Hemangioma of skin and subcutaneous tissue: Secondary | ICD-10-CM | POA: Diagnosis not present

## 2016-12-07 DIAGNOSIS — D2262 Melanocytic nevi of left upper limb, including shoulder: Secondary | ICD-10-CM | POA: Diagnosis not present

## 2016-12-07 DIAGNOSIS — D2261 Melanocytic nevi of right upper limb, including shoulder: Secondary | ICD-10-CM | POA: Diagnosis not present

## 2016-12-07 DIAGNOSIS — D225 Melanocytic nevi of trunk: Secondary | ICD-10-CM | POA: Diagnosis not present

## 2016-12-07 DIAGNOSIS — D485 Neoplasm of uncertain behavior of skin: Secondary | ICD-10-CM | POA: Diagnosis not present

## 2016-12-08 ENCOUNTER — Encounter: Payer: Self-pay | Admitting: Physical Therapy

## 2016-12-15 MED FILL — XELJANZ 5 MG TABS: 5 | 30 days supply | Qty: 60 | Fill #2

## 2016-12-20 DIAGNOSIS — Z981 Arthrodesis status: Secondary | ICD-10-CM | POA: Diagnosis not present

## 2016-12-20 DIAGNOSIS — M5136 Other intervertebral disc degeneration, lumbar region: Secondary | ICD-10-CM | POA: Diagnosis not present

## 2016-12-20 DIAGNOSIS — M4316 Spondylolisthesis, lumbar region: Secondary | ICD-10-CM | POA: Diagnosis not present

## 2016-12-20 DIAGNOSIS — M47816 Spondylosis without myelopathy or radiculopathy, lumbar region: Secondary | ICD-10-CM | POA: Diagnosis not present

## 2016-12-21 ENCOUNTER — Inpatient Hospital Stay: Admission: RE | Admit: 2016-12-21 | Payer: Self-pay | Source: Ambulatory Visit

## 2016-12-21 ENCOUNTER — Ambulatory Visit: Payer: Self-pay

## 2016-12-22 ENCOUNTER — Encounter: Payer: Self-pay | Admitting: Physical Therapy

## 2016-12-30 ENCOUNTER — Other Ambulatory Visit: Payer: Self-pay | Admitting: Family Medicine

## 2016-12-30 ENCOUNTER — Ambulatory Visit
Admission: RE | Admit: 2016-12-30 | Discharge: 2016-12-30 | Disposition: A | Discharge status: Home / Self Care | Payer: 59 | Source: Ambulatory Visit | Attending: Family Medicine | Admitting: Family Medicine

## 2016-12-30 ENCOUNTER — Ambulatory Visit
Admission: RE | Admit: 2016-12-30 | Discharge: 2016-12-30 | Disposition: A | Discharge status: Home / Self Care | Payer: 59 | Source: Ambulatory Visit | Attending: Rheumatology | Admitting: Rheumatology

## 2016-12-30 DIAGNOSIS — Z13820 Encounter for screening for osteoporosis: Secondary | ICD-10-CM | POA: Insufficient documentation

## 2016-12-30 DIAGNOSIS — Z1231 Encounter for screening mammogram for malignant neoplasm of breast: Secondary | ICD-10-CM | POA: Insufficient documentation

## 2016-12-30 DIAGNOSIS — M810 Age-related osteoporosis without current pathological fracture: Secondary | ICD-10-CM | POA: Insufficient documentation

## 2017-01-11 DIAGNOSIS — Z23 Encounter for immunization: Secondary | ICD-10-CM | POA: Diagnosis not present

## 2017-01-11 DIAGNOSIS — F439 Reaction to severe stress, unspecified: Secondary | ICD-10-CM | POA: Diagnosis not present

## 2017-01-11 DIAGNOSIS — G43009 Migraine without aura, not intractable, without status migrainosus: Secondary | ICD-10-CM | POA: Diagnosis not present

## 2017-01-11 DIAGNOSIS — Z0000 Encounter for general adult medical examination without abnormal findings: Secondary | ICD-10-CM | POA: Diagnosis not present

## 2017-01-11 MED FILL — BUTALB-ACETAMIN-CAFF 50-325: 50-325-40 | 5 days supply | Qty: 30 | Fill #0

## 2017-01-11 MED FILL — busPIRone HCL 5 MG TABS: 5 | 30 days supply | Qty: 90 | Fill #0

## 2017-01-11 MED FILL — clonazePAM 1 MG TABS: 1 | 30 days supply | Qty: 30 | Fill #0

## 2017-01-14 DIAGNOSIS — M06031 Rheumatoid arthritis without rheumatoid factor, right wrist: Secondary | ICD-10-CM | POA: Diagnosis not present

## 2017-01-14 MED FILL — XELJANZ 5 MG TABS: 5 | 30 days supply | Qty: 60 | Fill #3

## 2017-01-21 MED FILL — METHOCARBAMOL 500 MG TABS: 500 | 30 days supply | Qty: 90 | Fill #0

## 2017-01-25 NOTE — H&P (Signed)
Anne Garrett is an 42 y.o. female.   Chief Complaint: RIGHT WRIST EXTENSOR TENDON RUPTURE ON THE DORSUM OF THE HAND  HPI: the patient is a 42 year old right-hand dominant female who has had previous surgery on the right wrist. She has a history of rheumatoid arthritis.  She has been having continuous pain in the wrist that is affecting her normal daily activities.  She was seen in the office for evaluation. Discussed the reason and rationale for surgical intervention and removal of the hardware as well as for the repair of the tendons. Discussed the surgical procedure, including the risks versus benefits, and the postoperative recovery. The patient is here today for surgery.  Past Medical History:  Diagnosis Date  . Adrenal insufficiency ([place])    secondary to Prednisone  . Eczema    coner oif eye lid  . Headache    Migraine  . Heart murmur    only hear once slight  . Intervertebral disk disease   . Osteoporosis   . Rheumatoid arthritis ([place])    Juvenile onset  . Sleep apnea    questionable  uses dental device at night to help with snoring    Past Surgical History:  Procedure Laterality Date  . ANKLE ARTHROPLASTY Right   . ANTERIOR CERVICAL DECOMP/DISCECTOMY FUSION N/A 06/19/2015   Procedure: C3-4 C4-5 Anterior cervical decompression/diskectomy/fusion;  Surgeon: Jovita Gamma, MD;  Location: [facility];  Service: Neurosurgery;  Laterality: N/A;  C3-4 C4-5 Anterior cervical decompression/diskectomy/fusion  . APPENDECTOMY    . CARPAL TUNNEL RELEASE Right   . CESAREAN SECTION    . CHOLECYSTECTOMY    . COLONOSCOPY    . ESSURE TUBAL LIGATION     Essure Implant-Permanent birth control  . excision to palm Right    excision of mass to right plam  . JOINT REPLACEMENT Bilateral    bilat hip replacements  . ORIF FEMUR FRACTURE Right   . OTHER SURGICAL HISTORY  06/04/2016   Revision of PLIF  . plif l5-s1 arthrodeisis    2018  . POSTERIOR CERVICAL FUSION/FORAMINOTOMY N/A  07/12/2015   Procedure: C2 to C5 Cervical laminectomy, C2 to C5 posterior cervical arthrodesis with instrumentation and bone graft;  Surgeon: Jovita Gamma, MD;  Location: [facility];  Service: Neurosurgery;  Laterality: N/A;  C2 to C5 Cervical laminectomy, C2  to C5 posterior cervical arthrodesis with instrumentation and bone graft  . right foot surgery Right    right jfoot arthrodesis and right ankle arthroplasty  . WRIST SURGERY Bilateral    arthrodesis    Family History  Problem Relation Age of Onset  . Breast cancer Mother 53  . Nephritis Father   . Epilepsy Father   . Breast cancer Maternal Aunt        2 mat aunts   . Multiple sclerosis Maternal Uncle   . Psoriasis Paternal Aunt    Social History:  reports that she quit smoking about 18 months ago. Her smoking use included cigarettes. She has a 5.00 pack-year smoking history. she has never used smokeless tobacco. She reports that she drinks about 1.2 oz of alcohol per week. She reports that she does not use drugs.  Allergies:  Allergies  Allergen Reactions  . Sulfa Antibiotics Itching and Rash    Head to toe  . Teriparatide Other (See Comments)    Severe Muscle Pain, Forteo  . Valproic Acid And Related Rash    Head to toe  . Celexa [Citalopram] Diarrhea       .  Leflunomide Diarrhea  . Methotrexate Diarrhea and Other (See Comments)    Fatigue  . Paroxetine Hcl Diarrhea  . Zoloft [Sertraline] Diarrhea    No medications prior to admission.    No results found for this or any previous visit (from the past 48 hour(s)). No results found.  ROS NO RECENT ILLNESSES OR HOSPITALIZATIONS  Last menstrual period 12/28/2016. Physical Exam  General Appearance:  Alert, cooperative, no distress, appears stated age  Head:  Normocephalic, without obvious abnormality, atraumatic  Eyes:  Pupils equal, conjunctiva/corneas clear,         Throat: Lips, mucosa, and tongue normal; teeth and gums normal  Neck: No visible masses      Lungs:   respirations unlabored  Chest Wall:  No tenderness or deformity  Heart:  Regular rate and rhythm,  Abdomen:   Soft, non-tender,         Extremities: RUE: WELL HEALED DORSAL INCISION, UNABLE TO EXTEND RING AND SMALL FINGERS FINGERS WARM WELL PERFUSED   Pulses: 2+ and symmetric  Skin: Skin color, texture, turgor normal, no rashes or lesions     Neurologic: Normal    Assessment RIGHT WRIST EXTENSOR TENDON RUPTURE ON THE DORSUM OF THE HAND  Plan RIGHT WRIST DEEP IMPLANT REMOVAL; RIGHT WRIST AND HAND EXTENSOR TENDON RECONSTRUCTION WITH TENDON TRANSFER AND RECONSTRUCTION AS INDICATED  R/B/A DISCUSSED WITH PT IN OFFICE.  PT VOICED UNDERSTANDING OF PLAN CONSENT SIGNED DAY OF SURGERY PT SEEN AND EXAMINED PRIOR TO OPERATIVE PROCEDURE/DAY OF SURGERY SITE MARKED. QUESTIONS ANSWERED WILL GO HOME FOLLOWING SURGERY  WE ARE PLANNING SURGERY FOR YOUR UPPER EXTREMITY. THE RISKS AND BENEFITS OF SURGERY INCLUDE BUT NOT LIMITED TO BLEEDING INFECTION, DAMAGE TO NEARBY NERVES ARTERIES TENDONS, FAILURE OF SURGERY TO ACCOMPLISH ITS INTENDED GOALS, PERSISTENT SYMPTOMS AND NEED FOR FURTHER SURGICAL INTERVENTION. WITH THIS IN MIND WE WILL PROCEED. I HAVE DISCUSSED WITH THE PATIENT THE PRE AND POSTOPERATIVE REGIMEN AND THE DOS AND DON'TS. PT VOICED UNDERSTANDING AND INFORMED CONSENT SIGNED.    Brynda Peon 01/25/2017, 3:36 PM

## 2017-01-26 DIAGNOSIS — B9689 Other specified bacterial agents as the cause of diseases classified elsewhere: Secondary | ICD-10-CM | POA: Diagnosis not present

## 2017-01-26 DIAGNOSIS — J019 Acute sinusitis, unspecified: Secondary | ICD-10-CM | POA: Diagnosis not present

## 2017-01-31 MED FILL — IBANDRONATE NA 150 MG TAB: 150 | 90 days supply | Qty: 3 | Fill #2

## 2017-01-31 NOTE — Pre-Procedure Instructions (Signed)
Anne Garrett  01/31/2017      [id number] [place], [place] - [address] [place] [address] Phone: [phone] Fax: [phone]    Your procedure is scheduled on Wed. Jan. 30  Report to [facility] Admitting at 1:15 P.M.  Call this number if you have problems the morning of surgery:  [phone]   Remember:  Do not eat food or drink liquids after midnight.  Take these medicines the morning of surgery with A SIP OF WATER : buspirone (buspar), fioricet, clonazepam (klonopin), methocarbamol (robaxin) if needed, xeljanz,               7 days prior to surgery STOP taking any Aspirin(unless otherwise instructed by your surgeon), Aleve, Naproxen, Ibuprofen, Motrin, Advil, Goody's, BC's, all herbal medications, fish oil, and all vitamins   Do not wear jewelry, make-up or nail polish.  Do not wear lotions, powders, or perfumes, or deodorant.  Do not shave 48 hours prior to surgery.  Men may shave face and neck.  Do not bring valuables to the hospital.  [facility] is not responsible for any belongings or valuables.  Contacts, dentures or bridgework may not be worn into surgery.  Leave your suitcase in the car.  After surgery it may be brought to your room.  For patients admitted to the hospital, discharge time will be determined by your treatment team.  Patients discharged the day of surgery will not be allowed to drive home.    Special instructions:  [facility]- Preparing For Surgery  Before surgery, you can play an important role. Because skin is not sterile, your skin needs to be as free of germs as possible. You can reduce the number of germs on your skin by washing with CHG (chlorahexidine gluconate) Soap before surgery.  CHG is an antiseptic cleaner which kills germs and bonds with the skin to continue killing germs even after washing.  Please do not use if you have an allergy to CHG or antibacterial soaps. If your skin becomes  reddened/irritated stop using the CHG.  Do not shave (including legs and underarms) for at least 48 hours prior to first CHG shower. It is OK to shave your face.  Please follow these instructions carefully.   1. Shower the NIGHT BEFORE SURGERY and the MORNING OF SURGERY with CHG.   2. If you chose to wash your hair, wash your hair first as usual with your normal shampoo.  3. After you shampoo, rinse your hair and body thoroughly to remove the shampoo.  4. Use CHG as you would any other liquid soap. You can apply CHG directly to the skin and wash gently with a scrungie or a clean washcloth.   5. Apply the CHG Soap to your body ONLY FROM THE NECK DOWN.  Do not use on open wounds or open sores. Avoid contact with your eyes, ears, mouth and genitals (private parts). Wash Face and genitals (private parts)  with your normal soap.  6. Wash thoroughly, paying special attention to the area where your surgery will be performed.  7. Thoroughly rinse your body with warm water from the neck down.  8. DO NOT shower/wash with your normal soap after using and rinsing off the CHG Soap.  9. Pat yourself dry with a CLEAN TOWEL.  10. Wear CLEAN PAJAMAS to bed the night before surgery, wear comfortable clothes the morning of surgery  11. Place CLEAN SHEETS on your  bed the night of your first shower and DO NOT SLEEP WITH PETS.    Day of Surgery: Do not apply any deodorants/lotions. Please wear clean clothes to the hospital/surgery center.      Please read over the following fact sheets that you were given. Coughing and Deep Breathing and Surgical Site Infection Prevention

## 2017-02-01 ENCOUNTER — Other Ambulatory Visit: Payer: Self-pay

## 2017-02-01 ENCOUNTER — Encounter (HOSPITAL_COMMUNITY)
Admission: RE | Admit: 2017-02-01 | Discharge: 2017-02-01 | Disposition: A | Discharge status: Home / Self Care | Payer: 59 | Source: Ambulatory Visit | Attending: Orthopedic Surgery | Admitting: Orthopedic Surgery

## 2017-02-01 ENCOUNTER — Encounter (HOSPITAL_COMMUNITY): Payer: Self-pay

## 2017-02-01 DIAGNOSIS — M069 Rheumatoid arthritis, unspecified: Secondary | ICD-10-CM | POA: Diagnosis not present

## 2017-02-01 DIAGNOSIS — M150 Primary generalized (osteo)arthritis: Secondary | ICD-10-CM | POA: Diagnosis not present

## 2017-02-01 DIAGNOSIS — E2740 Unspecified adrenocortical insufficiency: Secondary | ICD-10-CM | POA: Diagnosis not present

## 2017-02-01 DIAGNOSIS — M2550 Pain in unspecified joint: Secondary | ICD-10-CM | POA: Diagnosis not present

## 2017-02-01 DIAGNOSIS — Z87891 Personal history of nicotine dependence: Secondary | ICD-10-CM | POA: Diagnosis not present

## 2017-02-01 DIAGNOSIS — Z96643 Presence of artificial hip joint, bilateral: Secondary | ICD-10-CM | POA: Diagnosis not present

## 2017-02-01 DIAGNOSIS — Y929 Unspecified place or not applicable: Secondary | ICD-10-CM | POA: Diagnosis not present

## 2017-02-01 DIAGNOSIS — M810 Age-related osteoporosis without current pathological fracture: Secondary | ICD-10-CM | POA: Diagnosis not present

## 2017-02-01 DIAGNOSIS — Z79899 Other long term (current) drug therapy: Secondary | ICD-10-CM | POA: Diagnosis not present

## 2017-02-01 DIAGNOSIS — Z882 Allergy status to sulfonamides status: Secondary | ICD-10-CM | POA: Diagnosis not present

## 2017-02-01 DIAGNOSIS — X58XXXA Exposure to other specified factors, initial encounter: Secondary | ICD-10-CM | POA: Diagnosis not present

## 2017-02-01 DIAGNOSIS — Y793 Surgical instruments, materials and orthopedic devices (including sutures) associated with adverse incidents: Secondary | ICD-10-CM | POA: Diagnosis not present

## 2017-02-01 DIAGNOSIS — G4730 Sleep apnea, unspecified: Secondary | ICD-10-CM | POA: Diagnosis not present

## 2017-02-01 DIAGNOSIS — M0579 Rheumatoid arthritis with rheumatoid factor of multiple sites without organ or systems involvement: Secondary | ICD-10-CM | POA: Diagnosis not present

## 2017-02-01 DIAGNOSIS — S56511A Strain of other extensor muscle, fascia and tendon at forearm level, right arm, initial encounter: Secondary | ICD-10-CM | POA: Diagnosis not present

## 2017-02-01 DIAGNOSIS — T8484XA Pain due to internal orthopedic prosthetic devices, implants and grafts, initial encounter: Secondary | ICD-10-CM | POA: Diagnosis not present

## 2017-02-01 DIAGNOSIS — Z681 Body mass index (BMI) 19 or less, adult: Secondary | ICD-10-CM | POA: Diagnosis not present

## 2017-02-01 NOTE — Progress Notes (Signed)
PCP: Dr. Arrie Aran @ [facility] in [facility]  Rheumatology: Dr. Leafy Kindle or Berna Bue @ [facility]---requested lab work to be faxed over. Pt. Stated she had labs drawn this am.

## 2017-02-02 ENCOUNTER — Ambulatory Visit (HOSPITAL_COMMUNITY): Payer: 59 | Admitting: Certified Registered Nurse Anesthetist

## 2017-02-02 ENCOUNTER — Encounter (HOSPITAL_COMMUNITY)
Admission: RE | Disposition: A | Discharge status: Home / Self Care | Payer: Self-pay | Source: Ambulatory Visit | Attending: Orthopedic Surgery

## 2017-02-02 ENCOUNTER — Encounter (HOSPITAL_COMMUNITY): Payer: Self-pay | Admitting: Orthopedic Surgery

## 2017-02-02 ENCOUNTER — Ambulatory Visit (HOSPITAL_COMMUNITY)
Admission: RE | Admit: 2017-02-02 | Discharge: 2017-02-02 | Disposition: A | Discharge status: Home / Self Care | Payer: 59 | Source: Ambulatory Visit | Attending: Orthopedic Surgery | Admitting: Orthopedic Surgery

## 2017-02-02 DIAGNOSIS — Z882 Allergy status to sulfonamides status: Secondary | ICD-10-CM | POA: Insufficient documentation

## 2017-02-02 DIAGNOSIS — M06841 Other specified rheumatoid arthritis, right hand: Secondary | ICD-10-CM | POA: Diagnosis not present

## 2017-02-02 DIAGNOSIS — S56511A Strain of other extensor muscle, fascia and tendon at forearm level, right arm, initial encounter: Secondary | ICD-10-CM | POA: Insufficient documentation

## 2017-02-02 DIAGNOSIS — M66241 Spontaneous rupture of extensor tendons, right hand: Secondary | ICD-10-CM | POA: Diagnosis not present

## 2017-02-02 DIAGNOSIS — Z87891 Personal history of nicotine dependence: Secondary | ICD-10-CM | POA: Insufficient documentation

## 2017-02-02 DIAGNOSIS — T84498A Other mechanical complication of other internal orthopedic devices, implants and grafts, initial encounter: Secondary | ICD-10-CM | POA: Diagnosis not present

## 2017-02-02 DIAGNOSIS — G5601 Carpal tunnel syndrome, right upper limb: Secondary | ICD-10-CM

## 2017-02-02 DIAGNOSIS — M069 Rheumatoid arthritis, unspecified: Secondary | ICD-10-CM | POA: Diagnosis not present

## 2017-02-02 DIAGNOSIS — E2740 Unspecified adrenocortical insufficiency: Secondary | ICD-10-CM | POA: Diagnosis not present

## 2017-02-02 DIAGNOSIS — Z96643 Presence of artificial hip joint, bilateral: Secondary | ICD-10-CM | POA: Insufficient documentation

## 2017-02-02 DIAGNOSIS — G4730 Sleep apnea, unspecified: Secondary | ICD-10-CM | POA: Insufficient documentation

## 2017-02-02 DIAGNOSIS — Y929 Unspecified place or not applicable: Secondary | ICD-10-CM | POA: Insufficient documentation

## 2017-02-02 DIAGNOSIS — T8484XA Pain due to internal orthopedic prosthetic devices, implants and grafts, initial encounter: Secondary | ICD-10-CM | POA: Insufficient documentation

## 2017-02-02 DIAGNOSIS — G8918 Other acute postprocedural pain: Secondary | ICD-10-CM | POA: Diagnosis not present

## 2017-02-02 DIAGNOSIS — Z472 Encounter for removal of internal fixation device: Secondary | ICD-10-CM | POA: Diagnosis not present

## 2017-02-02 DIAGNOSIS — Y793 Surgical instruments, materials and orthopedic devices (including sutures) associated with adverse incidents: Secondary | ICD-10-CM | POA: Insufficient documentation

## 2017-02-02 DIAGNOSIS — X58XXXA Exposure to other specified factors, initial encounter: Secondary | ICD-10-CM | POA: Insufficient documentation

## 2017-02-02 HISTORY — PX: HARDWARE REMOVAL: SHX979

## 2017-02-02 HISTORY — PX: REPAIR EXTENSOR TENDON: SHX5382

## 2017-02-02 LAB — POCT PREGNANCY, URINE: Preg Test, Ur: NEGATIVE

## 2017-02-02 SURGERY — REMOVAL, HARDWARE
Anesthesia: Regional | Laterality: Right

## 2017-02-02 MED ORDER — EPHEDRINE SULFATE 50 MG/ML IJ SOLN
INTRAMUSCULAR | Status: DC | PRN
  Administered 2017-02-02 (×2): 10 mg via INTRAVENOUS

## 2017-02-02 MED ORDER — MIDAZOLAM HCL 2 MG/2ML IJ SOLN
INTRAMUSCULAR | Status: AC
  Filled 2017-02-02: qty 2

## 2017-02-02 MED ORDER — MIDAZOLAM HCL 2 MG/2ML IJ SOLN
1.0000 mg | Freq: Once | INTRAMUSCULAR | Status: AC
  Administered 2017-02-02: 1 mg via INTRAVENOUS

## 2017-02-02 MED ORDER — FENTANYL CITRATE (PF) 250 MCG/5ML IJ SOLN
INTRAMUSCULAR | Status: AC
  Filled 2017-02-02: qty 5

## 2017-02-02 MED ORDER — ONDANSETRON HCL 4 MG/2ML IJ SOLN
INTRAMUSCULAR | Status: DC | PRN
  Administered 2017-02-02: 4 mg via INTRAVENOUS

## 2017-02-02 MED ORDER — FENTANYL CITRATE (PF) 100 MCG/2ML IJ SOLN
50.0000 ug | Freq: Once | INTRAMUSCULAR | Status: AC
  Administered 2017-02-02: 50 ug via INTRAVENOUS

## 2017-02-02 MED ORDER — FENTANYL CITRATE (PF) 100 MCG/2ML IJ SOLN
INTRAMUSCULAR | Status: AC
  Administered 2017-02-02: 50 ug via INTRAVENOUS
  Filled 2017-02-02: qty 2

## 2017-02-02 MED ORDER — BUPIVACAINE HCL (PF) 0.25 % IJ SOLN
INTRAMUSCULAR | Status: AC
  Filled 2017-02-02: qty 30

## 2017-02-02 MED ORDER — CHLORHEXIDINE GLUCONATE 4 % EX LIQD: 60.0000 mL | Freq: Once | CUTANEOUS | Status: DC

## 2017-02-02 MED ORDER — PROPOFOL 10 MG/ML IV BOLUS
INTRAVENOUS | Status: AC
Start: 2017-02-02 — End: ?
  Filled 2017-02-02: qty 20

## 2017-02-02 MED ORDER — PROPOFOL 1000 MG/100ML IV EMUL
INTRAVENOUS | Status: AC
  Filled 2017-02-02: qty 100

## 2017-02-02 MED ORDER — CEFAZOLIN SODIUM-DEXTROSE 2-4 GM/100ML-% IV SOLN
INTRAVENOUS | Status: AC
  Filled 2017-02-02: qty 100

## 2017-02-02 MED ORDER — LACTATED RINGERS IV SOLN
INTRAVENOUS | Status: DC
  Administered 2017-02-02: 14:00:00 via INTRAVENOUS

## 2017-02-02 MED ORDER — MIDAZOLAM HCL 2 MG/2ML IJ SOLN
INTRAMUSCULAR | Status: AC
  Administered 2017-02-02: 1 mg via INTRAVENOUS
  Filled 2017-02-02: qty 2

## 2017-02-02 MED ORDER — CEFAZOLIN SODIUM-DEXTROSE 2-4 GM/100ML-% IV SOLN
2.0000 g | INTRAVENOUS | Status: AC
  Administered 2017-02-02: 2 g via INTRAVENOUS

## 2017-02-02 MED ORDER — 0.9 % SODIUM CHLORIDE (POUR BTL) OPTIME
TOPICAL | Status: DC | PRN
  Administered 2017-02-02: 1000 mL

## 2017-02-02 MED ORDER — PROPOFOL 10 MG/ML IV BOLUS
INTRAVENOUS | Status: DC | PRN
  Administered 2017-02-02: 90 mg via INTRAVENOUS

## 2017-02-02 MED ORDER — DEXAMETHASONE SODIUM PHOSPHATE 10 MG/ML IJ SOLN
INTRAMUSCULAR | Status: DC | PRN
  Administered 2017-02-02: 5 mg via INTRAVENOUS

## 2017-02-02 MED ORDER — PROPOFOL 10 MG/ML IV BOLUS
INTRAVENOUS | Status: AC
  Filled 2017-02-02: qty 20

## 2017-02-02 MED ORDER — ROPIVACAINE HCL 7.5 MG/ML IJ SOLN
INTRAMUSCULAR | Status: DC | PRN
  Administered 2017-02-02: 20 mL via PERINEURAL

## 2017-02-02 SURGICAL SUPPLY — 52 items
BANDAGE ACE 3X5.8 VEL STRL LF (GAUZE/BANDAGES/DRESSINGS) ×2 IMPLANT
BANDAGE ACE 4X5 VEL STRL LF (GAUZE/BANDAGES/DRESSINGS) ×2 IMPLANT
BLADE CLIPPER SURG (BLADE) IMPLANT
BNDG ESMARK 4X9 LF (GAUZE/BANDAGES/DRESSINGS) ×2 IMPLANT
BNDG GAUZE ELAST 4 BULKY (GAUZE/BANDAGES/DRESSINGS) ×2 IMPLANT
CORDS BIPOLAR (ELECTRODE) ×2 IMPLANT
COVER SURGICAL LIGHT HANDLE (MISCELLANEOUS) ×2 IMPLANT
CUFF TOURNIQUET SINGLE 18IN (TOURNIQUET CUFF) ×2 IMPLANT
CUFF TOURNIQUET SINGLE 24IN (TOURNIQUET CUFF) IMPLANT
DRAIN TLS ROUND 10FR (DRAIN) IMPLANT
DRAPE OEC MINIVIEW 54X84 (DRAPES) IMPLANT
DRAPE SURG 17X11 SM STRL (DRAPES) ×2 IMPLANT
DRSG ADAPTIC 3X8 NADH LF (GAUZE/BANDAGES/DRESSINGS) ×2 IMPLANT
ELECT REM PT RETURN 9FT ADLT (ELECTROSURGICAL)
ELECTRODE REM PT RTRN 9FT ADLT (ELECTROSURGICAL) IMPLANT
GAUZE SPONGE 4X4 12PLY STRL (GAUZE/BANDAGES/DRESSINGS) ×2 IMPLANT
GAUZE SPONGE 4X4 16PLY XRAY LF (GAUZE/BANDAGES/DRESSINGS) ×2 IMPLANT
GAUZE XEROFORM 5X9 LF (GAUZE/BANDAGES/DRESSINGS) ×2 IMPLANT
GLOVE BIOGEL PI IND STRL 8.5 (GLOVE) ×1 IMPLANT
GLOVE BIOGEL PI INDICATOR 8.5 (GLOVE) ×1
GLOVE SURG ORTHO 8.0 STRL STRW (GLOVE) ×2 IMPLANT
GOWN STRL REUS W/ TWL LRG LVL3 (GOWN DISPOSABLE) ×3 IMPLANT
GOWN STRL REUS W/ TWL XL LVL3 (GOWN DISPOSABLE) ×1 IMPLANT
GOWN STRL REUS W/TWL LRG LVL3 (GOWN DISPOSABLE) ×3
GOWN STRL REUS W/TWL XL LVL3 (GOWN DISPOSABLE) ×1
KIT BASIN OR (CUSTOM PROCEDURE TRAY) ×2 IMPLANT
KIT ROOM TURNOVER OR (KITS) ×2 IMPLANT
MANIFOLD NEPTUNE II (INSTRUMENTS) ×2 IMPLANT
NEEDLE 22X1 1/2 (OR ONLY) (NEEDLE) IMPLANT
NS IRRIG 1000ML POUR BTL (IV SOLUTION) ×2 IMPLANT
PACK ORTHO EXTREMITY (CUSTOM PROCEDURE TRAY) ×2 IMPLANT
PAD ARMBOARD 7.5X6 YLW CONV (MISCELLANEOUS) ×4 IMPLANT
PAD CAST 4YDX4 CTTN HI CHSV (CAST SUPPLIES) ×1 IMPLANT
PADDING CAST COTTON 4X4 STRL (CAST SUPPLIES) ×1
SOAP 2 % CHG 4 OZ (WOUND CARE) ×2 IMPLANT
SPLINT FIBERGLASS 3X35 (CAST SUPPLIES) ×2 IMPLANT
SPONGE LAP 4X18 X RAY DECT (DISPOSABLE) ×2 IMPLANT
STRIP CLOSURE SKIN 1/2X4 (GAUZE/BANDAGES/DRESSINGS) ×2 IMPLANT
SUCTION FRAZIER HANDLE 10FR (MISCELLANEOUS) ×1
SUCTION TUBE FRAZIER 10FR DISP (MISCELLANEOUS) ×1 IMPLANT
SUT ETHILON 4 0 PS 2 18 (SUTURE) IMPLANT
SUT FIBERWIRE 4-0 18 TAPR NDL (SUTURE) ×2
SUT MNCRL AB 4-0 PS2 18 (SUTURE) IMPLANT
SUT VIC AB 3-0 FS2 27 (SUTURE) IMPLANT
SUTURE FIBERWR 4-0 18 TAPR NDL (SUTURE) ×1 IMPLANT
SYR CONTROL 10ML LL (SYRINGE) IMPLANT
SYSTEM CHEST DRAIN TLS 7FR (DRAIN) IMPLANT
TOWEL OR 17X24 6PK STRL BLUE (TOWEL DISPOSABLE) ×2 IMPLANT
TOWEL OR 17X26 10 PK STRL BLUE (TOWEL DISPOSABLE) ×2 IMPLANT
TUBE CONNECTING 12X1/4 (SUCTIONS) ×2 IMPLANT
WATER STERILE IRR 1000ML POUR (IV SOLUTION) ×2 IMPLANT
YANKAUER SUCT BULB TIP NO VENT (SUCTIONS) IMPLANT

## 2017-02-02 NOTE — Discharge Instructions (Signed)
KEEP BANDAGE CLEAN AND DRY CALL OFFICE FOR F/U APPT [phone] IN 14 DAYS KEEP HAND ELEVATED ABOVE HEART OK TO APPLY ICE TO OPERATIVE AREA CONTACT OFFICE IF ANY WORSENING PAIN OR CONCERNS. 

## 2017-02-02 NOTE — Op Note (Signed)
PREOPERATIVE DIAGNOSIS: Right wrist rheumatoid arthritis with retained hardware and tendon rupture  POSTOPERATIVE DIAGNOSIS: Same  ATTENDING SURGEON: Dr. Iran Planas who was scrubbed and present for the entire procedure  ASSISTANT SURGEON: Gertie Fey PA-C who was scrubbed and necessary for the entire procedure without aid and hardware removal tenolysis and tendon transfer  ANESTHESIA: Block with general anesthesia  OPERATIVE PROCEDURE: #1: Right small finger EDC to ring finger EDC tendon transfer, side to side tendon transfer #2: Right EDC tenolysis dorsum of hand #3: Right extensor indices proprius tenolysis #4: Removal of deep implant buried plates and screws right wrist #5: Radiographs 3 views right wrist  IMPLANTS: None  RADIOGRAPHIC INTERPRETATION: AP lateral and oblique views of the wrist do show removal of the dorsal plate fixation with a solid arthrodesis across the radiocarpal joint  SURGICAL INDICATIONS: The patient is a right-hand dominant female with long-standing rheumatoid arthritis.  Patient had undergone wrist arthrodesis years ago.  Patient was having difficulty extending her ring and small fingers.  Patient was recommended to undergo the above procedure.  Risk benefits and alternatives were discussed in detail with the patient and a signed informed consent was obtained.  Risks include but not limited to bleeding infection damage to nearby nerves or arteries or tendons loss of motion of the wrist and digits incomplete relief of symptoms and need for further surgical intervention.  SURGICAL TECHNIQUE: The patient was properly identified in the preoperative holding area and marked with a permanent marker made on the right wrist indicate the correct operative site.  The patient was then brought back to the operating room placed supine on the anesthesia room table where general anesthesia was administered.  Patient tolerated this well.  A well-padded tourniquet was then  placed on the right brachium and stay with the appropriate drape.  The right upper extremity was then prepped and draped in normal sterile fashion.  Preoperative antibiotics were given prior to skin incision.  Sterile draping occurred.  A timeout was called the correct site was identified and the procedure then begun.  The previous longitudinal incision was then made directly over the dorsal aspect of the wrist.  Dissection carried down through the skin and subcutaneous tissues.  The extensor tendons were identified distally.  The retinaculum over the fourth dorsal compartment was then carefully incised exposing the fourth dorsal compartment tendons.  There was market adhesions between the plate distally and the EDC and EIP to the index as well as EDC to the long and ring.  The patient did have a tendon rupture of the EDC to the small finger as well as EDQ to the small finger.  The ring long and index finger extensor tendons were in continuity.  The EPL was in continuity.  After the tendons were then carefully identified the plate was then carefully dissected free and then removed without any complicating features.  The screws and plates were all removed.  Radiographs were then obtained which showed the arthrodesis in good position and solid.  Once this was identified aggressive Otila Kluver lysis was then carried out remove from the adherent tissue.  Tenolysis was done of the [facility].  Tina lysis was also done of the EIP.  Careful freeing up of attendance was able to mobilize the tendons.  This allowed for tendon transfer.  The EDC to the small finger was then transferred to the [facility] to the ring finger side to side tendon transfer was then sewn in place with 4-0 FiberWire suture.  The wound  was then thoroughly irrigated.  After tendon transfer the retinaculum was then closed with 2-0 Vicryl suture.  The subcutaneous tissues closed with 4-0 Monocryl.  Skin closed with running 4-0 and 3-0 Prolene suture.  Xeroform dressing and a  sterile compressive bandage was then applied.  The tourniquet had been deflated with good perfusion of the hand.  The patient was then placed in a well-padded volar splint keeping the fingers in full extension.  Patient was then extubated taken recovery room in good condition.  POSTOPERATIVE PLAN: Patient be seen back in the office in approximately 10-12 days for wound check and possible suture removal.  Then begin an outpatient therapy regimen for tendon transfer of the extensor on the dorsum of the hand.  No radiographs of the first visit.

## 2017-02-02 NOTE — Anesthesia Preprocedure Evaluation (Signed)
Anesthesia Evaluation  Patient identified by MRN, date of birth, ID band Patient awake    Reviewed: Allergy & Precautions, NPO status , Patient's Chart, lab work & pertinent test results  Airway Mallampati: II  TM Distance: >3 FB Neck ROM: Full    Dental no notable dental hx.    Pulmonary neg pulmonary ROS, former smoker,    Pulmonary exam normal breath sounds clear to auscultation       Cardiovascular negative cardio ROS Normal cardiovascular exam Rhythm:Regular Rate:Normal     Neuro/Psych negative neurological ROS  negative psych ROS   GI/Hepatic negative GI ROS, Neg liver ROS,   Endo/Other  negative endocrine ROS  Renal/GU negative Renal ROS  negative genitourinary   Musculoskeletal negative musculoskeletal ROS (+)   Abdominal   Peds  Hematology negative hematology ROS (+)   Anesthesia Other Findings   Reproductive/Obstetrics negative OB ROS                             Anesthesia Physical Anesthesia Plan  ASA: II  Anesthesia Plan: General and Regional   Post-op Pain Management: GA combined w/ Regional for post-op pain   Induction: Intravenous  PONV Risk Score and Plan: 3 and Treatment may vary due to age or medical condition, Ondansetron and Dexamethasone  Airway Management Planned: LMA  Additional Equipment:   Intra-op Plan:   Post-operative Plan:   Informed Consent: I have reviewed the patients History and Physical, chart, labs and discussed the procedure including the risks, benefits and alternatives for the proposed anesthesia with the patient or authorized representative who has indicated his/her understanding and acceptance.   Dental advisory given  Plan Discussed with: CRNA and Anesthesiologist  Anesthesia Plan Comments:         Anesthesia Quick Evaluation

## 2017-02-02 NOTE — Transfer of Care (Signed)
Immediate Anesthesia Transfer of Care Note  Patient: Anne Garrett  Procedure(s) Performed: RIGHT WRIST DEEP IMPLANT REMOVAL, RIGHT WRIST AND HAND EXTENSOR TENDON RECONSTRUCTION, TENDON TRANSFER AND RECONSTRUCTION AS INDICATED (Right ) REPAIR EXTENSOR TENDON (Right )  Patient Location: PACU  Anesthesia Type:GA combined with regional for post-op pain  Level of Consciousness: awake, alert , oriented and patient cooperative  Airway & Oxygen Therapy: Patient Spontanous Breathing  Post-op Assessment: Report given to RN and Post -op Vital signs reviewed and stable  Post vital signs: Reviewed and stable  Last Vitals:  Vitals:   02/02/17 1313 02/02/17 1718  BP: 136/89 139/86  Pulse: 84 95  Resp: 20 (!) 27  Temp: 36.9 C 36.8 C  SpO2: 98% 100%    Last Pain:  Vitals:   02/02/17 1313  TempSrc: Oral         Complications: No apparent anesthesia complications

## 2017-02-02 NOTE — Anesthesia Procedure Notes (Signed)
Procedure Name: LMA Insertion Date/Time: 02/02/2017 3:37 PM Performed by: White, Amedeo Plenty, CRNA Pre-anesthesia Checklist: Patient identified and Emergency Drugs available Patient Re-evaluated:Patient Re-evaluated prior to induction Oxygen Delivery Method: Circle System Utilized Preoxygenation: Pre-oxygenation with 100% oxygen Induction Type: IV induction Ventilation: Mask ventilation without difficulty LMA: LMA inserted LMA Size: 3.0 Number of attempts: 1 Airway Equipment and Method: Bite block Placement Confirmation: positive ETCO2 Tube secured with: Tape Dental Injury: Teeth and Oropharynx as per pre-operative assessment

## 2017-02-02 NOTE — Anesthesia Postprocedure Evaluation (Signed)
Anesthesia Post Note  Patient: Anne Garrett  Procedure(s) Performed: RIGHT WRIST DEEP IMPLANT REMOVAL, RIGHT WRIST AND HAND EXTENSOR TENDON RECONSTRUCTION, TENDON TRANSFER AND RECONSTRUCTION AS INDICATED (Right ) REPAIR EXTENSOR TENDON (Right )     Patient location during evaluation: PACU Anesthesia Type: Regional and General Level of consciousness: awake and alert, patient cooperative and oriented Pain management: pain level controlled Vital Signs Assessment: post-procedure vital signs reviewed and stable Respiratory status: spontaneous breathing, nonlabored ventilation and respiratory function stable Cardiovascular status: blood pressure returned to baseline and stable Postop Assessment: no apparent nausea or vomiting Anesthetic complications: no    Last Vitals:  Vitals:   02/02/17 1733 02/02/17 1748  BP: 122/77 111/78  Pulse: 81 85  Resp: (!) 21 20  Temp:  36.9 C  SpO2: 99% 99%    Last Pain:  Vitals:   02/02/17 1748  TempSrc:   PainSc: 0-No pain                 Renea Schoonmaker,E. Hasel Janish

## 2017-02-02 NOTE — Anesthesia Procedure Notes (Signed)
Anesthesia Regional Block: Supraclavicular block   Pre-Anesthetic Checklist: ,, timeout performed, Correct Patient, Correct Site, Correct Laterality, Correct Procedure, Correct Position, site marked, Risks and benefits discussed,  Surgical consent,  Pre-op evaluation,  At surgeon's request and post-op pain management  Laterality: Right  Prep: chloraprep       Needles:  Injection technique: Single-shot  Needle Type: Echogenic Stimulator Needle     Needle Length: 5cm  Needle Gauge: 21     Additional Needles:   Procedures:,,,, ultrasound used (permanent image in chart),,,,  Narrative:  Start time: 02/02/2017 2:55 PM End time: 02/02/2017 3:00 PM Injection made incrementally with aspirations every 5 mL.  Performed by: Personally  Anesthesiologist: Barnet Glasgow, MD

## 2017-02-02 NOTE — Progress Notes (Signed)
Orthopedic Tech Progress Note Patient Details:  Anne Garrett 01-Jun-1975 [id number]  Ortho Devices Type of Ortho Device: Arm sling Ortho Device/Splint Location: RUE Ortho Device/Splint Interventions: Ordered, Application   Post Interventions Patient Tolerated: Well Instructions Provided: Care of device   Braulio Bosch 02/02/2017, 6:14 PM

## 2017-02-03 ENCOUNTER — Encounter (HOSPITAL_COMMUNITY): Payer: Self-pay | Admitting: Orthopedic Surgery

## 2017-02-14 ENCOUNTER — Ambulatory Visit (HOSPITAL_BASED_OUTPATIENT_CLINIC_OR_DEPARTMENT_OTHER): Payer: 59 | Admitting: Pharmacist

## 2017-02-14 ENCOUNTER — Other Ambulatory Visit: Payer: Self-pay | Admitting: Pharmacist

## 2017-02-14 DIAGNOSIS — Z79899 Other long term (current) drug therapy: Secondary | ICD-10-CM

## 2017-02-14 MED ORDER — TOFACITINIB CITRATE 5 MG PO TABS: ORAL_TABLET | ORAL | 3 refills | Status: DC

## 2017-02-14 MED FILL — XELJANZ 5 MG TABS: 5 | 30 days supply | Qty: 60 | Fill #0

## 2017-02-14 NOTE — Progress Notes (Signed)
   S: Patient presents today to the [facility].  Patient is currently taking Xeljanz for rheumatoid arthritis. Patient is managed by Leafy Kindle for this.   Adherence: denies any missed doses  Efficacy: working well now that she is on immediate release. She was on the XR but the effect did not last all day.   Dosing:  5 mg BID   Monitoring: S/sx of infection: denies S/sx of malignancy: denies Bone marrow suppression: CBC 2018 WNL GI adverse effects: denies Headache: denies LFTs: WNL Cardiovascular effects (decreased HR): denies   O:     Lab Results  Component Value Date   WBC 5.6 06/02/2016   HGB 12.9 06/02/2016   HCT 40.7 06/02/2016   MCV 102.5 (H) 06/02/2016   PLT 329 06/02/2016      Chemistry      Component Value Date/Time   NA 138 06/02/2016 1352   K 3.9 06/02/2016 1352   CL 105 06/02/2016 1352   CO2 24 06/02/2016 1352   BUN <5 (L) 06/02/2016 1352   CREATININE 0.42 (L) 06/02/2016 1352      Component Value Date/Time   CALCIUM 9.3 06/02/2016 1352   ALKPHOS 87 03/31/2016 0842   AST 16 03/31/2016 0842   ALT 10 (L) 03/31/2016 0842   BILITOT 0.2 (L) 03/31/2016 0842       A/P: 1. Medication review: patient currently taking Morrie Sheldon for rheumatoid arthrits and is tolerating it well with no adverse effects and has even more improvement with immediate release formulation. Reviewed the medications with the patient including the following: Xeljanz (tofacitinib) prevents inflammation. Adverse effects include increased risk of infection, risk of malignancy, as well as bone marrow suppression, GI upset, and decreased heart rate. . Patient should avoid live vaccinations while on this medication. No recommendations for changes at this time   Christella Hartigan, PharmD, BCPS, [place], [facility] [phone]

## 2017-02-17 DIAGNOSIS — M25641 Stiffness of right hand, not elsewhere classified: Secondary | ICD-10-CM | POA: Diagnosis not present

## 2017-02-25 MED FILL — BUTALB-ACETAMIN-CAFF 50-325: 50-325-40 | 5 days supply | Qty: 30 | Fill #0

## 2017-03-04 DIAGNOSIS — M25649 Stiffness of unspecified hand, not elsewhere classified: Secondary | ICD-10-CM | POA: Diagnosis not present

## 2017-03-04 DIAGNOSIS — M25641 Stiffness of right hand, not elsewhere classified: Secondary | ICD-10-CM | POA: Diagnosis not present

## 2017-03-08 MED FILL — BUTALB-ACETAMIN-CAFF 50-325: 50-325-40 | 5 days supply | Qty: 30 | Fill #1

## 2017-03-11 DIAGNOSIS — M25641 Stiffness of right hand, not elsewhere classified: Secondary | ICD-10-CM | POA: Diagnosis not present

## 2017-03-11 DIAGNOSIS — Z4789 Encounter for other orthopedic aftercare: Secondary | ICD-10-CM | POA: Diagnosis not present

## 2017-03-16 MED FILL — BUTALB-ACETAMIN-CAFF 50-325: 50-325-40 | 5 days supply | Qty: 30 | Fill #2

## 2017-03-16 MED FILL — XELJANZ 5 MG TABS: 5 | 30 days supply | Qty: 60 | Fill #1

## 2017-03-17 MED FILL — busPIRone HCL 5 MG TABS: 5 | 30 days supply | Qty: 90 | Fill #1

## 2017-03-17 MED FILL — VIT D3-50 50,000 UNITS CAPS: 1.25 MG | 60 days supply | Qty: 2 | Fill #0

## 2017-03-23 MED FILL — BUTALB-ACETAMIN-CAFF 50-325: 50-325-40 | 5 days supply | Qty: 30 | Fill #3

## 2017-03-28 DIAGNOSIS — N951 Menopausal and female climacteric states: Secondary | ICD-10-CM | POA: Diagnosis not present

## 2017-03-28 DIAGNOSIS — Z23 Encounter for immunization: Secondary | ICD-10-CM | POA: Diagnosis not present

## 2017-03-30 MED FILL — BUTALB-ACETAMIN-CAFF 50-325: 50-325-40 | 5 days supply | Qty: 30 | Fill #4

## 2017-04-05 MED FILL — predniSONE 5 MG TABS: 5 | 90 days supply | Qty: 180 | Fill #0

## 2017-04-05 MED FILL — metroNIDAZOLE 500 MG TABS: 500 | 7 days supply | Qty: 14 | Fill #0

## 2017-04-05 MED FILL — BUTALB-ACETAMIN-CAFF 50-325: 50-325-40 | 5 days supply | Qty: 30 | Fill #5

## 2017-04-13 MED FILL — XELJANZ 5 MG TABS: 5 | 30 days supply | Qty: 60 | Fill #2

## 2017-04-13 MED FILL — BUTALB-ACETAMIN-CAFF 50-325: 50-325-40 | 5 days supply | Qty: 30 | Fill #6

## 2017-04-20 MED FILL — BUTALB-ACETAMIN-CAFF 50-325: 50-325-40 | 5 days supply | Qty: 30 | Fill #7

## 2017-04-22 MED FILL — IBANDRONATE NA 150 MG TAB: 150 | 90 days supply | Qty: 3 | Fill #3

## 2017-04-25 MED FILL — NITROFURANTOIN MONO-MCR 100: 100 | 3 days supply | Qty: 6 | Fill #0

## 2017-04-26 MED FILL — BUTALB-ACETAMIN-CAFF 50-325: 50-325-40 | 5 days supply | Qty: 30 | Fill #8

## 2017-05-06 MED FILL — BUTALB-ACETAMIN-CAFF 50-325: 50-325-40 | 5 days supply | Qty: 30 | Fill #9

## 2017-05-12 DIAGNOSIS — Z79899 Other long term (current) drug therapy: Secondary | ICD-10-CM | POA: Diagnosis not present

## 2017-05-12 DIAGNOSIS — M2550 Pain in unspecified joint: Secondary | ICD-10-CM | POA: Diagnosis not present

## 2017-05-12 DIAGNOSIS — M150 Primary generalized (osteo)arthritis: Secondary | ICD-10-CM | POA: Diagnosis not present

## 2017-05-12 DIAGNOSIS — M0579 Rheumatoid arthritis with rheumatoid factor of multiple sites without organ or systems involvement: Secondary | ICD-10-CM | POA: Diagnosis not present

## 2017-05-12 DIAGNOSIS — Z681 Body mass index (BMI) 19 or less, adult: Secondary | ICD-10-CM | POA: Diagnosis not present

## 2017-05-12 DIAGNOSIS — M810 Age-related osteoporosis without current pathological fracture: Secondary | ICD-10-CM | POA: Diagnosis not present

## 2017-05-16 MED FILL — BUTALB-ACETAMIN-CAFF 50-325: 50-325-40 | 5 days supply | Qty: 30 | Fill #10

## 2017-05-16 MED FILL — clonazePAM 1 MG TABS: 1 | 30 days supply | Qty: 30 | Fill #1

## 2017-05-16 MED FILL — XELJANZ 5 MG TABS: 5 | 30 days supply | Qty: 60 | Fill #3

## 2017-05-24 MED FILL — BUTALB-ACETAMIN-CAFF 50-325: 50-325-40 | 5 days supply | Qty: 30 | Fill #11

## 2017-06-13 ENCOUNTER — Other Ambulatory Visit: Payer: Self-pay | Admitting: Internal Medicine

## 2017-06-13 MED ORDER — TOFACITINIB CITRATE 5 MG PO TABS: ORAL_TABLET | ORAL | 3 refills | Status: DC

## 2017-06-13 MED FILL — XELJANZ 5 MG TABS: 5 | 30 days supply | Qty: 60 | Fill #0

## 2017-06-21 MED FILL — METHOCARBAMOL 500 MG TABS: 500 | 30 days supply | Qty: 90 | Fill #0

## 2017-06-28 DIAGNOSIS — M5416 Radiculopathy, lumbar region: Secondary | ICD-10-CM | POA: Diagnosis not present

## 2017-06-28 DIAGNOSIS — M5136 Other intervertebral disc degeneration, lumbar region: Secondary | ICD-10-CM | POA: Diagnosis not present

## 2017-06-28 DIAGNOSIS — M47816 Spondylosis without myelopathy or radiculopathy, lumbar region: Secondary | ICD-10-CM | POA: Diagnosis not present

## 2017-06-28 DIAGNOSIS — Z981 Arthrodesis status: Secondary | ICD-10-CM | POA: Diagnosis not present

## 2017-07-12 MED FILL — XELJANZ 5 MG TABS: 5 | 30 days supply | Qty: 60 | Fill #1

## 2017-07-20 MED FILL — IBANDRONATE NA 150 MG TAB: 150 | 90 days supply | Qty: 3 | Fill #0

## 2017-07-22 DIAGNOSIS — Z23 Encounter for immunization: Secondary | ICD-10-CM | POA: Diagnosis not present

## 2017-08-09 MED FILL — XELJANZ 5 MG TABS: 5 | 30 days supply | Qty: 60 | Fill #2

## 2017-08-11 MED FILL — METHOCARBAMOL 500 MG TABS: 500 | 30 days supply | Qty: 90 | Fill #0

## 2017-08-15 DIAGNOSIS — Z79899 Other long term (current) drug therapy: Secondary | ICD-10-CM | POA: Diagnosis not present

## 2017-08-15 DIAGNOSIS — Z6822 Body mass index (BMI) 22.0-22.9, adult: Secondary | ICD-10-CM | POA: Diagnosis not present

## 2017-08-15 DIAGNOSIS — M150 Primary generalized (osteo)arthritis: Secondary | ICD-10-CM | POA: Diagnosis not present

## 2017-08-15 DIAGNOSIS — M0579 Rheumatoid arthritis with rheumatoid factor of multiple sites without organ or systems involvement: Secondary | ICD-10-CM | POA: Diagnosis not present

## 2017-08-15 DIAGNOSIS — M2550 Pain in unspecified joint: Secondary | ICD-10-CM | POA: Diagnosis not present

## 2017-08-15 DIAGNOSIS — Z681 Body mass index (BMI) 19 or less, adult: Secondary | ICD-10-CM | POA: Diagnosis not present

## 2017-08-15 DIAGNOSIS — M810 Age-related osteoporosis without current pathological fracture: Secondary | ICD-10-CM | POA: Diagnosis not present

## 2017-08-15 MED FILL — predniSONE 5 MG TABS: 5 | 90 days supply | Qty: 180 | Fill #0

## 2017-08-24 ENCOUNTER — Other Ambulatory Visit: Payer: Self-pay | Admitting: Neurosurgery

## 2017-08-24 DIAGNOSIS — M47816 Spondylosis without myelopathy or radiculopathy, lumbar region: Secondary | ICD-10-CM

## 2017-08-26 ENCOUNTER — Other Ambulatory Visit: Payer: Self-pay | Admitting: Pharmacist

## 2017-08-26 MED ORDER — TOFACITINIB CITRATE 5 MG PO TABS: ORAL_TABLET | ORAL | 3 refills | Status: DC

## 2017-08-30 ENCOUNTER — Ambulatory Visit
Admission: RE | Admit: 2017-08-30 | Discharge: 2017-08-30 | Disposition: A | Discharge status: Home / Self Care | Payer: 59 | Source: Ambulatory Visit | Attending: Neurosurgery | Admitting: Neurosurgery

## 2017-08-30 DIAGNOSIS — M47816 Spondylosis without myelopathy or radiculopathy, lumbar region: Secondary | ICD-10-CM | POA: Diagnosis not present

## 2017-08-30 DIAGNOSIS — M5126 Other intervertebral disc displacement, lumbar region: Secondary | ICD-10-CM | POA: Diagnosis not present

## 2017-08-30 DIAGNOSIS — M5116 Intervertebral disc disorders with radiculopathy, lumbar region: Secondary | ICD-10-CM | POA: Diagnosis not present

## 2017-08-30 DIAGNOSIS — M48061 Spinal stenosis, lumbar region without neurogenic claudication: Secondary | ICD-10-CM | POA: Diagnosis not present

## 2017-09-01 DIAGNOSIS — M4316 Spondylolisthesis, lumbar region: Secondary | ICD-10-CM | POA: Diagnosis not present

## 2017-09-01 DIAGNOSIS — M5416 Radiculopathy, lumbar region: Secondary | ICD-10-CM | POA: Diagnosis not present

## 2017-09-01 DIAGNOSIS — M48062 Spinal stenosis, lumbar region with neurogenic claudication: Secondary | ICD-10-CM | POA: Diagnosis not present

## 2017-09-01 DIAGNOSIS — M4726 Other spondylosis with radiculopathy, lumbar region: Secondary | ICD-10-CM | POA: Diagnosis not present

## 2017-09-01 DIAGNOSIS — Z981 Arthrodesis status: Secondary | ICD-10-CM | POA: Diagnosis not present

## 2017-09-01 DIAGNOSIS — M5136 Other intervertebral disc degeneration, lumbar region: Secondary | ICD-10-CM | POA: Diagnosis not present

## 2017-09-12 MED FILL — XELJANZ 5 MG TABS: 5 | 30 days supply | Qty: 60 | Fill #3

## 2017-09-14 DIAGNOSIS — K0500 Acute gingivitis, plaque induced: Secondary | ICD-10-CM | POA: Diagnosis not present

## 2017-09-14 DIAGNOSIS — Z1281 Encounter for screening for malignant neoplasm of oral cavity: Secondary | ICD-10-CM | POA: Diagnosis not present

## 2017-09-14 DIAGNOSIS — G4733 Obstructive sleep apnea (adult) (pediatric): Secondary | ICD-10-CM | POA: Diagnosis not present

## 2017-09-14 MED FILL — clonazePAM 1 MG TABS: 1 | 45 days supply | Qty: 45 | Fill #0

## 2017-09-27 ENCOUNTER — Telehealth: Payer: 59 | Admitting: Family

## 2017-09-27 DIAGNOSIS — R300 Dysuria: Secondary | ICD-10-CM

## 2017-09-27 MED ORDER — NITROFURANTOIN MONOHYD MACRO 100 MG PO CAPS: 100.0000 mg | ORAL_CAPSULE | Freq: Two times a day (BID) | ORAL | 0 refills | Status: DC

## 2017-09-27 MED FILL — NITROFURANTOIN MONO-MCR 100: 100 | 5 days supply | Qty: 10 | Fill #0

## 2017-09-27 NOTE — Progress Notes (Signed)

## 2017-09-29 ENCOUNTER — Telehealth: Payer: 59 | Admitting: Physician Assistant

## 2017-09-29 DIAGNOSIS — B9689 Other specified bacterial agents as the cause of diseases classified elsewhere: Secondary | ICD-10-CM

## 2017-09-29 DIAGNOSIS — N760 Acute vaginitis: Secondary | ICD-10-CM | POA: Diagnosis not present

## 2017-09-29 MED ORDER — METRONIDAZOLE 500 MG PO TABS: 500.0000 mg | ORAL_TABLET | Freq: Two times a day (BID) | ORAL | 0 refills | Status: DC

## 2017-09-29 NOTE — Progress Notes (Signed)

## 2017-10-10 ENCOUNTER — Other Ambulatory Visit: Payer: Self-pay | Admitting: Pharmacist

## 2017-10-10 MED ORDER — TOFACITINIB CITRATE 5 MG PO TABS: ORAL_TABLET | ORAL | 3 refills | Status: DC

## 2017-10-10 MED FILL — METHOCARBAMOL 500 MG TABS: 500 | 30 days supply | Qty: 90 | Fill #1

## 2017-10-10 MED FILL — XELJANZ 5 MG TABS: 5 | 30 days supply | Qty: 60 | Fill #0

## 2017-10-25 DIAGNOSIS — M546 Pain in thoracic spine: Secondary | ICD-10-CM | POA: Diagnosis not present

## 2017-10-25 DIAGNOSIS — M5416 Radiculopathy, lumbar region: Secondary | ICD-10-CM | POA: Diagnosis not present

## 2017-10-25 DIAGNOSIS — Z01419 Encounter for gynecological examination (general) (routine) without abnormal findings: Secondary | ICD-10-CM | POA: Diagnosis not present

## 2017-10-25 DIAGNOSIS — M48062 Spinal stenosis, lumbar region with neurogenic claudication: Secondary | ICD-10-CM | POA: Diagnosis not present

## 2017-10-25 DIAGNOSIS — M791 Myalgia, unspecified site: Secondary | ICD-10-CM | POA: Diagnosis not present

## 2017-10-31 DIAGNOSIS — M810 Age-related osteoporosis without current pathological fracture: Secondary | ICD-10-CM | POA: Diagnosis not present

## 2017-10-31 DIAGNOSIS — R635 Abnormal weight gain: Secondary | ICD-10-CM | POA: Diagnosis not present

## 2017-10-31 DIAGNOSIS — F411 Generalized anxiety disorder: Secondary | ICD-10-CM | POA: Diagnosis not present

## 2017-10-31 DIAGNOSIS — N3281 Overactive bladder: Secondary | ICD-10-CM | POA: Diagnosis not present

## 2017-10-31 DIAGNOSIS — R51 Headache: Secondary | ICD-10-CM | POA: Diagnosis not present

## 2017-10-31 MED FILL — BUTALB-ACETAMIN-CAFF 50-325: 50-325-40 | 5 days supply | Qty: 30 | Fill #0

## 2017-10-31 MED FILL — IBANDRONATE NA 150 MG TAB: 150 | 29 days supply | Qty: 1 | Fill #1

## 2017-11-01 LAB — TSH: TSH: 0.67 (ref <=5.90)

## 2017-11-01 MED FILL — OXYBUTYNIN 5 MG TABLET: 5 | 30 days supply | Qty: 30 | Fill #0

## 2017-11-07 MED FILL — FLUCONAZOLE 150 MG TABS: 150 | 2 days supply | Qty: 2 | Fill #0

## 2017-11-11 MED FILL — XELJANZ 5 MG TABS: 5 | 30 days supply | Qty: 60 | Fill #1

## 2017-11-11 MED FILL — NITROFURANTOIN MONO-MCR 100: 100 | 7 days supply | Qty: 14 | Fill #0

## 2017-11-11 MED FILL — metroNIDAZOLE 500 MG TABS: 500 | 7 days supply | Qty: 14 | Fill #0

## 2017-11-22 DIAGNOSIS — M2550 Pain in unspecified joint: Secondary | ICD-10-CM | POA: Diagnosis not present

## 2017-11-22 DIAGNOSIS — M0579 Rheumatoid arthritis with rheumatoid factor of multiple sites without organ or systems involvement: Secondary | ICD-10-CM | POA: Diagnosis not present

## 2017-11-22 DIAGNOSIS — Z6823 Body mass index (BMI) 23.0-23.9, adult: Secondary | ICD-10-CM | POA: Diagnosis not present

## 2017-11-22 DIAGNOSIS — M810 Age-related osteoporosis without current pathological fracture: Secondary | ICD-10-CM | POA: Diagnosis not present

## 2017-11-22 DIAGNOSIS — M150 Primary generalized (osteo)arthritis: Secondary | ICD-10-CM | POA: Diagnosis not present

## 2017-11-22 DIAGNOSIS — Z79899 Other long term (current) drug therapy: Secondary | ICD-10-CM | POA: Diagnosis not present

## 2017-11-23 DIAGNOSIS — H5213 Myopia, bilateral: Secondary | ICD-10-CM | POA: Diagnosis not present

## 2017-11-23 MED FILL — IBANDRONATE NA 150 MG TAB: 150 | 90 days supply | Qty: 3 | Fill #0

## 2017-12-05 DIAGNOSIS — M5416 Radiculopathy, lumbar region: Secondary | ICD-10-CM | POA: Diagnosis not present

## 2017-12-12 MED FILL — XELJANZ 5 MG TABS: 5 | 30 days supply | Qty: 60 | Fill #2

## 2017-12-14 DIAGNOSIS — N939 Abnormal uterine and vaginal bleeding, unspecified: Secondary | ICD-10-CM | POA: Diagnosis not present

## 2017-12-14 MED FILL — MYRBETRIQ ER 25 MG TABLET: 25 | 30 days supply | Qty: 30 | Fill #0

## 2017-12-25 ENCOUNTER — Telehealth: Payer: 59 | Admitting: Physician Assistant

## 2017-12-25 DIAGNOSIS — B9689 Other specified bacterial agents as the cause of diseases classified elsewhere: Secondary | ICD-10-CM

## 2017-12-25 DIAGNOSIS — N760 Acute vaginitis: Secondary | ICD-10-CM

## 2017-12-25 MED ORDER — METRONIDAZOLE 500 MG PO TABS: 500.0000 mg | ORAL_TABLET | Freq: Two times a day (BID) | ORAL | 0 refills | Status: DC

## 2017-12-25 NOTE — Progress Notes (Signed)

## 2017-12-26 MED FILL — BUTALB-ACETAMIN-CAFF 50-325: 50-325-40 | 5 days supply | Qty: 30 | Fill #0

## 2018-01-03 DIAGNOSIS — M791 Myalgia, unspecified site: Secondary | ICD-10-CM | POA: Diagnosis not present

## 2018-01-05 MED FILL — XELJANZ 5 MG TABS: 5 | 30 days supply | Qty: 60 | Fill #3

## 2018-01-09 MED FILL — MYRBETRIQ ER 25 MG TABLET: 25 | 30 days supply | Qty: 30 | Fill #1

## 2018-01-09 MED FILL — predniSONE 5 MG TABS: 5 | 90 days supply | Qty: 180 | Fill #0

## 2018-01-23 DIAGNOSIS — Z124 Encounter for screening for malignant neoplasm of cervix: Secondary | ICD-10-CM | POA: Diagnosis not present

## 2018-01-23 DIAGNOSIS — Z1231 Encounter for screening mammogram for malignant neoplasm of breast: Secondary | ICD-10-CM | POA: Diagnosis not present

## 2018-01-23 DIAGNOSIS — R87610 Atypical squamous cells of undetermined significance on cytologic smear of cervix (ASC-US): Secondary | ICD-10-CM | POA: Diagnosis not present

## 2018-01-23 DIAGNOSIS — Z01419 Encounter for gynecological examination (general) (routine) without abnormal findings: Secondary | ICD-10-CM | POA: Diagnosis not present

## 2018-01-27 MED FILL — BUTALB-ACETAMIN-CAFF 50-325: 50-325-40 | 5 days supply | Qty: 30 | Fill #0

## 2018-02-06 MED FILL — MYRBETRIQ ER 25 MG TABLET: 25 | 90 days supply | Qty: 90 | Fill #0

## 2018-02-08 DIAGNOSIS — M810 Age-related osteoporosis without current pathological fracture: Secondary | ICD-10-CM | POA: Diagnosis not present

## 2018-02-08 DIAGNOSIS — M0579 Rheumatoid arthritis with rheumatoid factor of multiple sites without organ or systems involvement: Secondary | ICD-10-CM | POA: Diagnosis not present

## 2018-02-08 DIAGNOSIS — Z79899 Other long term (current) drug therapy: Secondary | ICD-10-CM | POA: Diagnosis not present

## 2018-02-08 DIAGNOSIS — M25572 Pain in left ankle and joints of left foot: Secondary | ICD-10-CM | POA: Diagnosis not present

## 2018-02-08 DIAGNOSIS — M2550 Pain in unspecified joint: Secondary | ICD-10-CM | POA: Diagnosis not present

## 2018-02-08 DIAGNOSIS — Z6824 Body mass index (BMI) 24.0-24.9, adult: Secondary | ICD-10-CM | POA: Diagnosis not present

## 2018-02-08 DIAGNOSIS — M150 Primary generalized (osteo)arthritis: Secondary | ICD-10-CM | POA: Diagnosis not present

## 2018-02-08 MED FILL — predniSONE 5 MG TABS: 5 | 12 days supply | Qty: 48 | Fill #0

## 2018-02-16 MED FILL — BUTALB-ACETAMIN-CAFF 50-325: 50-325-40 | 5 days supply | Qty: 30 | Fill #0

## 2018-02-23 DIAGNOSIS — S32009K Unspecified fracture of unspecified lumbar vertebra, subsequent encounter for fracture with nonunion: Secondary | ICD-10-CM | POA: Diagnosis not present

## 2018-02-23 DIAGNOSIS — M5440 Lumbago with sciatica, unspecified side: Secondary | ICD-10-CM | POA: Diagnosis not present

## 2018-02-23 DIAGNOSIS — M5136 Other intervertebral disc degeneration, lumbar region: Secondary | ICD-10-CM | POA: Diagnosis not present

## 2018-02-23 MED FILL — METHYLPREDNISOLONE 4 MG TAB: 4 | 6 days supply | Qty: 21 | Fill #0

## 2018-02-27 ENCOUNTER — Other Ambulatory Visit: Payer: Self-pay | Admitting: Neurosurgery

## 2018-02-27 DIAGNOSIS — S32009K Unspecified fracture of unspecified lumbar vertebra, subsequent encounter for fracture with nonunion: Secondary | ICD-10-CM

## 2018-02-28 ENCOUNTER — Ambulatory Visit
Admission: RE | Admit: 2018-02-28 | Discharge: 2018-02-28 | Disposition: A | Discharge status: Home / Self Care | Payer: 59 | Source: Ambulatory Visit | Attending: Neurosurgery | Admitting: Neurosurgery

## 2018-02-28 DIAGNOSIS — S32009K Unspecified fracture of unspecified lumbar vertebra, subsequent encounter for fracture with nonunion: Secondary | ICD-10-CM

## 2018-02-28 DIAGNOSIS — M4807 Spinal stenosis, lumbosacral region: Secondary | ICD-10-CM | POA: Diagnosis not present

## 2018-02-28 DIAGNOSIS — M5126 Other intervertebral disc displacement, lumbar region: Secondary | ICD-10-CM | POA: Diagnosis not present

## 2018-02-28 MED FILL — IBANDRONATE NA 150 MG TAB: 150 | 90 days supply | Qty: 3 | Fill #1 | Status: TO

## 2018-03-08 ENCOUNTER — Other Ambulatory Visit: Payer: Self-pay | Admitting: Pharmacist

## 2018-03-08 MED FILL — BUTALBIT-ACETAMINOPHEN-CAFF: 50-325-40 | 5 days supply | Qty: 30 | Fill #0

## 2018-03-09 DIAGNOSIS — S32009D Unspecified fracture of unspecified lumbar vertebra, subsequent encounter for fracture with routine healing: Secondary | ICD-10-CM | POA: Diagnosis not present

## 2018-03-10 ENCOUNTER — Ambulatory Visit (INDEPENDENT_AMBULATORY_CARE_PROVIDER_SITE_OTHER): Payer: 59 | Admitting: Pharmacist

## 2018-03-10 ENCOUNTER — Encounter: Payer: Self-pay | Admitting: Pharmacist

## 2018-03-10 DIAGNOSIS — Z79899 Other long term (current) drug therapy: Secondary | ICD-10-CM

## 2018-03-10 MED ORDER — ABATACEPT 125 MG/ML ~~LOC~~ SOAJ: 1.0000 "pen " | SUBCUTANEOUS | 0 refills | Status: DC

## 2018-03-10 MED FILL — ORENCIA CLICKJECT 125 MG/ML: 125 | 28 days supply | Qty: 4 | Fill #0 | Status: TO

## 2018-03-10 NOTE — Progress Notes (Signed)
   S: Patient presents to [facility] [place] for review of their specialty medication therapy.  Patient is currently taking Orencia for rheumatoid arthritis. Patient is managed by Leafy Kindle for this. She was previously on [place].  Adherence: has not started yet  FDA-approved dosing: SubQ: 125 mg subQ once weekly.  Dose adjustments: Renal impairment: none Hepatic impairment: none Toxicity: discontinue if serious infection develops  Screenings: TB screening: completed per patient Hepatitis Screening: completed per patient Blood glucose: Orencia contains maltose which make falsely elevate glucose levels  Monitoring: S/sx of infection: denies S/sx of hypersensitivity:has not started Other adverse effects: has not started  O:     Lab Results  Component Value Date   WBC 5.6 06/02/2016   HGB 12.9 06/02/2016   HCT 40.7 06/02/2016   MCV 102.5 (H) 06/02/2016   PLT 329 06/02/2016      Chemistry      Component Value Date/Time   NA 138 06/02/2016 1352   K 3.9 06/02/2016 1352   CL 105 06/02/2016 1352   CO2 24 06/02/2016 1352   BUN <5 (L) 06/02/2016 1352   CREATININE 0.42 (L) 06/02/2016 1352      Component Value Date/Time   CALCIUM 9.3 06/02/2016 1352   ALKPHOS 87 03/31/2016 0842   AST 16 03/31/2016 0842   ALT 10 (L) 03/31/2016 0842   BILITOT 0.2 (L) 03/31/2016 0842       A/P: 1. Medication review: Patient currently on [place] for the treatment of rheumatoid arthritis and is tolerating it rheumatoid arthritis. Reviewed the medication with the patient, including the following: Orencia is a selective T-cell costimulation blocker indicated for rheumatoid arthritis. Patient educated on purpose, proper use and potential adverse effects of Orencia. The most common adverse effects are infections, headache, and injection site reactions. There is a possible adverse effect of increased risk of malignancy but it is not fully understood if this is due to the drug or the disease  state itself. The patient was instructed to avoid use of live vaccinations without the approval of a physician.    Christella Hartigan, PharmD, BCPS, BCACP, CPP Clinical Pharmacist Practitioner  [phone]

## 2018-03-15 DIAGNOSIS — S32009D Unspecified fracture of unspecified lumbar vertebra, subsequent encounter for fracture with routine healing: Secondary | ICD-10-CM | POA: Diagnosis not present

## 2018-03-15 DIAGNOSIS — K644 Residual hemorrhoidal skin tags: Secondary | ICD-10-CM | POA: Diagnosis not present

## 2018-03-24 DIAGNOSIS — S32009K Unspecified fracture of unspecified lumbar vertebra, subsequent encounter for fracture with nonunion: Secondary | ICD-10-CM | POA: Diagnosis not present

## 2018-03-24 DIAGNOSIS — M5136 Other intervertebral disc degeneration, lumbar region: Secondary | ICD-10-CM | POA: Diagnosis not present

## 2018-03-24 DIAGNOSIS — M47816 Spondylosis without myelopathy or radiculopathy, lumbar region: Secondary | ICD-10-CM | POA: Diagnosis not present

## 2018-03-27 MED FILL — predniSONE 5 MG TABS: 5 | 48 days supply | Qty: 48 | Fill #0

## 2018-03-28 MED FILL — ORENCIA CLICKJECT 125 MG/ML: 125 | 28 days supply | Qty: 4 | Fill #0

## 2018-04-05 DIAGNOSIS — S32009D Unspecified fracture of unspecified lumbar vertebra, subsequent encounter for fracture with routine healing: Secondary | ICD-10-CM | POA: Diagnosis not present

## 2018-04-13 ENCOUNTER — Encounter (HOSPITAL_BASED_OUTPATIENT_CLINIC_OR_DEPARTMENT_OTHER): Payer: Self-pay

## 2018-04-13 ENCOUNTER — Ambulatory Visit (HOSPITAL_BASED_OUTPATIENT_CLINIC_OR_DEPARTMENT_OTHER): Admit: 2018-04-13 | Payer: 59 | Admitting: General Surgery

## 2018-04-13 SURGERY — HEMORRHOIDECTOMY
Anesthesia: Monitor Anesthesia Care

## 2018-04-24 DIAGNOSIS — M810 Age-related osteoporosis without current pathological fracture: Secondary | ICD-10-CM | POA: Diagnosis not present

## 2018-04-24 DIAGNOSIS — Z6824 Body mass index (BMI) 24.0-24.9, adult: Secondary | ICD-10-CM | POA: Diagnosis not present

## 2018-04-24 DIAGNOSIS — M150 Primary generalized (osteo)arthritis: Secondary | ICD-10-CM | POA: Diagnosis not present

## 2018-04-24 DIAGNOSIS — M2550 Pain in unspecified joint: Secondary | ICD-10-CM | POA: Diagnosis not present

## 2018-04-24 DIAGNOSIS — M0579 Rheumatoid arthritis with rheumatoid factor of multiple sites without organ or systems involvement: Secondary | ICD-10-CM | POA: Diagnosis not present

## 2018-04-24 DIAGNOSIS — Z79899 Other long term (current) drug therapy: Secondary | ICD-10-CM | POA: Diagnosis not present

## 2018-04-24 DIAGNOSIS — M25572 Pain in left ankle and joints of left foot: Secondary | ICD-10-CM | POA: Diagnosis not present

## 2018-05-01 MED FILL — ORENCIA CLICKJECT 125 MG/ML: 125 | 28 days supply | Qty: 4 | Fill #1

## 2018-05-12 ENCOUNTER — Ambulatory Visit: Payer: Self-pay | Admitting: General Surgery

## 2018-05-15 MED FILL — MYRBETRIQ ER 25 MG TABLET: 25 | 90 days supply | Qty: 90 | Fill #0

## 2018-05-15 MED FILL — BUTALBIT-ACETAMINOPHEN-CAFF: 50-325-40 | 30 days supply | Qty: 30 | Fill #0

## 2018-05-18 DIAGNOSIS — M0579 Rheumatoid arthritis with rheumatoid factor of multiple sites without organ or systems involvement: Secondary | ICD-10-CM | POA: Diagnosis not present

## 2018-05-18 DIAGNOSIS — M150 Primary generalized (osteo)arthritis: Secondary | ICD-10-CM | POA: Diagnosis not present

## 2018-05-18 DIAGNOSIS — Z79899 Other long term (current) drug therapy: Secondary | ICD-10-CM | POA: Diagnosis not present

## 2018-05-18 DIAGNOSIS — M810 Age-related osteoporosis without current pathological fracture: Secondary | ICD-10-CM | POA: Diagnosis not present

## 2018-05-18 DIAGNOSIS — M2550 Pain in unspecified joint: Secondary | ICD-10-CM | POA: Diagnosis not present

## 2018-05-18 DIAGNOSIS — M25572 Pain in left ankle and joints of left foot: Secondary | ICD-10-CM | POA: Diagnosis not present

## 2018-05-18 MED FILL — IBANDRONATE SODIUM 150 MG T: 150 | 90 days supply | Qty: 3 | Fill #0

## 2018-06-09 DIAGNOSIS — L659 Nonscarring hair loss, unspecified: Secondary | ICD-10-CM | POA: Diagnosis not present

## 2018-06-15 MED FILL — OLUMIANT 2 MG TABS: 2 | 30 days supply | Qty: 30 | Fill #0

## 2018-06-20 ENCOUNTER — Encounter: Payer: Self-pay | Admitting: Pharmacist

## 2018-06-20 ENCOUNTER — Ambulatory Visit (INDEPENDENT_AMBULATORY_CARE_PROVIDER_SITE_OTHER): Payer: 59 | Admitting: Pharmacist

## 2018-06-20 ENCOUNTER — Other Ambulatory Visit: Payer: Self-pay

## 2018-06-20 DIAGNOSIS — Z79899 Other long term (current) drug therapy: Secondary | ICD-10-CM

## 2018-06-20 MED ORDER — OLUMIANT 2 MG PO TABS: 1.0000 | ORAL_TABLET | Freq: Every day | ORAL | 0 refills | Status: DC

## 2018-06-20 NOTE — Progress Notes (Signed)
   S: Patient presents today for review of their specialty medication.   Patient is currently taking Olumiant for rheumatoid arthritis. Patient is managed by [name] for this.   Dosing: 2 mg daily  Adherence: denies any missed doses  Efficacy: working well for her so far  Monitoring:  S/sx infection: denies S/sx malignancy: denies S/sx thrombosis: denies GI upset: denies CBC: regularly monitored by Dr. Trudie Reed' office, last one WNL per patient  Current adverse effects: denies   O:     Lab Results  Component Value Date   WBC 5.6 06/02/2016   HGB 12.9 06/02/2016   HCT 40.7 06/02/2016   MCV 102.5 (H) 06/02/2016   PLT 329 06/02/2016      Chemistry      Component Value Date/Time   NA 138 06/02/2016 1352   K 3.9 06/02/2016 1352   CL 105 06/02/2016 1352   CO2 24 06/02/2016 1352   BUN <5 (L) 06/02/2016 1352   CREATININE 0.42 (L) 06/02/2016 1352      Component Value Date/Time   CALCIUM 9.3 06/02/2016 1352   ALKPHOS 87 03/31/2016 0842   AST 16 03/31/2016 0842   ALT 10 (L) 03/31/2016 0842   BILITOT 0.2 (L) 03/31/2016 0842       A/P: 1. Medication review: patient currently on Olumiant and is tolerating it well with no adverse effects. Reviewed the medication with her, including the following: Olumiant is a Janus Associated Kinase Inhibitor used in the treatment of rheumatoid arthritis. The medication can be taken with or without food. Possible adverse effects include increased risk of infection, increased risk of malignancy, thrombosis, GI upset, and blood count abnormalities. No recommendations for any changes.  Christella Hartigan, PharmD, BCPS, BCACP, CPP Clinical Pharmacist Practitioner  [phone]

## 2018-06-30 DIAGNOSIS — E7800 Pure hypercholesterolemia, unspecified: Secondary | ICD-10-CM | POA: Diagnosis not present

## 2018-06-30 DIAGNOSIS — R399 Unspecified symptoms and signs involving the genitourinary system: Secondary | ICD-10-CM | POA: Diagnosis not present

## 2018-07-05 LAB — LIPID PANEL
Cholesterol: 232 — AB (ref 0–200)
HDL: 153 — AB (ref 35–70)
LDL Cholesterol: 132
LDl/HDL Ratio: 2.9
Triglycerides: 105 (ref 40–160)

## 2018-07-10 DIAGNOSIS — L650 Telogen effluvium: Secondary | ICD-10-CM | POA: Diagnosis not present

## 2018-07-17 MED FILL — predniSONE 5 MG TABS: 5 | 90 days supply | Qty: 90 | Fill #0

## 2018-07-18 MED FILL — OLUMIANT 2 MG TABS: 2 | 30 days supply | Qty: 30 | Fill #0

## 2018-07-27 DIAGNOSIS — M25512 Pain in left shoulder: Secondary | ICD-10-CM | POA: Diagnosis not present

## 2018-07-27 DIAGNOSIS — M0579 Rheumatoid arthritis with rheumatoid factor of multiple sites without organ or systems involvement: Secondary | ICD-10-CM | POA: Diagnosis not present

## 2018-07-27 DIAGNOSIS — M2550 Pain in unspecified joint: Secondary | ICD-10-CM | POA: Diagnosis not present

## 2018-07-27 DIAGNOSIS — M25572 Pain in left ankle and joints of left foot: Secondary | ICD-10-CM | POA: Diagnosis not present

## 2018-07-27 DIAGNOSIS — M810 Age-related osteoporosis without current pathological fracture: Secondary | ICD-10-CM | POA: Diagnosis not present

## 2018-07-27 DIAGNOSIS — Z79899 Other long term (current) drug therapy: Secondary | ICD-10-CM | POA: Diagnosis not present

## 2018-07-27 DIAGNOSIS — M150 Primary generalized (osteo)arthritis: Secondary | ICD-10-CM | POA: Diagnosis not present

## 2018-07-27 MED FILL — METHOTREXATE 25 MG/ML VIAL: 250 | 28 days supply | Qty: 10 | Fill #0

## 2018-07-27 MED FILL — LEUCOVORIN CALCIUM 5 MG TAB: 5 | 30 days supply | Qty: 30 | Fill #0

## 2018-07-27 MED FILL — traMADol HCL 50 MG TABS: 50 | 30 days supply | Qty: 60 | Fill #0

## 2018-08-05 ENCOUNTER — Telehealth: Payer: 59 | Admitting: Physician Assistant

## 2018-08-05 DIAGNOSIS — M069 Rheumatoid arthritis, unspecified: Secondary | ICD-10-CM | POA: Diagnosis not present

## 2018-08-05 DIAGNOSIS — B370 Candidal stomatitis: Secondary | ICD-10-CM | POA: Diagnosis not present

## 2018-08-05 DIAGNOSIS — T50905A Adverse effect of unspecified drugs, medicaments and biological substances, initial encounter: Secondary | ICD-10-CM | POA: Diagnosis not present

## 2018-08-05 DIAGNOSIS — L719 Rosacea, unspecified: Secondary | ICD-10-CM | POA: Diagnosis not present

## 2018-08-05 DIAGNOSIS — Z7189 Other specified counseling: Secondary | ICD-10-CM | POA: Diagnosis not present

## 2018-08-05 NOTE — Progress Notes (Signed)
I have spent 5 minutes in review of e-visit questionnaire, review and updating patient chart, medical decision making and response to patient.   Syerra Abdelrahman Cody Whitley Strycharz, PA-C    

## 2018-08-05 NOTE — Progress Notes (Signed)
E Visit for Rash  We are sorry that you are not feeling well. Here is how we plan to help! Symptoms are consistent with allergic reaction to medication. Stop the offending agent (Diflucan). Contact your PCP office to discuss ongoing treatment for the thrush (they should have provider on call this weekend) I recommend you take Benadryl 25 mg - 50 mg every 4 hours to control the symptoms but if they last over 24 hours it is best that you see an office based provider for follow up.    HOME CARE:   Take cool showers and avoid direct sunlight.  Apply cool compress or wet dressings.  Take a bath in an oatmeal bath.  Sprinkle content of one Aveeno packet under running faucet with comfortably warm water.  Bathe for 15-20 minutes, 1-2 times daily.  Pat dry with a towel. Do not rub the rash.  Use hydrocortisone cream.  Take an antihistamine like Benadryl for widespread rashes that itch.  The adult dose of Benadryl is 25-50 mg by mouth 4 times daily.  Caution:  This type of medication may cause sleepiness.  Do not drink alcohol, drive, or operate dangerous machinery while taking antihistamines.  Do not take these medications if you have prostate enlargement.  Read package instructions thoroughly on all medications that you take.  GET HELP RIGHT AWAY IF:   Symptoms don't go away after treatment.  Severe itching that persists.  If you rash spreads or swells.  If you rash begins to smell.  If it blisters and opens or develops a yellow-brown crust.  You develop a fever.  You have a sore throat.  You become short of breath.  MAKE SURE YOU:  Understand these instructions. Will watch your condition. Will get help right away if you are not doing well or get worse.  Thank you for choosing an e-visit. Your e-visit answers were reviewed by a board certified advanced clinical practitioner to complete your personal care plan. Depending upon the condition, your plan could have included both  over the counter or prescription medications. Please review your pharmacy choice. Be sure that the pharmacy you have chosen is open so that you can pick up your prescription now.  If there is a problem you may message your provider in [place] to have the prescription routed to another pharmacy. Your safety is important to [place]. If you have drug allergies check your prescription carefully.  For the next 24 hours, you can use MyChart to ask questions about today's visit, request a non-urgent call back, or ask for a work or school excuse from your e-visit provider. You will get an email in the next two days asking about your experience. I hope that your e-visit has been valuable and will speed your recovery.

## 2018-08-09 DIAGNOSIS — M064 Inflammatory polyarthropathy: Secondary | ICD-10-CM | POA: Diagnosis not present

## 2018-08-09 DIAGNOSIS — M25512 Pain in left shoulder: Secondary | ICD-10-CM | POA: Diagnosis not present

## 2018-08-11 DIAGNOSIS — Z471 Aftercare following joint replacement surgery: Secondary | ICD-10-CM | POA: Diagnosis not present

## 2018-08-11 DIAGNOSIS — M25551 Pain in right hip: Secondary | ICD-10-CM | POA: Diagnosis not present

## 2018-08-11 DIAGNOSIS — Z96642 Presence of left artificial hip joint: Secondary | ICD-10-CM | POA: Diagnosis not present

## 2018-08-11 DIAGNOSIS — Z96641 Presence of right artificial hip joint: Secondary | ICD-10-CM | POA: Diagnosis not present

## 2018-08-11 DIAGNOSIS — Z96643 Presence of artificial hip joint, bilateral: Secondary | ICD-10-CM | POA: Diagnosis not present

## 2018-08-21 MED FILL — LEUCOVORIN CALCIUM 5 MG TAB: 5 | 30 days supply | Qty: 30 | Fill #0

## 2018-08-21 MED FILL — OLUMIANT 2 MG TABS: 2 | 30 days supply | Qty: 30 | Fill #0

## 2018-08-21 MED FILL — METHOTREXATE 25 MG/ML VIAL: 50 | 30 days supply | Qty: 2 | Fill #0

## 2018-08-22 MED FILL — traMADol HCL 50 MG TABS: 50 | 30 days supply | Qty: 60 | Fill #0

## 2018-09-01 NOTE — Patient Instructions (Addendum)
DUE TO COVID-19 ONLY ONE VISITOR IS ALLOWED TO COME WITH YOU AND STAY IN THE WAITING ROOM ONLY DURING PRE OP AND PROCEDURE DAY OF SURGERY. THE 1 VISITOR MAY VISIT WITH YOU AFTER SURGERY IN YOUR PRIVATE ROOM DURING VISITING HOURS ONLY!  YOU NEED TO HAVE A COVID 19 TEST ON___8-31____ @__1020  am_, THIS TEST MUST BE DONE BEFORE SURGERY, COME  [place] , [id number].  ([place]) ONCE YOUR COVID TEST IS COMPLETED, PLEASE BEGIN THE QUARANTINE INSTRUCTIONS AS OUTLINED IN YOUR HANDOUT.                LASHONDRIA SILVERWOOD  09/01/2018   Your procedure is scheduled on: 09-07-2018   Report to [facility] Main  Entrance              Report to [place] at 5:30AM     Call this number if you have problems the morning of surgery [place], NO CHEWING GUM CANDY OR MINTS.               Do not eat food After Midnight. YOU MAY HAVE CLEAR LIQUIDS FROM MIDNIGHT UNTIL 4:30AM.             At 4:30AM Please finish the prescribed Pre-Surgery ENSURE drink. Nothing by mouth after you finish the ENSURE drink !   CLEAR LIQUID DIET   Foods Allowed                                                                     Foods Excluded  Coffee and tea, regular and decaf                             liquids that you cannot  Plain Jell-O any favor except red or purple                                           see through such as: Fruit ices (not with fruit pulp)                                     milk, soups, orange juice  Iced Popsicles                                    All solid food Carbonated beverages, regular and diet                                    Cranberry, grape and apple juices Sports drinks like Gatorade Lightly seasoned clear broth or consume(fat free) Sugar, honey syrup  Sample Menu Breakfast  Lunch                                     Supper Cranberry juice                     Beef broth                            Chicken broth Jell-O                                     Grape juice                           Apple juice Coffee or tea                        Jell-O                                      Popsicle                                                Coffee or tea                        Coffee or tea  _____________________________________________________________________     Take these medicines the morning of surgery with A SIP OF WATER: None                                You may not have any metal on your body including hair pins and              piercings  Do not wear jewelry, make-up, lotions, powders or perfumes, deodorant             Do not wear nail polish.  Do not shave  48 hours prior to surgery.               Do not bring valuables to the hospital. [place].  Contacts, dentures or bridgework may not be worn into surgery.  Leave suitcase in the car. After surgery it may be brought to your room.                  Please read over the following fact sheets you were given: _____________________________________________________________________             [facility] - Preparing for Surgery Before surgery, you can play an important role.  Because skin is not sterile, your skin needs to be as free of germs as possible.  You can reduce the number of germs on your skin by washing with CHG (chlorahexidine gluconate) soap before surgery.  CHG is an antiseptic cleaner which kills germs and bonds with the skin to continue killing germs even after washing. Please DO NOT use if you  have an allergy to CHG or antibacterial soaps.  If your skin becomes reddened/irritated stop using the CHG and inform your nurse when you arrive at Short Stay. Do not shave (including legs and underarms) for at least 48 hours prior to the first CHG shower.  You may shave your face/neck. Please follow these instructions  carefully:  1.  Shower with CHG Soap the night before surgery and the  morning of Surgery.  2.  If you choose to wash your hair, wash your hair first as usual with your  normal  shampoo.  3.  After you shampoo, rinse your hair and body thoroughly to remove the  shampoo.                           4.  Use CHG as you would any other liquid soap.  You can apply chg directly  to the skin and wash                       Gently with a scrungie or clean washcloth.  5.  Apply the CHG Soap to your body ONLY FROM THE NECK DOWN.   Do not use on face/ open                           Wound or open sores. Avoid contact with eyes, ears mouth and genitals (private parts).                       Wash face,  Genitals (private parts) with your normal soap.             6.  Wash thoroughly, paying special attention to the area where your surgery  will be performed.  7.  Thoroughly rinse your body with warm water from the neck down.  8.  DO NOT shower/wash with your normal soap after using and rinsing off  the CHG Soap.                9.  Pat yourself dry with a clean towel.            10.  Wear clean pajamas.            11.  Place clean sheets on your bed the night of your first shower and do not  sleep with pets. Day of Surgery : Do not apply any lotions/deodorants the morning of surgery.  Please wear clean clothes to the hospital/surgery center.  FAILURE TO FOLLOW THESE INSTRUCTIONS MAY RESULT IN THE CANCELLATION OF YOUR SURGERY PATIENT SIGNATURE_________________________________  NURSE SIGNATURE__________________________________  ________________________________________________________________________   Adam Phenix  An incentive spirometer is a tool that can help keep your lungs clear and active. This tool measures how well you are filling your lungs with each breath. Taking long deep breaths may help reverse or decrease the chance of developing breathing (pulmonary) problems (especially infection)  following:  A long period of time when you are unable to move or be active. BEFORE THE PROCEDURE   If the spirometer includes an indicator to show your best effort, your nurse or respiratory therapist will set it to a desired goal.  If possible, sit up straight or lean slightly forward. Try not to slouch.  Hold the incentive spirometer in an upright position. INSTRUCTIONS FOR USE  1. Sit on the edge of your bed if possible, or sit up  as far as you can in bed or on a chair. 2. Hold the incentive spirometer in an upright position. 3. Breathe out normally. 4. Place the mouthpiece in your mouth and seal your lips tightly around it. 5. Breathe in slowly and as deeply as possible, raising the piston or the ball toward the top of the column. 6. Hold your breath for 3-5 seconds or for as long as possible. Allow the piston or ball to fall to the bottom of the column. 7. Remove the mouthpiece from your mouth and breathe out normally. 8. Rest for a few seconds and repeat Steps 1 through 7 at least 10 times every 1-2 hours when you are awake. Take your time and take a few normal breaths between deep breaths. 9. The spirometer may include an indicator to show your best effort. Use the indicator as a goal to work toward during each repetition. 10. After each set of 10 deep breaths, practice coughing to be sure your lungs are clear. If you have an incision (the cut made at the time of surgery), support your incision when coughing by placing a pillow or rolled up towels firmly against it. Once you are able to get out of bed, walk around indoors and cough well. You may stop using the incentive spirometer when instructed by your caregiver.  RISKS AND COMPLICATIONS  Take your time so you do not get dizzy or light-headed.  If you are in pain, you may need to take or ask for pain medication before doing incentive spirometry. It is harder to take a deep breath if you are having pain. AFTER USE  Rest and  breathe slowly and easily.  It can be helpful to keep track of a log of your progress. Your caregiver can provide you with a simple table to help with this. If you are using the spirometer at home, follow these instructions: [place] IF:   You are having difficultly using the spirometer.  You have trouble using the spirometer as often as instructed.  Your pain medication is not giving enough relief while using the spirometer.  You develop fever of 100.5 F (38.1 C) or higher. SEEK IMMEDIATE MEDICAL CARE IF:   You cough up bloody sputum that had not been present before.  You develop fever of 102 F (38.9 C) or greater.  You develop worsening pain at or near the incision site. MAKE SURE YOU:   Understand these instructions.  Will watch your condition.  Will get help right away if you are not doing well or get worse. Document Released: 05/03/2006 Document Revised: 03/15/2011 Document Reviewed: 07/04/2006 [facility] Patient Information 2014 [place], [place].   ________________________________________________________________________

## 2018-09-04 ENCOUNTER — Encounter (HOSPITAL_COMMUNITY): Payer: Self-pay

## 2018-09-04 ENCOUNTER — Other Ambulatory Visit (HOSPITAL_COMMUNITY)
Admission: RE | Admit: 2018-09-04 | Discharge: 2018-09-04 | Disposition: A | Discharge status: Home / Self Care | Payer: 59 | Source: Ambulatory Visit | Attending: Orthopedic Surgery | Admitting: Orthopedic Surgery

## 2018-09-04 ENCOUNTER — Other Ambulatory Visit: Payer: Self-pay

## 2018-09-04 ENCOUNTER — Encounter (HOSPITAL_COMMUNITY)
Admission: RE | Admit: 2018-09-04 | Discharge: 2018-09-04 | Disposition: A | Discharge status: Home / Self Care | Payer: 59 | Source: Ambulatory Visit | Attending: Orthopedic Surgery | Admitting: Orthopedic Surgery

## 2018-09-04 DIAGNOSIS — Z79899 Other long term (current) drug therapy: Secondary | ICD-10-CM | POA: Diagnosis not present

## 2018-09-04 DIAGNOSIS — Z882 Allergy status to sulfonamides status: Secondary | ICD-10-CM | POA: Diagnosis not present

## 2018-09-04 DIAGNOSIS — M069 Rheumatoid arthritis, unspecified: Secondary | ICD-10-CM | POA: Diagnosis present

## 2018-09-04 DIAGNOSIS — Z87891 Personal history of nicotine dependence: Secondary | ICD-10-CM | POA: Diagnosis not present

## 2018-09-04 DIAGNOSIS — Z96643 Presence of artificial hip joint, bilateral: Secondary | ICD-10-CM | POA: Diagnosis not present

## 2018-09-04 DIAGNOSIS — Z7952 Long term (current) use of systemic steroids: Secondary | ICD-10-CM | POA: Diagnosis not present

## 2018-09-04 DIAGNOSIS — Z20828 Contact with and (suspected) exposure to other viral communicable diseases: Secondary | ICD-10-CM | POA: Insufficient documentation

## 2018-09-04 DIAGNOSIS — Z01812 Encounter for preprocedural laboratory examination: Secondary | ICD-10-CM | POA: Insufficient documentation

## 2018-09-04 DIAGNOSIS — Z7983 Long term (current) use of bisphosphonates: Secondary | ICD-10-CM | POA: Diagnosis not present

## 2018-09-04 DIAGNOSIS — Z79891 Long term (current) use of opiate analgesic: Secondary | ICD-10-CM | POA: Diagnosis not present

## 2018-09-04 DIAGNOSIS — Z96661 Presence of right artificial ankle joint: Secondary | ICD-10-CM | POA: Diagnosis not present

## 2018-09-04 DIAGNOSIS — Z981 Arthrodesis status: Secondary | ICD-10-CM | POA: Diagnosis not present

## 2018-09-04 DIAGNOSIS — Z888 Allergy status to other drugs, medicaments and biological substances status: Secondary | ICD-10-CM | POA: Diagnosis not present

## 2018-09-04 DIAGNOSIS — M12812 Other specific arthropathies, not elsewhere classified, left shoulder: Secondary | ICD-10-CM | POA: Insufficient documentation

## 2018-09-04 DIAGNOSIS — M08012 Unspecified juvenile rheumatoid arthritis, left shoulder: Secondary | ICD-10-CM | POA: Diagnosis not present

## 2018-09-04 LAB — BASIC METABOLIC PANEL
Anion gap: 8 (ref 5–15)
BUN: 8 mg/dL (ref 6–20)
CO2: 25 mmol/L (ref 22–32)
Calcium: 8.9 mg/dL (ref 8.9–10.3)
Chloride: 106 mmol/L (ref 98–111)
Creatinine, Ser: 0.56 mg/dL (ref 0.44–1.00)
GFR calc Af Amer: >60 mL/min (ref >60)
GFR calc non Af Amer: >60 mL/min (ref >60)
Glucose, Bld: 73 mg/dL (ref 70–99)
Potassium: 4 mmol/L (ref 3.5–5.1)
Sodium: 139 mmol/L (ref 135–145)

## 2018-09-04 LAB — CBC
HCT: 38.2 % (ref 36.0–46.0)
Hemoglobin: 12.3 g/dL (ref 12.0–15.0)
MCH: 34.4 pg — ABNORMAL HIGH (ref 26.0–34.0)
MCHC: 32.2 g/dL (ref 30.0–36.0)
MCV: 106.7 fL — ABNORMAL HIGH (ref 80.0–100.0)
Platelets: 413 10*3/uL — ABNORMAL HIGH (ref 150–400)
RBC: 3.58 MIL/uL — ABNORMAL LOW (ref 3.87–5.11)
RDW: 14.1 % (ref 11.5–15.5)
WBC: 9.2 10*3/uL (ref 4.0–10.5)
nRBC: 0 % (ref 0.0–0.2)

## 2018-09-04 LAB — SARS CORONAVIRUS 2 (TAT 6-24 HRS): SARS Coronavirus 2: NEGATIVE

## 2018-09-04 LAB — SURGICAL PCR SCREEN
MRSA, PCR: NEGATIVE
Staphylococcus aureus: NEGATIVE

## 2018-09-04 NOTE — Progress Notes (Signed)
PCP -  Dr. Donnie Coffin Cardiologist - N/A  Chest x-ray -N/A  EKG - N/A Stress Test - N/A ECHO - patient states had one in 2012 as PCP ? Heard a heart murmur on physical-was benign and never heard again from PCP  Cardiac Cath - N/A  Sleep Study - 2017- at [facility], study said patient snores, referred to dentist, Dr. Moshe Cipro and was made mouth piece  CPAP - N/A  Fasting Blood Sugar - N/A Checks Blood Sugar _____ times a day  Blood Thinner Instructions: Aspirin Instructions:N/A  Anesthesia review: Patient states stopped Thursday 08/31/2018 Olumiant, Leucovorin and Methotrexate per Leafy Kindle, PA at [place].  Patient denies shortness of breath, fever, cough and chest pain at PAT appointment   Patient verbalized understanding of instructions that were given to them at the PAT appointment. Patient was also instructed that they will need to review over the PAT instructions again at home before surgery.

## 2018-09-06 NOTE — Anesthesia Preprocedure Evaluation (Addendum)
Anesthesia Evaluation  Patient identified by MRN, date of birth, ID band Patient awake    Reviewed: Allergy & Precautions, NPO status , Patient's Chart, lab work & pertinent test results  History of Anesthesia Complications (+) DIFFICULT AIRWAY  Airway Mallampati: II  TM Distance: >3 FB Neck ROM: Full    Dental no notable dental hx. (+) Dental Advisory Given   Pulmonary neg pulmonary ROS, former smoker,    Pulmonary exam normal breath sounds clear to auscultation       Cardiovascular negative cardio ROS Normal cardiovascular exam Rhythm:Regular Rate:Normal     Neuro/Psych  Headaches, negative psych ROS   GI/Hepatic negative GI ROS, Neg liver ROS,   Endo/Other  negative endocrine ROS  Renal/GU negative Renal ROS  negative genitourinary   Musculoskeletal  (+) Arthritis ,   Abdominal   Peds  Hematology negative hematology ROS (+)   Anesthesia Other Findings   Reproductive/Obstetrics negative OB ROS                            Anesthesia Physical  Anesthesia Plan  ASA: II  Anesthesia Plan: General   Post-op Pain Management: GA combined w/ Regional for post-op pain   Induction: Intravenous  PONV Risk Score and Plan: 3 and Treatment may vary due to age or medical condition, Ondansetron and Dexamethasone  Airway Management Planned: Oral ETT  Additional Equipment: None  Intra-op Plan:   Post-operative Plan: Extubation in OR  Informed Consent: I have reviewed the patients History and Physical, chart, labs and discussed the procedure including the risks, benefits and alternatives for the proposed anesthesia with the patient or authorized representative who has indicated his/her understanding and acceptance.     Dental advisory given  Plan Discussed with: CRNA  Anesthesia Plan Comments:        Anesthesia Quick Evaluation

## 2018-09-07 ENCOUNTER — Ambulatory Visit (HOSPITAL_COMMUNITY): Payer: 59 | Admitting: Physician Assistant

## 2018-09-07 ENCOUNTER — Ambulatory Visit (HOSPITAL_COMMUNITY)
Admission: RE | Admit: 2018-09-07 | Discharge: 2018-09-08 | Disposition: A | Discharge status: Home / Self Care | Payer: 59 | Source: Other Acute Inpatient Hospital | Attending: Orthopedic Surgery | Admitting: Orthopedic Surgery

## 2018-09-07 ENCOUNTER — Ambulatory Visit (HOSPITAL_COMMUNITY): Payer: 59 | Admitting: Anesthesiology

## 2018-09-07 ENCOUNTER — Encounter (HOSPITAL_COMMUNITY)
Admission: RE | Disposition: A | Discharge status: Home / Self Care | Payer: Self-pay | Source: Other Acute Inpatient Hospital | Attending: Orthopedic Surgery

## 2018-09-07 ENCOUNTER — Other Ambulatory Visit: Payer: Self-pay

## 2018-09-07 ENCOUNTER — Encounter (HOSPITAL_COMMUNITY): Payer: Self-pay | Admitting: *Deleted

## 2018-09-07 DIAGNOSIS — Z20828 Contact with and (suspected) exposure to other viral communicable diseases: Secondary | ICD-10-CM | POA: Diagnosis not present

## 2018-09-07 DIAGNOSIS — M06812 Other specified rheumatoid arthritis, left shoulder: Secondary | ICD-10-CM | POA: Diagnosis not present

## 2018-09-07 DIAGNOSIS — Z79899 Other long term (current) drug therapy: Secondary | ICD-10-CM | POA: Insufficient documentation

## 2018-09-07 DIAGNOSIS — Z96661 Presence of right artificial ankle joint: Secondary | ICD-10-CM | POA: Insufficient documentation

## 2018-09-07 DIAGNOSIS — Z7952 Long term (current) use of systemic steroids: Secondary | ICD-10-CM | POA: Insufficient documentation

## 2018-09-07 DIAGNOSIS — Z981 Arthrodesis status: Secondary | ICD-10-CM | POA: Insufficient documentation

## 2018-09-07 DIAGNOSIS — Z87891 Personal history of nicotine dependence: Secondary | ICD-10-CM | POA: Insufficient documentation

## 2018-09-07 DIAGNOSIS — Z96643 Presence of artificial hip joint, bilateral: Secondary | ICD-10-CM | POA: Diagnosis not present

## 2018-09-07 DIAGNOSIS — M48061 Spinal stenosis, lumbar region without neurogenic claudication: Secondary | ICD-10-CM | POA: Diagnosis not present

## 2018-09-07 DIAGNOSIS — M19012 Primary osteoarthritis, left shoulder: Secondary | ICD-10-CM | POA: Diagnosis not present

## 2018-09-07 DIAGNOSIS — Z888 Allergy status to other drugs, medicaments and biological substances status: Secondary | ICD-10-CM | POA: Insufficient documentation

## 2018-09-07 DIAGNOSIS — Z96612 Presence of left artificial shoulder joint: Secondary | ICD-10-CM

## 2018-09-07 DIAGNOSIS — Z7983 Long term (current) use of bisphosphonates: Secondary | ICD-10-CM | POA: Insufficient documentation

## 2018-09-07 DIAGNOSIS — M08012 Unspecified juvenile rheumatoid arthritis, left shoulder: Secondary | ICD-10-CM | POA: Insufficient documentation

## 2018-09-07 DIAGNOSIS — Z882 Allergy status to sulfonamides status: Secondary | ICD-10-CM | POA: Insufficient documentation

## 2018-09-07 DIAGNOSIS — G8918 Other acute postprocedural pain: Secondary | ICD-10-CM | POA: Diagnosis not present

## 2018-09-07 DIAGNOSIS — Z79891 Long term (current) use of opiate analgesic: Secondary | ICD-10-CM | POA: Insufficient documentation

## 2018-09-07 DIAGNOSIS — R0683 Snoring: Secondary | ICD-10-CM | POA: Diagnosis not present

## 2018-09-07 HISTORY — PX: REVERSE SHOULDER ARTHROPLASTY: SHX5054

## 2018-09-07 LAB — PREGNANCY, URINE: Preg Test, Ur: NEGATIVE

## 2018-09-07 SURGERY — ARTHROPLASTY, SHOULDER, TOTAL, REVERSE
Anesthesia: General | Laterality: Left

## 2018-09-07 MED ORDER — MEPERIDINE HCL 50 MG/ML IJ SOLN: 6.2500 mg | INTRAMUSCULAR | Status: DC | PRN

## 2018-09-07 MED ORDER — KETOROLAC TROMETHAMINE 15 MG/ML IJ SOLN
INTRAMUSCULAR | Status: AC
  Filled 2018-09-07: qty 1

## 2018-09-07 MED ORDER — ROCURONIUM BROMIDE 100 MG/10ML IV SOLN
INTRAVENOUS | Status: DC | PRN
  Administered 2018-09-07: 10 mg via INTRAVENOUS
  Administered 2018-09-07: 50 mg via INTRAVENOUS
  Administered 2018-09-07: 10 mg via INTRAVENOUS

## 2018-09-07 MED ORDER — METOCLOPRAMIDE HCL 5 MG PO TABS: 5.0000 mg | ORAL_TABLET | Freq: Three times a day (TID) | ORAL | Status: DC | PRN

## 2018-09-07 MED ORDER — POLYETHYLENE GLYCOL 3350 17 G PO PACK: 17.0000 g | PACK | Freq: Every day | ORAL | Status: DC | PRN

## 2018-09-07 MED ORDER — SUGAMMADEX SODIUM 200 MG/2ML IV SOLN
INTRAVENOUS | Status: DC | PRN
  Administered 2018-09-07: 125 mg via INTRAVENOUS

## 2018-09-07 MED ORDER — PHENOL 1.4 % MT LIQD: 1.0000 | OROMUCOSAL | Status: DC | PRN

## 2018-09-07 MED ORDER — ONDANSETRON HCL 4 MG/2ML IJ SOLN
INTRAMUSCULAR | Status: AC
  Filled 2018-09-07: qty 2

## 2018-09-07 MED ORDER — LACTATED RINGERS IV SOLN
INTRAVENOUS | Status: DC
  Administered 2018-09-07: 06:00:00 via INTRAVENOUS

## 2018-09-07 MED ORDER — MIDAZOLAM HCL 5 MG/5ML IJ SOLN
INTRAMUSCULAR | Status: DC | PRN
  Administered 2018-09-07 (×2): 1 mg via INTRAVENOUS

## 2018-09-07 MED ORDER — TEMAZEPAM 15 MG PO CAPS: 15.0000 mg | ORAL_CAPSULE | Freq: Every evening | ORAL | Status: DC | PRN

## 2018-09-07 MED ORDER — DEXAMETHASONE SODIUM PHOSPHATE 10 MG/ML IJ SOLN
INTRAMUSCULAR | Status: DC | PRN
  Administered 2018-09-07: 10 mg via INTRAVENOUS

## 2018-09-07 MED ORDER — TRANEXAMIC ACID-NACL 1000-0.7 MG/100ML-% IV SOLN
1000.0000 mg | INTRAVENOUS | Status: AC
  Administered 2018-09-07: 1000 mg via INTRAVENOUS
  Filled 2018-09-07: qty 100

## 2018-09-07 MED ORDER — PROPOFOL 10 MG/ML IV BOLUS
INTRAVENOUS | Status: DC | PRN
  Administered 2018-09-07: 100 mg via INTRAVENOUS

## 2018-09-07 MED ORDER — MAGNESIUM CITRATE PO SOLN: 1.0000 | Freq: Once | ORAL | Status: DC | PRN

## 2018-09-07 MED ORDER — SUCCINYLCHOLINE CHLORIDE 200 MG/10ML IV SOSY
PREFILLED_SYRINGE | INTRAVENOUS | Status: AC
  Filled 2018-09-07: qty 10

## 2018-09-07 MED ORDER — LIDOCAINE HCL (CARDIAC) PF 100 MG/5ML IV SOSY
PREFILLED_SYRINGE | INTRAVENOUS | Status: DC | PRN
  Administered 2018-09-07: 60 mg via INTRAVENOUS

## 2018-09-07 MED ORDER — METHOCARBAMOL 500 MG IVPB - SIMPLE MED
500.0000 mg | Freq: Four times a day (QID) | INTRAVENOUS | Status: DC | PRN
  Filled 2018-09-07: qty 50

## 2018-09-07 MED ORDER — PANTOPRAZOLE SODIUM 40 MG PO TBEC: 40.0000 mg | DELAYED_RELEASE_TABLET | Freq: Every day | ORAL | Status: DC

## 2018-09-07 MED ORDER — KETOROLAC TROMETHAMINE 15 MG/ML IJ SOLN
15.0000 mg | Freq: Four times a day (QID) | INTRAMUSCULAR | Status: DC
  Administered 2018-09-07 – 2018-09-08 (×3): 15 mg via INTRAVENOUS
  Filled 2018-09-07 (×2): qty 1

## 2018-09-07 MED ORDER — LIDOCAINE 2% (20 MG/ML) 5 ML SYRINGE
INTRAMUSCULAR | Status: AC
  Filled 2018-09-07: qty 5

## 2018-09-07 MED ORDER — ONDANSETRON HCL 4 MG PO TABS: 4.0000 mg | ORAL_TABLET | Freq: Four times a day (QID) | ORAL | Status: DC | PRN

## 2018-09-07 MED ORDER — METOCLOPRAMIDE HCL 5 MG/ML IJ SOLN: 5.0000 mg | Freq: Three times a day (TID) | INTRAMUSCULAR | Status: DC | PRN

## 2018-09-07 MED ORDER — CEFAZOLIN SODIUM-DEXTROSE 2-4 GM/100ML-% IV SOLN
2.0000 g | INTRAVENOUS | Status: AC
  Administered 2018-09-07: 2 g via INTRAVENOUS
  Filled 2018-09-07: qty 100

## 2018-09-07 MED ORDER — TRAMADOL HCL 50 MG PO TABS
50.0000 mg | ORAL_TABLET | Freq: Four times a day (QID) | ORAL | Status: DC | PRN
  Filled 2018-09-07: qty 1

## 2018-09-07 MED ORDER — MIDAZOLAM HCL 2 MG/2ML IJ SOLN
INTRAMUSCULAR | Status: AC
  Filled 2018-09-07: qty 2

## 2018-09-07 MED ORDER — FENTANYL CITRATE (PF) 100 MCG/2ML IJ SOLN
INTRAMUSCULAR | Status: DC | PRN
  Administered 2018-09-07 (×2): 50 ug via INTRAVENOUS

## 2018-09-07 MED ORDER — OXYCODONE HCL 5 MG PO TABS: 10.0000 mg | ORAL_TABLET | ORAL | Status: DC | PRN

## 2018-09-07 MED ORDER — MENTHOL 3 MG MT LOZG: 1.0000 | LOZENGE | OROMUCOSAL | Status: DC | PRN

## 2018-09-07 MED ORDER — ONDANSETRON HCL 4 MG/2ML IJ SOLN: 4.0000 mg | Freq: Four times a day (QID) | INTRAMUSCULAR | Status: DC | PRN

## 2018-09-07 MED ORDER — BUPIVACAINE HCL (PF) 0.5 % IJ SOLN
INTRAMUSCULAR | Status: DC | PRN
  Administered 2018-09-07: 10 mL via PERINEURAL

## 2018-09-07 MED ORDER — HYDROMORPHONE HCL 1 MG/ML IJ SOLN: 0.2500 mg | INTRAMUSCULAR | Status: DC | PRN

## 2018-09-07 MED ORDER — KETOROLAC TROMETHAMINE 30 MG/ML IJ SOLN: 30.0000 mg | Freq: Once | INTRAMUSCULAR | Status: DC | PRN

## 2018-09-07 MED ORDER — PROMETHAZINE HCL 25 MG/ML IJ SOLN: 6.2500 mg | INTRAMUSCULAR | Status: DC | PRN

## 2018-09-07 MED ORDER — CHLORHEXIDINE GLUCONATE 4 % EX LIQD: 60.0000 mL | Freq: Once | CUTANEOUS | Status: DC

## 2018-09-07 MED ORDER — ONDANSETRON HCL 4 MG/2ML IJ SOLN
INTRAMUSCULAR | Status: DC | PRN
  Administered 2018-09-07: 4 mg via INTRAVENOUS

## 2018-09-07 MED ORDER — DEXAMETHASONE SODIUM PHOSPHATE 10 MG/ML IJ SOLN
INTRAMUSCULAR | Status: AC
  Filled 2018-09-07: qty 1

## 2018-09-07 MED ORDER — FENTANYL CITRATE (PF) 100 MCG/2ML IJ SOLN
INTRAMUSCULAR | Status: AC
  Filled 2018-09-07: qty 2

## 2018-09-07 MED ORDER — DOCUSATE SODIUM 100 MG PO CAPS
100.0000 mg | ORAL_CAPSULE | Freq: Two times a day (BID) | ORAL | Status: DC
  Administered 2018-09-07: 100 mg via ORAL
  Filled 2018-09-07: qty 1

## 2018-09-07 MED ORDER — SODIUM CHLORIDE 0.9 % IR SOLN
Status: DC | PRN
  Administered 2018-09-07: 1000 mL

## 2018-09-07 MED ORDER — BUPIVACAINE LIPOSOME 1.3 % IJ SUSP
INTRAMUSCULAR | Status: DC | PRN
  Administered 2018-09-07: 10 mL via PERINEURAL

## 2018-09-07 MED ORDER — ALUM & MAG HYDROXIDE-SIMETH 200-200-20 MG/5ML PO SUSP: 30.0000 mL | ORAL | Status: DC | PRN

## 2018-09-07 MED ORDER — ROCURONIUM BROMIDE 10 MG/ML (PF) SYRINGE
PREFILLED_SYRINGE | INTRAVENOUS | Status: AC
  Filled 2018-09-07: qty 10

## 2018-09-07 MED ORDER — LACTATED RINGERS IV SOLN
INTRAVENOUS | Status: DC
  Administered 2018-09-07: 13:00:00 via INTRAVENOUS

## 2018-09-07 MED ORDER — ACETAMINOPHEN 325 MG PO TABS: 325.0000 mg | ORAL_TABLET | Freq: Four times a day (QID) | ORAL | Status: DC | PRN

## 2018-09-07 MED ORDER — METHOCARBAMOL 500 MG PO TABS
500.0000 mg | ORAL_TABLET | Freq: Four times a day (QID) | ORAL | Status: DC | PRN
  Filled 2018-09-07: qty 1

## 2018-09-07 MED ORDER — PHENYLEPHRINE HCL (PRESSORS) 10 MG/ML IV SOLN
INTRAVENOUS | Status: AC
  Filled 2018-09-07: qty 1

## 2018-09-07 MED ORDER — HYDROMORPHONE HCL 1 MG/ML IJ SOLN: 0.5000 mg | INTRAMUSCULAR | Status: DC | PRN

## 2018-09-07 MED ORDER — OXYCODONE HCL 5 MG PO TABS
5.0000 mg | ORAL_TABLET | ORAL | Status: DC | PRN
  Administered 2018-09-07: 5 mg via ORAL
  Filled 2018-09-07: qty 1

## 2018-09-07 MED ORDER — PROPOFOL 10 MG/ML IV BOLUS
INTRAVENOUS | Status: AC
  Filled 2018-09-07: qty 20

## 2018-09-07 MED ORDER — PREDNISONE 5 MG PO TABS
5.0000 mg | ORAL_TABLET | Freq: Every evening | ORAL | Status: DC
  Administered 2018-09-07: 5 mg via ORAL
  Filled 2018-09-07: qty 1

## 2018-09-07 MED ORDER — DIPHENHYDRAMINE HCL 12.5 MG/5ML PO ELIX: 12.5000 mg | ORAL_SOLUTION | ORAL | Status: DC | PRN

## 2018-09-07 MED ORDER — BISACODYL 5 MG PO TBEC: 5.0000 mg | DELAYED_RELEASE_TABLET | Freq: Every day | ORAL | Status: DC | PRN

## 2018-09-07 SURGICAL SUPPLY — 63 items
BAG ZIPLOCK 12X15 (MISCELLANEOUS) ×2 IMPLANT
BEADED CABLE AND SLEEVE SET ×2 IMPLANT
BLADE SAW SGTL 83.5X18.5 (BLADE) ×2 IMPLANT
COOLER ICEMAN CLASSIC (MISCELLANEOUS) ×2 IMPLANT
COVER BACK TABLE 60X90IN (DRAPES) ×2 IMPLANT
COVER SURGICAL LIGHT HANDLE (MISCELLANEOUS) ×2 IMPLANT
COVER WAND RF STERILE (DRAPES) IMPLANT
CUP SUT UNIV REVERS 36 NEUTRAL (Cup) ×2 IMPLANT
DERMABOND ADVANCED (GAUZE/BANDAGES/DRESSINGS) ×1
DERMABOND ADVANCED .7 DNX12 (GAUZE/BANDAGES/DRESSINGS) ×1 IMPLANT
DRAPE INCISE IOBAN 66X45 STRL (DRAPES) IMPLANT
DRAPE ORTHO SPLIT 77X108 STRL (DRAPES) ×2
DRAPE SHEET LG 3/4 BI-LAMINATE (DRAPES) ×2 IMPLANT
DRAPE SURG 17X11 SM STRL (DRAPES) ×2 IMPLANT
DRAPE SURG ORHT 6 SPLT 77X108 (DRAPES) ×2 IMPLANT
DRAPE U-SHAPE 47X51 STRL (DRAPES) ×2 IMPLANT
DRSG AQUACEL AG ADV 3.5X 6 (GAUZE/BANDAGES/DRESSINGS) ×2 IMPLANT
DRSG AQUACEL AG ADV 3.5X10 (GAUZE/BANDAGES/DRESSINGS) ×2 IMPLANT
DURAPREP 26ML APPLICATOR (WOUND CARE) ×2 IMPLANT
ELECT BLADE TIP CTD 4 INCH (ELECTRODE) ×2 IMPLANT
ELECT REM PT RETURN 15FT ADLT (MISCELLANEOUS) ×2 IMPLANT
FACESHIELD WRAPAROUND (MASK) ×8 IMPLANT
GLENOID UNI REV MOD 24 +2 LAT (Joint) ×2 IMPLANT
GLENOSPHERE 36 +4 LAT/24 (Joint) ×2 IMPLANT
GLOVE BIO SURGEON STRL SZ7.5 (GLOVE) ×2 IMPLANT
GLOVE BIO SURGEON STRL SZ8 (GLOVE) ×2 IMPLANT
GLOVE SS BIOGEL STRL SZ 7 (GLOVE) ×1 IMPLANT
GLOVE SS BIOGEL STRL SZ 7.5 (GLOVE) ×1 IMPLANT
GLOVE SUPERSENSE BIOGEL SZ 7 (GLOVE) ×1
GLOVE SUPERSENSE BIOGEL SZ 7.5 (GLOVE) ×1
GOWN STRL REUS W/TWL LRG LVL3 (GOWN DISPOSABLE) ×4 IMPLANT
INSERT HUMERAL 36 +6 (Shoulder) ×2 IMPLANT
KIT BASIN OR (CUSTOM PROCEDURE TRAY) ×2 IMPLANT
KIT TURNOVER KIT A (KITS) IMPLANT
MANIFOLD NEPTUNE II (INSTRUMENTS) ×2 IMPLANT
NEEDLE TAPERED W/ NITINOL LOOP (MISCELLANEOUS) ×2 IMPLANT
NS IRRIG 1000ML POUR BTL (IV SOLUTION) ×2 IMPLANT
PACK SHOULDER (CUSTOM PROCEDURE TRAY) ×2 IMPLANT
PAD ARMBOARD 7.5X6 YLW CONV (MISCELLANEOUS) ×2 IMPLANT
PAD COLD SHLDR WRAP-ON (PAD) ×2 IMPLANT
PIN SET MODULAR GLENOID SYSTEM (PIN) ×2 IMPLANT
RESTRAINT HEAD UNIVERSAL NS (MISCELLANEOUS) ×2 IMPLANT
SCREW CENTRAL MODULAR 25 (Screw) ×2 IMPLANT
SCREW PERI LOCK 5.5X24 (Screw) ×2 IMPLANT
SCREW PERIPHERAL 5.5X20 LOCK (Screw) ×6 IMPLANT
SET DALL MILES CABLE SL 1.6 (MISCELLANEOUS) ×2 IMPLANT
SLING ARM FOAM STRAP LRG (SOFTGOODS) IMPLANT
SLING ARM FOAM STRAP MED (SOFTGOODS) ×2 IMPLANT
SPONGE LAP 18X18 RF (DISPOSABLE) IMPLANT
STEM HUMERAL MOD SZ 5 135 DEG (Stem) ×2 IMPLANT
SUCTION FRAZIER HANDLE 12FR (TUBING) ×1
SUCTION TUBE FRAZIER 12FR DISP (TUBING) ×1 IMPLANT
SUT FIBERWIRE #2 38 T-5 BLUE (SUTURE)
SUT MNCRL AB 3-0 PS2 18 (SUTURE) ×2 IMPLANT
SUT MON AB 2-0 CT1 36 (SUTURE) ×2 IMPLANT
SUT VIC AB 1 CT1 36 (SUTURE) ×2 IMPLANT
SUTURE FIBERWR #2 38 T-5 BLUE (SUTURE) IMPLANT
SUTURE TAPE 1.3 40 TPR END (SUTURE) ×2 IMPLANT
SUTURETAPE 1.3 40 TPR END (SUTURE) ×4
TOWEL OR 17X26 10 PK STRL BLUE (TOWEL DISPOSABLE) ×2 IMPLANT
TOWEL OR NON WOVEN STRL DISP B (DISPOSABLE) ×2 IMPLANT
WATER STERILE IRR 1000ML POUR (IV SOLUTION) ×4 IMPLANT
YANKAUER SUCT BULB TIP 10FT TU (MISCELLANEOUS) ×2 IMPLANT

## 2018-09-07 NOTE — Anesthesia Procedure Notes (Signed)
Procedure Name: Intubation Date/Time: 09/07/2018 7:41 AM Performed by: Glory Buff, CRNA Pre-anesthesia Checklist: Patient identified, Emergency Drugs available, Suction available and Patient being monitored Patient Re-evaluated:Patient Re-evaluated prior to induction Oxygen Delivery Method: Circle system utilized Preoxygenation: Pre-oxygenation with 100% oxygen Induction Type: IV induction Ventilation: Mask ventilation without difficulty Laryngoscope Size: Glidescope and 3 Grade View: Grade I Tube type: Oral Tube size: 7.0 mm Number of attempts: 1 Airway Equipment and Method: Stylet and Video-laryngoscopy Placement Confirmation: ETT inserted through vocal cords under direct vision,  positive ETCO2 and breath sounds checked- equal and bilateral Secured at: 20 cm Tube secured with: Tape Dental Injury: Teeth and Oropharynx as per pre-operative assessment  Difficulty Due To: Difficulty was anticipated, Difficult Airway- due to reduced neck mobility, Difficult Airway- due to limited oral opening and Difficult Airway- due to large tongue Future Recommendations: Recommend- induction with short-acting agent, and alternative techniques readily available

## 2018-09-07 NOTE — Discharge Instructions (Signed)
° °Kevin M. Supple, M.D., F.A.A.O.S. °Orthopaedic Surgery °Specializing in Arthroscopic and Reconstructive °Surgery of the Shoulder °[phone] °[address] - [place], [place] [address] - Fax [phone] ° ° °POST-OP TOTAL SHOULDER REPLACEMENT INSTRUCTIONS ° °1. Call the office at [phone] to schedule your first post-op appointment 10-14 days from the date of your surgery. ° °2. The bandage over your incision is waterproof. You may begin showering with this dressing on. You may leave this dressing on until first follow up appointment within 2 weeks. We prefer you leave this dressing in place until follow up however after 5-7 days if you are having itching or skin irritation and would like to remove it you may do so. Go slow and tug at the borders gently to break the bond the dressing has with the skin. At this point if there is no drainage it is okay to go without a bandage or you may cover it with a light guaze and tape. You can also expect significant bruising around your shoulder that will drift down your arm and into your chest wall. This is very normal and should resolve over several days. ° ° 3. Wear your sling/immobilizer at all times except to perform the exercises below or to occasionally let your arm dangle by your side to stretch your elbow. You also need to sleep in your sling immobilizer until instructed otherwise. It is ok to remove your sling if you are sitting in a controlled environment and allow your arm to rest in a position of comfort by your side or on your lap with pillows to give your neck and skin a break from the sling. You may remove it to allow arm to dangle by side to shower. If you are up walking around and when you go to sleep at night you need to wear it. ° °4. Range of motion to your elbow, wrist, and hand are encouraged 3-5 times daily. Exercise to your hand and fingers helps to reduce swelling you may experience. ° °5. Utilize ice to the shoulder 3-5 times  minimum a day and additionally if you are experiencing pain. ° °6. Prescriptions for a pain medication and a muscle relaxant are provided for you. It is recommended that if you are experiencing pain that you pain medication alone is not controlling, add the muscle relaxant along with the pain medication which can give additional pain relief. The first 1-2 days is generally the most severe of your pain and then should gradually decrease. As your pain lessens it is recommended that you decrease your use of the pain medications to an "as needed basis'" only and to always comply with the recommended dosages of the pain medications. ° °7. Pain medications can produce constipation along with their use. If you experience this, the use of an over the counter stool softener or laxative daily is recommended.  ° °8. For additional questions or concerns, please do not hesitate to call the office. If after hours there is an answering service to forward your concerns to the physician on call. ° °9.Pain control following an exparel block ° °To help control your post-operative pain you received a nerve block  performed with Exparel which is a long acting anesthetic (numbing agent) which can provide pain relief and sensations of numbness (and relief of pain) in the operative shoulder and arm for up to 3 days. Sometimes it provides mixed relief, meaning you may still have numbness in certain areas of the arm but can still   be able to move  parts of that arm, hand, and fingers. We recommend that your prescribed pain medications  be used as needed. We do not feel it is necessary to "pre medicate" and "stay ahead" of pain.  Taking narcotic pain medications when you are not having any pain can lead to unnecessary and potentially dangerous side effects.   ° °10. Use the ice machine as much as possible in the first 5-7 days from surgery, then you can wean its use to as needed. The ice typically needs to be replaced every 6 hours, instead of  ice you can actually freeze water bottles to put in the cooler and then fill water around them to avoid having to purchase ice. You can have spare water bottles freezing to allow you to rotate them once they have melted. Try to have a thin shirt or light cloth or towel under the ice wrap to protect your skin. ° °POST-OP EXERCISES ° °Pendulum Exercises ° °Perform pendulum exercises while standing and bending at the waist. Support your uninvolved arm on a table or chair and allow your operated arm to hang freely. Make sure to do these exercises passively - not using you shoulder muscles. ° °Repeat 20 times. Do 3 sessions per day. ° ° ° ° °

## 2018-09-07 NOTE — Op Note (Signed)
09/07/2018  9:29 AM  PATIENT:   Anne Garrett  43 y.o. female  PRE-OPERATIVE DIAGNOSIS:  left shoulder rheumatoid arthropathy  POST-OPERATIVE DIAGNOSIS: Same  PROCEDURE: Left shoulder reverse arthroplasty utilizing a press-fit size 5.5 Arthrex stem with a +6 polyethylene insert, 36/+4 glenosphere on a small/+2 baseplate  SURGEON:  Joon Pohle, Metta Clines M.D.  ASSISTANTS: Jenetta Loges, PA-C  ANESTHESIA:   General endotracheal and interscalene block with Exparel  EBL: 150 cc  SPECIMEN: None  Drains: None   PATIENT DISPOSITION:  PACU - hemodynamically stable.    PLAN OF CARE: Admit for overnight observation  Brief history:  Anne Garrett is a 43 year old female with known rheumatoid arthritis and end stage left shoulder rheumatoid arthropathy with profoundly restricted mobility and pain with associated functional limitations.  She is brought to the operating this time for planned left shoulder reverse arthroplasty  Preoperatively I counseled Anne Garrett regarding treatment options as well as the potential risks versus benefits thereof.  Possible surgical complications were reviewed including bleeding, infection, neurovascular injury, persistent pain, loss of motion, failure the implant, anesthetic complication, and possible need for additional surgery.  She understands, and accepts, and agrees with our planned procedure.  Procedure in detail:  After undergoing routine preop evaluation patient received prophylactic antibiotics and interscalene block with Exparel was established in the holding area by the anesthesia department.  Placed supine on the operating table and underwent the smooth induction of a general endotracheal anesthesia.  Patient was placed into the beachchair position and appropriately padded and protected.  The left shoulder girdle region was sterilely prepped and draped in standard fashion.  Timeout was called.  Of note the patient had profoundly restricted mobility with  absolutely no glenohumeral motion noted on examination under anesthesia.  A deltopectoral approach was made through an 8 cm anterior shoulder incision.  The dissection was carried deeply and the deltopectoral interval was then developed from proximal to distal with the vein taken laterally.  The upper centimeter and a half of the pectoralis major was tenotomized to enhance exposure conjoined tendon was mobilized and retracted medially and the long head bicep tendon was tenodesed at the level of the pectoralis major tendon and the proximal segment was unroofed and excised.  At this point additional dissection was extremely challenging due to the ankylosed status of the joint.  The subscapularis was divided from its lesser tuberosity insertion and capsular attachments were then divided from the anterior and infra margins of the humeral head and the superior aspect of the rotator cuff was then divided and with this we were able to begin some mobilization of the shoulder and ultimately were able to dislocate the humeral head from the glenoid.  Once the head was then delivered through the wound we made our humeral head resection at approximate 20 degrees retroversion using oscillating saw after measuring off the extra medullary guide.  At this point we then gained exposure of the glenoid using appropriate circumferential retractors and performed a circumferential labral resection gaining complete visualization of the entire margin of the glenoid.  A guidepin was then directed into the central aspect of the glenoid with a slight 10 degree inferior tilt and the glenoid was then prepared using our central and then peripheral reamer.  The central drill hole was then drilled and tapped and our glenoid implant was then assembled with a 25 mm lag screw on the glenoid was then inserted with excellent purchase and fixation.  The peripheral locking screws were all then  placed obtaining excellent purchase and fixation.  The 36/+4  glenosphere was then impacted onto the base plate and the central locking screw was then inserted with good fixation and stability.  At this point we then returned our attention to the proximal humerus where the canal was prepared and reaming up to size 6 and the canal was quite small.  We broached to size 5.5 and we did notice a potential medial calcar disruption and so prophylactically placed a Dall-Miles cable in a cerclage fashion around the proximal humeral metaphysis to prevent propagation of a fracture line distally.  We then completed preparation the canal broaching and then reaming proximally with a neutral reamer.  A trial reduction was then performed which showed good soft tissue balance.  Our final implant was then assembled the size 5.5 with a neutral metaphysis.  This was then impacted achieving excellent stability.  Trial reductions were then completed and this time the +6 polyethylene insert gave [place] the best soft tissue balance.  Trial was removed the final +6 poly-was impacted final reduction was then performed after the joint was copes irrigated.  At completion shoulder showed approximate 45 degrees of external rotation with excellent stability.  There was no residual subscapularis to repair.  Final hemostasis was obtained.  We did note that the cephalic vein was severely attenuated and ultimately required ligation.  The deltopectoral interval was then repaired with a series of figure-of-eight number Vicryl sutures.  2-0 Vicryl used for the subcu layer and intracuticular 3 Monocryl the skin followed by Dermabond and Aquasol dressing left arm was placed into a sling and the patient was awakened, extubated, taken recovery room in stable addition.  Jenetta Loges, PA-C was used as an Environmental consultant throughout this case essential for help with positioning of the patient, positioning extremity, tissue manipulation, implantation of the prosthesis, wound closure, and intraoperative decision making.  Metta Clines  Taygen Newsome MD   Contact # [phone]

## 2018-09-07 NOTE — Anesthesia Procedure Notes (Signed)
Anesthesia Regional Block: Interscalene brachial plexus block   Pre-Anesthetic Checklist: ,, timeout performed, Correct Patient, Correct Site, Correct Laterality, Correct Procedure, Correct Position, site marked, Risks and benefits discussed,  Surgical consent,  Pre-op evaluation,  At surgeon's request and post-op pain management  Laterality: Left  Prep: chloraprep       Needles:  Injection technique: Single-shot  Needle Type: Stimiplex          Additional Needles:   Procedures:,,,, ultrasound used (permanent image in chart),,,,  Narrative:  Start time: 09/07/2018 7:15 AM End time: 09/07/2018 7:20 AM Injection made incrementally with aspirations every 5 mL.  Performed by: Personally  Anesthesiologist: Nolon Nations, MD  Additional Notes: Patient tolerated the procedure well without complications

## 2018-09-07 NOTE — Anesthesia Postprocedure Evaluation (Signed)
Anesthesia Post Note  Patient: Anne Garrett  Procedure(s) Performed: REVERSE SHOULDER ARTHROPLASTY (Left )     Patient location during evaluation: PACU Anesthesia Type: General Level of consciousness: sedated and patient cooperative Pain management: pain level controlled Vital Signs Assessment: post-procedure vital signs reviewed and stable Respiratory status: spontaneous breathing Cardiovascular status: stable Anesthetic complications: no    Last Vitals:  Vitals:   09/07/18 1318 09/07/18 1437  BP: 132/82 124/86  Pulse: 87 84  Resp: 16 16  Temp: 37.1 C 37.4 C  SpO2: 98% 98%    Last Pain:  Vitals:   09/07/18 1318  TempSrc: Oral  PainSc: 0-No pain                 Nolon Nations

## 2018-09-07 NOTE — H&P (Signed)
Anne Garrett    Chief Complaint: left shoulder rheumatoid arthropathy HPI: The patient is a 43 y.o. female with severe left shoulder arthrosis with significant joint deformity and associated rotator cuff dysfunction  Past Medical History:  Diagnosis Date  . Adrenal insufficiency ([place])    secondary to Prednisone  . Eczema    coner oif eye lid  . Headache    Migraine  . Heart murmur 2012   -? heard at PCP visit 10-15 years ago-had ECHO -benign  . Intervertebral disk disease   . Osteoporosis   . Rheumatoid arthritis ([place])    Juvenile onset    Past Surgical History:  Procedure Laterality Date  . ANKLE ARTHROPLASTY Right   . ANTERIOR CERVICAL DECOMP/DISCECTOMY FUSION N/A 06/19/2015   Procedure: C3-4 C4-5 Anterior cervical decompression/diskectomy/fusion;  Surgeon: Anne Gamma, MD;  Location: [facility];  Service: Neurosurgery;  Laterality: N/A;  C3-4 C4-5 Anterior cervical decompression/diskectomy/fusion  . APPENDECTOMY    . CARPAL TUNNEL RELEASE Right   . CESAREAN SECTION    . CHOLECYSTECTOMY    . COLONOSCOPY    . ESSURE TUBAL LIGATION     Essure Implant-Permanent birth control  . excision to palm Left    excision of mass to right plam  . HARDWARE REMOVAL Right 02/02/2017   Procedure: RIGHT WRIST DEEP IMPLANT REMOVAL, RIGHT WRIST AND HAND EXTENSOR TENDON RECONSTRUCTION, TENDON TRANSFER AND RECONSTRUCTION AS INDICATED;  Surgeon: Anne Planas, MD;  Location: [facility];  Service: Orthopedics;  Laterality: Right;  . JOINT REPLACEMENT Bilateral    bilat hip replacements  . ORIF FEMUR FRACTURE Right   . OTHER SURGICAL HISTORY  06/04/2016   Revision of PLIF  . plif l5-s1 arthrodeisis    2018  . POSTERIOR CERVICAL FUSION/FORAMINOTOMY N/A 07/12/2015   Procedure: C2 to C5 Cervical laminectomy, C2 to C5 posterior cervical arthrodesis with instrumentation and bone graft;  Surgeon: Anne Gamma, MD;  Location: [facility];  Service: Neurosurgery;  Laterality: N/A;  C2 to C5 Cervical  laminectomy, C2  to C5 posterior cervical arthrodesis with instrumentation and bone graft  . REPAIR EXTENSOR TENDON Right 02/02/2017   Procedure: REPAIR EXTENSOR TENDON;  Surgeon: Anne Planas, MD;  Location: [facility];  Service: Orthopedics;  Laterality: Right;  . right foot surgery Right    right jfoot arthrodesis and right ankle arthroplasty  . WRIST SURGERY Bilateral    arthrodesis    Family History  Problem Relation Age of Onset  . Breast cancer Mother 22  . Nephritis Father   . Epilepsy Father   . Breast cancer Maternal Aunt        2 mat aunts   . Multiple sclerosis Maternal Uncle   . Psoriasis Paternal Aunt     Social History:  reports that she quit smoking about 3 years ago. Her smoking use included cigarettes. She has a 5.00 pack-year smoking history. She has never used smokeless tobacco. She reports current alcohol use of about 2.0 standard drinks of alcohol per week. She reports that she does not use drugs.   Medications Prior to Admission  Medication Sig Dispense Refill  . Baricitinib (OLUMIANT) 2 MG TABS Take 1 tablet by mouth daily. (Patient taking differently: Take 2 mg by mouth daily. ) 30 tablet 0  . Butalbital-APAP-Caffeine 50-325-40 MG capsule Take 1 capsule by mouth daily as needed for migraine.    . Calcium Carb-Cholecalciferol (CALCIUM+D3 PO) Take 1 tablet by mouth daily.    . Cholecalciferol (VITAMIN D3) 50 MCG (2000  UT) TABS Take 2,000 Units by mouth daily.    Marland Kitchen ibandronate (BONIVA) 150 MG tablet Take 150 mg by mouth every 30 (thirty) days. Take in the morning with a full glass of water, on an empty stomach, and do not take anything else by mouth or lie down for the next 30 min.    Marland Kitchen ibuprofen (ADVIL,MOTRIN) 200 MG tablet Take 600-800 mg by mouth 2 (two) times daily as needed for headache, mild pain or moderate pain.     Marland Kitchen leucovorin (WELLCOVORIN) 5 MG tablet Take 5 mg by mouth daily.    . methotrexate 250 MG/10ML injection Inject 10 mg into the skin every  Saturday.    . Multiple Vitamin (MULTIVITAMIN WITH MINERALS) TABS tablet Take 1 tablet by mouth daily.    . predniSONE (DELTASONE) 5 MG tablet Take 5 mg by mouth every evening.     . traMADol (ULTRAM) 50 MG tablet Take 50 mg by mouth 2 (two) times daily.        Physical Exam: Left shoulder demonstrates profoundly restricted and painful motion with global weakness.  Examination otherwise as noted at recent office visits.  Plain radiographs confirm severe joint space deformity with glenoid erosion and complete obliteration of the joint space.  Vitals  Temp:  [98.7 F (37.1 C)] 98.7 F (37.1 C) (09/03 0552) Pulse Rate:  [101] 101 (09/03 0552) Resp:  [18] 18 (09/03 0552) BP: (124)/(90) 124/90 (09/03 0552) SpO2:  [98 %] 98 % (09/03 0552) Weight:  [57.2 kg] 57.2 kg (09/03 0542)  Assessment/Plan  Impression: left shoulder rheumatoid arthropathy  Plan of Action: Procedure(s): REVERSE SHOULDER ARTHROPLASTY  Anne Garrett 09/07/2018, 6:01 AM Contact # [phone]

## 2018-09-07 NOTE — Transfer of Care (Signed)
Immediate Anesthesia Transfer of Care Note  Patient: Anne Garrett  Procedure(s) Performed: REVERSE SHOULDER ARTHROPLASTY (Left )  Patient Location: PACU  Anesthesia Type:General  Level of Consciousness: drowsy, patient cooperative and responds to stimulation  Airway & Oxygen Therapy: Patient Spontanous Breathing and Patient connected to face mask oxygen  Post-op Assessment: Report given to RN and Post -op Vital signs reviewed and stable  Post vital signs: Reviewed and stable  Last Vitals:  Vitals Value Taken Time  BP 128/87 09/07/18 0942  Temp    Pulse 83 09/07/18 0943  Resp 14 09/07/18 0943  SpO2 100 % 09/07/18 0943  Vitals shown include unvalidated device data.  Last Pain:  Vitals:   09/07/18 0552  TempSrc: Oral      Patients Stated Pain Goal: 2 ([id number])  Complications: No apparent anesthesia complications

## 2018-09-08 DIAGNOSIS — Z96661 Presence of right artificial ankle joint: Secondary | ICD-10-CM | POA: Diagnosis not present

## 2018-09-08 DIAGNOSIS — Z7983 Long term (current) use of bisphosphonates: Secondary | ICD-10-CM | POA: Diagnosis not present

## 2018-09-08 DIAGNOSIS — Z981 Arthrodesis status: Secondary | ICD-10-CM | POA: Diagnosis not present

## 2018-09-08 DIAGNOSIS — Z79899 Other long term (current) drug therapy: Secondary | ICD-10-CM | POA: Diagnosis not present

## 2018-09-08 DIAGNOSIS — Z20828 Contact with and (suspected) exposure to other viral communicable diseases: Secondary | ICD-10-CM | POA: Diagnosis not present

## 2018-09-08 DIAGNOSIS — Z96643 Presence of artificial hip joint, bilateral: Secondary | ICD-10-CM | POA: Diagnosis not present

## 2018-09-08 DIAGNOSIS — Z7952 Long term (current) use of systemic steroids: Secondary | ICD-10-CM | POA: Diagnosis not present

## 2018-09-08 DIAGNOSIS — M08012 Unspecified juvenile rheumatoid arthritis, left shoulder: Secondary | ICD-10-CM | POA: Diagnosis not present

## 2018-09-08 DIAGNOSIS — Z87891 Personal history of nicotine dependence: Secondary | ICD-10-CM | POA: Diagnosis not present

## 2018-09-08 MED ORDER — CYCLOBENZAPRINE HCL 10 MG PO TABS: 10.0000 mg | ORAL_TABLET | Freq: Three times a day (TID) | ORAL | 1 refills | Status: DC | PRN

## 2018-09-08 MED ORDER — OXYCODONE-ACETAMINOPHEN 5-325 MG PO TABS: 1.0000 | ORAL_TABLET | ORAL | 0 refills | Status: DC | PRN

## 2018-09-08 MED ORDER — ONDANSETRON HCL 4 MG PO TABS: 4.0000 mg | ORAL_TABLET | Freq: Three times a day (TID) | ORAL | 0 refills | Status: DC | PRN

## 2018-09-08 MED FILL — ONDANSETRON HCL 4 MG TABLET: 4 | 3 days supply | Qty: 10 | Fill #0

## 2018-09-08 MED FILL — OXYCODONE-ACETAMINOPHEN 5-3: 5-325 | 5 days supply | Qty: 30 | Fill #0

## 2018-09-08 MED FILL — CYCLOBENZAPRINE 10 MG TAB: 10 | 10 days supply | Qty: 30 | Fill #0

## 2018-09-08 NOTE — Discharge Summary (Signed)
PATIENT ID:      Anne Garrett  MRN:     [id number] DOB/AGE:    [birth year] / 43 y.o.     DISCHARGE SUMMARY  ADMISSION DATE:    09/07/2018 DISCHARGE DATE:    ADMISSION DIAGNOSIS: left shoulder rheumatoid arthropathy Past Medical History:  Diagnosis Date  . Adrenal insufficiency ([place])    secondary to Prednisone  . Eczema    coner oif eye lid  . Headache    Migraine  . Heart murmur 2012   -? heard at PCP visit 10-15 years ago-had ECHO -benign  . Intervertebral disk disease   . Osteoporosis   . Rheumatoid arthritis ([place])    Juvenile onset    DISCHARGE DIAGNOSIS:   Active Problems:   S/P reverse total shoulder arthroplasty, left   PROCEDURE: Procedure(s): REVERSE SHOULDER ARTHROPLASTY on 09/07/2018  CONSULTS:    HISTORY:  See H&P in chart.  HOSPITAL COURSE:  Anne Garrett is a 43 y.o. admitted on 09/07/2018 with a diagnosis of left shoulder rheumatoid arthropathy.  They were brought to the operating room on 09/07/2018 and underwent Procedure(s): [place].    They were given perioperative antibiotics:  Anti-infectives (From admission, onward)   Start     Dose/Rate Route Frequency Ordered Stop   09/07/18 0600  ceFAZolin (ANCEF) IVPB 2g/100 mL premix     2 g 200 mL/hr over 30 Minutes Intravenous On call to O.R. 09/07/18 [id number] 09/07/18 0813    .  Patient underwent the above named procedure and tolerated it well. The following day they were hemodynamically stable and pain was controlled on oral analgesics. They were neurovascularly intact to the operative extremity. OT was ordered and worked with patient per protocol. They were medically and orthopaedically stable for discharge on .    DIAGNOSTIC STUDIES:  RECENT RADIOGRAPHIC STUDIES :  No results found.  RECENT VITAL SIGNS:   Patient Vitals for the past 24 hrs:  BP Temp Temp src Pulse Resp SpO2  09/08/18 0420 112/77 98.2 F (36.8 C) Oral 75 16 97 %  09/08/18 0059 114/72 98 F (36.7 C) Oral 72 16 98 %   09/07/18 2157 125/78 98.2 F (36.8 C) Oral 85 16 98 %  09/07/18 1828 128/87 98.9 F (37.2 C) - 77 16 100 %  09/07/18 1437 124/86 99.3 F (37.4 C) - 84 16 98 %  09/07/18 1318 132/82 98.7 F (37.1 C) Oral 87 16 98 %  09/07/18 1200 117/71 - - 77 - 100 %  09/07/18 1130 127/87 - - 81 16 100 %  09/07/18 1045 119/74 98.2 F (36.8 C) - 62 15 100 %  09/07/18 1030 116/77 - - 69 14 100 %  09/07/18 1015 118/87 - - 61 14 99 %  09/07/18 1000 127/88 - - 78 17 99 %  09/07/18 0945 130/86 - - 89 20 100 %  09/07/18 0942 128/87 98.4 F (36.9 C) - 81 15 100 %  .  RECENT EKG RESULTS:    Orders placed or performed during the hospital encounter of 11/22/15  . EKG 12-Lead  . EKG 12-Lead  . ED EKG within 10 minutes  . ED EKG within 10 minutes  . EKG    DISCHARGE INSTRUCTIONS:    DISCHARGE MEDICATIONS:   Allergies as of 09/08/2018      Reactions   Sulfa Antibiotics Itching, Rash   Head to toe   Teriparatide Other (See Comments)   Severe Muscle Pain, [place]   Valproic Acid  And Related Rash   Head to toe   Celexa [citalopram] Diarrhea      Leflunomide Diarrhea   Paroxetine Hcl Diarrhea   Zoloft [sertraline] Diarrhea      Medication List    TAKE these medications   Butalbital-APAP-Caffeine 50-325-40 MG capsule Take 1 capsule by mouth daily as needed for migraine.   CALCIUM+D3 PO Take 1 tablet by mouth daily.   cyclobenzaprine 10 MG tablet Commonly known as: FLEXERIL Take 1 tablet (10 mg total) by mouth 3 (three) times daily as needed for muscle spasms.   ibandronate 150 MG tablet Commonly known as: BONIVA Take 150 mg by mouth every 30 (thirty) days. Take in the morning with a full glass of water, on an empty stomach, and do not take anything else by mouth or lie down for the next 30 min.   ibuprofen 200 MG tablet Commonly known as: ADVIL Take 600-800 mg by mouth 2 (two) times daily as needed for headache, mild pain or moderate pain.   leucovorin 5 MG tablet Commonly known as:  WELLCOVORIN Take 5 mg by mouth daily.   methotrexate 250 MG/10ML injection Inject 10 mg into the skin every Saturday.   multivitamin with minerals Tabs tablet Take 1 tablet by mouth daily.   Olumiant 2 MG Tabs Generic drug: Baricitinib Take 1 tablet by mouth daily. What changed: how much to take   ondansetron 4 MG tablet Commonly known as: Zofran Take 1 tablet (4 mg total) by mouth every 8 (eight) hours as needed for nausea or vomiting.   oxyCODONE-acetaminophen 5-325 MG tablet Commonly known as: Percocet Take 1 tablet by mouth every 4 (four) hours as needed (max 6 q).   predniSONE 5 MG tablet Commonly known as: DELTASONE Take 5 mg by mouth every evening.   traMADol 50 MG tablet Commonly known as: ULTRAM Take 50 mg by mouth 2 (two) times daily.   Vitamin D3 50 MCG (2000 UT) Tabs Take 2,000 Units by mouth daily.       FOLLOW UP VISIT:   Follow-up Information    [name], MD.   Specialty: Orthopedic Surgery Why: call to be seen in 10-14 days Contact information: [address] [place] [address] [phone]           DISCHARGE TO Home   DISCHARGE CONDITION:  Thereasa Parkin Myha Arizpe for Dr. [name] 09/08/2018, 8:39 AM

## 2018-09-08 NOTE — Evaluation (Addendum)
Occupational Therapy Evaluation Patient Details Name: Anne Garrett MRN: [id number] DOB: 1975-10-03 Today's Date: 09/08/2018    History of Present Illness s/p Left shoulder reverse arthroplasty    Clinical Impression   Pt admitted with the above diagnoses and presents with below problem list. PTA pt was mod I to independent with ADLs. Pt is currently setup to min A with most ADLs. All shoulder education completed. Pt awaiting d/c home this morning. No further acute OT needs indicated.      Follow Up Recommendations  Follow surgeon's recommendation for DC plan and follow-up therapies    Equipment Recommendations  None recommended by OT    Recommendations for Other Services       Precautions / Restrictions Precautions Precautions: Shoulder Shoulder Interventions: Shoulder sling/immobilizer;Off for dressing/bathing/exercises;At all times(off in controlled environment) Precaution Booklet Issued: Yes (comment) Required Braces or Orthoses: Sling Restrictions Weight Bearing Restrictions: Yes LUE Weight Bearing: Non weight bearing      Mobility Bed Mobility               General bed mobility comments: sitting EOB, reports no difficulty with bed mobility  Transfers Overall transfer level: Modified independent Equipment used: None                  Balance Overall balance assessment: No apparent balance deficits (not formally assessed)                                         ADL either performed or assessed with clinical judgement   ADL Overall ADL's : Needs assistance/impaired Eating/Feeding: Set up;Sitting   Grooming: Set up;Minimal assistance;Sitting   Upper Body Bathing: Set up;Minimal assistance;Sitting   Lower Body Bathing: Min guard;Sit to/from stand   Upper Body Dressing : Set up;Minimal assistance;Sitting   Lower Body Dressing: Min guard;Sit to/from stand   Toilet Transfer: Supervision/safety   Toileting- Marine scientist and Hygiene: Supervision/safety;Min guard;With adaptive equipment;Sit to/from stand Toileting - Clothing Manipulation Details (indicate cue type and reason): educated on not using L hand for pericare. discussed AE and strategies Tub/ Shower Transfer: Walk-in shower;Supervision/safety   Functional mobility during ADLs: Supervision/safety General ADL Comments: Pt completed UB dressing. practiced donning/doffing sling and ice pads. Cues and reinforcement of education on compensatory strategies related to shoulder ROM restrictions     Vision         Perception     Praxis      Pertinent Vitals/Pain Pain Assessment: Faces Faces Pain Scale: Hurts a little bit Pain Location: tingling scapula, shoulder area Pain Descriptors / Indicators: Tingling Pain Intervention(s): Monitored during session     Hand Dominance Left   Extremity/Trunk Assessment Upper Extremity Assessment Upper Extremity Assessment: RUE deficits/detail;LUE deficits/detail RUE Deficits / Details: limited shoulder AROM at baseline, h/o failed extensor digits repair. impaired fine motor  LUE Deficits / Details: s/p left shoulder rheumatoid arthropathy with expected post op day #1 deficits. pt with nerve block beginning to wear off.    Lower Extremity Assessment Lower Extremity Assessment: Overall WFL for tasks assessed       Communication Communication Communication: No difficulties   Cognition Arousal/Alertness: Awake/alert Behavior During Therapy: WFL for tasks assessed/performed Overall Cognitive Status: Within Functional Limits for tasks assessed  General Comments       Exercises Exercises: Other exercises Other Exercises Other Exercises: e/w/h and forearm exercises and lap slides completed EOB. Pendulums demonstrated by therapist and advised pt to wait until tomorrow to begin that set of exercises until nerve block has completely worn off.    Shoulder Instructions      Home Living Family/patient expects to be discharged to:: Private residence Living Arrangements: Spouse/significant other Available Help at Discharge: Family   Home Access: Level entry           Bathroom Shower/Tub: Walk-in shower         Home Equipment: Shower seat;Grab bars - tub/shower          Prior Functioning/Environment Level of Independence: Independent(mod I)        Comments: mod I due to RA        OT Problem List: Impaired balance (sitting and/or standing);Decreased knowledge of use of DME or AE;Decreased knowledge of precautions;Pain;Impaired UE functional use      OT Treatment/Interventions:      OT Goals(Current goals can be found in the care plan section) Acute Rehab OT Goals Patient Stated Goal: be as independent as possible. return to daily activities as soon as possible.   OT Frequency:     Barriers to D/C:            Co-evaluation              AM-PAC OT "6 Clicks" Daily Activity     Outcome Measure Help from another person eating meals?: None Help from another person taking care of personal grooming?: None Help from another person toileting, which includes using toliet, bedpan, or urinal?: A Little Help from another person bathing (including washing, rinsing, drying)?: A Little Help from another person to put on and taking off regular upper body clothing?: A Little Help from another person to put on and taking off regular lower body clothing?: None 6 Click Score: 21   End of Session Equipment Utilized During Treatment: Other (comment)(sling)  Activity Tolerance: Patient tolerated treatment well Patient left: in bed;with call bell/phone within reach  OT Visit Diagnosis: Pain;Unsteadiness on feet (R26.81) Pain - Right/Left: Left Pain - part of body: Shoulder                Time: [id number] OT Time Calculation (min): 36 min Charges:  OT General Charges $OT Visit: 1 Visit OT Evaluation $OT Eval Low  Complexity: 1 Low OT Treatments $Self Care/Home Management : 8-22 mins  Tyrone Schimke, OT Acute Rehabilitation Services Pager: [phone] Office: [phone]   Hortencia Pilar 09/08/2018, 10:40 AM

## 2018-09-14 ENCOUNTER — Encounter (HOSPITAL_COMMUNITY): Payer: Self-pay | Admitting: Orthopedic Surgery

## 2018-09-18 MED FILL — METHOTREXATE 25 MG/ML VIAL: 50 | 30 days supply | Qty: 2 | Fill #1

## 2018-09-18 MED FILL — IBANDRONATE SODIUM 150 MG T: 150 | 90 days supply | Qty: 3 | Fill #0

## 2018-09-22 DIAGNOSIS — Z471 Aftercare following joint replacement surgery: Secondary | ICD-10-CM | POA: Diagnosis not present

## 2018-09-22 DIAGNOSIS — Z4789 Encounter for other orthopedic aftercare: Secondary | ICD-10-CM | POA: Diagnosis not present

## 2018-09-22 MED FILL — traMADol HCL 50 MG TABS: 50 | 30 days supply | Qty: 60 | Fill #1

## 2018-09-25 DIAGNOSIS — M545 Low back pain: Secondary | ICD-10-CM | POA: Diagnosis not present

## 2018-09-25 DIAGNOSIS — M0579 Rheumatoid arthritis with rheumatoid factor of multiple sites without organ or systems involvement: Secondary | ICD-10-CM | POA: Diagnosis not present

## 2018-09-25 DIAGNOSIS — M810 Age-related osteoporosis without current pathological fracture: Secondary | ICD-10-CM | POA: Diagnosis not present

## 2018-09-25 DIAGNOSIS — M25572 Pain in left ankle and joints of left foot: Secondary | ICD-10-CM | POA: Diagnosis not present

## 2018-09-25 DIAGNOSIS — Z79899 Other long term (current) drug therapy: Secondary | ICD-10-CM | POA: Diagnosis not present

## 2018-09-25 DIAGNOSIS — M2550 Pain in unspecified joint: Secondary | ICD-10-CM | POA: Diagnosis not present

## 2018-09-25 DIAGNOSIS — M25512 Pain in left shoulder: Secondary | ICD-10-CM | POA: Diagnosis not present

## 2018-09-25 DIAGNOSIS — M150 Primary generalized (osteo)arthritis: Secondary | ICD-10-CM | POA: Diagnosis not present

## 2018-09-27 MED FILL — predniSONE 5 MG TABS: 5 | 90 days supply | Qty: 90 | Fill #1

## 2018-10-02 MED FILL — LEUCOVORIN CALCIUM 5 MG TAB: 5 | 30 days supply | Qty: 30 | Fill #1

## 2018-10-02 MED FILL — OLUMIANT 2 MG TABS: 2 | 30 days supply | Qty: 30 | Fill #1

## 2018-10-09 DIAGNOSIS — M25512 Pain in left shoulder: Secondary | ICD-10-CM | POA: Diagnosis not present

## 2018-10-09 DIAGNOSIS — M545 Low back pain: Secondary | ICD-10-CM | POA: Diagnosis not present

## 2018-10-16 MED FILL — METHOTREXATE 25 MG/ML VIAL: 50 | 30 days supply | Qty: 2 | Fill #2

## 2018-10-23 DIAGNOSIS — M25512 Pain in left shoulder: Secondary | ICD-10-CM | POA: Diagnosis not present

## 2018-10-23 DIAGNOSIS — M545 Low back pain: Secondary | ICD-10-CM | POA: Diagnosis not present

## 2018-10-24 MED FILL — traMADol HCL 50 MG TABS: 50 | 10 days supply | Qty: 60 | Fill #0

## 2018-10-30 MED FILL — OLUMIANT 2 MG TABS: 2 | 30 days supply | Qty: 30 | Fill #2

## 2018-11-03 DIAGNOSIS — M810 Age-related osteoporosis without current pathological fracture: Secondary | ICD-10-CM | POA: Diagnosis not present

## 2018-11-03 DIAGNOSIS — R519 Headache, unspecified: Secondary | ICD-10-CM | POA: Diagnosis not present

## 2018-11-04 MED FILL — LEUCOVORIN CALCIUM 5 MG TAB: 5 | 30 days supply | Qty: 30 | Fill #2

## 2018-11-07 ENCOUNTER — Other Ambulatory Visit: Payer: Self-pay | Admitting: Family Medicine

## 2018-11-07 DIAGNOSIS — M810 Age-related osteoporosis without current pathological fracture: Secondary | ICD-10-CM

## 2018-11-13 MED FILL — METHOTREXATE 25 MG/ML VIAL: 50 | 28 days supply | Qty: 2 | Fill #0

## 2018-11-13 MED FILL — BUTALB-ACETAMIN-CAFF 50-325: 50-325-40 | 5 days supply | Qty: 30 | Fill #0

## 2018-11-24 MED FILL — traMADol HCL 50 MG TABS: 50 | 10 days supply | Qty: 60 | Fill #1

## 2018-11-27 DIAGNOSIS — M79672 Pain in left foot: Secondary | ICD-10-CM | POA: Diagnosis not present

## 2018-11-27 DIAGNOSIS — M21622 Bunionette of left foot: Secondary | ICD-10-CM | POA: Diagnosis not present

## 2018-11-27 DIAGNOSIS — Z96612 Presence of left artificial shoulder joint: Secondary | ICD-10-CM | POA: Diagnosis not present

## 2018-11-27 DIAGNOSIS — M0600 Rheumatoid arthritis without rheumatoid factor, unspecified site: Secondary | ICD-10-CM | POA: Diagnosis not present

## 2018-11-27 DIAGNOSIS — Z471 Aftercare following joint replacement surgery: Secondary | ICD-10-CM | POA: Diagnosis not present

## 2018-11-28 MED FILL — OLUMIANT 2 MG TABS: 2 | 30 days supply | Qty: 30 | Fill #3

## 2018-12-05 DIAGNOSIS — Z760 Encounter for issue of repeat prescription: Secondary | ICD-10-CM | POA: Diagnosis not present

## 2018-12-05 MED FILL — LEUCOVORIN CALCIUM 5 MG TAB: 5 | 30 days supply | Qty: 30 | Fill #0

## 2018-12-05 MED FILL — IBANDRONATE NA 150 MG TAB: 150 | 90 days supply | Qty: 3 | Fill #0

## 2018-12-11 MED FILL — METHOTREXATE 25 MG/ML VIAL: 50 | 28 days supply | Qty: 2 | Fill #1

## 2018-12-15 DIAGNOSIS — H5213 Myopia, bilateral: Secondary | ICD-10-CM | POA: Diagnosis not present

## 2018-12-19 MED FILL — predniSONE 5 MG TABS: 5 | 90 days supply | Qty: 90 | Fill #0

## 2018-12-27 MED FILL — traMADol HCL 50 MG TABS: 50 | 10 days supply | Qty: 60 | Fill #2

## 2019-01-01 MED FILL — AMOXICILLIN 500 MG CAPSULE: 500 | 30 days supply | Qty: 20 | Fill #0

## 2019-01-04 DIAGNOSIS — Z760 Encounter for issue of repeat prescription: Secondary | ICD-10-CM | POA: Diagnosis not present

## 2019-01-08 MED FILL — LEUCOVORIN CALCIUM 5 MG TAB: 5 | 30 days supply | Qty: 30 | Fill #1

## 2019-01-08 MED FILL — METHOTREXATE 25 MG/ML VIAL: 50 | 28 days supply | Qty: 2 | Fill #2

## 2019-01-11 DIAGNOSIS — M19079 Primary osteoarthritis, unspecified ankle and foot: Secondary | ICD-10-CM | POA: Diagnosis not present

## 2019-01-11 DIAGNOSIS — M069 Rheumatoid arthritis, unspecified: Secondary | ICD-10-CM | POA: Diagnosis not present

## 2019-01-11 DIAGNOSIS — M79672 Pain in left foot: Secondary | ICD-10-CM | POA: Diagnosis not present

## 2019-01-16 MED FILL — BUTALBIT-ACETAMINOPHEN-CAFF: 50-325-40 | 5 days supply | Qty: 30 | Fill #0

## 2019-01-17 ENCOUNTER — Other Ambulatory Visit: Payer: Self-pay | Admitting: Pharmacist

## 2019-01-17 MED ORDER — OLUMIANT 2 MG PO TABS: 2.0000 mg | ORAL_TABLET | Freq: Every day | ORAL | 2 refills | Status: DC

## 2019-01-17 MED FILL — OLUMIANT 2 MG TABS: 2 | 30 days supply | Qty: 30 | Fill #0

## 2019-01-18 ENCOUNTER — Other Ambulatory Visit: Payer: 59

## 2019-01-18 DIAGNOSIS — M25572 Pain in left ankle and joints of left foot: Secondary | ICD-10-CM | POA: Diagnosis not present

## 2019-01-18 DIAGNOSIS — M0600 Rheumatoid arthritis without rheumatoid factor, unspecified site: Secondary | ICD-10-CM | POA: Diagnosis not present

## 2019-01-25 MED FILL — traMADol HCL 50 MG TABS: 50 | 10 days supply | Qty: 60 | Fill #0

## 2019-01-29 DIAGNOSIS — Z79899 Other long term (current) drug therapy: Secondary | ICD-10-CM | POA: Diagnosis not present

## 2019-01-29 DIAGNOSIS — M810 Age-related osteoporosis without current pathological fracture: Secondary | ICD-10-CM | POA: Diagnosis not present

## 2019-01-29 DIAGNOSIS — M25512 Pain in left shoulder: Secondary | ICD-10-CM | POA: Diagnosis not present

## 2019-01-29 DIAGNOSIS — M150 Primary generalized (osteo)arthritis: Secondary | ICD-10-CM | POA: Diagnosis not present

## 2019-01-29 DIAGNOSIS — M25572 Pain in left ankle and joints of left foot: Secondary | ICD-10-CM | POA: Diagnosis not present

## 2019-01-29 DIAGNOSIS — M2550 Pain in unspecified joint: Secondary | ICD-10-CM | POA: Diagnosis not present

## 2019-01-29 DIAGNOSIS — M19272 Secondary osteoarthritis, left ankle and foot: Secondary | ICD-10-CM | POA: Diagnosis not present

## 2019-01-29 DIAGNOSIS — M0579 Rheumatoid arthritis with rheumatoid factor of multiple sites without organ or systems involvement: Secondary | ICD-10-CM | POA: Diagnosis not present

## 2019-01-30 DIAGNOSIS — R87810 Cervical high risk human papillomavirus (HPV) DNA test positive: Secondary | ICD-10-CM | POA: Diagnosis not present

## 2019-01-30 DIAGNOSIS — Z01419 Encounter for gynecological examination (general) (routine) without abnormal findings: Secondary | ICD-10-CM | POA: Diagnosis not present

## 2019-01-30 DIAGNOSIS — Z1231 Encounter for screening mammogram for malignant neoplasm of breast: Secondary | ICD-10-CM | POA: Diagnosis not present

## 2019-01-30 DIAGNOSIS — Z124 Encounter for screening for malignant neoplasm of cervix: Secondary | ICD-10-CM | POA: Diagnosis not present

## 2019-01-30 MED FILL — METHOTREXATE 25 MG/ML VIAL: 50 | 90 days supply | Qty: 6 | Fill #0

## 2019-01-31 ENCOUNTER — Other Ambulatory Visit: Payer: Self-pay | Admitting: Obstetrics and Gynecology

## 2019-01-31 DIAGNOSIS — R928 Other abnormal and inconclusive findings on diagnostic imaging of breast: Secondary | ICD-10-CM

## 2019-02-01 MED FILL — LEUCOVORIN CALCIUM 5 MG TAB: 5 | 90 days supply | Qty: 90 | Fill #0

## 2019-02-02 ENCOUNTER — Ambulatory Visit
Admission: RE | Admit: 2019-02-02 | Discharge: 2019-02-02 | Disposition: A | Discharge status: Home / Self Care | Payer: 59 | Source: Ambulatory Visit | Attending: Obstetrics and Gynecology | Admitting: Obstetrics and Gynecology

## 2019-02-02 ENCOUNTER — Other Ambulatory Visit: Payer: Self-pay

## 2019-02-02 DIAGNOSIS — R928 Other abnormal and inconclusive findings on diagnostic imaging of breast: Secondary | ICD-10-CM

## 2019-02-02 DIAGNOSIS — N6489 Other specified disorders of breast: Secondary | ICD-10-CM | POA: Diagnosis not present

## 2019-02-05 ENCOUNTER — Other Ambulatory Visit (HOSPITAL_COMMUNITY): Payer: Self-pay | Admitting: Orthopedic Surgery

## 2019-02-07 DIAGNOSIS — R509 Fever, unspecified: Secondary | ICD-10-CM | POA: Diagnosis not present

## 2019-02-07 DIAGNOSIS — J029 Acute pharyngitis, unspecified: Secondary | ICD-10-CM | POA: Diagnosis not present

## 2019-02-07 DIAGNOSIS — R112 Nausea with vomiting, unspecified: Secondary | ICD-10-CM | POA: Diagnosis not present

## 2019-02-07 DIAGNOSIS — K0510 Chronic gingivitis, plaque induced: Secondary | ICD-10-CM | POA: Diagnosis not present

## 2019-02-08 DIAGNOSIS — R509 Fever, unspecified: Secondary | ICD-10-CM | POA: Diagnosis not present

## 2019-02-08 DIAGNOSIS — Z1152 Encounter for screening for COVID-19: Secondary | ICD-10-CM | POA: Diagnosis not present

## 2019-02-08 DIAGNOSIS — J029 Acute pharyngitis, unspecified: Secondary | ICD-10-CM | POA: Diagnosis not present

## 2019-02-10 ENCOUNTER — Emergency Department (HOSPITAL_COMMUNITY)
Admission: EM | Admit: 2019-02-10 | Discharge: 2019-02-11 | Disposition: A | Discharge status: Home / Self Care | Payer: 59 | Attending: Emergency Medicine | Admitting: Emergency Medicine

## 2019-02-10 ENCOUNTER — Other Ambulatory Visit: Payer: Self-pay

## 2019-02-10 ENCOUNTER — Emergency Department (HOSPITAL_COMMUNITY): Payer: 59

## 2019-02-10 DIAGNOSIS — R509 Fever, unspecified: Secondary | ICD-10-CM | POA: Diagnosis not present

## 2019-02-10 DIAGNOSIS — Z96643 Presence of artificial hip joint, bilateral: Secondary | ICD-10-CM | POA: Diagnosis not present

## 2019-02-10 DIAGNOSIS — Z87891 Personal history of nicotine dependence: Secondary | ICD-10-CM | POA: Diagnosis not present

## 2019-02-10 DIAGNOSIS — K069 Disorder of gingiva and edentulous alveolar ridge, unspecified: Secondary | ICD-10-CM | POA: Diagnosis not present

## 2019-02-10 DIAGNOSIS — Z79899 Other long term (current) drug therapy: Secondary | ICD-10-CM | POA: Insufficient documentation

## 2019-02-10 DIAGNOSIS — R791 Abnormal coagulation profile: Secondary | ICD-10-CM | POA: Diagnosis not present

## 2019-02-10 DIAGNOSIS — R112 Nausea with vomiting, unspecified: Secondary | ICD-10-CM | POA: Diagnosis not present

## 2019-02-10 DIAGNOSIS — R000 Tachycardia, unspecified: Secondary | ICD-10-CM | POA: Diagnosis not present

## 2019-02-10 DIAGNOSIS — R590 Localized enlarged lymph nodes: Secondary | ICD-10-CM | POA: Diagnosis not present

## 2019-02-10 LAB — COMPREHENSIVE METABOLIC PANEL
ALT: 21 U/L (ref 0–44)
AST: 26 U/L (ref 15–41)
Albumin: 3.7 g/dL (ref 3.5–5.0)
Alkaline Phosphatase: 67 U/L (ref 38–126)
Anion gap: 15 (ref 5–15)
BUN: 5 mg/dL — ABNORMAL LOW (ref 6–20)
CO2: 21 mmol/L — ABNORMAL LOW (ref 22–32)
Calcium: 9 mg/dL (ref 8.9–10.3)
Chloride: 103 mmol/L (ref 98–111)
Creatinine, Ser: 0.69 mg/dL (ref 0.44–1.00)
GFR calc Af Amer: >60 mL/min (ref >60)
GFR calc non Af Amer: >60 mL/min (ref >60)
Glucose, Bld: 108 mg/dL — ABNORMAL HIGH (ref 70–99)
Potassium: 3.7 mmol/L (ref 3.5–5.1)
Sodium: 139 mmol/L (ref 135–145)
Total Bilirubin: 0.4 mg/dL (ref 0.3–1.2)
Total Protein: 6.9 g/dL (ref 6.5–8.1)

## 2019-02-10 LAB — PROTIME-INR
INR: 1.1 (ref 0.8–1.2)
Prothrombin Time: 14.2 seconds (ref 11.4–15.2)

## 2019-02-10 LAB — CBC
HCT: 40.8 % (ref 36.0–46.0)
Hemoglobin: 13.1 g/dL (ref 12.0–15.0)
MCH: 33.9 pg (ref 26.0–34.0)
MCHC: 32.1 g/dL (ref 30.0–36.0)
MCV: 105.7 fL — ABNORMAL HIGH (ref 80.0–100.0)
Platelets: 387 10*3/uL (ref 150–400)
RBC: 3.86 MIL/uL — ABNORMAL LOW (ref 3.87–5.11)
RDW: 13.8 % (ref 11.5–15.5)
WBC: 9.2 10*3/uL (ref 4.0–10.5)
nRBC: 0 % (ref 0.0–0.2)

## 2019-02-10 LAB — APTT: aPTT: 33 seconds (ref 24–36)

## 2019-02-10 LAB — LACTIC ACID, PLASMA: Lactic Acid, Venous: 1.8 mmol/L (ref 0.5–1.9)

## 2019-02-10 LAB — I-STAT BETA HCG BLOOD, ED (MC, WL, AP ONLY): I-stat hCG, quantitative: <5 m[IU]/mL (ref <5)

## 2019-02-10 LAB — LIPASE, BLOOD: Lipase: 32 U/L (ref 11–51)

## 2019-02-10 MED ORDER — ONDANSETRON HCL 4 MG/2ML IJ SOLN
INTRAMUSCULAR | Status: AC
  Filled 2019-02-10: qty 4

## 2019-02-10 MED ORDER — SODIUM CHLORIDE 0.9 % IV SOLN
8.0000 mg | Freq: Once | INTRAVENOUS | Status: AC
  Administered 2019-02-11: 8 mg via INTRAVENOUS
  Filled 2019-02-10: qty 4

## 2019-02-10 MED ORDER — ACETAMINOPHEN 325 MG PO TABS
650.0000 mg | ORAL_TABLET | Freq: Once | ORAL | Status: AC
  Administered 2019-02-11: 650 mg via ORAL
  Filled 2019-02-10: qty 2

## 2019-02-10 MED ORDER — SODIUM CHLORIDE 0.9% FLUSH
3.0000 mL | Freq: Once | INTRAVENOUS | Status: AC
  Administered 2019-02-10: 3 mL via INTRAVENOUS

## 2019-02-10 MED ORDER — SODIUM CHLORIDE 0.9 % IV SOLN
8.0000 mg | Freq: Once | INTRAVENOUS | Status: AC
  Administered 2019-02-10: 8 mg via INTRAVENOUS
  Filled 2019-02-10: qty 4

## 2019-02-10 MED ORDER — PREDNISONE 5 MG PO TABS
5.0000 mg | ORAL_TABLET | Freq: Once | ORAL | Status: AC
  Administered 2019-02-11: 5 mg via ORAL
  Filled 2019-02-10: qty 1

## 2019-02-10 MED ORDER — SODIUM CHLORIDE 0.9 % IV SOLN
3.0000 g | Freq: Once | INTRAVENOUS | Status: AC
  Administered 2019-02-11: 3 g via INTRAVENOUS
  Filled 2019-02-10: qty 3

## 2019-02-10 MED ORDER — IOHEXOL 300 MG/ML  SOLN
75.0000 mL | Freq: Once | INTRAMUSCULAR | Status: AC | PRN
  Administered 2019-02-10: 75 mL via INTRAVENOUS

## 2019-02-10 MED ORDER — LACTATED RINGERS IV BOLUS (SEPSIS)
1000.0000 mL | Freq: Once | INTRAVENOUS | Status: AC
  Administered 2019-02-10: 1000 mL via INTRAVENOUS

## 2019-02-10 NOTE — ED Provider Notes (Addendum)
[facility] EMERGENCY DEPARTMENT Provider Note   CSN: [id number] Arrival date & time: 02/10/19  2054     History Chief Complaint  Patient presents with  . Emesis    Anne Garrett is a 44 y.o. female.  HPI     44 year old female comes in a chief complaint of vomiting, fevers, chills.  Patient has history of rheumatoid arthritis and she is on prednisone and also methotrexate.  Recently her methotrexate was increased.  Last week patient started noticing some discomfort in her mouth.  Over time she has developed couple of areas over her gums that are oozing purulent material and also she has increased swelling in her throat.  She spoke to her PCP and was started on amoxicillin 2 days ago.  Despite taking the medications, she has noted increased swelling to her face, and persistence of fevers and chills.  Patient denies any difficulty in breathing, difficulty in swallowing. Review of system is positive for nausea and vomiting.  Patient feels like she might be dehydrated.   Past Medical History:  Diagnosis Date  . Adrenal insufficiency ([place])    secondary to Prednisone  . Eczema    coner oif eye lid  . Headache    Migraine  . Heart murmur 2012   -? heard at PCP visit 10-15 years ago-had ECHO -benign  . Intervertebral disk disease   . Osteoporosis   . Rheumatoid arthritis ([place])    Juvenile onset    Patient Active Problem List   Diagnosis Date Noted  . 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 Lumbar pseudoarthrosis 06/04/2016  . Lumbar stenosis 04/08/2016  . S/P cervical spinal fusion 07/13/2015  . Cervical stenosis of spinal canal 07/12/2015  . HNP (herniated nucleus pulposus), cervical 06/19/2015  . Numbness 05/20/2015  . Chronic rheumatic arthritis ([place]) 05/20/2015  . Carpal tunnel syndrome 05/20/2015  . Snoring 05/20/2015  . Excessive daytime sleepiness 05/20/2015    Past Surgical History:  Procedure Laterality Date  .  ANKLE ARTHROPLASTY Right   . ANTERIOR CERVICAL DECOMP/DISCECTOMY FUSION N/A 06/19/2015   Procedure: C3-4 C4-5 Anterior cervical decompression/diskectomy/fusion;  Surgeon: Jovita Gamma, MD;  Location: [place] [facility];  Service: Neurosurgery;  Laterality: N/A;  C3-4 C4-5 Anterior cervical decompression/diskectomy/fusion  . APPENDECTOMY    . CARPAL TUNNEL RELEASE Right   . CESAREAN SECTION    . CHOLECYSTECTOMY    . COLONOSCOPY    . ESSURE TUBAL LIGATION     Essure Implant-Permanent birth control  . excision to palm Left    excision of mass to right plam  . HARDWARE REMOVAL Right 02/02/2017   Procedure: RIGHT WRIST DEEP IMPLANT REMOVAL, RIGHT WRIST AND HAND EXTENSOR TENDON RECONSTRUCTION, TENDON TRANSFER AND RECONSTRUCTION AS INDICATED;  Surgeon: Iran Planas, MD;  Location: [place];  Service: Orthopedics;  Laterality: Right;  . JOINT REPLACEMENT Bilateral    bilat hip replacements  . ORIF FEMUR FRACTURE Right   . OTHER SURGICAL HISTORY  06/04/2016   Revision of PLIF  . plif l5-s1 arthrodeisis    2018  . POSTERIOR CERVICAL FUSION/FORAMINOTOMY N/A 07/12/2015   Procedure: C2 to C5 Cervical laminectomy, C2 to C5 posterior cervical arthrodesis with instrumentation and bone graft;  Surgeon: Jovita Gamma, MD;  Location: [place] [facility];  Service: Neurosurgery;  Laterality: N/A;  C2 to C5 Cervical laminectomy, C2  to C5 posterior cervical arthrodesis with instrumentation and bone graft  . REPAIR EXTENSOR TENDON Right 02/02/2017   Procedure: REPAIR EXTENSOR TENDON;  Surgeon:  Iran Planas, MD;  Location: [place];  Service: Orthopedics;  Laterality: Right;  . REVERSE SHOULDER ARTHROPLASTY Left 09/07/2018   Procedure: REVERSE SHOULDER ARTHROPLASTY;  Surgeon: [name], MD;  Location: [facility];  Service: Orthopedics;  Laterality: Left;  . right foot surgery Right    right jfoot arthrodesis and right ankle arthroplasty  . WRIST SURGERY Bilateral    arthrodesis     OB History   No obstetric history on  file.     Family History  Problem Relation Age of Onset  . Breast cancer Mother 34  . Nephritis Father   . Epilepsy Father   . Breast cancer Maternal Aunt        2 mat aunts   . Multiple sclerosis Maternal Uncle   . Psoriasis Paternal Aunt     Social History   Tobacco Use  . Smoking status: Former Smoker    Packs/day: 0.25    Years: 20.00    Pack years: 5.00    Types: Cigarettes    Quit date: 07/02/2015    Years since quitting: 3.6  . Smokeless tobacco: Never Used  Substance Use Topics  . Alcohol use: Yes    Alcohol/week: 2.0 standard drinks    Types: 2 Glasses of wine per week    Comment: social  . Drug use: No    Home Medications Prior to Admission medications   Medication Sig Start Date End Date Taking? Authorizing Provider  baricitinib (OLUMIANT) 2 MG TABS tablet Take 1 tablet (2 mg total) by mouth daily. 01/17/19  Yes Tresa Garter, MD  Butalbital-APAP-Caffeine [phone] MG capsule Take 1 capsule by mouth daily as needed for migraine. 05/15/18  Yes [provider]  Calcium Carb-Cholecalciferol (CALCIUM+D3 PO) Take 1 tablet by mouth daily.   Yes [provider]  Cholecalciferol (VITAMIN D3) 50 MCG (2000 UT) TABS Take 2,000 Units by mouth daily.   Yes [provider]  ibandronate (BONIVA) 150 MG tablet Take 150 mg by mouth every 30 (thirty) days. Take in the morning with a full glass of water, on an empty stomach, and do not take anything else by mouth or lie down for the next 30 min.   Yes [provider]  ibuprofen (ADVIL,MOTRIN) 200 MG tablet Take 600-800 mg by mouth 2 (two) times daily as needed for headache, mild pain or moderate pain.    Yes [provider]  leucovorin (WELLCOVORIN) 5 MG tablet Take 5 mg by mouth daily. 07/27/18  Yes [provider]  methotrexate 250 MG/10ML injection Inject 10 mg into the skin every Saturday. 07/27/18  Yes [provider]  Multiple Vitamin (MULTIVITAMIN WITH  MINERALS) TABS tablet Take 1 tablet by mouth daily.   Yes [provider]  predniSONE (DELTASONE) 5 MG tablet Take 5 mg by mouth every evening.    Yes [provider]  traMADol (ULTRAM) 50 MG tablet Take 50 mg by mouth 2 (two) times daily.  07/27/18  Yes [provider]  cephALEXin (KEFLEX) 500 MG capsule Take 1 capsule (500 mg total) by mouth 3 (three) times daily. 02/11/19   Varney Biles, MD  cyclobenzaprine (FLEXERIL) 10 MG tablet Take 1 tablet (10 mg total) by mouth 3 (three) times daily as needed for muscle spasms. Patient not taking: Reported on 02/10/2019 09/08/18   Shuford, Olivia Mackie, PA-C  ondansetron (ZOFRAN ODT) 8 MG disintegrating tablet Take 1 tablet (8 mg total) by mouth every 8 (eight) hours as needed for nausea. 02/11/19   Satrina Magallanes,  Danese Dorsainvil, MD  ondansetron (ZOFRAN) 4 MG tablet Take 1 tablet (4 mg total) by mouth every 8 (eight) hours as needed for nausea or vomiting. Patient not taking: Reported on 02/10/2019 09/08/18   Shuford, Olivia Mackie, PA-C  oxyCODONE-acetaminophen (PERCOCET) 5-325 MG tablet Take 1 tablet by mouth every 4 (four) hours as needed (max 6 q). Patient not taking: Reported on 02/10/2019 09/08/18   Shuford, Olivia Mackie, PA-C  promethazine (PHENERGAN) 25 MG suppository Place 1 suppository (25 mg total) rectally every 6 (six) hours as needed for nausea or vomiting. 02/11/19   Varney Biles, MD    Allergies    Sulfa antibiotics, Teriparatide, Valproic acid and related, Celexa [citalopram], Leflunomide, Paroxetine hcl, and Zoloft [sertraline]  Review of Systems   Review of Systems  Constitutional: Positive for activity change.  HENT: Positive for facial swelling. Negative for sore throat, trouble swallowing and voice change.   Gastrointestinal: Positive for nausea and vomiting.  Allergic/Immunologic: Positive for immunocompromised state.  Hematological: Does not bruise/bleed easily.  All other systems reviewed and are negative.   Physical Exam Updated Vital  Signs BP 114/78 (BP Location: Right Arm)   Pulse (!) 101   Temp (!) 101 F (38.3 C) (Oral)   Resp 19   Ht 4\' 11"  (1.499 m)   Wt 55.3 kg   SpO2 99%   BMI 24.64 kg/m   Physical Exam Vitals and nursing note reviewed.  Constitutional:      Appearance: She is well-developed.  HENT:     Head: Normocephalic and atraumatic.     Mouth/Throat:     Comments: Gingiva is irritated there is no gingival abscess or trismus Eyes:     Pupils: Pupils are equal, round, and reactive to light.  Cardiovascular:     Rate and Rhythm: Normal rate and regular rhythm.     Heart sounds: Normal heart sounds. No murmur.  Pulmonary:     Effort: Pulmonary effort is normal. No respiratory distress.  Abdominal:     General: There is no distension.     Palpations: Abdomen is soft.     Tenderness: There is no abdominal tenderness. There is no guarding or rebound.  Musculoskeletal:     Cervical back: No rigidity.  Lymphadenopathy:     Cervical: Cervical adenopathy present.  Skin:    General: Skin is warm and dry.  Neurological:     Mental Status: She is alert and oriented to person, place, and time.     ED Results / Procedures / Treatments   Labs (all labs ordered are listed, but only abnormal results are displayed) Labs Reviewed  COMPREHENSIVE METABOLIC PANEL - Abnormal; Notable for the following components:      Result Value   CO2 21 (*)    Glucose, Bld 108 (*)    BUN 5 (*)    All other components within normal limits  CBC - Abnormal; Notable for the following components:   RBC 3.86 (*)    MCV 105.7 (*)    All other components within normal limits  CULTURE, BLOOD (ROUTINE X 2)  CULTURE, BLOOD (ROUTINE X 2)  URINE CULTURE  LIPASE, BLOOD  LACTIC ACID, PLASMA  APTT  PROTIME-INR  URINALYSIS, ROUTINE W REFLEX MICROSCOPIC  LACTIC ACID, PLASMA  I-STAT BETA HCG BLOOD, ED ([facility], [facility], [facility] ONLY)    EKG EKG Interpretation  Date/Time:  Saturday February 10 2019 23:02:53 EST Ventricular Rate:   97 PR Interval:    QRS Duration: 78 QT Interval:  342 QTC Calculation: 435  R Axis:   41 Text Interpretation: Sinus rhythm LAE, consider biatrial enlargement No acute changes Confirmed by Varney Biles [id number]) on 02/10/2019 11:18:04 PM   Radiology CT Soft Tissue Neck W Contrast  Result Date: 02/10/2019 CLINICAL DATA:  Immunosuppressed patient. Gingival purulence. Right facial swelling. EXAM: CT NECK WITH CONTRAST TECHNIQUE: Multidetector CT imaging of the neck was performed using the standard protocol following the bolus administration of intravenous contrast. CONTRAST:  48mL OMNIPAQUE IOHEXOL 300 MG/ML  SOLN COMPARISON:  None. FINDINGS: Pharynx and larynx: No evidence of mucosal or submucosal lesion of the nasopharynx, oropharynx or laryngeal region. There may be mild tonsillar prominence without evidence of tonsillar or peritonsillar abscess. Salivary glands: Parotid and submandibular glands are normal. Thyroid: Normal Lymph nodes: No markedly enlarged or separated lymph nodes. Mild bilateral reactive level 1 and level 2 nodal prominence. Vascular: No abnormal vascular finding. Limited intracranial: Normal Visualized orbits: Normal Mastoids and visualized paranasal sinuses: Clear Skeleton: Previous cervical fusion from C2 through C5. Degenerative change at the C1-2 articulation, right more than left. There is dental decay a but no evidence of advanced periodontal disease presently by CT. Upper chest: Negative Other: No evidence of facial abscess. IMPRESSION: No advanced finding by CT. No evidence of abscess or advanced cellulitis. Mild reactive nodal prominence of the level 1 and level 2 nodes without suppuration. This is probably in reaction to the described gum inflammation. Electronically Signed   By: Nelson Chimes M.D.   On: 02/10/2019 23:41    Procedures Procedures (including critical care time)  Medications Ordered in ED Medications  predniSONE (DELTASONE) tablet 5 mg (has no administration  in time range)  ondansetron (ZOFRAN) 8 mg in sodium chloride 0.9 % 50 mL IVPB (has no administration in time range)  Ampicillin-Sulbactam (UNASYN) 3 g in sodium chloride 0.9 % 100 mL IVPB (has no administration in time range)  sodium chloride flush (NS) 0.9 % injection 3 mL (3 mLs Intravenous Given 02/10/19 2150)  lactated ringers bolus 1,000 mL ( Intravenous Restarted 02/11/19 0021)  ondansetron (ZOFRAN) 8 mg in sodium chloride 0.9 % 50 mL IVPB (0 mg Intravenous Stopped 02/10/19 2351)  iohexol (OMNIPAQUE) 300 MG/ML solution 75 mL (75 mLs Intravenous Contrast Given 02/10/19 2310)  acetaminophen (TYLENOL) tablet 650 mg (650 mg Oral Given 02/11/19 0022)    ED Course  I have reviewed the triage vital signs and the nursing notes.  Pertinent labs & imaging results that were available during my care of the patient were reviewed by me and considered in my medical decision making (see chart for details).  Clinical Course as of Feb 11 35  Sat Feb 10, 2019  2356 CT scan is reassuring. We will give her IV Unasyn.  Oral challenge initiated.  Once the IV infusion is completed and oral challenge has passed she can be discharged.  CT Soft Tissue Neck W Contrast [AN]  Sun Feb 11, 2019  [id number] We do not think patient has SJS or TE N. There is no ocular involvement, no skin involvement besides the mucosa. No recent IV placement or instrumentation to increase risk for bacteremia.  Blood cultures will be sent out to screen for endocarditis.  review shows that patient penicillin can increase the serum concentrate of methotrexate therefore we will discharge her with Keflex instead of amoxi.  If patient has not passed oral challenge that she will need admission.   [AN]  0037 Strict ER return precautions have been discussed, and patient is agreeing with the plan and  is comfortable with the workup done and the recommendations from the ER.    [AN]    Clinical Course User Index [AN] Varney Biles, MD   MDM  Rules/Calculators/A&P                      Immunosuppressed patient comes in a chief complaint of pain in her throat, swelling in her throat, abscess in the mouth and right-sided facial swelling.  She has history of RA and is on methotrexate and prednisone.  She is tachycardic, febrile.  She is having nausea and vomiting which is likely contributing to her tachycardia.  Given the SIRS criteria and immunosuppressed status we will activate sepsis work-up.  She will need a CT scan of her neck to rule out any deep space infection.  She is on amoxicillin right now but we will probably change her antibiotics to Augmentin. IV fluid will be given in the ER.  She will need oral challenge before being discharged.  Final Clinical Impression(s) / ED Diagnoses Final diagnoses:  Non-intractable vomiting with nausea, unspecified vomiting type  Gingiva disorder  Fever, unspecified fever cause    Rx / DC Orders ED Discharge Orders         Ordered    ondansetron (ZOFRAN ODT) 8 MG disintegrating tablet  Every 8 hours PRN     02/11/19 0034    promethazine (PHENERGAN) 25 MG suppository  Every 6 hours PRN     02/11/19 0034    cephALEXin (KEFLEX) 500 MG capsule  3 times daily     02/11/19 0034           Varney Biles, MD 02/10/19 [id number]    Varney Biles, MD 02/11/19 [id number]

## 2019-02-10 NOTE — ED Triage Notes (Signed)
Pt reports she has had vomiting for several times.  She says she was recently started on Amoxicillin for gingivitis. Last tylenol for temp around 1400 today.

## 2019-02-11 DIAGNOSIS — K069 Disorder of gingiva and edentulous alveolar ridge, unspecified: Secondary | ICD-10-CM | POA: Diagnosis not present

## 2019-02-11 DIAGNOSIS — R509 Fever, unspecified: Secondary | ICD-10-CM | POA: Diagnosis not present

## 2019-02-11 DIAGNOSIS — R791 Abnormal coagulation profile: Secondary | ICD-10-CM | POA: Diagnosis not present

## 2019-02-11 DIAGNOSIS — Z96643 Presence of artificial hip joint, bilateral: Secondary | ICD-10-CM | POA: Diagnosis not present

## 2019-02-11 DIAGNOSIS — Z79899 Other long term (current) drug therapy: Secondary | ICD-10-CM | POA: Diagnosis not present

## 2019-02-11 DIAGNOSIS — Z87891 Personal history of nicotine dependence: Secondary | ICD-10-CM | POA: Diagnosis not present

## 2019-02-11 DIAGNOSIS — R112 Nausea with vomiting, unspecified: Secondary | ICD-10-CM | POA: Diagnosis not present

## 2019-02-11 MED ORDER — ONDANSETRON 8 MG PO TBDP: 8.0000 mg | ORAL_TABLET | Freq: Three times a day (TID) | ORAL | 0 refills | Status: DC | PRN

## 2019-02-11 MED ORDER — PROMETHAZINE HCL 25 MG RE SUPP: 25.0000 mg | Freq: Four times a day (QID) | RECTAL | 0 refills | Status: DC | PRN

## 2019-02-11 MED ORDER — CEPHALEXIN 500 MG PO CAPS: 500.0000 mg | ORAL_CAPSULE | Freq: Three times a day (TID) | ORAL | 0 refills | Status: DC

## 2019-02-11 NOTE — Discharge Instructions (Addendum)
Please take the medication as prescribed for infection control and nausea control.  The work-up in the ER including blood work and CT scan are reassuring.  We suspect that your symptoms could be a result of methotrexate increase.  Additionally, I will review also indicates that penicillin and methotrexate can have adverse reaction, therefore we recommend that you stop taking amoxicillin.  We would also want you to call your rheumatologist to see if it is advisable to continue with methotrexate dose that was recently changed.  Finally, return to the ER if you start having severe rash to your skin, bleeding gums, difficulty in breathing, pain in your eyes, rectal bleeding, vaginal bleeding.

## 2019-02-13 DIAGNOSIS — M25572 Pain in left ankle and joints of left foot: Secondary | ICD-10-CM | POA: Diagnosis not present

## 2019-02-14 DIAGNOSIS — R87619 Unspecified abnormal cytological findings in specimens from cervix uteri: Secondary | ICD-10-CM | POA: Diagnosis not present

## 2019-02-16 LAB — CULTURE, BLOOD (ROUTINE X 2): Culture: NO GROWTH

## 2019-02-21 DIAGNOSIS — R140 Abdominal distension (gaseous): Secondary | ICD-10-CM | POA: Diagnosis not present

## 2019-02-21 DIAGNOSIS — R1013 Epigastric pain: Secondary | ICD-10-CM | POA: Diagnosis not present

## 2019-02-21 LAB — COMPREHENSIVE METABOLIC PANEL
Albumin: 4.2 (ref 3.5–5.0)
Calcium: 9.6 (ref 8.7–10.7)
GFR calc Af Amer: 136
GFR calc non Af Amer: 112

## 2019-02-21 LAB — HEPATIC FUNCTION PANEL
ALT: 25 (ref 7–35)
AST: 26 (ref 13–35)
Bilirubin, Total: 0.4

## 2019-02-21 LAB — BASIC METABOLIC PANEL
BUN: 8 (ref 4–21)
CO2: 24 — AB (ref 13–22)
Creatinine: 0.6 (ref 0.5–1.1)
Glucose: 74
Potassium: 4.3 (ref 3.4–5.3)
Sodium: 140 (ref 137–147)

## 2019-02-21 LAB — CBC AND DIFFERENTIAL
HCT: 38 (ref 36–46)
Hemoglobin: 12.8 (ref 12.0–16.0)
Platelets: 536 — AB (ref 150–399)
WBC: 8.8

## 2019-02-21 LAB — CBC: RBC: 3.72 — AB (ref 3.87–5.11)

## 2019-02-21 MED FILL — PANTOPRAZOLE SOD DR 40 MG T: 40 | 30 days supply | Qty: 60 | Fill #0

## 2019-02-22 ENCOUNTER — Encounter (HOSPITAL_BASED_OUTPATIENT_CLINIC_OR_DEPARTMENT_OTHER): Payer: Self-pay | Admitting: Orthopedic Surgery

## 2019-02-22 ENCOUNTER — Other Ambulatory Visit: Payer: Self-pay

## 2019-02-26 ENCOUNTER — Other Ambulatory Visit (HOSPITAL_COMMUNITY)
Admission: RE | Admit: 2019-02-26 | Discharge: 2019-02-26 | Disposition: A | Discharge status: Home / Self Care | Payer: 59 | Source: Ambulatory Visit | Attending: Orthopedic Surgery | Admitting: Orthopedic Surgery

## 2019-02-26 ENCOUNTER — Encounter (HOSPITAL_BASED_OUTPATIENT_CLINIC_OR_DEPARTMENT_OTHER)
Admission: RE | Admit: 2019-02-26 | Discharge: 2019-02-26 | Disposition: A | Discharge status: Home / Self Care | Payer: 59 | Source: Ambulatory Visit | Attending: Orthopedic Surgery | Admitting: Orthopedic Surgery

## 2019-02-26 ENCOUNTER — Other Ambulatory Visit: Payer: Self-pay

## 2019-02-26 DIAGNOSIS — Z01812 Encounter for preprocedural laboratory examination: Secondary | ICD-10-CM | POA: Insufficient documentation

## 2019-02-26 DIAGNOSIS — Z20822 Contact with and (suspected) exposure to covid-19: Secondary | ICD-10-CM | POA: Diagnosis not present

## 2019-02-26 LAB — POCT PREGNANCY, URINE: Preg Test, Ur: NEGATIVE

## 2019-02-26 LAB — SARS CORONAVIRUS 2 (TAT 6-24 HRS): SARS Coronavirus 2: NEGATIVE

## 2019-02-26 NOTE — Progress Notes (Signed)

## 2019-02-28 DIAGNOSIS — Z471 Aftercare following joint replacement surgery: Secondary | ICD-10-CM | POA: Diagnosis not present

## 2019-02-28 DIAGNOSIS — Z96612 Presence of left artificial shoulder joint: Secondary | ICD-10-CM | POA: Diagnosis not present

## 2019-03-01 ENCOUNTER — Ambulatory Visit (HOSPITAL_BASED_OUTPATIENT_CLINIC_OR_DEPARTMENT_OTHER)
Admission: RE | Admit: 2019-03-01 | Discharge: 2019-03-01 | Disposition: A | Discharge status: Home / Self Care | Payer: 59 | Attending: Orthopedic Surgery | Admitting: Orthopedic Surgery

## 2019-03-01 ENCOUNTER — Encounter (HOSPITAL_BASED_OUTPATIENT_CLINIC_OR_DEPARTMENT_OTHER): Payer: Self-pay | Admitting: Orthopedic Surgery

## 2019-03-01 ENCOUNTER — Other Ambulatory Visit: Payer: Self-pay

## 2019-03-01 ENCOUNTER — Ambulatory Visit (HOSPITAL_BASED_OUTPATIENT_CLINIC_OR_DEPARTMENT_OTHER): Payer: 59 | Admitting: Certified Registered Nurse Anesthetist

## 2019-03-01 ENCOUNTER — Encounter (HOSPITAL_BASED_OUTPATIENT_CLINIC_OR_DEPARTMENT_OTHER)
Admission: RE | Disposition: A | Discharge status: Home / Self Care | Payer: Self-pay | Source: Home / Self Care | Attending: Orthopedic Surgery

## 2019-03-01 DIAGNOSIS — Z882 Allergy status to sulfonamides status: Secondary | ICD-10-CM | POA: Insufficient documentation

## 2019-03-01 DIAGNOSIS — M4802 Spinal stenosis, cervical region: Secondary | ICD-10-CM | POA: Diagnosis not present

## 2019-03-01 DIAGNOSIS — M810 Age-related osteoporosis without current pathological fracture: Secondary | ICD-10-CM | POA: Diagnosis not present

## 2019-03-01 DIAGNOSIS — M2042 Other hammer toe(s) (acquired), left foot: Secondary | ICD-10-CM | POA: Diagnosis not present

## 2019-03-01 DIAGNOSIS — Z7952 Long term (current) use of systemic steroids: Secondary | ICD-10-CM | POA: Diagnosis not present

## 2019-03-01 DIAGNOSIS — Z888 Allergy status to other drugs, medicaments and biological substances status: Secondary | ICD-10-CM | POA: Diagnosis not present

## 2019-03-01 DIAGNOSIS — E273 Drug-induced adrenocortical insufficiency: Secondary | ICD-10-CM | POA: Diagnosis not present

## 2019-03-01 DIAGNOSIS — Z87891 Personal history of nicotine dependence: Secondary | ICD-10-CM | POA: Insufficient documentation

## 2019-03-01 DIAGNOSIS — Z79899 Other long term (current) drug therapy: Secondary | ICD-10-CM | POA: Insufficient documentation

## 2019-03-01 DIAGNOSIS — M25775 Osteophyte, left foot: Secondary | ICD-10-CM | POA: Diagnosis not present

## 2019-03-01 DIAGNOSIS — M069 Rheumatoid arthritis, unspecified: Secondary | ICD-10-CM | POA: Diagnosis not present

## 2019-03-01 DIAGNOSIS — M19072 Primary osteoarthritis, left ankle and foot: Secondary | ICD-10-CM | POA: Insufficient documentation

## 2019-03-01 DIAGNOSIS — M960 Pseudarthrosis after fusion or arthrodesis: Secondary | ICD-10-CM | POA: Diagnosis not present

## 2019-03-01 DIAGNOSIS — M79672 Pain in left foot: Secondary | ICD-10-CM | POA: Diagnosis not present

## 2019-03-01 DIAGNOSIS — Z7983 Long term (current) use of bisphosphonates: Secondary | ICD-10-CM | POA: Diagnosis not present

## 2019-03-01 DIAGNOSIS — G8918 Other acute postprocedural pain: Secondary | ICD-10-CM | POA: Diagnosis not present

## 2019-03-01 HISTORY — PX: FOOT ARTHRODESIS: SHX1655

## 2019-03-01 SURGERY — FUSION, JOINT, FOOT
Anesthesia: General | Site: Foot | Laterality: Left

## 2019-03-01 MED ORDER — LACTATED RINGERS IV SOLN: INTRAVENOUS | Status: DC

## 2019-03-01 MED ORDER — LIDOCAINE 2% (20 MG/ML) 5 ML SYRINGE
INTRAMUSCULAR | Status: DC | PRN
  Administered 2019-03-01: 30 mg via INTRAVENOUS

## 2019-03-01 MED ORDER — CEFAZOLIN SODIUM-DEXTROSE 2-4 GM/100ML-% IV SOLN
INTRAVENOUS | Status: AC
  Filled 2019-03-01: qty 100

## 2019-03-01 MED ORDER — OXYCODONE HCL 5 MG PO TABS: 5.0000 mg | ORAL_TABLET | ORAL | 0 refills | Status: AC | PRN

## 2019-03-01 MED ORDER — MIDAZOLAM HCL 2 MG/2ML IJ SOLN
INTRAMUSCULAR | Status: AC
  Filled 2019-03-01: qty 2

## 2019-03-01 MED ORDER — ROPIVACAINE HCL 5 MG/ML IJ SOLN
INTRAMUSCULAR | Status: DC | PRN
  Administered 2019-03-01: 10 mL via PERINEURAL
  Administered 2019-03-01: 20 mL via PERINEURAL

## 2019-03-01 MED ORDER — OXYCODONE HCL 5 MG/5ML PO SOLN: 5.0000 mg | Freq: Once | ORAL | Status: DC | PRN

## 2019-03-01 MED ORDER — ONDANSETRON HCL 4 MG/2ML IJ SOLN
INTRAMUSCULAR | Status: AC
  Filled 2019-03-01: qty 2

## 2019-03-01 MED ORDER — ASPIRIN EC 81 MG PO TBEC: 81.0000 mg | DELAYED_RELEASE_TABLET | Freq: Two times a day (BID) | ORAL | 0 refills | Status: DC

## 2019-03-01 MED ORDER — DEXAMETHASONE SODIUM PHOSPHATE 10 MG/ML IJ SOLN
INTRAMUSCULAR | Status: DC | PRN
  Administered 2019-03-01: 4 mg via INTRAVENOUS

## 2019-03-01 MED ORDER — DEXAMETHASONE SODIUM PHOSPHATE 10 MG/ML IJ SOLN
INTRAMUSCULAR | Status: DC | PRN
  Administered 2019-03-01: 5 mg

## 2019-03-01 MED ORDER — SODIUM CHLORIDE 0.9 % IV SOLN: INTRAVENOUS | Status: DC

## 2019-03-01 MED ORDER — DEXAMETHASONE SODIUM PHOSPHATE 10 MG/ML IJ SOLN
INTRAMUSCULAR | Status: AC
  Filled 2019-03-01: qty 1

## 2019-03-01 MED ORDER — CEFAZOLIN SODIUM-DEXTROSE 2-4 GM/100ML-% IV SOLN
2.0000 g | INTRAVENOUS | Status: AC
  Administered 2019-03-01: 2 g via INTRAVENOUS

## 2019-03-01 MED ORDER — VANCOMYCIN HCL 500 MG IV SOLR
INTRAVENOUS | Status: DC | PRN
  Administered 2019-03-01: 500 mg via TOPICAL

## 2019-03-01 MED ORDER — FENTANYL CITRATE (PF) 100 MCG/2ML IJ SOLN
INTRAMUSCULAR | Status: AC
  Filled 2019-03-01: qty 2

## 2019-03-01 MED ORDER — SENNA 8.6 MG PO TABS: 2.0000 | ORAL_TABLET | Freq: Two times a day (BID) | ORAL | 0 refills | Status: DC

## 2019-03-01 MED ORDER — ONDANSETRON HCL 4 MG/2ML IJ SOLN
INTRAMUSCULAR | Status: DC | PRN
  Administered 2019-03-01: 4 mg via INTRAVENOUS

## 2019-03-01 MED ORDER — FENTANYL CITRATE (PF) 100 MCG/2ML IJ SOLN
50.0000 ug | INTRAMUSCULAR | Status: DC | PRN
  Administered 2019-03-01: 50 ug via INTRAVENOUS

## 2019-03-01 MED ORDER — MIDAZOLAM HCL 2 MG/2ML IJ SOLN
1.0000 mg | INTRAMUSCULAR | Status: DC | PRN
  Administered 2019-03-01: 2 mg via INTRAVENOUS

## 2019-03-01 MED ORDER — ONDANSETRON HCL 4 MG/2ML IJ SOLN: 4.0000 mg | Freq: Once | INTRAMUSCULAR | Status: DC | PRN

## 2019-03-01 MED ORDER — 0.9 % SODIUM CHLORIDE (POUR BTL) OPTIME
TOPICAL | Status: DC | PRN
  Administered 2019-03-01: 200 mL

## 2019-03-01 MED ORDER — LIDOCAINE 2% (20 MG/ML) 5 ML SYRINGE
INTRAMUSCULAR | Status: AC
  Filled 2019-03-01: qty 5

## 2019-03-01 MED ORDER — OXYCODONE HCL 5 MG PO TABS: 5.0000 mg | ORAL_TABLET | Freq: Once | ORAL | Status: DC | PRN

## 2019-03-01 MED ORDER — PROPOFOL 10 MG/ML IV BOLUS
INTRAVENOUS | Status: DC | PRN
  Administered 2019-03-01: 100 mg via INTRAVENOUS

## 2019-03-01 MED ORDER — DOCUSATE SODIUM 100 MG PO CAPS: 100.0000 mg | ORAL_CAPSULE | Freq: Every day | ORAL | 2 refills | Status: DC | PRN

## 2019-03-01 MED ORDER — ESMOLOL HCL 100 MG/10ML IV SOLN
INTRAVENOUS | Status: AC
  Filled 2019-03-01: qty 10

## 2019-03-01 MED ORDER — FENTANYL CITRATE (PF) 100 MCG/2ML IJ SOLN
INTRAMUSCULAR | Status: DC | PRN
  Administered 2019-03-01 (×4): 25 ug via INTRAVENOUS

## 2019-03-01 MED ORDER — CHLORHEXIDINE GLUCONATE 4 % EX LIQD: 60.0000 mL | Freq: Once | CUTANEOUS | Status: DC

## 2019-03-01 MED ORDER — FENTANYL CITRATE (PF) 100 MCG/2ML IJ SOLN
25.0000 ug | INTRAMUSCULAR | Status: DC | PRN
  Administered 2019-03-01: 25 ug via INTRAVENOUS

## 2019-03-01 MED FILL — predniSONE 5 MG TABS: 5 | 90 days supply | Qty: 90 | Fill #0

## 2019-03-01 MED FILL — oxyCODONE HCL 5 MG TABS: 5 | 5 days supply | Qty: 30 | Fill #0

## 2019-03-01 SURGICAL SUPPLY — 80 items
BANDAGE ESMARK 6X9 LF (GAUZE/BANDAGES/DRESSINGS) IMPLANT
BIT DRILL 7/64X5 DISP (BIT) ×2 IMPLANT
BIT DRILL CAL 2.5 ST W/SLV (BIT) ×2 IMPLANT
BIT TREPHINE CORING 8 (BIT) IMPLANT
BLADE AVERAGE 25X9 (BLADE) IMPLANT
BLADE MICRO SAGITTAL (BLADE) IMPLANT
BLADE SURG 15 STRL LF DISP TIS (BLADE) ×3 IMPLANT
BLADE SURG 15 STRL SS (BLADE) ×3
BNDG COHESIVE 4X5 TAN STRL (GAUZE/BANDAGES/DRESSINGS) ×2 IMPLANT
BNDG COHESIVE 6X5 TAN STRL LF (GAUZE/BANDAGES/DRESSINGS) ×2 IMPLANT
BNDG ESMARK 6X9 LF (GAUZE/BANDAGES/DRESSINGS)
CHLORAPREP W/TINT 26 (MISCELLANEOUS) ×2 IMPLANT
COVER BACK TABLE 60X90IN (DRAPES) ×2 IMPLANT
COVER MAYO STAND STRL (DRAPES) ×2 IMPLANT
COVER WAND RF STERILE (DRAPES) IMPLANT
CUFF TOURN SGL QUICK 24 (TOURNIQUET CUFF) ×1
CUFF TOURN SGL QUICK 34 (TOURNIQUET CUFF)
CUFF TRNQT CYL 24X4X16.5-23 (TOURNIQUET CUFF) ×1 IMPLANT
CUFF TRNQT CYL 34X4.125X (TOURNIQUET CUFF) IMPLANT
DRAPE EXTREMITY T 121X128X90 (DISPOSABLE) ×2 IMPLANT
DRAPE OEC MINIVIEW 54X84 (DRAPES) ×2 IMPLANT
DRAPE U-SHAPE 47X51 STRL (DRAPES) ×2 IMPLANT
DRSG MEPITEL 4X7.2 (GAUZE/BANDAGES/DRESSINGS) ×2 IMPLANT
DRSG PAD ABDOMINAL 8X10 ST (GAUZE/BANDAGES/DRESSINGS) ×4 IMPLANT
ELECT REM PT RETURN 9FT ADLT (ELECTROSURGICAL) ×2
ELECTRODE REM PT RTRN 9FT ADLT (ELECTROSURGICAL) ×1 IMPLANT
GAUZE SPONGE 4X4 12PLY STRL (GAUZE/BANDAGES/DRESSINGS) ×2 IMPLANT
GLOVE BIO SURGEON STRL SZ8 (GLOVE) ×2 IMPLANT
GLOVE BIOGEL PI IND STRL 6.5 (GLOVE) ×1 IMPLANT
GLOVE BIOGEL PI IND STRL 7.5 (GLOVE) ×2 IMPLANT
GLOVE BIOGEL PI IND STRL 8 (GLOVE) ×2 IMPLANT
GLOVE BIOGEL PI INDICATOR 6.5 (GLOVE) ×1
GLOVE BIOGEL PI INDICATOR 7.5 (GLOVE) ×2
GLOVE BIOGEL PI INDICATOR 8 (GLOVE) ×2
GLOVE ECLIPSE 6.5 STRL STRAW (GLOVE) ×2 IMPLANT
GLOVE ECLIPSE 8.0 STRL XLNG CF (GLOVE) ×2 IMPLANT
GOWN STRL REUS W/ TWL LRG LVL3 (GOWN DISPOSABLE) ×1 IMPLANT
GOWN STRL REUS W/ TWL XL LVL3 (GOWN DISPOSABLE) ×3 IMPLANT
GOWN STRL REUS W/TWL LRG LVL3 (GOWN DISPOSABLE) ×1
GOWN STRL REUS W/TWL XL LVL3 (GOWN DISPOSABLE) ×3
K-WIRE TROC 1.25X150 (WIRE) ×2
KIT ARCUS SIZING TEMPLATE STRL (KITS) IMPLANT
KIT STRATUM INSTRUMENT STD (KITS) ×2 IMPLANT
KWIRE TROC 1.25X150 (WIRE) ×1 IMPLANT
NDL SAFETY ECLIPSE 18X1.5 (NEEDLE) IMPLANT
NEEDLE HYPO 18GX1.5 SHARP (NEEDLE)
NEEDLE HYPO 22GX1.5 SAFETY (NEEDLE) IMPLANT
PACK BASIN DAY SURGERY FS (CUSTOM PROCEDURE TRAY) ×2 IMPLANT
PAD CAST 4YDX4 CTTN HI CHSV (CAST SUPPLIES) ×1 IMPLANT
PADDING CAST COTTON 4X4 STRL (CAST SUPPLIES) ×1
PADDING CAST COTTON 6X4 STRL (CAST SUPPLIES) ×2 IMPLANT
PENCIL SMOKE EVACUATOR (MISCELLANEOUS) ×2 IMPLANT
PLATE STRT STRM 4H (Plate) ×2 IMPLANT
SANITIZER HAND PURELL 535ML FO (MISCELLANEOUS) ×2 IMPLANT
SCREW  STRATUM NL LP ST 3.5X18 (Screw) ×1 IMPLANT
SCREW CANN 4.0X38MM (Screw) ×2 IMPLANT
SCREW CANN FT 4.0X48 (Screw) ×2 IMPLANT
SCREW CANNULATED PT 4.0X40 (Screw) ×2 IMPLANT
SCREW STRATUM NL LP ST 3.5X18 (Screw) ×1 IMPLANT
SCREW STRATUM NL LP ST 3.5X24 (Screw) ×2 IMPLANT
SCREW STRM LOCK 3.5X22 (Screw) ×4 IMPLANT
SHEET MEDIUM DRAPE 40X70 STRL (DRAPES) ×2 IMPLANT
SLEEVE SCD COMPRESS KNEE MED (MISCELLANEOUS) ×2 IMPLANT
SPLINT FAST PLASTER 5X30 (CAST SUPPLIES) ×20
SPLINT PLASTER CAST FAST 5X30 (CAST SUPPLIES) ×20 IMPLANT
SPONGE LAP 18X18 RF (DISPOSABLE) ×2 IMPLANT
SPONGE SURGIFOAM ABS GEL 12-7 (HEMOSTASIS) IMPLANT
STOCKINETTE 6  STRL (DRAPES) ×1
STOCKINETTE 6 STRL (DRAPES) ×1 IMPLANT
SUCTION FRAZIER HANDLE 10FR (MISCELLANEOUS) ×1
SUCTION TUBE FRAZIER 10FR DISP (MISCELLANEOUS) ×1 IMPLANT
SUT ETHILON 3 0 PS 1 (SUTURE) ×2 IMPLANT
SUT MNCRL AB 3-0 PS2 18 (SUTURE) ×2 IMPLANT
SUT VIC AB 0 SH 27 (SUTURE) IMPLANT
SUT VIC AB 2-0 SH 27 (SUTURE) ×1
SUT VIC AB 2-0 SH 27XBRD (SUTURE) ×1 IMPLANT
SYR BULB 3OZ (MISCELLANEOUS) ×2 IMPLANT
TOWEL GREEN STERILE FF (TOWEL DISPOSABLE) ×4 IMPLANT
TUBE CONNECTING 20X1/4 (TUBING) ×2 IMPLANT
UNDERPAD 30X36 HEAVY ABSORB (UNDERPADS AND DIAPERS) ×2 IMPLANT

## 2019-03-01 NOTE — H&P (Signed)
Anne Garrett is an 44 y.o. female.   Chief Complaint: left foot pain HPI: The patient is a 44 year old female with a past medical history significant for rheumatoid arthritis.  She has a long history of left foot pain due to degenerative changes at the talonavicular and naviculocuneiform joints.  She has failed nonoperative treatment to date including activity modification, oral anti-inflammatories, orthotics and shoewear modification.  She presents now for surgical treatment.  She has been off of her rheumatologic medications for the last 3 weeks.  Past Medical History:  Diagnosis Date  . Adrenal insufficiency ([place])    secondary to Prednisone  . Eczema    coner oif eye lid  . Headache    Migraine  . Heart murmur 2012   -? heard at PCP visit 10-15 years ago-had ECHO -benign  . Intervertebral disk disease   . Osteoporosis   . Rheumatoid arthritis ([place])    Juvenile onset    Past Surgical History:  Procedure Laterality Date  . ANKLE ARTHROPLASTY Right   . ANTERIOR CERVICAL DECOMP/DISCECTOMY FUSION N/A 06/19/2015   Procedure: C3-4 C4-5 Anterior cervical decompression/diskectomy/fusion;  Surgeon: Jovita Gamma, MD;  Location: [facility];  Service: Neurosurgery;  Laterality: N/A;  C3-4 C4-5 Anterior cervical decompression/diskectomy/fusion  . APPENDECTOMY    . CARPAL TUNNEL RELEASE Right   . CESAREAN SECTION    . CHOLECYSTECTOMY    . COLONOSCOPY    . ESSURE TUBAL LIGATION     Essure Implant-Permanent birth control  . excision to palm Left    excision of mass to right plam  . HARDWARE REMOVAL Right 02/02/2017   Procedure: RIGHT WRIST DEEP IMPLANT REMOVAL, RIGHT WRIST AND HAND EXTENSOR TENDON RECONSTRUCTION, TENDON TRANSFER AND RECONSTRUCTION AS INDICATED;  Surgeon: Iran Planas, MD;  Location: [facility];  Service: Orthopedics;  Laterality: Right;  . JOINT REPLACEMENT Bilateral    bilat hip replacements  . ORIF FEMUR FRACTURE Right   . OTHER SURGICAL HISTORY  06/04/2016   Revision of PLIF  . plif l5-s1 arthrodeisis    2018  . POSTERIOR CERVICAL FUSION/FORAMINOTOMY N/A 07/12/2015   Procedure: C2 to C5 Cervical laminectomy, C2 to C5 posterior cervical arthrodesis with instrumentation and bone graft;  Surgeon: Jovita Gamma, MD;  Location: [facility];  Service: Neurosurgery;  Laterality: N/A;  C2 to C5 Cervical laminectomy, C2  to C5 posterior cervical arthrodesis with instrumentation and bone graft  . REPAIR EXTENSOR TENDON Right 02/02/2017   Procedure: REPAIR EXTENSOR TENDON;  Surgeon: Iran Planas, MD;  Location: [facility];  Service: Orthopedics;  Laterality: Right;  . REVERSE SHOULDER ARTHROPLASTY Left 09/07/2018   Procedure: REVERSE SHOULDER ARTHROPLASTY;  Surgeon: [name], MD;  Location: [facility];  Service: Orthopedics;  Laterality: Left;  . right foot surgery Right    right jfoot arthrodesis and right ankle arthroplasty  . WRIST SURGERY Bilateral    arthrodesis    Family History  Problem Relation Age of Onset  . Breast cancer Mother 58  . Nephritis Father   . Epilepsy Father   . Breast cancer Maternal Aunt        2 mat aunts   . Multiple sclerosis Maternal Uncle   . Psoriasis Paternal Aunt    Social History:  reports that she quit smoking about 3 years ago. Her smoking use included cigarettes. She has a 5.00 pack-year smoking history. She has never used smokeless tobacco. She reports current alcohol use of about 2.0 standard drinks of alcohol per week. She reports that she  does not use drugs.  Allergies:  Allergies  Allergen Reactions  . Sulfa Antibiotics Itching and Rash    Head to toe  . Teriparatide Other (See Comments)    Severe Muscle Pain, Forteo  . Valproic Acid And Related Rash    Head to toe  . Celexa [Citalopram] Diarrhea       . Leflunomide Diarrhea  . Paroxetine Hcl Diarrhea  . Zoloft [Sertraline] Diarrhea    Medications Prior to Admission  Medication Sig Dispense Refill  . baricitinib (OLUMIANT) 2 MG TABS tablet Take 1  tablet (2 mg total) by mouth daily. (Patient taking differently: Take 2 mg by mouth daily. ) 30 tablet 2  . Calcium Carb-Cholecalciferol (CALCIUM+D3 PO) Take 1 tablet by mouth daily.    . Cholecalciferol (VITAMIN D3) 50 MCG (2000 UT) TABS Take 2,000 Units by mouth daily.    Marland Kitchen ibandronate (BONIVA) 150 MG tablet Take 150 mg by mouth every 30 (thirty) days. Take in the morning with a full glass of water, on an empty stomach, and do not take anything else by mouth or lie down for the next 30 min.    Marland Kitchen ibuprofen (ADVIL,MOTRIN) 200 MG tablet Take 600-800 mg by mouth 2 (two) times daily as needed for headache, mild pain or moderate pain.     Marland Kitchen leucovorin (WELLCOVORIN) 5 MG tablet Take 5 mg by mouth daily.    . methotrexate 250 MG/10ML injection Inject 10 mg into the skin every Saturday.    . Multiple Vitamin (MULTIVITAMIN WITH MINERALS) TABS tablet Take 1 tablet by mouth daily.    . ondansetron (ZOFRAN) 8 MG tablet Take 8 mg by mouth every 8 (eight) hours as needed for nausea or vomiting.    . pantoprazole (PROTONIX) 40 MG tablet Take 40 mg by mouth 2 (two) times daily.    . predniSONE (DELTASONE) 5 MG tablet Take 5 mg by mouth every evening.     . Butalbital-APAP-Caffeine 50-325-40 MG capsule Take 1 capsule by mouth daily as needed for migraine.    . traMADol (ULTRAM) 50 MG tablet Take 50 mg by mouth 2 (two) times daily.       No results found for this or any previous visit (from the past 48 hour(s)). No results found.  Review of Systems no recent fever, chills, nausea, vomiting or changes in her appetite  Blood pressure 104/75, pulse 72, temperature 99.5 F (37.5 C), temperature source Tympanic, resp. rate 10, height 4\' 11"  (1.499 m), weight 51.2 kg, last menstrual period 12/15/2018, SpO2 100 %. Physical Exam  Well-nourished well-developed female bearing significant stigmata of longstanding rheumatoid arthritis.  Left foot has essentially no range of motion in inversion and eversion or in  dorsiflexion and plantarflexion of the ankle.  Skin is somewhat thin.  Pulses are palpable in the foot.  Hammertoe deformities are noted.  No lymphadenopathy.  Sensibility to light touch is intact dorsally and plantarly at the forefoot.  Assessment/Plan Left talonavicular and naviculocuneiform arthritis -to the operating room today for talonavicular and naviculocuneiform arthrodesis.  The risks and benefits of the alternative treatment options have been discussed in detail.  The patient wishes to proceed with surgery and specifically understands risks of bleeding, infection, nerve damage, blood clots, need for additional surgery, amputation and death.   [name], MD 26-Mar-2019, 9:51 AM

## 2019-03-01 NOTE — Transfer of Care (Signed)
Immediate Anesthesia Transfer of Care Note  Patient: Anne Garrett  Procedure(s) Performed: Left talonavicular and naviculocuneiform arthrodesis (Left Foot)  Patient Location: PACU  Anesthesia Type:GA combined with regional for post-op pain  Level of Consciousness: awake, alert  and oriented  Airway & Oxygen Therapy: Patient Spontanous Breathing and Patient connected to face mask oxygen  Post-op Assessment: Report given to RN and Post -op Vital signs reviewed and stable  Post vital signs: Reviewed and stable  Last Vitals:  Vitals Value Taken Time  BP 120/73 03/01/19 1202  Temp    Pulse 100 03/01/19 1209  Resp 16 03/01/19 1209  SpO2 99 % 03/01/19 1209  Vitals shown include unvalidated device data.  Last Pain:  Vitals:   03/01/19 0827  TempSrc: Tympanic  PainSc: 0-No pain         Complications: No apparent anesthesia complications

## 2019-03-01 NOTE — Op Note (Signed)
03/01/2019  12:01 PM  PATIENT:  Anne Garrett  44 y.o. female  PRE-OPERATIVE DIAGNOSIS: 1.  Left talonavicular joint arthritis     2.  Left naviculocuneiform joint arthritis  POST-OPERATIVE DIAGNOSIS: Same  Procedure(s): 1.  Arthrodesis of the left talonavicular joint 2.  Arthrodesis of the left medial, middle and lateral naviculocuneiform joints 3.  AP, lateral and oblique radiographs of the left foot  SURGEON:  [name], MD  ASSISTANT: Mechele Claude, PA-C  ANESTHESIA:   General, regional  EBL:  minimal   TOURNIQUET:   Total Tourniquet Time Documented: Thigh (Left) - 82 minutes Total: Thigh (Left) - 82 minutes  COMPLICATIONS:  None apparent  DISPOSITION:  Extubated, awake and stable to recovery.  INDICATION FOR PROCEDURE: The patient is a 44 year old female with a past medical history significant for rheumatoid arthritis.  She has developed severe arthritis of the left midfoot.  She has failed nonoperative treatment to date including activity modification, oral anti-inflammatories, shoewear modification and orthotics.  She presents now for surgical treatment of this painful and limiting condition.  The risks and benefits of the alternative treatment options have been discussed in detail.  The patient wishes to proceed with surgery and specifically understands risks of bleeding, infection, nerve damage, blood clots, need for additional surgery, amputation and death.  PROCEDURE IN DETAIL:  After pre operative consent was obtained, and the correct operative site was identified, the patient was brought to the operating room and placed supine on the OR table.  Anesthesia was administered.  Pre-operative antibiotics were administered.  A surgical timeout was taken.  The left lower extremity was prepped and draped in standard sterile fashion with a tourniquet around the thigh.  The extremity was elevated and the tourniquet was inflated to 250 mmHg.  A longitudinal incision was then  made over the dorsal midfoot.  Dissection was carried down through the subcutaneous tissues.  The extensor houses brevis tendon was identified.  The neurovascular bundle was identified.  Both were mobilized and retracted and protected throughout the case.  The talonavicular joint was identified.  Dorsal osteophytes were removed with a rondure.  The joint was opened.  Remaining articular cartilage was removed with an osteotome and curette.  The wound was irrigated copiously.  A small drill bit was used to perforate both sides of the joint leaving the resultant bone graft in place.  Attention was then turned to the naviculocuneiform joints.  Again dorsal osteophytes were removed with a rondure.  The joints were opened and cleaned of all remaining articular cartilage.  The wound was irrigated copiously.  A small drill bit was used to perforate both sides of the 3 joints leaving the resultant bone graft in place.  The talonavicular and naviculocuneiform joints were then reduced.  A K wire was inserted from the lateral cuneiform across the naviculocuneiform and talonavicular joints into the head of the talus medially.  AP and lateral radiographs confirmed appropriate position of the guidepin.  A 4 mm cannulated partially threaded screw from the [employer] set was selected.  It was then inserted over the guidewire and tightened appropriately compressing both rows of joints.  A guidepin was then inserted from the medial cuneiform across both joints to the lateral head of the talus.  Radiographs again confirmed appropriate alignment.  A 4 mm partially-threaded screw was inserted again compressing both rows of joints appropriately.  A 4 hole straight stratum plate template was then placed from the talus across both joints to the  middle cuneiform.  The proximal holes were drilled and the template was removed.  The plate was then inserted and impacted into the proximal 2 holes after bending appropriately.  Distally the  threaded guide pin was inserted into the slotted hole ramp.  The proximal hole in the plate was drilled and a nonlocking screw inserted.  The ramp was then used to compress the talonavicular and naviculocuneiform joints appropriately.  The central 2 holes were drilled and filled with locking screws in the navicular.  The distal hole in the middle cuneiform was then used to insert a final nonlocking screw.  Final AP, lateral and oblique radiographs showed appropriate position and length of all hardware and appropriate reduction and compression of the naviculocuneiform and talonavicular joints.  Wounds were irrigated copiously.  Vancomycin powder was placed over the plate.  Skin incision was closed with horizontal mattress sutures of 3-0 nylon.  Sterile dressings were applied followed by a well-padded short leg splint.  The tourniquet was released after application of the dressings.  The patient was awakened from anesthesia and transported to the recovery room in stable condition.   FOLLOW UP PLAN: The patient will resume methotrexate and prednisone on postop day 1.  Follow-up with me in the office in 2 weeks for wound check and possible suture removal.  Nonweightbearing immobilization for 6 weeks postop.  She may resume biologic rheumatologic medications 3 weeks postop.   RADIOGRAPHS: AP, lateral and oblique radiographs of the left foot are obtained intraoperatively.  These show interval arthrodesis of the talonavicular and naviculocuneiform joints.  Hardware is appropriately positioned and of the appropriate lengths.  No other acute injuries are noted.  Incidental note is made of severe tibiotalar joint arthritis with flattening of the talar dome.    Mechele Claude PA-C was present and scrubbed for the duration of the operative case. His assistance was essential in positioning the patient, prepping and draping, gaining and maintaining exposure, performing the operation, closing and dressing the wounds and  applying the splint.

## 2019-03-01 NOTE — Progress Notes (Signed)
Assisted Dr. Witman with left, ultrasound guided, popliteal, adductor canal block. Side rails up, monitors on throughout procedure. See vital signs in flow sheet. Tolerated Procedure well. 

## 2019-03-01 NOTE — Discharge Instructions (Addendum)
[name], MD [facility]  Please read the following information regarding your care after surgery.  Medications  You only need a prescription for the narcotic pain medicine (ex. oxycodone, Percocet, Norco).  All of the other medicines listed below are available over the counter. X Aleve 2 pills twice a day for the first 3 days after surgery. X acetominophen (Tylenol) 650 mg every 4-6 hours as you need for minor to moderate pain X oxycodone as prescribed for severe pain  Narcotic pain medicine (ex. oxycodone, Percocet, Vicodin) will cause constipation.  To prevent this problem, take the following medicines while you are taking any pain medicine. X docusate sodium (Colace) 100 mg twice a day X senna (Senokot) 2 tablets twice a day  X To help prevent blood clots, take a baby aspirin (81 mg) twice a day after surgery.  You should also get up every hour while you are awake to move around.    Weight Bearing X Do not bear any weight on the operated leg or foot.  Cast / Splint / Dressing X Keep your splint, cast or dressing clean and dry.  Don't put anything (coat hanger, pencil, etc) down inside of it.  If it gets damp, use a hair dryer on the cool setting to dry it.  If it gets soaked, call the office to schedule an appointment for a cast change.   After your dressing, cast or splint is removed; you may shower, but do not soak or scrub the wound.  Allow the water to run over it, and then gently pat it dry.  Swelling It is normal for you to have swelling where you had surgery.  To reduce swelling and pain, keep your toes above your nose for at least 3 days after surgery.  It may be necessary to keep your foot or leg elevated for several weeks.  If it hurts, it should be elevated.  Follow Up Call my office at [phone] when you are discharged from the hospital or surgery center to schedule an appointment to be seen two weeks after surgery.  Call my office at [phone] if you develop a  fever >101.5 F, nausea, vomiting, bleeding from the surgical site or severe pain.     Call your surgeon if you experience:   1.  Fever over 101.0. 2.  Inability to urinate. 3.  Nausea and/or vomiting. 4.  Extreme swelling or bruising at the surgical site. 5.  Continued bleeding from the incision. 6.  Increased pain, redness or drainage from the incision. 7.  Problems related to your pain medication. 8.  Any problems and/or concerns    Post Anesthesia Home Care Instructions  Activity: Get plenty of rest for the remainder of the day. A responsible individual must stay with you for 24 hours following the procedure.  For the next 24 hours, DO NOT: -Drive a car -Paediatric nurse -Drink alcoholic beverages -Take any medication unless instructed by your physician -Make any legal decisions or sign important papers.  Meals: Start with liquid foods such as gelatin or soup. Progress to regular foods as tolerated. Avoid greasy, spicy, heavy foods. If nausea and/or vomiting occur, drink only clear liquids until the nausea and/or vomiting subsides. Call your physician if vomiting continues.  Special Instructions/Symptoms: Your throat may feel dry or sore from the anesthesia or the breathing tube placed in your throat during surgery. If this causes discomfort, gargle with warm salt water. The discomfort should disappear within 24 hours.  If you had a  scopolamine patch placed behind your ear for the management of post- operative nausea and/or vomiting:  1. The medication in the patch is effective for 72 hours, after which it should be removed.  Wrap patch in a tissue and discard in the trash. Wash hands thoroughly with soap and water. 2. You may remove the patch earlier than 72 hours if you experience unpleasant side effects which may include dry mouth, dizziness or visual disturbances. 3. Avoid touching the patch. Wash your hands with soap and water after contact with the patch.      Regional Anesthesia Blocks  1. Numbness or the inability to move the "blocked" extremity may last from 3-48 hours after placement. The length of time depends on the medication injected and your individual response to the medication. If the numbness is not going away after 48 hours, call your surgeon.  2. The extremity that is blocked will need to be protected until the numbness is gone and the  Strength has returned. Because you cannot feel it, you will need to take extra care to avoid injury. Because it may be weak, you may have difficulty moving it or using it. You may not know what position it is in without looking at it while the block is in effect.  3. For blocks in the legs and feet, returning to weight bearing and walking needs to be done carefully. You will need to wait until the numbness is entirely gone and the strength has returned. You should be able to move your leg and foot normally before you try and bear weight or walk. You will need someone to be with you when you first try to ensure you do not fall and possibly risk injury.  4. Bruising and tenderness at the needle site are common side effects and will resolve in a few days.  5. Persistent numbness or new problems with movement should be communicated to the surgeon or the [facility] [phone] [place] [phone]).

## 2019-03-01 NOTE — Anesthesia Procedure Notes (Signed)
Anesthesia Regional Block: Adductor canal block   Pre-Anesthetic Checklist: ,, timeout performed, Correct Patient, Correct Site, Correct Laterality, Correct Procedure, Correct Position, site marked, Risks and benefits discussed,  Surgical consent,  Pre-op evaluation,  At surgeon's request and post-op pain management  Laterality: Left  Prep: chloraprep       Needles:  Injection technique: Single-shot  Needle Type: Echogenic Stimulator Needle     Needle Length: 10cm  Needle Gauge: 21     Additional Needles:   Procedures:,,,, ultrasound used (permanent image in chart),,,,  Narrative:  Start time: 03/01/2019 8:55 AM End time: 03/01/2019 8:58 AM Injection made incrementally with aspirations every 5 mL.  Performed by: Personally  Anesthesiologist: Lidia Collum, MD  Additional Notes: Monitors applied. Injection made in 5cc increments. No resistance to injection. Good needle visualization. Patient tolerated procedure well.

## 2019-03-01 NOTE — Anesthesia Procedure Notes (Signed)
Procedure Name: LMA Insertion Date/Time: 03/01/2019 10:18 AM Performed by: Genelle Bal, CRNA Pre-anesthesia Checklist: Patient identified, Emergency Drugs available, Suction available and Patient being monitored Patient Re-evaluated:Patient Re-evaluated prior to induction Oxygen Delivery Method: Circle system utilized Preoxygenation: Pre-oxygenation with 100% oxygen Induction Type: IV induction Ventilation: Mask ventilation without difficulty LMA: LMA inserted LMA Size: 4.0 Number of attempts: 1 Airway Equipment and Method: Bite block Placement Confirmation: positive ETCO2 Tube secured with: Tape Dental Injury: Teeth and Oropharynx as per pre-operative assessment

## 2019-03-01 NOTE — Anesthesia Procedure Notes (Signed)
Anesthesia Regional Block: Popliteal block   Pre-Anesthetic Checklist: ,, timeout performed, Correct Patient, Correct Site, Correct Laterality, Correct Procedure, Correct Position, site marked, Risks and benefits discussed,  Surgical consent,  Pre-op evaluation,  At surgeon's request and post-op pain management  Laterality: Left  Prep: chloraprep       Needles:  Injection technique: Single-shot  Needle Type: Echogenic Stimulator Needle     Needle Length: 10cm  Needle Gauge: 21     Additional Needles:   Procedures:,,,, ultrasound used (permanent image in chart),,,,  Narrative:  Start time: 03/01/2019 8:58 AM End time: 03/01/2019 9:01 AM Injection made incrementally with aspirations every 5 mL.  Performed by: Personally  Anesthesiologist: Lidia Collum, MD  Additional Notes: Monitors applied. Injection made in 5cc increments. No resistance to injection. Good needle visualization. Patient tolerated procedure well.

## 2019-03-01 NOTE — Anesthesia Preprocedure Evaluation (Addendum)
Anesthesia Evaluation  Patient identified by MRN, date of birth, ID band Patient awake    Reviewed: Allergy & Precautions, NPO status , Patient's Chart, lab work & pertinent test results  History of Anesthesia Complications Negative for: history of anesthetic complications  Airway Mallampati: II  TM Distance: >3 FB Neck ROM: Full    Dental  (+) Teeth Intact   Pulmonary neg pulmonary ROS, former smoker,    Pulmonary exam normal        Cardiovascular negative cardio ROS Normal cardiovascular exam     Neuro/Psych negative neurological ROS  negative psych ROS   GI/Hepatic negative GI ROS, Neg liver ROS,   Endo/Other  Adrenal insufficiency 2/2 chronic prednisone use  Renal/GU negative Renal ROS  negative genitourinary   Musculoskeletal  (+) Arthritis , Rheumatoid disorders,    Abdominal   Peds  Hematology negative hematology ROS (+)   Anesthesia Other Findings   Reproductive/Obstetrics                            Anesthesia Physical Anesthesia Plan  ASA: II  Anesthesia Plan: General   Post-op Pain Management: GA combined w/ Regional for post-op pain   Induction: Intravenous  PONV Risk Score and Plan: 3 and Ondansetron, Dexamethasone, Midazolam and Treatment may vary due to age or medical condition  Airway Management Planned: LMA  Additional Equipment: None  Intra-op Plan:   Post-operative Plan: Extubation in OR  Informed Consent: I have reviewed the patients History and Physical, chart, labs and discussed the procedure including the risks, benefits and alternatives for the proposed anesthesia with the patient or authorized representative who has indicated his/her understanding and acceptance.     Dental advisory given  Plan Discussed with:   Anesthesia Plan Comments: (Stress dose steroids for chronic prednisone use)       Anesthesia Quick Evaluation

## 2019-03-02 ENCOUNTER — Encounter: Payer: Self-pay | Admitting: *Deleted

## 2019-03-02 NOTE — Anesthesia Postprocedure Evaluation (Signed)
Anesthesia Post Note  Patient: Anne Garrett  Procedure(s) Performed: Left talonavicular and naviculocuneiform arthrodesis (Left Foot)     Patient location during evaluation: PACU Anesthesia Type: General Level of consciousness: awake and alert Pain management: pain level controlled Vital Signs Assessment: post-procedure vital signs reviewed and stable Respiratory status: spontaneous breathing, nonlabored ventilation and respiratory function stable Cardiovascular status: blood pressure returned to baseline and stable Postop Assessment: no apparent nausea or vomiting Anesthetic complications: no    Last Vitals:  Vitals:   03/01/19 1245 03/01/19 1354  BP: 106/76 111/68  Pulse: 78 83  Resp: 16 16  Temp:  37.3 C  SpO2: 96% 98%    Last Pain:  Vitals:   03/01/19 1354  TempSrc:   PainSc: 0-No pain                 Lidia Collum

## 2019-03-05 ENCOUNTER — Other Ambulatory Visit: Payer: Self-pay

## 2019-03-05 ENCOUNTER — Emergency Department (HOSPITAL_COMMUNITY)
Admission: EM | Admit: 2019-03-05 | Discharge: 2019-03-05 | Disposition: A | Discharge status: Home / Self Care | Payer: 59 | Attending: Emergency Medicine | Admitting: Emergency Medicine

## 2019-03-05 ENCOUNTER — Emergency Department (HOSPITAL_COMMUNITY): Payer: 59

## 2019-03-05 DIAGNOSIS — R202 Paresthesia of skin: Secondary | ICD-10-CM | POA: Insufficient documentation

## 2019-03-05 DIAGNOSIS — R1110 Vomiting, unspecified: Secondary | ICD-10-CM | POA: Insufficient documentation

## 2019-03-05 DIAGNOSIS — R1111 Vomiting without nausea: Secondary | ICD-10-CM | POA: Diagnosis not present

## 2019-03-05 DIAGNOSIS — R42 Dizziness and giddiness: Secondary | ICD-10-CM | POA: Diagnosis not present

## 2019-03-05 DIAGNOSIS — R112 Nausea with vomiting, unspecified: Secondary | ICD-10-CM | POA: Diagnosis not present

## 2019-03-05 DIAGNOSIS — R110 Nausea: Secondary | ICD-10-CM | POA: Diagnosis not present

## 2019-03-05 LAB — COMPREHENSIVE METABOLIC PANEL
ALT: 78 U/L — ABNORMAL HIGH (ref 0–44)
AST: 47 U/L — ABNORMAL HIGH (ref 15–41)
Albumin: 3.2 g/dL — ABNORMAL LOW (ref 3.5–5.0)
Alkaline Phosphatase: 107 U/L (ref 38–126)
Anion gap: 11 (ref 5–15)
BUN: <5 mg/dL — ABNORMAL LOW (ref 6–20)
CO2: 24 mmol/L (ref 22–32)
Calcium: 8.5 mg/dL — ABNORMAL LOW (ref 8.9–10.3)
Chloride: 104 mmol/L (ref 98–111)
Creatinine, Ser: 0.59 mg/dL (ref 0.44–1.00)
GFR calc Af Amer: >60 mL/min (ref >60)
GFR calc non Af Amer: >60 mL/min (ref >60)
Glucose, Bld: 89 mg/dL (ref 70–99)
Potassium: 3.3 mmol/L — ABNORMAL LOW (ref 3.5–5.1)
Sodium: 139 mmol/L (ref 135–145)
Total Bilirubin: 0.7 mg/dL (ref 0.3–1.2)
Total Protein: 5.7 g/dL — ABNORMAL LOW (ref 6.5–8.1)

## 2019-03-05 LAB — CBC WITH DIFFERENTIAL/PLATELET
Abs Immature Granulocytes: 0.05 10*3/uL (ref 0.00–0.07)
Basophils Absolute: 0 10*3/uL (ref 0.0–0.1)
Basophils Relative: 0 %
Eosinophils Absolute: 0.1 10*3/uL (ref 0.0–0.5)
Eosinophils Relative: 1 %
HCT: 33.6 % — ABNORMAL LOW (ref 36.0–46.0)
Hemoglobin: 10.7 g/dL — ABNORMAL LOW (ref 12.0–15.0)
Immature Granulocytes: 1 %
Lymphocytes Relative: 23 %
Lymphs Abs: 1.7 10*3/uL (ref 0.7–4.0)
MCH: 34 pg (ref 26.0–34.0)
MCHC: 31.8 g/dL (ref 30.0–36.0)
MCV: 106.7 fL — ABNORMAL HIGH (ref 80.0–100.0)
Monocytes Absolute: 0.6 10*3/uL (ref 0.1–1.0)
Monocytes Relative: 8 %
Neutro Abs: 4.9 10*3/uL (ref 1.7–7.7)
Neutrophils Relative %: 67 %
Platelets: 314 10*3/uL (ref 150–400)
RBC: 3.15 MIL/uL — ABNORMAL LOW (ref 3.87–5.11)
RDW: 13.5 % (ref 11.5–15.5)
WBC: 7.3 10*3/uL (ref 4.0–10.5)
nRBC: 0 % (ref 0.0–0.2)

## 2019-03-05 LAB — URINALYSIS, ROUTINE W REFLEX MICROSCOPIC
Bilirubin Urine: NEGATIVE
Glucose, UA: NEGATIVE mg/dL
Hgb urine dipstick: NEGATIVE
Ketones, ur: 5 mg/dL — AB
Leukocytes,Ua: NEGATIVE
Nitrite: NEGATIVE
Protein, ur: NEGATIVE mg/dL
Specific Gravity, Urine: 1.018 (ref 1.005–1.030)
pH: 8 (ref 5.0–8.0)

## 2019-03-05 LAB — I-STAT BETA HCG BLOOD, ED (MC, WL, AP ONLY): I-stat hCG, quantitative: <5 m[IU]/mL (ref <5)

## 2019-03-05 LAB — MAGNESIUM: Magnesium: 1.8 mg/dL (ref 1.7–2.4)

## 2019-03-05 LAB — LIPASE, BLOOD: Lipase: 25 U/L (ref 11–51)

## 2019-03-05 MED ORDER — POTASSIUM CHLORIDE CRYS ER 20 MEQ PO TBCR
40.0000 meq | EXTENDED_RELEASE_TABLET | Freq: Once | ORAL | Status: AC
  Administered 2019-03-05: 13:00:00 40 meq via ORAL
  Filled 2019-03-05: qty 2

## 2019-03-05 MED ORDER — LACTATED RINGERS IV BOLUS
1000.0000 mL | Freq: Once | INTRAVENOUS | Status: AC
  Administered 2019-03-05: 1000 mL via INTRAVENOUS

## 2019-03-05 MED ORDER — PROMETHAZINE HCL 12.5 MG PO TABS: 12.5000 mg | ORAL_TABLET | Freq: Three times a day (TID) | ORAL | 0 refills | Status: DC | PRN

## 2019-03-05 MED ORDER — METOCLOPRAMIDE HCL 5 MG/ML IJ SOLN
10.0000 mg | Freq: Once | INTRAMUSCULAR | Status: AC
  Administered 2019-03-05: 11:00:00 10 mg via INTRAVENOUS
  Filled 2019-03-05: qty 2

## 2019-03-05 MED ORDER — DIPHENHYDRAMINE HCL 50 MG/ML IJ SOLN
25.0000 mg | Freq: Once | INTRAMUSCULAR | Status: AC
  Administered 2019-03-05: 25 mg via INTRAVENOUS
  Filled 2019-03-05: qty 1

## 2019-03-05 MED ORDER — IOHEXOL 300 MG/ML  SOLN
100.0000 mL | Freq: Once | INTRAMUSCULAR | Status: AC | PRN
  Administered 2019-03-05: 100 mL via INTRAVENOUS

## 2019-03-05 NOTE — ED Provider Notes (Addendum)
[facility] EMERGENCY DEPARTMENT Provider Note   CSN: [id number] Arrival date & time: 03/05/19  [id number]     History Chief Complaint  Patient presents with  . Dizziness    Anne Garrett is a 44 y.o. female.  HPI 44 year old female presents with vomiting and numbness.  Primarily called 911 because of lightheadedness today.  She is been having vomiting since the beginning of February and was seen here where she had a fairly negative work-up.  Vomiting comes and goes in waves, often for days at a time.  Then she will have brief intermission and then feel poorly again.  Today the vomit was like "bile".  Describes as green.  Some abdominal discomfort.  Last week she had foot surgery.  Since then she has been constipated, thinks this might be partially medication related.  Had small bowel movement yesterday after medicines.  Today when she was standing at the sink she was feeling lightheaded and thought she might pass out.  No chest pain.  Since last week, just prior to surgery she has been having right facial numbness.  Has been a constant feeling.  At one point she thought there might be some numbness in her right forearm but this went away.  No facial droop.  No weakness in her extremities.  She is been trying Zofran and Phenergan.  The Zofran helps but for only minimal amount of time and then the Phenergan just makes her go to sleep. Some on and off headache, no headache currently.   Past Medical History:  Diagnosis Date  . Adrenal insufficiency ([place])    secondary to Prednisone  . Eczema    coner oif eye lid  . Headache    Migraine  . Heart murmur 2012   -? heard at PCP visit 10-15 years ago-had ECHO -benign  . Intervertebral disk disease   . Osteoporosis   . Rheumatoid arthritis ([place])    Juvenile onset    Patient Active Problem List   Diagnosis Date Noted  . 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 Lumbar pseudoarthrosis 06/04/2016  . Lumbar  stenosis 04/08/2016  . S/P cervical spinal fusion 07/13/2015  . Cervical stenosis of spinal canal 07/12/2015  . HNP (herniated nucleus pulposus), cervical 06/19/2015  . Numbness 05/20/2015  . Chronic rheumatic arthritis ([place]) 05/20/2015  . Carpal tunnel syndrome 05/20/2015  . Snoring 05/20/2015  . Excessive daytime sleepiness 05/20/2015    Past Surgical History:  Procedure Laterality Date  . ANKLE ARTHROPLASTY Right   . ANTERIOR CERVICAL DECOMP/DISCECTOMY FUSION N/A 06/19/2015   Procedure: C3-4 C4-5 Anterior cervical decompression/diskectomy/fusion;  Surgeon: Jovita Gamma, MD;  Location: [facility];  Service: Neurosurgery;  Laterality: N/A;  C3-4 C4-5 Anterior cervical decompression/diskectomy/fusion  . APPENDECTOMY    . CARPAL TUNNEL RELEASE Right   . CESAREAN SECTION    . CHOLECYSTECTOMY    . COLONOSCOPY    . ESSURE TUBAL LIGATION     Essure Implant-Permanent birth control  . excision to palm Left    excision of mass to right plam  . FOOT ARTHRODESIS Left 03/01/2019   Procedure: Left talonavicular and naviculocuneiform arthrodesis;  Surgeon: [name], MD;  Location: [facility];  Service: Orthopedics;  Laterality: Left;  . HARDWARE REMOVAL Right 02/02/2017   Procedure: RIGHT WRIST DEEP IMPLANT REMOVAL, RIGHT WRIST AND HAND EXTENSOR TENDON RECONSTRUCTION, TENDON TRANSFER AND RECONSTRUCTION AS INDICATED;  Surgeon: Iran Planas, MD;  Location: [facility];  Service: Orthopedics;  Laterality: Right;  .  JOINT REPLACEMENT Bilateral    bilat hip replacements  . ORIF FEMUR FRACTURE Right   . OTHER SURGICAL HISTORY  06/04/2016   Revision of PLIF  . plif l5-s1 arthrodeisis    2018  . POSTERIOR CERVICAL FUSION/FORAMINOTOMY N/A 07/12/2015   Procedure: C2 to C5 Cervical laminectomy, C2 to C5 posterior cervical arthrodesis with instrumentation and bone graft;  Surgeon: Jovita Gamma, MD;  Location: [facility];  Service: Neurosurgery;  Laterality: N/A;  C2 to C5 Cervical  laminectomy, C2  to C5 posterior cervical arthrodesis with instrumentation and bone graft  . REPAIR EXTENSOR TENDON Right 02/02/2017   Procedure: REPAIR EXTENSOR TENDON;  Surgeon: Iran Planas, MD;  Location: [facility];  Service: Orthopedics;  Laterality: Right;  . REVERSE SHOULDER ARTHROPLASTY Left 09/07/2018   Procedure: REVERSE SHOULDER ARTHROPLASTY;  Surgeon: [name], MD;  Location: [facility];  Service: Orthopedics;  Laterality: Left;  . right foot surgery Right    right jfoot arthrodesis and right ankle arthroplasty  . WRIST SURGERY Bilateral    arthrodesis     OB History   No obstetric history on file.     Family History  Problem Relation Age of Onset  . Breast cancer Mother 40  . Nephritis Father   . Epilepsy Father   . Breast cancer Maternal Aunt        2 mat aunts   . Multiple sclerosis Maternal Uncle   . Psoriasis Paternal Aunt     Social History   Tobacco Use  . Smoking status: Former Smoker    Packs/day: 0.25    Years: 20.00    Pack years: 5.00    Types: Cigarettes    Quit date: 07/02/2015    Years since quitting: 3.6  . Smokeless tobacco: Never Used  Substance Use Topics  . Alcohol use: Yes    Alcohol/week: 2.0 standard drinks    Types: 2 Glasses of wine per week    Comment: social  . Drug use: No    Home Medications Prior to Admission medications   Medication Sig Start Date End Date Taking? Authorizing Provider  aspirin EC 81 MG tablet Take 1 tablet (81 mg total) by mouth 2 (two) times daily. 03/01/19   Corky Sing, PA-C  baricitinib (OLUMIANT) 2 MG TABS tablet Take 1 tablet (2 mg total) by mouth daily. Patient taking differently: Take 2 mg by mouth daily.  01/17/19   Tresa Garter, MD  Butalbital-APAP-Caffeine [phone] MG capsule Take 1 capsule by mouth daily as needed for migraine. 05/15/18   [provider]  Calcium Carb-Cholecalciferol (CALCIUM+D3 PO) Take 1 tablet by mouth daily.    [provider]  Cholecalciferol  (VITAMIN D3) 50 MCG (2000 UT) TABS Take 2,000 Units by mouth daily.    [provider]  docusate sodium (COLACE) 100 MG capsule Take 1 capsule (100 mg total) by mouth daily as needed. 03/01/19 02/29/20  Corky Sing, PA-C  ibandronate (BONIVA) 150 MG tablet Take 150 mg by mouth every 30 (thirty) days. Take in the morning with a full glass of water, on an empty stomach, and do not take anything else by mouth or lie down for the next 30 min.    [provider]  ibuprofen (ADVIL,MOTRIN) 200 MG tablet Take 600-800 mg by mouth 2 (two) times daily as needed for headache, mild pain or moderate pain.     [provider]  leucovorin (WELLCOVORIN) 5 MG tablet Take 5 mg by mouth daily. 07/27/18  [provider]  methotrexate 250 MG/10ML injection Inject 10 mg into the skin every Saturday. 07/27/18   [provider]  Multiple Vitamin (MULTIVITAMIN WITH MINERALS) TABS tablet Take 1 tablet by mouth daily.    [provider]  ondansetron (ZOFRAN) 8 MG tablet Take 8 mg by mouth every 8 (eight) hours as needed for nausea or vomiting.    [provider]  oxyCODONE (ROXICODONE) 5 MG immediate release tablet Take 1 tablet (5 mg total) by mouth every 4 (four) hours as needed for up to 5 days for moderate pain or severe pain. 03/01/19 03/06/19  Corky Sing, PA-C  pantoprazole (PROTONIX) 40 MG tablet Take 40 mg by mouth 2 (two) times daily.    [provider]  predniSONE (DELTASONE) 5 MG tablet Take 5 mg by mouth every evening.     [provider]  promethazine (PHENERGAN) 12.5 MG tablet Take 1-2 tablets (12.5-25 mg total) by mouth every 8 (eight) hours as needed for nausea or vomiting. 03/05/19   Sherwood Gambler, MD  senna (SENOKOT) 8.6 MG TABS tablet Take 2 tablets (17.2 mg total) by mouth 2 (two) times daily. 03/01/19   Corky Sing, PA-C    Allergies    Sulfa antibiotics, Teriparatide, Valproic acid and related, Celexa  [citalopram], Leflunomide, Paroxetine hcl, and Zoloft [sertraline]  Review of Systems   Review of Systems  Constitutional: Negative for fever.  Cardiovascular: Negative for chest pain.  Gastrointestinal: Positive for abdominal pain, constipation and vomiting.  Genitourinary: Positive for decreased urine volume.  Neurological: Positive for light-headedness, numbness and headaches.  All other systems reviewed and are negative.   Physical Exam Updated Vital Signs BP 108/76 (BP Location: Right Arm)   Pulse 68   Temp 99.4 F (37.4 C) (Oral)   Resp 18   Ht 4\' 11"  (1.499 m)   Wt 45.4 kg   SpO2 100%   BMI 20.20 kg/m   Physical Exam Vitals and nursing note reviewed.  Constitutional:      General: She is not in acute distress.    Appearance: She is well-developed. She is not ill-appearing or diaphoretic.  HENT:     Head: Normocephalic and atraumatic.     Right Ear: External ear normal.     Left Ear: External ear normal.     Nose: Nose normal.  Eyes:     General:        Right eye: No discharge.        Left eye: No discharge.     Extraocular Movements: Extraocular movements intact.     Pupils: Pupils are equal, round, and reactive to light.  Cardiovascular:     Rate and Rhythm: Normal rate and regular rhythm.     Heart sounds: Normal heart sounds.  Pulmonary:     Effort: Pulmonary effort is normal.     Breath sounds: Normal breath sounds.  Abdominal:     Palpations: Abdomen is soft.     Tenderness: There is generalized abdominal tenderness (mild). There is no guarding.  Skin:    General: Skin is warm and dry.  Neurological:     Mental Status: She is alert.     Comments: CN 3-12 grossly intact save for subjective decreased sensation through right face. 5/5 strength in all 4 extremities. Grossly normal sensation.   Psychiatric:        Mood and Affect: Mood is not anxious.     ED Results / Procedures / Treatments   Labs (all  labs ordered are listed, but only abnormal  results are displayed) Labs Reviewed  COMPREHENSIVE METABOLIC PANEL - Abnormal; Notable for the following components:      Result Value   Potassium 3.3 (*)    BUN <5 (*)    Calcium 8.5 (*)    Total Protein 5.7 (*)    Albumin 3.2 (*)    AST 47 (*)    ALT 78 (*)    All other components within normal limits  URINALYSIS, ROUTINE W REFLEX MICROSCOPIC - Abnormal; Notable for the following components:   Color, Urine COLORLESS (*)    Ketones, ur 5 (*)    All other components within normal limits  CBC WITH DIFFERENTIAL/PLATELET - Abnormal; Notable for the following components:   RBC 3.15 (*)    Hemoglobin 10.7 (*)    HCT 33.6 (*)    MCV 106.7 (*)    All other components within normal limits  LIPASE, BLOOD  MAGNESIUM  I-STAT BETA HCG BLOOD, ED ([facility], [facility], [facility] ONLY)    EKG EKG Interpretation  Date/Time:  Monday March 05 2019 10:17:53 EST Ventricular Rate:  82 PR Interval:    QRS Duration: 99 QT Interval:  465 QTC Calculation: 544 R Axis:   73 Text Interpretation: Sinus rhythm Prolonged QT interval Confirmed by Sherwood Gambler [phone]) on 03/05/2019 10:45:00 AM   Radiology CT ABDOMEN PELVIS W CONTRAST  Result Date: 03/05/2019 CLINICAL DATA:  Nausea and vomiting. EXAM: CT ABDOMEN AND PELVIS WITH CONTRAST TECHNIQUE: Multidetector CT imaging of the abdomen and pelvis was performed using the standard protocol following bolus administration of intravenous contrast. CONTRAST:  182mL OMNIPAQUE IOHEXOL 300 MG/ML  SOLN COMPARISON:  None. FINDINGS: Lower chest: Normal. Hepatobiliary: 11 mm area of focal fatty infiltration in the left lobe adjacent to the falciform ligament. 2.6 cm hemangioma in the inferior aspect of the right lobe of the liver. Liver parenchyma is otherwise normal. Cholecystectomy. No dilated bile ducts. Pancreas: Unremarkable. No pancreatic ductal dilatation or surrounding inflammatory changes. Spleen: Normal in size without focal abnormality. Adrenals/Urinary Tract: Adrenal glands  are unremarkable. Kidneys are normal, without renal calculi, focal lesion, or hydronephrosis. Bladder is unremarkable. Stomach/Bowel: Small hiatal hernia. Slight edema in the mucosa of the distal stomach. Small bowel appears normal including the terminal ileum. Colon is normal. Appendectomy. Vascular/Lymphatic: No significant vascular findings are present. No enlarged abdominal or pelvic lymph nodes. Reproductive: The cervix is slightly prominent. Prominent endometrium of the uterus. 14 mm lesion in the left side of the fundus of the uterus, most likely a small fibroid. Tubal ligation devices in place. 12 mm cyst or dominant follicle in the right ovary. Normal left ovary. Other: No abdominal wall hernia or abnormality. No abdominopelvic ascites. Musculoskeletal: No acute abnormality. Bilateral hip prostheses. Previous lumbar fusion from L4-S1. Spondylolisthesis at L5-S1. IMPRESSION: 1. Slight edema in the mucosa of the distal stomach which could represent gastritis. 2. Small hiatal hernia. 3. Hemangioma in the right lobe of the liver. 4. Slight prominence of the uterus and cervix, nonspecific. Electronically Signed   By: Lorriane Shire M.D.   On: 03/05/2019 12:09    Procedures Procedures (including critical care time)  Medications Ordered in ED Medications  lactated ringers bolus 1,000 mL (1,000 mLs Intravenous New Bag/Given 03/05/19 1106)  metoCLOPramide (REGLAN) injection 10 mg (10 mg Intravenous Given 03/05/19 1058)  diphenhydrAMINE (BENADRYL) injection 25 mg (25 mg Intravenous Given 03/05/19 1118)  iohexol (OMNIPAQUE) 300 MG/ML solution 100 mL (100 mLs Intravenous Contrast Given 03/05/19 1149)  potassium chloride SA (KLOR-CON) CR tablet 40 mEq (40 mEq Oral Given 03/05/19 1305)    ED Course  I have reviewed the triage vital signs and the nursing notes.  Pertinent labs & imaging results that were available during my care of the patient were reviewed by me and considered in my medical decision making (see  chart for details).    MDM Rules/Calculators/A&P                      Patient has not had any vomiting while in the emergency department.  Vital signs are stable.  She was given fluids.  Her lightheadedness sounds like presyncope from vomiting.  At this point, she feels well enough for discharge.  Minimal hypokalemia will be repleted.  She was aware of the CT finding of hemangioma.  She does endorse the numbness to her face for over 1 week.  Offered MRI though at this point my suspicion of a stroke is pretty low.  However concern for other pathology such as MS.  She declines.  I think this is reasonable and she can follow-up as an outpatient. She is noted to have a mildly prolonged QT interval.  However visually this looks very similar to her most recent ECG.  I doubt she had an arrhythmia causing her symptoms or is at particularly high risk for an arrhythmia.  Discussed return precautions. Final Clinical Impression(s) / ED Diagnoses Final diagnoses:  Vomiting in adult  Paresthesia    Rx / DC Orders ED Discharge Orders         Ordered    promethazine (PHENERGAN) 12.5 MG tablet  Every 8 hours PRN     03/05/19 1402           Sherwood Gambler, MD 03/05/19 1414    Sherwood Gambler, MD 03/05/19 1415

## 2019-03-05 NOTE — ED Triage Notes (Signed)
Pt. States she is dizziness and N/V x 35month off and on. Patient stated she vomit 4 times since midnight today and she takes PO Zofran however its not working. Last time taking Zofran at 6 this morning.  Pt not actively vomiting at this time Had negative pregnant on Monday prior to surgery.  Infusion on left foot on Monday.

## 2019-03-05 NOTE — Discharge Instructions (Signed)
If you develop worsening, continued, or recurrent abdominal pain, uncontrolled vomiting, fever, chest or back pain, or any other new/concerning symptoms then return to the ER for evaluation.  

## 2019-03-05 NOTE — ED Notes (Signed)
Got patient off the bedpan patient is resting with call bell in reach 

## 2019-03-05 NOTE — ED Notes (Signed)
Asked patient to get urine sample patient could not at this time will try later

## 2019-03-05 NOTE — ED Notes (Signed)
Placed patient on the bedpan patient did well

## 2019-03-05 NOTE — ED Notes (Signed)
Patient stated she was crawling out of her skin. MD made aware

## 2019-03-05 NOTE — ED Notes (Signed)
Attempted IV in left outer AC unsuccessful.

## 2019-03-08 DIAGNOSIS — R6881 Early satiety: Secondary | ICD-10-CM | POA: Diagnosis not present

## 2019-03-08 DIAGNOSIS — K3189 Other diseases of stomach and duodenum: Secondary | ICD-10-CM | POA: Diagnosis not present

## 2019-03-08 DIAGNOSIS — R112 Nausea with vomiting, unspecified: Secondary | ICD-10-CM | POA: Diagnosis not present

## 2019-03-08 DIAGNOSIS — R935 Abnormal findings on diagnostic imaging of other abdominal regions, including retroperitoneum: Secondary | ICD-10-CM | POA: Diagnosis not present

## 2019-03-08 DIAGNOSIS — R195 Other fecal abnormalities: Secondary | ICD-10-CM | POA: Diagnosis not present

## 2019-03-09 ENCOUNTER — Other Ambulatory Visit: Payer: Self-pay | Admitting: Family Medicine

## 2019-03-09 DIAGNOSIS — R112 Nausea with vomiting, unspecified: Secondary | ICD-10-CM | POA: Diagnosis not present

## 2019-03-09 DIAGNOSIS — R200 Anesthesia of skin: Secondary | ICD-10-CM

## 2019-03-09 DIAGNOSIS — R519 Headache, unspecified: Secondary | ICD-10-CM | POA: Diagnosis not present

## 2019-03-12 ENCOUNTER — Other Ambulatory Visit: Payer: Self-pay

## 2019-03-12 ENCOUNTER — Ambulatory Visit
Admission: RE | Admit: 2019-03-12 | Discharge: 2019-03-12 | Disposition: A | Discharge status: Home / Self Care | Payer: 59 | Source: Ambulatory Visit | Attending: Family Medicine | Admitting: Family Medicine

## 2019-03-12 DIAGNOSIS — R519 Headache, unspecified: Secondary | ICD-10-CM

## 2019-03-12 DIAGNOSIS — R200 Anesthesia of skin: Secondary | ICD-10-CM

## 2019-03-12 MED ORDER — GADOBENATE DIMEGLUMINE 529 MG/ML IV SOLN
10.0000 mL | Freq: Once | INTRAVENOUS | Status: AC | PRN
  Administered 2019-03-12: 10 mL via INTRAVENOUS

## 2019-03-12 MED FILL — traMADol HCL 50 MG TABS: 50 | 10 days supply | Qty: 60 | Fill #1

## 2019-03-15 DIAGNOSIS — Z4889 Encounter for other specified surgical aftercare: Secondary | ICD-10-CM | POA: Diagnosis not present

## 2019-03-15 DIAGNOSIS — M19272 Secondary osteoarthritis, left ankle and foot: Secondary | ICD-10-CM | POA: Diagnosis not present

## 2019-03-20 MED FILL — BUTALBIT-ACETAMINOPHEN-CAFF: 50-325-40 | 5 days supply | Qty: 30 | Fill #0

## 2019-03-26 MED FILL — OLUMIANT 2 MG TABS: 2 | 30 days supply | Qty: 30 | Fill #1

## 2019-03-27 ENCOUNTER — Encounter: Payer: Self-pay | Admitting: General Practice

## 2019-04-06 DIAGNOSIS — M19272 Secondary osteoarthritis, left ankle and foot: Secondary | ICD-10-CM | POA: Diagnosis not present

## 2019-04-06 DIAGNOSIS — M79672 Pain in left foot: Secondary | ICD-10-CM | POA: Diagnosis not present

## 2019-04-09 MED FILL — traMADol HCL 50 MG TABS: 50 | 10 days supply | Qty: 60 | Fill #2

## 2019-04-12 ENCOUNTER — Other Ambulatory Visit: Payer: 59

## 2019-04-13 DIAGNOSIS — Z4889 Encounter for other specified surgical aftercare: Secondary | ICD-10-CM | POA: Diagnosis not present

## 2019-04-13 DIAGNOSIS — M19272 Secondary osteoarthritis, left ankle and foot: Secondary | ICD-10-CM | POA: Diagnosis not present

## 2019-04-13 DIAGNOSIS — M79672 Pain in left foot: Secondary | ICD-10-CM | POA: Diagnosis not present

## 2019-04-23 MED FILL — traMADol HCL 50 MG TABS: 50 | 20 days supply | Qty: 120 | Fill #0

## 2019-04-30 ENCOUNTER — Other Ambulatory Visit (HOSPITAL_COMMUNITY): Payer: Self-pay | Admitting: Rheumatology

## 2019-04-30 DIAGNOSIS — K121 Other forms of stomatitis: Secondary | ICD-10-CM | POA: Diagnosis not present

## 2019-04-30 DIAGNOSIS — M810 Age-related osteoporosis without current pathological fracture: Secondary | ICD-10-CM | POA: Diagnosis not present

## 2019-04-30 DIAGNOSIS — R112 Nausea with vomiting, unspecified: Secondary | ICD-10-CM | POA: Diagnosis not present

## 2019-04-30 DIAGNOSIS — Z79899 Other long term (current) drug therapy: Secondary | ICD-10-CM | POA: Diagnosis not present

## 2019-04-30 DIAGNOSIS — M0579 Rheumatoid arthritis with rheumatoid factor of multiple sites without organ or systems involvement: Secondary | ICD-10-CM | POA: Diagnosis not present

## 2019-04-30 DIAGNOSIS — M79604 Pain in right leg: Secondary | ICD-10-CM | POA: Diagnosis not present

## 2019-04-30 DIAGNOSIS — M150 Primary generalized (osteo)arthritis: Secondary | ICD-10-CM | POA: Diagnosis not present

## 2019-04-30 DIAGNOSIS — M79605 Pain in left leg: Secondary | ICD-10-CM | POA: Diagnosis not present

## 2019-04-30 DIAGNOSIS — M2550 Pain in unspecified joint: Secondary | ICD-10-CM | POA: Diagnosis not present

## 2019-04-30 LAB — HEPATIC FUNCTION PANEL
ALT: 16 (ref 7–35)
AST: 21 (ref 13–35)
Alkaline Phosphatase: 63 (ref 25–125)

## 2019-04-30 LAB — BASIC METABOLIC PANEL
BUN: 17 (ref 4–21)
CO2: 23 — AB (ref 13–22)
Chloride: 103 (ref 99–108)
Creatinine: 0.6 (ref 0.5–1.1)
Glucose: 91
Potassium: 4.1 (ref 3.4–5.3)
Sodium: 139 (ref 137–147)

## 2019-04-30 MED FILL — OLUMIANT 2 MG TABS: 2 | 30 days supply | Qty: 30 | Fill #2

## 2019-05-01 LAB — LIPID PANEL
Cholesterol: 213 — AB (ref 0–200)
HDL: 82 — AB (ref 35–70)
LDL Cholesterol: 117
Triglycerides: 82 (ref 40–160)

## 2019-05-01 LAB — CBC AND DIFFERENTIAL
Neutrophils Absolute: 5
Platelets: 458 — AB (ref 150–399)

## 2019-05-01 LAB — COMPREHENSIVE METABOLIC PANEL
Albumin: 4.3 (ref 3.5–5.0)
Calcium: 9 (ref 8.7–10.7)
GFR calc Af Amer: 127
GFR calc non Af Amer: 110
Globulin: 2.3

## 2019-05-11 ENCOUNTER — Other Ambulatory Visit: Payer: Self-pay

## 2019-05-11 ENCOUNTER — Encounter: Payer: Self-pay | Admitting: Family Medicine

## 2019-05-11 ENCOUNTER — Ambulatory Visit (INDEPENDENT_AMBULATORY_CARE_PROVIDER_SITE_OTHER): Payer: 59 | Admitting: Family Medicine

## 2019-05-11 VITALS — BP 100/60 | HR 89 | Temp 98.2°F | Resp 15 | Ht 59.0 in | Wt 112.6 lb

## 2019-05-11 DIAGNOSIS — D84821 Immunodeficiency due to drugs: Secondary | ICD-10-CM

## 2019-05-11 DIAGNOSIS — T380X5A Adverse effect of glucocorticoids and synthetic analogues, initial encounter: Secondary | ICD-10-CM

## 2019-05-11 DIAGNOSIS — L719 Rosacea, unspecified: Secondary | ICD-10-CM

## 2019-05-11 DIAGNOSIS — D849 Immunodeficiency, unspecified: Secondary | ICD-10-CM

## 2019-05-11 DIAGNOSIS — M0800 Unspecified juvenile rheumatoid arthritis of unspecified site: Secondary | ICD-10-CM | POA: Diagnosis not present

## 2019-05-11 DIAGNOSIS — Z7952 Long term (current) use of systemic steroids: Secondary | ICD-10-CM

## 2019-05-11 DIAGNOSIS — G43009 Migraine without aura, not intractable, without status migrainosus: Secondary | ICD-10-CM | POA: Diagnosis not present

## 2019-05-11 DIAGNOSIS — Z760 Encounter for issue of repeat prescription: Secondary | ICD-10-CM | POA: Diagnosis not present

## 2019-05-11 DIAGNOSIS — K219 Gastro-esophageal reflux disease without esophagitis: Secondary | ICD-10-CM

## 2019-05-11 MED ORDER — TRIAMCINOLONE ACETONIDE 0.1 % EX CREA: 1.0000 "application " | TOPICAL_CREAM | Freq: Two times a day (BID) | CUTANEOUS | 1 refills | Status: AC | PRN

## 2019-05-11 MED ORDER — BUTALBITAL-APAP-CAFFEINE 50-325-40 MG PO CAPS: 1.0000 | ORAL_CAPSULE | Freq: Every day | ORAL | 0 refills | Status: DC | PRN

## 2019-05-11 MED ORDER — METRONIDAZOLE 0.75 % EX CREA: TOPICAL_CREAM | Freq: Two times a day (BID) | CUTANEOUS | 11 refills | Status: DC

## 2019-05-11 NOTE — Progress Notes (Signed)
Subjective  CC:  Chief Complaint  Patient presents with  . New Patient (Initial Visit)    Dr. Alroy Dust from [place]  . Numbness    in tongue started with "illness from Feb"  . Migraine    worst during Oct.-March    HPI: Anne Garrett is a 44 y.o. female who presents to [facility] [place] today to establish care with me as a new patient.   She has the following concerns or needs:  44 yo with long standing complicated juvenile onset RA s/p multiple surgeries and h/o chronic prednisone use managed by rheum, Dr. Trudie Reed and multiple orthopedics w/ recent atypical illness w/ fever and gingival infection and toungue sxs that fortuantely has mostly resolved. Now with persistent right facial numbness and a "cool" sensation in her mouth. She brings in paperwork documenting in her own words many of her sxs and problems. See scanned in her chart.   I reviwed her PMH. I do not yet have old records for review.   I have reviewed her ER visits from this year. She did have a nrmal brain MRI which is reassuring given her sxs of numbness.   She needs meds for her intermittent migraines ( has failed triptans and topamax), rosacea, and eczema.   She sees gyn for female care.   Assessment  1. Juvenile rheumatoid arthritis ([place])   2. Gastroesophageal reflux disease, unspecified whether esophagitis present   3. Migraine without aura and without status migrainosus, not intractable   4. Immunosuppression due to chronic steroid use   5. Immunosuppression ([place])   6. Rosacea      Plan   Today's visit was 30 minutes long. Greater than 50% of this time was devoted to face to face counseling with the patient and coordination of care. We discussed her diagnosis, prognosis, treatment options and treatment plan is documented below.   RA: complicated and limiting. Managed by rheum. Immunosuppressed.   Atypical complex of sxs but no red flags identified today. Reassured and  will monitor over time.   Refilled migraines meds; well managed  Rosacea: metrocream.   Return for annual cpe.   Follow up:  Return in about 3 months (around 08/11/2019) for complete physical. No orders of the defined types were placed in this encounter.  Meds ordered this encounter  Medications  . Butalbital-APAP-Caffeine 50-325-40 MG capsule    Sig: Take 1 capsule by mouth daily as needed.    Dispense:  30 capsule    Refill:  0  . metroNIDAZOLE (METROCREAM) 0.75 % cream    Sig: Apply topically 2 (two) times daily.    Dispense:  45 g    Refill:  11  . triamcinolone cream (KENALOG) 0.1 %    Sig: Apply 1 application topically 2 (two) times daily as needed.    Dispense:  15 g    Refill:  1     No flowsheet data found.  We updated and reviewed the patient's past history in detail and it is documented below.  Patient Active Problem List   Diagnosis Date Noted  . Juvenile rheumatoid arthritis ([place]) 05/13/2019  . Family history of breast cancer 05/13/2019  . GERD (gastroesophageal reflux disease) 05/13/2019  . Migraines 05/13/2019  . Immunosuppression due to chronic steroid use 05/13/2019  . Rosacea 05/13/2019  . 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 Lumbar pseudoarthrosis 06/04/2016  . Lumbar stenosis 04/08/2016  . S/P cervical spinal fusion 07/13/2015  .  Cervical stenosis of spinal canal 07/12/2015  . HNP (herniated nucleus pulposus), cervical 06/19/2015  . VAIN I (vaginal intraepithelial neoplasia grade I) 08/24/2013    Formatting of this note might be different from the original. On biopsy Aug 2015   . LGSIL (low grade squamous intraepithelial dysplasia) 08/20/2013    Formatting of this note might be different from the original. On pap smear June 2015.   HR HPV positive.  Colposcopy and cervical AND vaginal biopsies done Aug 2015   . H/O Clostridium difficile infection 01/16/2013    Formatting of this note might be different from the original.  2013 and 2014 (vaginal cleocin triggered the second one)   . Hemangioma 01/16/2013  . Immunosuppression ([place]) 01/16/2013    Formatting of this note might be different from the original. Due to chronic prednisone therapy   . Secondary adrenal insufficiency ([place]) 01/16/2013    Formatting of this note might be different from the original. Prednisone therapy   . Osteoporosis 12/28/2012    Formatting of this note might be different from the original. Per patient, attributed to chronic steroids    Health Maintenance  Topic Date Due  . HIV Screening  Never done  . PAP SMEAR-Modifier  Never done  . INFLUENZA VACCINE  08/05/2019  . TETANUS/TDAP  01/12/2027  . COVID-19 Vaccine  Completed   Immunization History  Administered Date(s) Administered  . HPV 9-valent 12/06/2016, 03/28/2017, 07/22/2017  . Hepatitis B 01/05/2003  . Influenza Inj Mdck Quad With Preservative 10/10/2017, 10/05/2018  . Influenza,inj,Quad PF,6+ Mos 09/11/2013  . Influenza-Unspecified 09/04/2013, 10/10/2014, 10/10/2015, 10/05/2016, 10/09/2017, 10/17/2018  . PFIZER SARS-COV-2 Vaccination 01/23/2019, 02/16/2019  . Tdap 01/04/2006, 01/11/2017   Current Meds  Medication Sig  . baricitinib (OLUMIANT) 2 MG TABS tablet Take 1 tablet (2 mg total) by mouth daily. (Patient taking differently: Take 2 mg by mouth daily. )  . Butalbital-APAP-Caffeine 50-325-40 MG capsule Take 1 capsule by mouth daily as needed.  . Calcium Carb-Cholecalciferol (CALCIUM+D3 PO) Take 1 tablet by mouth daily.  . Cholecalciferol (VITAMIN D3) 50 MCG (2000 UT) TABS Take 2,000 Units by mouth daily.  Marland Kitchen ibandronate (BONIVA) 150 MG tablet Take 150 mg by mouth every 30 (thirty) days. Take in the morning with a full glass of water, on an empty stomach, and do not take anything else by mouth or lie down for the next 30 min.  Marland Kitchen ibuprofen (ADVIL,MOTRIN) 200 MG tablet Take 600-800 mg by mouth 2 (two) times daily as needed for headache, mild pain or moderate pain.    Marland Kitchen leucovorin (WELLCOVORIN) 5 MG tablet Take 5 mg by mouth daily.  . methotrexate 250 MG/10ML injection Inject 10 mg into the skin every Saturday.  . Multiple Vitamin (MULTIVITAMIN WITH MINERALS) TABS tablet Take 1 tablet by mouth daily.  . predniSONE (DELTASONE) 5 MG tablet Take 5 mg by mouth every evening.   . [DISCONTINUED] Butalbital-APAP-Caffeine (FIORICET PO) Take by mouth.  . [DISCONTINUED] Butalbital-APAP-Caffeine 50-325-40 MG capsule Take 1 capsule by mouth daily as needed for migraine.    Allergies: Patient is allergic to sulfa antibiotics; teriparatide; valproic acid and related; celexa [citalopram]; leflunomide; paroxetine hcl; and zoloft [sertraline]. Past Medical History Patient  has a past medical history of Adrenal insufficiency ([place]), Eczema, Headache, Heart murmur (2012), Intervertebral disk disease, Juvenile idiopathic arthritis ([place]), Osteoporosis, and Rheumatoid arthritis ([place]). Past Surgical History Patient  has a past surgical history that includes Appendectomy; Cholecystectomy; Essure tubal ligation; Joint replacement (Bilateral); Carpal tunnel release (Right); Cesarean section; Ankle  arthroplasty (Right); ORIF femur fracture (Right); Wrist surgery (Bilateral); excision to palm (Left); right foot surgery (Right); Anterior cervical decomp/discectomy fusion (N/A, 06/19/2015); Colonoscopy; Posterior cervical fusion/foraminotomy (N/A, 07/12/2015); plif l5-s1 arthrodeisis   (2018); OTHER SURGICAL HISTORY (06/04/2016); Hardware Removal (Right, 02/02/2017); Repair extensor tendon (Right, 02/02/2017); Reverse shoulder arthroplasty (Left, 09/07/2018); Foot arthrodesis (Left, 03/01/2019); and Cholecystectomy (2006). Family History: Patient family history includes Breast cancer in her maternal aunt; Breast cancer (age of onset: 13) in her mother; Diabetes in her daughter; Epilepsy in her father; Multiple sclerosis in her maternal uncle; Nephritis in her father; Psoriasis in her paternal aunt.  Social History:  Patient  reports that she quit smoking about 3 years ago. Her smoking use included cigarettes. She has a 5.00 pack-year smoking history. She has never used smokeless tobacco. She reports current alcohol use of about 2.0 standard drinks of alcohol per week. She reports that she does not use drugs.  Review of Systems: Constitutional: negative for fever or malaise Ophthalmic: negative for photophobia, double vision or loss of vision Cardiovascular: negative for chest pain, dyspnea on exertion, or new LE swelling Respiratory: negative for SOB or persistent cough  Patient Care Team    Relationship Specialty Notifications Start End  [name], MD PCP - General Family Medicine  05/11/19   Pa, [facility]  05/11/19   [name], MD Consulting Physician Rheumatology  05/13/19   [name], MD Consulting Physician Orthopedic Surgery  05/13/19   [name], MD Consulting Physician Orthopedic Surgery  05/13/19   [name], MD Consulting Physician Orthopedic Surgery  05/13/19   [name], DO Consulting Physician Obstetrics and Gynecology  05/13/19     Objective  Vitals: BP 100/60   Pulse 89   Temp 98.2 F (36.8 C) (Temporal)   Resp 15   Ht 4\' 11"  (1.499 m)   Wt 112 lb 9.6 oz (51.1 kg)   SpO2 97%   BMI 22.74 kg/m  General:  Well developed, well nourished, no acute distress  Psych:  Alert and oriented,normal mood and affect HEENT:  Normocephalic, atraumatic, non-icteric sclera,  Cardiovascular:  RRR without gallop, rub or murmur Respiratory:  Good breath sounds bilaterally, CTAB with normal respiratory effort  MSK: multiple RA changes and well  Healed surgical scars on extremities Skin:  Warm, no rashes or suspicious lesions noted Neurologic:    Mental status is normal.abnormal gait  Commons side effects, risks, benefits, and alternatives for medications and treatment plan prescribed today were discussed, and the patient expressed  understanding of the given instructions. Patient is instructed to call or message via MyChart if he/she has any questions or concerns regarding our treatment plan. No barriers to understanding were identified. We discussed Red Flag symptoms and signs in detail. Patient expressed understanding regarding what to do in case of urgent or emergency type symptoms.   Medication list was reconciled, printed and provided to the patient in AVS. Patient instructions and summary information was reviewed with the patient as documented in the AVS. This note was prepared with assistance of Dragon voice recognition software. Occasional wrong-word or sound-a-like substitutions may have occurred due to the inherent limitations of voice recognition software  This visit occurred during the SARS-CoV-2 public health emergency.  Safety protocols were in place, including screening questions prior to the visit, additional usage of staff PPE, and extensive cleaning of exam room while observing appropriate contact time as indicated for disinfecting solutions.

## 2019-05-11 NOTE — Patient Instructions (Signed)
Please return in 2-3 months for your annual complete physical; please come fasting OR upload your labwork from Dr. Trudie Reed into your mychart account.  It was a pleasure meeting you today! Thank you for choosing [place] to meet your healthcare needs! I truly look forward to working with you. If you have any questions or concerns, please send me a message via Mychart or call the office at [phone].  Please give me a call if you need anything.

## 2019-05-13 DIAGNOSIS — L719 Rosacea, unspecified: Secondary | ICD-10-CM | POA: Insufficient documentation

## 2019-05-13 DIAGNOSIS — D84821 Immunodeficiency due to drugs: Secondary | ICD-10-CM | POA: Insufficient documentation

## 2019-05-13 DIAGNOSIS — T380X5A Adverse effect of glucocorticoids and synthetic analogues, initial encounter: Secondary | ICD-10-CM | POA: Insufficient documentation

## 2019-05-13 DIAGNOSIS — G43909 Migraine, unspecified, not intractable, without status migrainosus: Secondary | ICD-10-CM | POA: Insufficient documentation

## 2019-05-13 DIAGNOSIS — K219 Gastro-esophageal reflux disease without esophagitis: Secondary | ICD-10-CM | POA: Insufficient documentation

## 2019-05-13 DIAGNOSIS — Z803 Family history of malignant neoplasm of breast: Secondary | ICD-10-CM | POA: Insufficient documentation

## 2019-05-13 DIAGNOSIS — M0800 Unspecified juvenile rheumatoid arthritis of unspecified site: Secondary | ICD-10-CM | POA: Insufficient documentation

## 2019-05-13 DIAGNOSIS — Z7952 Long term (current) use of systemic steroids: Secondary | ICD-10-CM | POA: Insufficient documentation

## 2019-05-14 ENCOUNTER — Encounter: Payer: Self-pay | Admitting: Family Medicine

## 2019-05-14 DIAGNOSIS — M79672 Pain in left foot: Secondary | ICD-10-CM | POA: Diagnosis not present

## 2019-05-14 DIAGNOSIS — Z4789 Encounter for other orthopedic aftercare: Secondary | ICD-10-CM | POA: Diagnosis not present

## 2019-05-14 DIAGNOSIS — M25572 Pain in left ankle and joints of left foot: Secondary | ICD-10-CM | POA: Diagnosis not present

## 2019-05-21 MED FILL — METHOTREXATE 25 MG/ML VIAL: 50 | 90 days supply | Qty: 6 | Fill #1

## 2019-05-21 MED FILL — LEUCOVORIN CALCIUM 5 MG TAB: 5 | 90 days supply | Qty: 90 | Fill #1

## 2019-05-21 NOTE — Telephone Encounter (Signed)
Please print and abstract/scan. Thanks.

## 2019-05-22 ENCOUNTER — Encounter (HOSPITAL_COMMUNITY): Payer: Self-pay

## 2019-05-22 ENCOUNTER — Ambulatory Visit (HOSPITAL_COMMUNITY)
Admission: EM | Admit: 2019-05-22 | Discharge: 2019-05-22 | Disposition: A | Discharge status: Home / Self Care | Payer: 59

## 2019-05-22 ENCOUNTER — Other Ambulatory Visit: Payer: Self-pay

## 2019-05-22 DIAGNOSIS — S91311A Laceration without foreign body, right foot, initial encounter: Secondary | ICD-10-CM | POA: Diagnosis not present

## 2019-05-22 DIAGNOSIS — M79671 Pain in right foot: Secondary | ICD-10-CM

## 2019-05-22 MED ORDER — LIDOCAINE-EPINEPHRINE (PF) 2 %-1:200000 IJ SOLN
INTRAMUSCULAR | Status: AC
  Filled 2019-05-22: qty 20

## 2019-05-22 NOTE — ED Provider Notes (Signed)
[place]   MRN: [id number] DOB: [birth year]  Subjective:   Anne Garrett is a 44 y.o. female presenting for suffering a right foot laceration last night.  Patient accidentally dropped a knife that did a slight puncture wound to the right lateral portion of her foot.  She is kept her wound covered.  Tdap updated in 2019.  No current facility-administered medications for this encounter.  Current Outpatient Medications:  .  baricitinib (OLUMIANT) 2 MG TABS tablet, Take 1 tablet (2 mg total) by mouth daily. (Patient taking differently: Take 2 mg by mouth daily. ), Disp: 30 tablet, Rfl: 2 .  Butalbital-APAP-Caffeine 50-325-40 MG capsule, Take 1 capsule by mouth daily as needed., Disp: 30 capsule, Rfl: 0 .  Calcium Carb-Cholecalciferol (CALCIUM+D3 PO), Take 1 tablet by mouth daily., Disp: , Rfl:  .  Cholecalciferol (VITAMIN D3) 50 MCG (2000 UT) TABS, Take 2,000 Units by mouth daily., Disp: , Rfl:  .  docusate sodium (COLACE) 100 MG capsule, Take 1 capsule (100 mg total) by mouth daily as needed. (Patient not taking: Reported on 05/11/2019), Disp: 30 capsule, Rfl: 2 .  ibandronate (BONIVA) 150 MG tablet, Take 150 mg by mouth every 30 (thirty) days. Take in the morning with a full glass of water, on an empty stomach, and do not take anything else by mouth or lie down for the next 30 min., Disp: , Rfl:  .  ibuprofen (ADVIL,MOTRIN) 200 MG tablet, Take 600-800 mg by mouth 2 (two) times daily as needed for headache, mild pain or moderate pain. , Disp: , Rfl:  .  leucovorin (WELLCOVORIN) 5 MG tablet, Take 5 mg by mouth daily., Disp: , Rfl:  .  methotrexate 250 MG/10ML injection, Inject 10 mg into the skin every Saturday., Disp: , Rfl:  .  metroNIDAZOLE (METROCREAM) 0.75 % cream, Apply topically 2 (two) times daily., Disp: 45 g, Rfl: 11 .  Multiple Vitamin (MULTIVITAMIN WITH MINERALS) TABS tablet, Take 1 tablet by mouth daily., Disp: , Rfl:  .  ondansetron (ZOFRAN) 8 MG tablet, Take 8 mg by  mouth every 8 (eight) hours as needed for nausea or vomiting., Disp: , Rfl:  .  pantoprazole (PROTONIX) 40 MG tablet, Take 40 mg by mouth 2 (two) times daily., Disp: , Rfl:  .  predniSONE (DELTASONE) 5 MG tablet, Take 5 mg by mouth every evening. , Disp: , Rfl:  .  promethazine (PHENERGAN) 12.5 MG tablet, Take 1-2 tablets (12.5-25 mg total) by mouth every 8 (eight) hours as needed for nausea or vomiting. (Patient not taking: Reported on 05/11/2019), Disp: 20 tablet, Rfl: 0 .  senna (SENOKOT) 8.6 MG TABS tablet, Take 2 tablets (17.2 mg total) by mouth 2 (two) times daily. (Patient not taking: Reported on 05/11/2019), Disp: 30 tablet, Rfl: 0 .  triamcinolone cream (KENALOG) 0.1 %, Apply 1 application topically 2 (two) times daily as needed., Disp: 15 g, Rfl: 1   Allergies  Allergen Reactions  . Sulfa Antibiotics Itching and Rash    Head to toe  . Teriparatide Other (See Comments)    Severe Muscle Pain, [place]  . Valproic Acid And Related Rash    Head to toe  . Celexa [Citalopram] Diarrhea       . Leflunomide Diarrhea  . Paroxetine Hcl Diarrhea  . Zoloft [Sertraline] Diarrhea    Past Medical History:  Diagnosis Date  . Adrenal insufficiency ([place])    secondary to Prednisone  . Eczema    coner oif eye lid  .  Headache    Migraine  . Heart murmur 2012   -? heard at PCP visit 10-15 years ago-had ECHO -benign  . Intervertebral disk disease   . Juvenile idiopathic arthritis ([place])   . Osteoporosis   . Rheumatoid arthritis ([place])    Juvenile onset     Past Surgical History:  Procedure Laterality Date  . ANKLE ARTHROPLASTY Right   . ANTERIOR CERVICAL DECOMP/DISCECTOMY FUSION N/A 06/19/2015   Procedure: C3-4 C4-5 Anterior cervical decompression/diskectomy/fusion;  Surgeon: Jovita Gamma, MD;  Location: [facility];  Service: Neurosurgery;  Laterality: N/A;  C3-4 C4-5 Anterior cervical decompression/diskectomy/fusion  . APPENDECTOMY    . CARPAL TUNNEL RELEASE Right   . CESAREAN SECTION     . CHOLECYSTECTOMY    . CHOLECYSTECTOMY  2006  . COLONOSCOPY    . ESSURE TUBAL LIGATION     Essure Implant-Permanent birth control  . excision to palm Left    excision of mass to right plam  . FOOT ARTHRODESIS Left 03/01/2019   Procedure: Left talonavicular and naviculocuneiform arthrodesis;  Surgeon: [name], MD;  Location: [facility];  Service: Orthopedics;  Laterality: Left;  . HARDWARE REMOVAL Right 02/02/2017   Procedure: RIGHT WRIST DEEP IMPLANT REMOVAL, RIGHT WRIST AND HAND EXTENSOR TENDON RECONSTRUCTION, TENDON TRANSFER AND RECONSTRUCTION AS INDICATED;  Surgeon: Iran Planas, MD;  Location: [facility];  Service: Orthopedics;  Laterality: Right;  . JOINT REPLACEMENT Bilateral    bilat hip replacements  . ORIF FEMUR FRACTURE Right   . OTHER SURGICAL HISTORY  06/04/2016   Revision of PLIF  . plif l5-s1 arthrodeisis    2018  . POSTERIOR CERVICAL FUSION/FORAMINOTOMY N/A 07/12/2015   Procedure: C2 to C5 Cervical laminectomy, C2 to C5 posterior cervical arthrodesis with instrumentation and bone graft;  Surgeon: Jovita Gamma, MD;  Location: [facility];  Service: Neurosurgery;  Laterality: N/A;  C2 to C5 Cervical laminectomy, C2  to C5 posterior cervical arthrodesis with instrumentation and bone graft  . REPAIR EXTENSOR TENDON Right 02/02/2017   Procedure: REPAIR EXTENSOR TENDON;  Surgeon: Iran Planas, MD;  Location: [facility];  Service: Orthopedics;  Laterality: Right;  . REVERSE SHOULDER ARTHROPLASTY Left 09/07/2018   Procedure: REVERSE SHOULDER ARTHROPLASTY;  Surgeon: [name], MD;  Location: [facility];  Service: Orthopedics;  Laterality: Left;  . right foot surgery Right    right jfoot arthrodesis and right ankle arthroplasty  . WRIST SURGERY Bilateral    arthrodesis    Family History  Problem Relation Age of Onset  . Breast cancer Mother 49  . Nephritis Father   . Epilepsy Father   . Breast cancer Maternal Aunt        2 mat aunts   . Multiple sclerosis Maternal  Uncle   . Psoriasis Paternal Aunt   . Diabetes Daughter     Social History   Tobacco Use  . Smoking status: Former Smoker    Packs/day: 0.25    Years: 20.00    Pack years: 5.00    Types: Cigarettes    Quit date: 07/02/2015    Years since quitting: 3.8  . Smokeless tobacco: Never Used  Substance Use Topics  . Alcohol use: Yes    Alcohol/week: 2.0 standard drinks    Types: 2 Glasses of wine per week    Comment: social  . Drug use: No    ROS   Objective:   Vitals: BP 116/70 (BP Location: Right Arm)   Pulse 60   Temp 99.4 F (37.4 C) (  Oral)   Resp 16   Wt 112 lb 9.6 oz (51.1 kg)   LMP 05/09/2019   SpO2 100%   BMI 22.74 kg/m   Physical Exam Constitutional:      General: She is not in acute distress.    Appearance: Normal appearance. She is well-developed. She is not ill-appearing, toxic-appearing or diaphoretic.  HENT:     Head: Normocephalic and atraumatic.     Nose: Nose normal.     Mouth/Throat:     Mouth: Mucous membranes are moist.     Pharynx: Oropharynx is clear.  Eyes:     General: No scleral icterus.    Extraocular Movements: Extraocular movements intact.     Pupils: Pupils are equal, round, and reactive to light.  Cardiovascular:     Rate and Rhythm: Normal rate.  Pulmonary:     Effort: Pulmonary effort is normal.  Musculoskeletal:       Feet:  Skin:    General: Skin is warm and dry.  Neurological:     General: No focal deficit present.     Mental Status: She is alert and oriented to person, place, and time.  Psychiatric:        Mood and Affect: Mood normal.        Behavior: Behavior normal.        Thought Content: Thought content normal.        Judgment: Judgment normal.     PROCEDURE NOTE: laceration repair Verbal consent obtained from patient.  Local anesthesia with 2cc Lidocaine 2% with epinephrine.  Wound explored for tendon, ligament damage. Wound scrubbed with soap and water and rinsed. Wound closed with #1 5-0 Prolene (simple  interrupted) sutures.  Wound cleansed and dressed.   Assessment and Plan :   PDMP not reviewed this encounter.  1. Right foot pain   2. Foot laceration, right, initial encounter     Laceration repaired successfully. Wound care reviewed. Return-to-clinic precautions discussed, patient verbalized understanding. Otherwise, follow up in 7 to 10 days for suture removal.     Jaynee Eagles, PA-C 05/22/19 [id number]

## 2019-05-22 NOTE — ED Triage Notes (Signed)
Pt is here with a right foot laceration after a knife falling directly tip down in her foot. States it constantly bleeds. Pt has not taken anything to relieve discomfort.

## 2019-05-22 NOTE — Discharge Instructions (Signed)
WOUND CARE Please return in 10 days to have your stitches/staples removed or sooner if you have concerns.  Keep area clean and dry. Do not remove bandage, if applied.  After 12 hours, remove bandage and wash wound gently with mild soap and warm water. Reapply a new bandage after cleaning wound, if directed.  Continue daily cleansing with soap and water until stitches/staples are removed.  Do not apply any ointments or creams to the wound while stitches/staples are in place, as this may cause delayed healing.  Notify the office if you experience any of the following signs of infection: Swelling, redness, pus drainage, streaking, fever >101.0 F  Notify the office if you experience excessive bleeding that does not stop after 15-20 minutes of constant, firm pressure.

## 2019-05-23 ENCOUNTER — Other Ambulatory Visit: Payer: Self-pay | Admitting: Pharmacist

## 2019-05-23 MED ORDER — OLUMIANT 2 MG PO TABS: 2.0000 mg | ORAL_TABLET | Freq: Every day | ORAL | 3 refills | Status: DC

## 2019-05-24 MED FILL — traMADol HCL 50 MG TABS: 50 | 20 days supply | Qty: 120 | Fill #1

## 2019-05-28 MED FILL — OLUMIANT 2 MG TABS: 2 | 30 days supply | Qty: 30 | Fill #0

## 2019-06-13 DIAGNOSIS — M25511 Pain in right shoulder: Secondary | ICD-10-CM | POA: Diagnosis not present

## 2019-06-25 DIAGNOSIS — M79672 Pain in left foot: Secondary | ICD-10-CM | POA: Diagnosis not present

## 2019-06-25 DIAGNOSIS — M19072 Primary osteoarthritis, left ankle and foot: Secondary | ICD-10-CM | POA: Diagnosis not present

## 2019-06-25 MED FILL — traMADol HCL 50 MG TABS: 50 | 30 days supply | Qty: 120 | Fill #0

## 2019-06-27 MED FILL — OLUMIANT 2 MG TABS: 2 | 30 days supply | Qty: 30 | Fill #1

## 2019-07-12 ENCOUNTER — Telehealth (INDEPENDENT_AMBULATORY_CARE_PROVIDER_SITE_OTHER): Payer: Self-pay | Admitting: Pharmacist

## 2019-07-12 ENCOUNTER — Encounter: Payer: Self-pay | Admitting: Pharmacist

## 2019-07-12 DIAGNOSIS — Z5181 Encounter for therapeutic drug level monitoring: Secondary | ICD-10-CM

## 2019-07-12 NOTE — Progress Notes (Signed)
   S: Patient presents today for review of their specialty medication.   Patient is currently taking Olumiant for rheumatoid arthritis. Patient is managed by [name] for this.   Dosing: 2 mg daily  Adherence: denies any missed doses  Efficacy: reports that she thinks it is working for her. She has noticed that when she stops the medication for surgeries that she does have more pain.  Monitoring:  S/sx infection: denies S/sx malignancy: denies S/sx thrombosis: denies GI upset: denies CBC: regularly monitored by Dr. Trudie Reed' office, last one her rheumatologist didn't have any issues with. See media tab for scanned in report. Patient does report easy bruising  Current adverse effects: denies   O:     Lab Results  Component Value Date   WBC 7.3 03/05/2019   HGB 10.7 (L) 03/05/2019   HCT 33.6 (L) 03/05/2019   MCV 106.7 (H) 03/05/2019   PLT 458 (A) 04/30/2019      Chemistry      Component Value Date/Time   NA 139 04/30/2019 0000   K 4.1 04/30/2019 0000   CL 103 04/30/2019 0000   CO2 23 (A) 04/30/2019 0000   BUN 17 04/30/2019 0000   CREATININE 0.6 04/30/2019 0000   CREATININE 0.59 03/05/2019 1107   GLU 91 04/30/2019 0000      Component Value Date/Time   CALCIUM 9.0 04/30/2019 0000   ALKPHOS 63 04/30/2019 0000   AST 21 04/30/2019 0000   ALT 16 04/30/2019 0000   BILITOT 0.7 03/05/2019 1107       A/P: 1. Medication review: patient currently on Olumiant and is tolerating it well with no adverse effects. Reviewed last office visit note with rheumatology and labs (see media tab). Abnormal labs similar to those above and have been stable - patient reports that rheumatologist has not concerns with any of her labs. Reviewed the medication with her, including the following: Possible adverse effects include increased risk of infection, increased risk of malignancy, thrombosis, GI upset, and blood count abnormalities. Patient denies any questions or concerns with the  medication. Bruising could be due to Olumiant but also could be due to methotrexate due to the timeline that it started. No other s/sx of bleeding. No recommendations for any changes. Patient to follow up with rheumatology as directed.   Christella Hartigan, PharmD, BCPS, BCACP, CPP Clinical Pharmacist Practitioner

## 2019-07-18 MED FILL — predniSONE 5 MG TABS: 5 | 12 days supply | Qty: 48 | Fill #0

## 2019-07-22 ENCOUNTER — Telehealth: Payer: 59 | Admitting: Physician Assistant

## 2019-07-22 DIAGNOSIS — B370 Candidal stomatitis: Secondary | ICD-10-CM

## 2019-07-22 MED ORDER — NYSTATIN 100000 UNIT/ML MT SUSP: OROMUCOSAL | 0 refills | Status: DC

## 2019-07-22 NOTE — Progress Notes (Signed)
E-Visit for Mouth Ulcers  We are sorry that you are not feeling well.  Here is how we plan to help!  Based on what you have shared with me, it appears that you do have thrush.     The following medications should decrease the discomfort and help with healing.  I have sent in a prescription for nystatin oral solution that you have used previously.   Please continue normal oral care, if your symptoms worsen or do not improve in the next 3 days please follow up with you primary care provider for further evaluation.     Home Care:  . Avoid spicy and acidic foods . Eat soft foods and avoid rough, crunchy foods . Avoid chewing gum . Do not use toothpaste that contains sodium lauryl sulphite . Use a straw to drink which helps avoid liquids toughing the ulcers near the front of your mouth . Use a very soft toothbrush . If you have dentures or dental hardware that you feel is not fitting well or contributing to his, please see your dentist.  MAKE SURE YOU:  Understand these instructions.  Will watch your condition.  Will get help right away if you are not doing well or get worse.  Your e-visit answers were reviewed by a board certified advanced clinical practitioner to complete your personal care plan.  Depending upon the condition, your plan could have included both over the counter or prescription medications.    Please review your pharmacy choice.  Be sure that the pharmacy you have chosen is open so that you can pick up your prescription now.  If there is a problem, you can message your provider in [place] to have the prescription routed to another pharmacy.    Your safety is important to [place].  If you have drug allergies check our prescription carefully.  For the next 24 hours you can use MyChart to ask questions about today's visit, request a non-urgent call back, or ask for a work or school excuse from your e-visit provider.  You will get an email with a survey asking about your  experience and to give [place] any feedback.  I hope that your e-visit has been valuable and will speed your recovery.  Greater than 5 minutes, yet less than 10 minutes of time have been spent researching, coordinating, and implementing care for this patient today

## 2019-07-23 DIAGNOSIS — L570 Actinic keratosis: Secondary | ICD-10-CM | POA: Diagnosis not present

## 2019-07-23 DIAGNOSIS — D485 Neoplasm of uncertain behavior of skin: Secondary | ICD-10-CM | POA: Diagnosis not present

## 2019-07-23 DIAGNOSIS — D2271 Melanocytic nevi of right lower limb, including hip: Secondary | ICD-10-CM | POA: Diagnosis not present

## 2019-07-23 DIAGNOSIS — L821 Other seborrheic keratosis: Secondary | ICD-10-CM | POA: Diagnosis not present

## 2019-07-24 ENCOUNTER — Encounter: Payer: Self-pay | Admitting: Family Medicine

## 2019-07-24 MED FILL — traMADol HCL 50 MG TABS: 50 | 30 days supply | Qty: 120 | Fill #0

## 2019-07-25 ENCOUNTER — Other Ambulatory Visit: Payer: Self-pay

## 2019-07-25 MED ORDER — FLUCONAZOLE 150 MG PO TABS: 150.0000 mg | ORAL_TABLET | Freq: Once | ORAL | 0 refills | Status: AC

## 2019-07-26 MED FILL — OLUMIANT 2 MG TABS: 2 | 30 days supply | Qty: 30 | Fill #2

## 2019-07-27 MED FILL — predniSONE 5 MG TABS: 5 | 90 days supply | Qty: 180 | Fill #0

## 2019-07-30 DIAGNOSIS — Z79899 Other long term (current) drug therapy: Secondary | ICD-10-CM | POA: Diagnosis not present

## 2019-07-30 DIAGNOSIS — M2550 Pain in unspecified joint: Secondary | ICD-10-CM | POA: Diagnosis not present

## 2019-07-30 DIAGNOSIS — M810 Age-related osteoporosis without current pathological fracture: Secondary | ICD-10-CM | POA: Diagnosis not present

## 2019-07-30 DIAGNOSIS — K121 Other forms of stomatitis: Secondary | ICD-10-CM | POA: Diagnosis not present

## 2019-07-30 DIAGNOSIS — M150 Primary generalized (osteo)arthritis: Secondary | ICD-10-CM | POA: Diagnosis not present

## 2019-07-30 DIAGNOSIS — M0579 Rheumatoid arthritis with rheumatoid factor of multiple sites without organ or systems involvement: Secondary | ICD-10-CM | POA: Diagnosis not present

## 2019-07-30 DIAGNOSIS — Z6823 Body mass index (BMI) 23.0-23.9, adult: Secondary | ICD-10-CM | POA: Diagnosis not present

## 2019-07-30 LAB — CBC AND DIFFERENTIAL
HCT: 37 (ref 36–46)
Hemoglobin: 12.3 (ref 12.0–16.0)
Neutrophils Absolute: 5
Platelets: 423 — AB (ref 150–399)
WBC: 10

## 2019-07-30 LAB — COMPREHENSIVE METABOLIC PANEL
GFR calc Af Amer: 124
GFR calc non Af Amer: 108

## 2019-07-30 LAB — BASIC METABOLIC PANEL
BUN: 12 (ref 4–21)
Creatinine: 0.6 (ref 0.5–1.1)
Glucose: 70

## 2019-07-30 LAB — CBC: RBC: 3.68 — AB (ref 3.87–5.11)

## 2019-08-17 ENCOUNTER — Other Ambulatory Visit: Payer: Self-pay | Admitting: Pharmacist

## 2019-08-17 ENCOUNTER — Ambulatory Visit (HOSPITAL_BASED_OUTPATIENT_CLINIC_OR_DEPARTMENT_OTHER): Payer: 59 | Admitting: Pharmacist

## 2019-08-17 ENCOUNTER — Other Ambulatory Visit: Payer: Self-pay

## 2019-08-17 DIAGNOSIS — Z79899 Other long term (current) drug therapy: Secondary | ICD-10-CM

## 2019-08-17 MED ORDER — ACTEMRA 162 MG/0.9ML ~~LOC~~ SOSY: PREFILLED_SYRINGE | SUBCUTANEOUS | 2 refills | Status: DC

## 2019-08-17 NOTE — Progress Notes (Signed)
   S: Patient presents today for review of their specialty medication.   Patient is currently taking Actemra for rheumatoid arthritis. Patient is managed by [name] for this.   Adherence: changing from Olumiant to Actemra.   Efficacy: changing from Olumiant to Actemra. She has been on several medications previously.   Dosing:  SubQ: 162 mg once every other week; increase to 162 mg once every week based on clinical response.   Dosing adjustments: Renal impairment CrCl <30 mL/minute: no adjustments provided in package insert - has not been studied Hepatic impairment: avoid initiation and adjust dose as needed (see package insert) for hepatoxicity during treatment  Monitoring: S/sx of renal/hepatotoxicity: none S/sx of infection: none S/sx of hypersensitivity: none   Other adverse effects: none    O:   Lab Results  Component Value Date   WBC 7.3 03/05/2019   HGB 10.7 (L) 03/05/2019   HCT 33.6 (L) 03/05/2019   MCV 106.7 (H) 03/05/2019   PLT 458 (A) 04/30/2019      Chemistry      Component Value Date/Time   NA 139 04/30/2019 0000   K 4.1 04/30/2019 0000   CL 103 04/30/2019 0000   CO2 23 (A) 04/30/2019 0000   BUN 17 04/30/2019 0000   CREATININE 0.6 04/30/2019 0000   CREATININE 0.59 03/05/2019 1107   GLU 91 04/30/2019 0000      Component Value Date/Time   CALCIUM 9.0 04/30/2019 0000   ALKPHOS 63 04/30/2019 0000   AST 21 04/30/2019 0000   ALT 16 04/30/2019 0000   BILITOT 0.7 03/05/2019 1107       A/P: 1. Medication review: Patient currently prescribed Actemra for rheumatoid arthritis. Reviewed the medication with the patient, including the following: Actemra is a IL-6 antagonist used in the treatment of rheumatoid arthritis. Patient should allow medication to reach room temperature prior to administration. Do not use if particulate matter or discoloration is visible. Rotate injection sites and avoid injecting into moles, scars, or damaged skin. Possible  adverse effects include increased cholesterol, hepatotoxicity, injection site reactions, and infusion-related reactions (IV). GI perforation, hematologic effects, hypersensitivity reactions, increased risk of infections and possible increased risk of malignancy. Live vaccinations should be avoided and patient should notify physician if he or she has an infection as Actemra might need to be held. No recommendations for any changes.   Benard Halsted, PharmD, [place] [phone]

## 2019-08-20 ENCOUNTER — Other Ambulatory Visit: Payer: Self-pay | Admitting: Family Medicine

## 2019-08-20 ENCOUNTER — Telehealth: Payer: Self-pay

## 2019-08-20 MED FILL — ACTEMRA ACTPEN 162 MG/0.9ML: 162 | 28 days supply | Qty: 2 | Fill #0

## 2019-08-20 NOTE — Telephone Encounter (Signed)
LR: 05-11-2019 Qty: 30 w 0 refills  Last office visit: 05-11-2019 Upcoming appointment: No pending appt.

## 2019-08-20 NOTE — Telephone Encounter (Signed)
Unable to LVM asking patient to schedule an appointment.

## 2019-08-20 NOTE — Telephone Encounter (Signed)
Needs office visit to discuss meds. Please ask her to schedule.  (PMPP shows tramadol and oxycodone but didn't list fiorocet)

## 2019-08-20 NOTE — Telephone Encounter (Signed)
..   LAST APPOINTMENT DATE: 08/20/2019   NEXT APPOINTMENT DATE:@Visit  date not found  MEDICATION:Butalbital-APAP-Caffeine 50-325-40 MG capsule   Pt has 5 pills left

## 2019-08-21 ENCOUNTER — Encounter: Payer: Self-pay | Admitting: Family Medicine

## 2019-08-21 MED FILL — traMADol HCL 50 MG TABS: 50 | 30 days supply | Qty: 120 | Fill #0

## 2019-08-23 ENCOUNTER — Encounter: Payer: Self-pay | Admitting: Family Medicine

## 2019-08-23 ENCOUNTER — Telehealth (INDEPENDENT_AMBULATORY_CARE_PROVIDER_SITE_OTHER): Payer: 59 | Admitting: Family Medicine

## 2019-08-23 VITALS — Wt 123.0 lb

## 2019-08-23 DIAGNOSIS — M0800 Unspecified juvenile rheumatoid arthritis of unspecified site: Secondary | ICD-10-CM

## 2019-08-23 DIAGNOSIS — M48061 Spinal stenosis, lumbar region without neurogenic claudication: Secondary | ICD-10-CM

## 2019-08-23 DIAGNOSIS — S32009K Unspecified fracture of unspecified lumbar vertebra, subsequent encounter for fracture with nonunion: Secondary | ICD-10-CM | POA: Diagnosis not present

## 2019-08-23 DIAGNOSIS — G43009 Migraine without aura, not intractable, without status migrainosus: Secondary | ICD-10-CM | POA: Diagnosis not present

## 2019-08-23 DIAGNOSIS — N951 Menopausal and female climacteric states: Secondary | ICD-10-CM | POA: Diagnosis not present

## 2019-08-23 MED ORDER — RIZATRIPTAN BENZOATE 10 MG PO TBDP: 10.0000 mg | ORAL_TABLET | Freq: Every day | ORAL | 2 refills | Status: DC | PRN

## 2019-08-23 MED ORDER — BUTALBITAL-APAP-CAFFEINE 50-325-40 MG PO CAPS
1.0000 | ORAL_CAPSULE | Freq: Every day | ORAL | 0 refills | Status: DC | PRN
Start: 2019-08-23 — End: 2019-12-24

## 2019-08-23 MED FILL — RIZATRIPTAN 10 MG ODT: 10 | 30 days supply | Qty: 10 | Fill #0

## 2019-08-23 MED FILL — BUTALBIT-ACETAMINOPHEN-CAFF: 50-325-40 | 30 days supply | Qty: 30 | Fill #0

## 2019-08-23 NOTE — Progress Notes (Signed)
Virtual Visit via Video Note  Subjective  CC:  Chief Complaint  Patient presents with  . Migraine    every month for span of two weeks around menstrual cycle     I connected with [name] on 08/26/19 at  8:00 AM EDT by a video enabled telemedicine application and verified that I am speaking with the correct person using two identifiers. Location patient: Home Location provider:  [facility], Office Persons participating in the virtual visit: Xin Klawitter, [name], MD Reymundo Poll CMA  I discussed the limitations of evaluation and management by telemedicine and the availability of in person appointments. The patient expressed understanding and agreed to proceed. HPI: Anne Garrett is a 44 y.o. female who was contacted today to address the problems listed above in the chief complaint. . 44 year old with severe rheumatoid arthritis and chronic pain on chronic tramadol and planning to have right shoulder surgery in the upcoming months presents to discuss migraines.  Requesting refill of Fioricet.  Over the last 6 to 12 months has had increasing headaches, starting after her menstrual cycle and intermittently going on for about 2 weeks.  Thus she has needed more Fioricet.  Headaches are described as frontal and are associated with photophobia and nausea.  Has had long history of migraines.  Has used Imitrex and other triptan's in the past.  Used Maxalt for many years but then it seemed to stop working.  That was about 5 to 6 years ago when she started using Fioricet.  Has never had headaches that are this frequent.  She has noted an increase amount of work stressors which could be contributing.  Also hormonal changes could be playing a role.  No atypical symptoms noted.  Fioricet works well.  She has been on preventatives in the past, but did not tolerate Topamax. . RA, spinal stenosis: Requiring pain medicines daily.  Managed by  Rheum. Marland Kitchen Perimenopausal without other symptoms of menopause.  Has used birth control pills in the past.  Tolerated well.  Assessment  1. Migraine without aura and without status migrainosus, not intractable   2. Juvenile rheumatoid arthritis ([place])   3. Spinal stenosis of lumbar region, unspecified whether neurogenic claudication present   4. Lumbar pseudoarthrosis   5. Perimenopausal      Plan   Migraines: More frequent than before.  Could be combination headaches.  Recommend trying Maxalt again.  Continue Fioricet as needed.  Discussed options of preventatives and/or headache consult.  Patient will think about both.  RA and chronic pain on tramadol.  Managed per rheumatology.  Perimenopausal with headaches associated with menstrual cycles: To discuss with her GYN.  Consider starting birth control pills again to see if that helps. I discussed the assessment and treatment plan with the patient. The patient was provided an opportunity to ask questions and all were answered. The patient agreed with the plan and demonstrated an understanding of the instructions.  Visit lasted 30 minutes discussing the above. The patient was advised to call back or seek an in-person evaluation if the symptoms worsen or if the condition fails to improve as anticipated. Follow up: CPE at patient's convenience Visit date not found  Meds ordered this encounter  Medications  . rizatriptan (MAXALT-MLT) 10 MG disintegrating tablet    Sig: Take 1 tablet (10 mg total) by mouth daily as needed for migraine. May repeat in 2 hours if needed    Dispense:  10 tablet    Refill:  2  . Butalbital-APAP-Caffeine 50-325-40 MG capsule    Sig: Take 1 capsule by mouth daily as needed.    Dispense:  30 capsule    Refill:  0      I reviewed the patients updated PMH, FH, and SocHx.    Patient Active Problem List   Diagnosis Date Noted  . Juvenile rheumatoid arthritis ([place]) 05/13/2019  . Family history of breast cancer  05/13/2019  . GERD (gastroesophageal reflux disease) 05/13/2019  . Migraines 05/13/2019  . Immunosuppression due to chronic steroid use 05/13/2019  . Rosacea 05/13/2019  . 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 Lumbar pseudoarthrosis 06/04/2016  . Lumbar stenosis 04/08/2016  . S/P cervical spinal fusion 07/13/2015  . Cervical stenosis of spinal canal 07/12/2015  . HNP (herniated nucleus pulposus), cervical 06/19/2015  . VAIN I (vaginal intraepithelial neoplasia grade I) 08/24/2013  . LGSIL (low grade squamous intraepithelial dysplasia) 08/20/2013  . H/O Clostridium difficile infection 01/16/2013  . Hemangioma 01/16/2013  . Immunosuppression ([place]) 01/16/2013  . Secondary adrenal insufficiency ([place]) 01/16/2013  . Osteoporosis 12/28/2012   Current Meds  Medication Sig  . Butalbital-APAP-Caffeine 50-325-40 MG capsule Take 1 capsule by mouth daily as needed.  . Calcium Carb-Cholecalciferol (CALCIUM+D3 PO) Take 1 tablet by mouth daily.  . Cholecalciferol (VITAMIN D3) 50 MCG (2000 UT) TABS Take 2,000 Units by mouth daily.  Marland Kitchen ibuprofen (ADVIL,MOTRIN) 200 MG tablet Take 600-800 mg by mouth 2 (two) times daily as needed for headache, mild pain or moderate pain.   . metroNIDAZOLE (METROCREAM) 0.75 % cream Apply topically 2 (two) times daily.  . Multiple Vitamin (MULTIVITAMIN WITH MINERALS) TABS tablet Take 1 tablet by mouth daily.  . predniSONE (DELTASONE) 5 MG tablet Take 5 mg by mouth every evening.   . Tocilizumab (ACTEMRA) 162 MG/0.9ML SOSY Inject 0.61ml subcutaneous every other week.  . traMADol (ULTRAM) 50 MG tablet Take 50 mg by mouth every 6 (six) hours as needed. Two 50 mg tablets morning and nightly  . triamcinolone cream (KENALOG) 0.1 % Apply 1 application topically 2 (two) times daily as needed.  . [DISCONTINUED] Butalbital-APAP-Caffeine 50-325-40 MG capsule Take 1 capsule by mouth daily as needed.    Allergies: Patient is allergic to sulfa antibiotics,  teriparatide, valproic acid and related, celexa [citalopram], leflunomide, paroxetine hcl, and zoloft [sertraline]. Family History: Patient family history includes Breast cancer in her maternal aunt; Breast cancer (age of onset: 18) in her mother; Diabetes in her daughter; Epilepsy in her father; Multiple sclerosis in her maternal uncle; Nephritis in her father; Psoriasis in her paternal aunt. Social History:  Patient  reports that she quit smoking about 4 years ago. Her smoking use included cigarettes. She has a 5.00 pack-year smoking history. She has never used smokeless tobacco. She reports current alcohol use of about 2.0 standard drinks of alcohol per week. She reports that she does not use drugs.  Review of Systems: Constitutional: Negative for fever malaise or anorexia Cardiovascular: negative for chest pain Respiratory: negative for SOB or persistent cough Gastrointestinal: negative for abdominal pain  OBJECTIVE Vitals: Wt 123 lb (55.8 kg)   BMI 24.84 kg/m  General: no acute distress , A&Ox3  [name], MD

## 2019-08-26 ENCOUNTER — Encounter: Payer: Self-pay | Admitting: Family Medicine

## 2019-08-29 DIAGNOSIS — Z23 Encounter for immunization: Secondary | ICD-10-CM | POA: Diagnosis not present

## 2019-08-30 ENCOUNTER — Ambulatory Visit: Payer: Self-pay

## 2019-08-31 ENCOUNTER — Encounter: Payer: Self-pay | Admitting: Family Medicine

## 2019-08-31 NOTE — Telephone Encounter (Signed)
Pictures

## 2019-08-31 NOTE — Telephone Encounter (Signed)
Pt send information and pictures

## 2019-09-03 ENCOUNTER — Encounter: Payer: Self-pay | Admitting: Family Medicine

## 2019-09-03 ENCOUNTER — Other Ambulatory Visit: Payer: Self-pay

## 2019-09-03 ENCOUNTER — Ambulatory Visit (INDEPENDENT_AMBULATORY_CARE_PROVIDER_SITE_OTHER): Payer: 59 | Admitting: Family Medicine

## 2019-09-03 VITALS — BP 128/64 | HR 58 | Temp 98.3°F | Resp 18 | Ht 59.0 in | Wt 123.0 lb

## 2019-09-03 DIAGNOSIS — M0800 Unspecified juvenile rheumatoid arthritis of unspecified site: Secondary | ICD-10-CM | POA: Diagnosis not present

## 2019-09-03 DIAGNOSIS — D849 Immunodeficiency, unspecified: Secondary | ICD-10-CM | POA: Diagnosis not present

## 2019-09-03 DIAGNOSIS — L271 Localized skin eruption due to drugs and medicaments taken internally: Secondary | ICD-10-CM | POA: Diagnosis not present

## 2019-09-03 DIAGNOSIS — L719 Rosacea, unspecified: Secondary | ICD-10-CM | POA: Diagnosis not present

## 2019-09-03 DIAGNOSIS — L659 Nonscarring hair loss, unspecified: Secondary | ICD-10-CM

## 2019-09-03 LAB — B12 AND FOLATE PANEL
Folate: >24 ng/mL
Vitamin B-12: 665 pg/mL (ref 200–1100)

## 2019-09-03 LAB — TSH: TSH: 0.72 mIU/L

## 2019-09-03 NOTE — Patient Instructions (Signed)
Please return for your complete physical at your convenience.   I have ordered a few labs to ensure your thyroid and vitamin levels are ok. I have reviewed your recent lab results and they are normal.   Please schedule with your dermatologist.   If you have any questions or concerns, please don't hesitate to send me a message via MyChart or call the office at [phone]. Thank you for visiting with [place] today! It's our pleasure caring for you.

## 2019-09-03 NOTE — Progress Notes (Signed)
Subjective  CC:  Chief Complaint  Patient presents with  . Alopecia    For the last year. She has a dermatologist that she sees but she is not happy with the treatment.   . Rash    Has been happening on and off for the last 2 years but here recently it has been coming up more frequently. Used some cream for roscea. Some burning and itching that comes and goes.     HPI: Anne Garrett is a 44 y.o. female who presents to the office today to address the problems listed above in the chief complaint.  Anne Garrett complains of hair thinning, coming on patches again, flaring rosacea, tingling in her scalp, and recent muscle pain that is resolved since stopping methotrexate.  I reviewed her recent lab work from rheumatology.  Normal blood count, sed rate 2.  She is very concerned that something is going on to tie all these things in.  She is distraught.  She denies fevers, chills, persistent rashes.  See her recent MyChart note documenting the external eruption with Diflucan.  Rheumatoid arthritis is also flaring.  She has been on prednisone for 40 years and has had some hair thinning and nail changes.  She is concerned that something else is causing her problems.  Assessment  1. Hair loss   2. Immunosuppression ([place])   3. Rosacea   4. Juvenile rheumatoid arthritis ([place])   5. Fixed drug eruption      Plan   Today's visit was 30 minutes long. Greater than 50% of this time was devoted to face to face counseling with the patient and coordination of care. We discussed her diagnosis, prognosis, treatment options and treatment plan is documented below.   Current symptoms are hair thinning, formally diagnosis telogen effluvium, and flare of rosacea noted.  No other red flag or worrisome symptoms are present.  Recommend follow-up with dermatology to answer some of her questions and to work on treatment options.  Chronic skin changes can be noted with chronic prednisone use.  Education given.  Discussed  possible fixed drug eruption with Diflucan.  Will check lab work including thyroid and vitamin levels.  Reviewed recent CBC CMP and normal sed rate.  Follow up: Complete physical   Orders Placed This Encounter  Procedures  . TSH  . B12 and Folate Panel   No orders of the defined types were placed in this encounter.     We updated and reviewed the patient's past history in detail and it is documented below.  Patient Active Problem List   Diagnosis Date Noted  . Juvenile rheumatoid arthritis ([place]) 05/13/2019  . Family history of breast cancer 05/13/2019  . GERD (gastroesophageal reflux disease) 05/13/2019  . Migraines 05/13/2019  . Immunosuppression due to chronic steroid use 05/13/2019  . Rosacea 05/13/2019  . 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 Lumbar pseudoarthrosis 06/04/2016  . Lumbar stenosis 04/08/2016  . S/P cervical spinal fusion 07/13/2015  . Cervical stenosis of spinal canal 07/12/2015  . HNP (herniated nucleus pulposus), cervical 06/19/2015  . VAIN I (vaginal intraepithelial neoplasia grade I) 08/24/2013    Formatting of this note might be different from the original. On biopsy Aug 2015   . LGSIL (low grade squamous intraepithelial dysplasia) 08/20/2013    Formatting of this note might be different from the original. On pap smear June 2015.   HR HPV positive.  Colposcopy and cervical AND vaginal biopsies done Aug 2015   .  H/O Clostridium difficile infection 01/16/2013    Formatting of this note might be different from the original. 2013 and 2014 (vaginal cleocin triggered the second one)   . Hemangioma 01/16/2013  . Immunosuppression ([place]) 01/16/2013     Due to chronic prednisone therapy   . Secondary adrenal insufficiency ([place]) 01/16/2013    Formatting of this note might be different from the original. Prednisone therapy   . Osteoporosis 12/28/2012    Formatting of this note might be different from the original. Per patient,  attributed to chronic steroids    Health Maintenance  Topic Date Due  . Hepatitis C Screening  Never done  . HIV Screening  Never done  . INFLUENZA VACCINE  08/05/2019  . PAP SMEAR-Modifier  01/04/2022  . TETANUS/TDAP  01/12/2027  . COVID-19 Vaccine  Completed   Immunization History  Administered Date(s) Administered  . HPV 9-valent 12/06/2016, 03/28/2017, 07/22/2017  . Hepatitis B 01/05/2003  . Influenza Inj Mdck Quad With Preservative 10/10/2017, 10/05/2018  . Influenza,inj,Quad PF,6+ Mos 09/11/2013  . Influenza-Unspecified 09/04/2013, 10/10/2014, 10/10/2015, 10/05/2016, 10/09/2017, 10/17/2018  . PFIZER SARS-COV-2 Vaccination 01/23/2019, 02/16/2019  . Tdap 01/04/2006, 01/11/2017   Current Meds  Medication Sig  . Butalbital-APAP-Caffeine 50-325-40 MG capsule Take 1 capsule by mouth daily as needed.  . Calcium Carb-Cholecalciferol (CALCIUM+D3 PO) Take 1 tablet by mouth daily.  . Cholecalciferol (VITAMIN D3) 50 MCG (2000 UT) TABS Take 2,000 Units by mouth daily.  Marland Kitchen docusate sodium (COLACE) 100 MG capsule Take 1 capsule (100 mg total) by mouth daily as needed.  Marland Kitchen ibuprofen (ADVIL,MOTRIN) 200 MG tablet Take 600-800 mg by mouth 2 (two) times daily as needed for headache, mild pain or moderate pain.   . metroNIDAZOLE (METROCREAM) 0.75 % cream Apply topically 2 (two) times daily.  . Multiple Vitamin (MULTIVITAMIN WITH MINERALS) TABS tablet Take 1 tablet by mouth daily.  . ondansetron (ZOFRAN) 8 MG tablet Take 8 mg by mouth every 8 (eight) hours as needed for nausea or vomiting.   . pantoprazole (PROTONIX) 40 MG tablet Take 40 mg by mouth 2 (two) times daily.   . predniSONE (DELTASONE) 5 MG tablet Take 5 mg by mouth every evening.   . promethazine (PHENERGAN) 12.5 MG tablet Take 1-2 tablets (12.5-25 mg total) by mouth every 8 (eight) hours as needed for nausea or vomiting.  . rizatriptan (MAXALT-MLT) 10 MG disintegrating tablet Take 1 tablet (10 mg total) by mouth daily as needed for  migraine. May repeat in 2 hours if needed  . senna (SENOKOT) 8.6 MG TABS tablet Take 2 tablets (17.2 mg total) by mouth 2 (two) times daily.  . Tocilizumab (ACTEMRA) 162 MG/0.9ML SOSY Inject 0.80ml subcutaneous every other week.  . traMADol (ULTRAM) 50 MG tablet Take 50 mg by mouth every 6 (six) hours as needed. Two 50 mg tablets morning and nightly  . triamcinolone cream (KENALOG) 0.1 % Apply 1 application topically 2 (two) times daily as needed.    Allergies: Patient is allergic to sulfa antibiotics, teriparatide, valproic acid and related, celexa [citalopram], diflucan [fluconazole], leflunomide, paroxetine hcl, and zoloft [sertraline]. Past Medical History Patient  has a past medical history of Adrenal insufficiency ([place]), Eczema, Headache, Heart murmur (2012), Intervertebral disk disease, Juvenile idiopathic arthritis ([place]), Osteoporosis, and Rheumatoid arthritis ([place]). Past Surgical History Patient  has a past surgical history that includes Appendectomy; Cholecystectomy; Essure tubal ligation; Joint replacement (Bilateral); Carpal tunnel release (Right); Cesarean section; Ankle arthroplasty (Right); ORIF femur fracture (Right); Wrist surgery (Bilateral); excision to palm (Left);  right foot surgery (Right); Anterior cervical decomp/discectomy fusion (N/A, 06/19/2015); Colonoscopy; Posterior cervical fusion/foraminotomy (N/A, 07/12/2015); plif l5-s1 arthrodeisis   (2018); OTHER SURGICAL HISTORY (06/04/2016); Hardware Removal (Right, 02/02/2017); Repair extensor tendon (Right, 02/02/2017); Reverse shoulder arthroplasty (Left, 09/07/2018); Foot arthrodesis (Left, 03/01/2019); and Cholecystectomy (2006). Family History: Patient family history includes Breast cancer in her maternal aunt; Breast cancer (age of onset: 10) in her mother; Diabetes in her daughter; Epilepsy in her father; Multiple sclerosis in her maternal uncle; Nephritis in her father; Psoriasis in her paternal aunt. Social History:  Patient   reports that she quit smoking about 4 years ago. Her smoking use included cigarettes. She has a 5.00 pack-year smoking history. She has never used smokeless tobacco. She reports current alcohol use of about 2.0 standard drinks of alcohol per week. She reports that she does not use drugs.  Review of Systems: Constitutional: negative for fever or malaise Ophthalmic: negative for photophobia, double vision or loss of vision Cardiovascular: negative for chest pain, dyspnea on exertion, or new LE swelling Respiratory: negative for SOB or persistent cough Gastrointestinal: negative for abdominal pain, change in bowel habits or melena Genitourinary: negative for dysuria or gross hematuria Musculoskeletal: negative for new gait disturbance or muscular weakness Integumentary: negative for new or persistent rashes Neurological: negative for TIA or stroke symptoms Psychiatric: negative for SI or delusions Allergic/Immunologic: negative for hives  Patient Care Team    Relationship Specialty Notifications Start End  [name], MD PCP - General Family Medicine  05/11/19   Pa, [facility]  Optometry  05/11/19   [name], MD Consulting Physician Rheumatology  05/13/19   [name], MD Consulting Physician Orthopedic Surgery  05/13/19   [name], MD Consulting Physician Orthopedic Surgery  05/13/19   [name], MD Consulting Physician Orthopedic Surgery  05/13/19   [name], DO Consulting Physician Obstetrics and Gynecology  05/13/19     Objective  Vitals: BP 128/64   Pulse (!) 58   Temp 98.3 F (36.8 C) (Temporal)   Resp 18   Ht 4\' 11"  (1.499 m)   Wt 123 lb (55.8 kg)   SpO2 99%   BMI 24.84 kg/m  General: Tearful, anxious, A&Ox3 HEENT: [place]ER[place], erythematous rash on cheeks present Joints, chronic changes noted.    Commons side effects, risks, benefits, and alternatives for medications and treatment plan prescribed today were discussed, and the patient expressed  understanding of the given instructions. Patient is instructed to call or message via MyChart if he/she has any questions or concerns regarding our treatment plan. No barriers to understanding were identified. We discussed Red Flag symptoms and signs in detail. Patient expressed understanding regarding what to do in case of urgent or emergency type symptoms.   Medication list was reconciled, printed and provided to the patient in AVS. Patient instructions and summary information was reviewed with the patient as documented in the AVS. This note was prepared with assistance of Dragon voice recognition software. Occasional wrong-word or sound-a-like substitutions may have occurred due to the inherent limitations of voice recognition software  This visit occurred during the SARS-CoV-2 public health emergency.  Safety protocols were in place, including screening questions prior to the visit, additional usage of staff PPE, and extensive cleaning of exam room while observing appropriate contact time as indicated for disinfecting solutions.

## 2019-09-07 DIAGNOSIS — L718 Other rosacea: Secondary | ICD-10-CM | POA: Diagnosis not present

## 2019-09-07 DIAGNOSIS — D485 Neoplasm of uncertain behavior of skin: Secondary | ICD-10-CM | POA: Diagnosis not present

## 2019-09-07 DIAGNOSIS — L270 Generalized skin eruption due to drugs and medicaments taken internally: Secondary | ICD-10-CM | POA: Diagnosis not present

## 2019-09-07 DIAGNOSIS — L659 Nonscarring hair loss, unspecified: Secondary | ICD-10-CM | POA: Diagnosis not present

## 2019-09-11 NOTE — Progress Notes (Signed)
DUE TO COVID-19 ONLY ONE VISITOR IS ALLOWED TO COME WITH YOU AND STAY IN THE WAITING ROOM ONLY DURING PRE OP AND PROCEDURE DAY OF SURGERY. THE 1 VISITOR  MAY VISIT WITH YOU AFTER SURGERY IN YOUR PRIVATE ROOM DURING VISITING HOURS ONLY!  YOU NEED TO HAVE A COVID 19 TEST ON__9/13/21 _____ @_______ , THIS TEST MUST BE DONE BEFORE SURGERY,  COVID TESTING SITE [address] [place] [address], IT IS ON THE RIGHT GOING OUT [address] APPROXIMATELY  2 MINUTES PAST [employer] ON THE RIGHT. ONCE YOUR COVID TEST IS COMPLETED,  PLEASE BEGIN THE QUARANTINE INSTRUCTIONS AS OUTLINED IN YOUR HANDOUT.                Anne Garrett  09/11/2019   Your procedure is scheduled on: 09/20/2019    Report to [facility] Main  Entrance   Report to admitting at   0530 AM     Call this number if you have problems the morning of surgery [phone]    REMEMBER: NO  SOLID FOOD CANDY OR GUM AFTER MIDNIGHT. CLEAR LIQUIDS UNTIL     0430am       . NOTHING BY MOUTH EXCEPT CLEAR LIQUIDS UNTIL    . PLEASE FINISH ENSURE DRINK PER SURGEON ORDER  WHICH NEEDS TO BE COMPLETED AT    0430am  .      CLEAR LIQUID DIET   Foods Allowed                                                                    Coffee and tea, regular and decaf                            Fruit ices (not with fruit pulp)                                      Iced Popsicles                                    Carbonated beverages, regular and diet                                    Cranberry, grape and apple juices Sports drinks like Gatorade Lightly seasoned clear broth or consume(fat free) Sugar, honey syrup ___________________________________________________________________      BRUSH YOUR TEETH MORNING OF SURGERY AND RINSE YOUR MOUTH OUT, NO CHEWING GUM CANDY OR MINTS.     Take these medicines the morning of surgery with A SIP OF WATER: none                                  You may not have any metal on your  body including hair pins and              piercings  Do not wear jewelry, make-up, lotions, powders or perfumes, deodorant  Do not wear nail polish on your fingernails.  Do not shave  48 hours prior to surgery.              Men may shave face and neck.   Do not bring valuables to the hospital. [place].  Contacts, dentures or bridgework may not be worn into surgery.  Leave suitcase in the car. After surgery it may be brought to your room.     Patients discharged the day of surgery will not be allowed to drive home. IF YOU ARE HAVING SURGERY AND GOING HOME THE SAME DAY, YOU MUST HAVE AN ADULT TO DRIVE YOU HOME AND BE WITH YOU FOR 24 HOURS. YOU MAY GO HOME BY TAXI OR UBER OR ORTHERWISE, BUT AN ADULT MUST ACCOMPANY YOU HOME AND STAY WITH YOU FOR 24 HOURS.  Name and phone number of your driver:                Please read over the following fact sheets you were given: _____________________________________________________________________  [facility] - Preparing for Surgery Before surgery, you can play an important role.  Because skin is not sterile, your skin needs to be as free of germs as possible.  You can reduce the number of germs on your skin by washing with CHG (chlorahexidine gluconate) soap before surgery.  CHG is an antiseptic cleaner which kills germs and bonds with the skin to continue killing germs even after washing. Please DO NOT use if you have an allergy to CHG or antibacterial soaps.  If your skin becomes reddened/irritated stop using the CHG and inform your nurse when you arrive at Short Stay. Do not shave (including legs and underarms) for at least 48 hours prior to the first CHG shower.  You may shave your face/neck. Please follow these instructions carefully:  1.  Shower with CHG Soap the night before surgery and the  morning of Surgery.  2.  If you choose to wash your hair, wash your hair first as usual with your   normal  shampoo.  3.  After you shampoo, rinse your hair and body thoroughly to remove the  shampoo.                           4.  Use CHG as you would any other liquid soap.  You can apply chg directly  to the skin and wash                       Gently with a scrungie or clean washcloth.  5.  Apply the CHG Soap to your body ONLY FROM THE NECK DOWN.   Do not use on face/ open                           Wound or open sores. Avoid contact with eyes, ears mouth and genitals (private parts).                       Wash face,  Genitals (private parts) with your normal soap.             6.  Wash thoroughly, paying special attention to the area where your surgery  will be performed.  7.  Thoroughly rinse your body with warm water  from the neck down.  8.  DO NOT shower/wash with your normal soap after using and rinsing off  the CHG Soap.                9.  Pat yourself dry with a clean towel.            10.  Wear clean pajamas.            11.  Place clean sheets on your bed the night of your first shower and do not  sleep with pets. Day of Surgery : Do not apply any lotions/deodorants the morning of surgery.  Please wear clean clothes to the hospital/surgery center.  FAILURE TO FOLLOW THESE INSTRUCTIONS MAY RESULT IN THE CANCELLATION OF YOUR SURGERY PATIENT SIGNATURE_________________________________  NURSE SIGNATURE__________________________________  ________________________________________________________________________             [facility]- Preparing for Total Shoulder Arthroplasty    Before surgery, you can play an important role. Because skin is not sterile, your skin needs to be as free of germs as possible. You can reduce the number of germs on your skin by using the following products. . Benzoyl Peroxide Gel o Reduces the number of germs present on the skin o Applied twice a day to shoulder area starting two days before surgery     ==================================================================  Please follow these instructions carefully:  BENZOYL PEROXIDE 5% GEL  Please do not use if you have an allergy to benzoyl peroxide.   If your skin becomes reddened/irritated stop using the benzoyl peroxide.  Starting two days before surgery, apply as follows: 1. Apply benzoyl peroxide in the morning and at night. Apply after taking a shower. If you are not taking a shower clean entire shoulder front, back, and side along with the armpit with a clean wet washcloth.  2. Place a quarter-sized dollop on your shoulder and rub in thoroughly, making sure to cover the front, back, and side of your shoulder, along with the armpit.   2 days before ____ AM   ____ PM              1 day before ____ AM   ____ PM                         3. Do this twice a day for two days.  (Last application is the night before surgery, AFTER using the CHG soap as described below).  4. Do NOT apply benzoyl peroxide gel on the day of surgery.

## 2019-09-13 ENCOUNTER — Encounter (HOSPITAL_COMMUNITY)
Admission: RE | Admit: 2019-09-13 | Discharge: 2019-09-13 | Disposition: A | Discharge status: Home / Self Care | Payer: 59 | Source: Ambulatory Visit | Attending: Orthopedic Surgery | Admitting: Orthopedic Surgery

## 2019-09-13 ENCOUNTER — Other Ambulatory Visit: Payer: Self-pay

## 2019-09-13 ENCOUNTER — Encounter (HOSPITAL_COMMUNITY): Payer: Self-pay

## 2019-09-13 DIAGNOSIS — Z01812 Encounter for preprocedural laboratory examination: Secondary | ICD-10-CM | POA: Insufficient documentation

## 2019-09-13 LAB — CBC
HCT: 40.4 % (ref 36.0–46.0)
Hemoglobin: 13.3 g/dL (ref 12.0–15.0)
MCH: 33.9 pg (ref 26.0–34.0)
MCHC: 32.9 g/dL (ref 30.0–36.0)
MCV: 103.1 fL — ABNORMAL HIGH (ref 80.0–100.0)
Platelets: 270 10*3/uL (ref 150–400)
RBC: 3.92 MIL/uL (ref 3.87–5.11)
RDW: 13.2 % (ref 11.5–15.5)
WBC: 6.5 10*3/uL (ref 4.0–10.5)
nRBC: 0 % (ref 0.0–0.2)

## 2019-09-13 LAB — BASIC METABOLIC PANEL
Anion gap: 8 (ref 5–15)
BUN: 6 mg/dL (ref 6–20)
CO2: 22 mmol/L (ref 22–32)
Calcium: 8.8 mg/dL — ABNORMAL LOW (ref 8.9–10.3)
Chloride: 111 mmol/L (ref 98–111)
Creatinine, Ser: 0.65 mg/dL (ref 0.44–1.00)
GFR calc Af Amer: >60 mL/min (ref >60)
GFR calc non Af Amer: >60 mL/min (ref >60)
Glucose, Bld: 69 mg/dL — ABNORMAL LOW (ref 70–99)
Potassium: 4.4 mmol/L (ref 3.5–5.1)
Sodium: 141 mmol/L (ref 135–145)

## 2019-09-13 LAB — SURGICAL PCR SCREEN
MRSA, PCR: NEGATIVE
Staphylococcus aureus: NEGATIVE

## 2019-09-13 NOTE — Progress Notes (Addendum)
Anesthesia Review:  PCP: Camille andy LOV- 09/03/19  Cardiologist : Chest x-ray : EKG :03/05/19 Echo : Stress test: Cardiac Cath :  Activity level: if it were not for RA could do flight of steps without difficulty no Sleep Study/ CPAP : Fasting Blood Sugar :      / Checks Blood Sugar -- times a day:   Blood Thinner/ Instructions /Last Dose: ASA / Instructions/ Last Dose :  Initial temp at preop was 99.8 and heart rate 99. Pt denies any Covid symptoms.    Recheck of temp and heart rate at end of preop was 100.3 and heart rate 86 on 09/13/19.,  Notified Roanna Banning via phone and informeed .  Patient states she always has temp of 99-100 related to RA and menopause.  Roanna Banning stated for pt to monitor temp at home and any symptoms.  Patient informed and voiced understanding.  Pt to inform surgeon of any changes in condition.  Spoke with Claiborne Billings, [place] at office and made her aware of above.

## 2019-09-17 ENCOUNTER — Other Ambulatory Visit (HOSPITAL_COMMUNITY)
Admission: RE | Admit: 2019-09-17 | Discharge: 2019-09-17 | Disposition: A | Discharge status: Home / Self Care | Payer: 59 | Source: Ambulatory Visit | Attending: Orthopedic Surgery | Admitting: Orthopedic Surgery

## 2019-09-17 DIAGNOSIS — Z20822 Contact with and (suspected) exposure to covid-19: Secondary | ICD-10-CM | POA: Insufficient documentation

## 2019-09-17 DIAGNOSIS — Z01812 Encounter for preprocedural laboratory examination: Secondary | ICD-10-CM | POA: Diagnosis not present

## 2019-09-17 DIAGNOSIS — L649 Androgenic alopecia, unspecified: Secondary | ICD-10-CM | POA: Diagnosis not present

## 2019-09-17 LAB — SARS CORONAVIRUS 2 (TAT 6-24 HRS): SARS Coronavirus 2: NEGATIVE

## 2019-09-19 NOTE — Anesthesia Preprocedure Evaluation (Addendum)
Anesthesia Evaluation  Patient identified by MRN, date of birth, ID band Patient awake    Reviewed: Allergy & Precautions, NPO status , Patient's Chart, lab work & pertinent test results  History of Anesthesia Complications Negative for: history of anesthetic complications  Airway Mallampati: III  TM Distance: >3 FB Neck ROM: Limited    Dental no notable dental hx. (+) Dental Advisory Given   Pulmonary neg pulmonary ROS, former smoker,    Pulmonary exam normal        Cardiovascular negative cardio ROS Normal cardiovascular exam     Neuro/Psych negative neurological ROS  negative psych ROS   GI/Hepatic Neg liver ROS, GERD  ,  Endo/Other  Adrenal insufficiency 2/2 chronic prednisone use  Renal/GU negative Renal ROS  negative genitourinary   Musculoskeletal  (+) Arthritis , Rheumatoid disorders,    Abdominal   Peds  Hematology negative hematology ROS (+)   Anesthesia Other Findings   Reproductive/Obstetrics                            Anesthesia Physical  Anesthesia Plan  ASA: II  Anesthesia Plan: General   Post-op Pain Management: GA combined w/ Regional for post-op pain   Induction: Intravenous  PONV Risk Score and Plan: 3 and Ondansetron, Dexamethasone, Midazolam and Treatment may vary due to age or medical condition  Airway Management Planned: Oral ETT  Additional Equipment: None  Intra-op Plan:   Post-operative Plan: Extubation in OR  Informed Consent: I have reviewed the patients History and Physical, chart, labs and discussed the procedure including the risks, benefits and alternatives for the proposed anesthesia with the patient or authorized representative who has indicated his/her understanding and acceptance.     Dental advisory given  Plan Discussed with: Anesthesiologist and CRNA  Anesthesia Plan Comments: (Stress dose steroids for chronic prednisone use)        Anesthesia Quick Evaluation

## 2019-09-20 ENCOUNTER — Encounter (HOSPITAL_COMMUNITY): Payer: Self-pay | Admitting: Orthopedic Surgery

## 2019-09-20 ENCOUNTER — Ambulatory Visit (HOSPITAL_COMMUNITY): Payer: 59 | Admitting: Physician Assistant

## 2019-09-20 ENCOUNTER — Ambulatory Visit (HOSPITAL_COMMUNITY)
Admission: RE | Admit: 2019-09-20 | Discharge: 2019-09-20 | Disposition: A | Discharge status: Home / Self Care | Payer: 59 | Attending: Orthopedic Surgery | Admitting: Orthopedic Surgery

## 2019-09-20 ENCOUNTER — Encounter (HOSPITAL_COMMUNITY)
Admission: RE | Disposition: A | Discharge status: Home / Self Care | Payer: Self-pay | Source: Home / Self Care | Attending: Orthopedic Surgery

## 2019-09-20 ENCOUNTER — Ambulatory Visit (HOSPITAL_COMMUNITY): Payer: 59 | Admitting: Certified Registered"

## 2019-09-20 ENCOUNTER — Other Ambulatory Visit: Payer: Self-pay

## 2019-09-20 DIAGNOSIS — M810 Age-related osteoporosis without current pathological fracture: Secondary | ICD-10-CM | POA: Insufficient documentation

## 2019-09-20 DIAGNOSIS — M069 Rheumatoid arthritis, unspecified: Secondary | ICD-10-CM | POA: Insufficient documentation

## 2019-09-20 DIAGNOSIS — Z87891 Personal history of nicotine dependence: Secondary | ICD-10-CM | POA: Diagnosis not present

## 2019-09-20 DIAGNOSIS — Z96643 Presence of artificial hip joint, bilateral: Secondary | ICD-10-CM | POA: Diagnosis not present

## 2019-09-20 DIAGNOSIS — Z9049 Acquired absence of other specified parts of digestive tract: Secondary | ICD-10-CM | POA: Diagnosis not present

## 2019-09-20 DIAGNOSIS — G8929 Other chronic pain: Secondary | ICD-10-CM | POA: Insufficient documentation

## 2019-09-20 DIAGNOSIS — Z841 Family history of disorders of kidney and ureter: Secondary | ICD-10-CM | POA: Diagnosis not present

## 2019-09-20 DIAGNOSIS — K219 Gastro-esophageal reflux disease without esophagitis: Secondary | ICD-10-CM | POA: Diagnosis not present

## 2019-09-20 DIAGNOSIS — Z981 Arthrodesis status: Secondary | ICD-10-CM | POA: Insufficient documentation

## 2019-09-20 DIAGNOSIS — M19011 Primary osteoarthritis, right shoulder: Secondary | ICD-10-CM | POA: Insufficient documentation

## 2019-09-20 DIAGNOSIS — G8918 Other acute postprocedural pain: Secondary | ICD-10-CM | POA: Diagnosis not present

## 2019-09-20 DIAGNOSIS — Z840 Family history of diseases of the skin and subcutaneous tissue: Secondary | ICD-10-CM | POA: Insufficient documentation

## 2019-09-20 DIAGNOSIS — Z7952 Long term (current) use of systemic steroids: Secondary | ICD-10-CM | POA: Insufficient documentation

## 2019-09-20 DIAGNOSIS — Z803 Family history of malignant neoplasm of breast: Secondary | ICD-10-CM | POA: Diagnosis not present

## 2019-09-20 DIAGNOSIS — T380X5A Adverse effect of glucocorticoids and synthetic analogues, initial encounter: Secondary | ICD-10-CM | POA: Insufficient documentation

## 2019-09-20 DIAGNOSIS — Z79899 Other long term (current) drug therapy: Secondary | ICD-10-CM | POA: Insufficient documentation

## 2019-09-20 DIAGNOSIS — E273 Drug-induced adrenocortical insufficiency: Secondary | ICD-10-CM | POA: Diagnosis not present

## 2019-09-20 DIAGNOSIS — Z791 Long term (current) use of non-steroidal anti-inflammatories (NSAID): Secondary | ICD-10-CM | POA: Insufficient documentation

## 2019-09-20 DIAGNOSIS — Z820 Family history of epilepsy and other diseases of the nervous system: Secondary | ICD-10-CM | POA: Insufficient documentation

## 2019-09-20 DIAGNOSIS — Z96612 Presence of left artificial shoulder joint: Secondary | ICD-10-CM | POA: Insufficient documentation

## 2019-09-20 DIAGNOSIS — G43909 Migraine, unspecified, not intractable, without status migrainosus: Secondary | ICD-10-CM | POA: Diagnosis not present

## 2019-09-20 DIAGNOSIS — Z833 Family history of diabetes mellitus: Secondary | ICD-10-CM | POA: Diagnosis not present

## 2019-09-20 HISTORY — PX: REVERSE SHOULDER ARTHROPLASTY: SHX5054

## 2019-09-20 SURGERY — ARTHROPLASTY, SHOULDER, TOTAL, REVERSE
Anesthesia: General | Site: Shoulder | Laterality: Right

## 2019-09-20 MED ORDER — ONDANSETRON HCL 4 MG/2ML IJ SOLN
INTRAMUSCULAR | Status: DC | PRN
  Administered 2019-09-20: 4 mg via INTRAVENOUS

## 2019-09-20 MED ORDER — LIDOCAINE 2% (20 MG/ML) 5 ML SYRINGE
INTRAMUSCULAR | Status: AC
  Filled 2019-09-20: qty 5

## 2019-09-20 MED ORDER — EPHEDRINE SULFATE-NACL 50-0.9 MG/10ML-% IV SOSY
PREFILLED_SYRINGE | INTRAVENOUS | Status: DC | PRN
  Administered 2019-09-20: 10 mg via INTRAVENOUS
  Administered 2019-09-20 (×2): 5 mg via INTRAVENOUS

## 2019-09-20 MED ORDER — DEXAMETHASONE SODIUM PHOSPHATE 10 MG/ML IJ SOLN
INTRAMUSCULAR | Status: AC
  Filled 2019-09-20: qty 1

## 2019-09-20 MED ORDER — FENTANYL CITRATE (PF) 100 MCG/2ML IJ SOLN
INTRAMUSCULAR | Status: DC | PRN
  Administered 2019-09-20 (×2): 50 ug via INTRAVENOUS

## 2019-09-20 MED ORDER — EPHEDRINE 5 MG/ML INJ
INTRAVENOUS | Status: AC
  Filled 2019-09-20: qty 10

## 2019-09-20 MED ORDER — ALBUMIN HUMAN 5 % IV SOLN: INTRAVENOUS | Status: DC | PRN

## 2019-09-20 MED ORDER — ESMOLOL HCL 100 MG/10ML IV SOLN
INTRAVENOUS | Status: DC | PRN
  Administered 2019-09-20: 20 mg via INTRAVENOUS

## 2019-09-20 MED ORDER — PHENYLEPHRINE HCL (PRESSORS) 10 MG/ML IV SOLN
INTRAVENOUS | Status: AC
  Filled 2019-09-20: qty 1

## 2019-09-20 MED ORDER — PROPOFOL 10 MG/ML IV BOLUS
INTRAVENOUS | Status: DC | PRN
  Administered 2019-09-20: 150 mg via INTRAVENOUS

## 2019-09-20 MED ORDER — SCOPOLAMINE 1 MG/3DAYS TD PT72
1.0000 | MEDICATED_PATCH | TRANSDERMAL | Status: DC
  Filled 2019-09-20: qty 1

## 2019-09-20 MED ORDER — PROPOFOL 10 MG/ML IV BOLUS
INTRAVENOUS | Status: AC
  Filled 2019-09-20: qty 20

## 2019-09-20 MED ORDER — BUPIVACAINE HCL (PF) 0.5 % IJ SOLN
INTRAMUSCULAR | Status: DC | PRN
  Administered 2019-09-20: 10 mL

## 2019-09-20 MED ORDER — ROCURONIUM BROMIDE 10 MG/ML (PF) SYRINGE
PREFILLED_SYRINGE | INTRAVENOUS | Status: AC
  Filled 2019-09-20: qty 10

## 2019-09-20 MED ORDER — PHENYLEPHRINE HCL-NACL 10-0.9 MG/250ML-% IV SOLN
INTRAVENOUS | Status: DC | PRN
  Administered 2019-09-20: 25 ug/min via INTRAVENOUS

## 2019-09-20 MED ORDER — MIDAZOLAM HCL 5 MG/5ML IJ SOLN
INTRAMUSCULAR | Status: DC | PRN
  Administered 2019-09-20: 2 mg via INTRAVENOUS

## 2019-09-20 MED ORDER — ONDANSETRON HCL 4 MG/2ML IJ SOLN: 4.0000 mg | Freq: Four times a day (QID) | INTRAMUSCULAR | Status: DC | PRN

## 2019-09-20 MED ORDER — LACTATED RINGERS IV BOLUS
500.0000 mL | Freq: Once | INTRAVENOUS | Status: AC
  Administered 2019-09-20: 500 mL via INTRAVENOUS

## 2019-09-20 MED ORDER — OXYCODONE-ACETAMINOPHEN 5-325 MG PO TABS: 1.0000 | ORAL_TABLET | ORAL | 0 refills | Status: DC | PRN

## 2019-09-20 MED ORDER — PHENYLEPHRINE 40 MCG/ML (10ML) SYRINGE FOR IV PUSH (FOR BLOOD PRESSURE SUPPORT)
PREFILLED_SYRINGE | INTRAVENOUS | Status: AC
  Filled 2019-09-20: qty 10

## 2019-09-20 MED ORDER — FENTANYL CITRATE (PF) 100 MCG/2ML IJ SOLN: 25.0000 ug | INTRAMUSCULAR | Status: DC | PRN

## 2019-09-20 MED ORDER — FENTANYL CITRATE (PF) 100 MCG/2ML IJ SOLN
INTRAMUSCULAR | Status: AC
  Filled 2019-09-20: qty 2

## 2019-09-20 MED ORDER — PROMETHAZINE HCL 25 MG/ML IJ SOLN
INTRAMUSCULAR | Status: AC
  Administered 2019-09-20: 6.25 mg via INTRAVENOUS
  Filled 2019-09-20: qty 1

## 2019-09-20 MED ORDER — ESMOLOL HCL 100 MG/10ML IV SOLN
INTRAVENOUS | Status: AC
  Filled 2019-09-20: qty 10

## 2019-09-20 MED ORDER — STERILE WATER FOR IRRIGATION IR SOLN
Status: DC | PRN
  Administered 2019-09-20: 2000 mL

## 2019-09-20 MED ORDER — ACETAMINOPHEN 500 MG PO TABS
1000.0000 mg | ORAL_TABLET | Freq: Once | ORAL | Status: AC
  Administered 2019-09-20: 1000 mg via ORAL
  Filled 2019-09-20: qty 2

## 2019-09-20 MED ORDER — PHENYLEPHRINE 40 MCG/ML (10ML) SYRINGE FOR IV PUSH (FOR BLOOD PRESSURE SUPPORT)
PREFILLED_SYRINGE | INTRAVENOUS | Status: DC | PRN
  Administered 2019-09-20: 160 ug via INTRAVENOUS
  Administered 2019-09-20 (×2): 120 ug via INTRAVENOUS

## 2019-09-20 MED ORDER — TRANEXAMIC ACID-NACL 1000-0.7 MG/100ML-% IV SOLN
1000.0000 mg | INTRAVENOUS | Status: AC
  Administered 2019-09-20: 1000 mg via INTRAVENOUS
  Filled 2019-09-20: qty 100

## 2019-09-20 MED ORDER — ONDANSETRON HCL 4 MG PO TABS
4.0000 mg | ORAL_TABLET | Freq: Four times a day (QID) | ORAL | Status: DC | PRN
  Filled 2019-09-20: qty 1

## 2019-09-20 MED ORDER — CEFAZOLIN SODIUM-DEXTROSE 2-4 GM/100ML-% IV SOLN
2.0000 g | INTRAVENOUS | Status: AC
  Administered 2019-09-20: 2 g via INTRAVENOUS
  Filled 2019-09-20: qty 100

## 2019-09-20 MED ORDER — SUGAMMADEX SODIUM 200 MG/2ML IV SOLN
INTRAVENOUS | Status: DC | PRN
  Administered 2019-09-20: 200 mg via INTRAVENOUS

## 2019-09-20 MED ORDER — MIDAZOLAM HCL 2 MG/2ML IJ SOLN
INTRAMUSCULAR | Status: AC
  Filled 2019-09-20: qty 2

## 2019-09-20 MED ORDER — 0.9 % SODIUM CHLORIDE (POUR BTL) OPTIME
TOPICAL | Status: DC | PRN
  Administered 2019-09-20: 1000 mL

## 2019-09-20 MED ORDER — ONDANSETRON HCL 4 MG PO TABS: 4.0000 mg | ORAL_TABLET | Freq: Three times a day (TID) | ORAL | 0 refills | Status: DC | PRN

## 2019-09-20 MED ORDER — METOCLOPRAMIDE HCL 5 MG PO TABS
5.0000 mg | ORAL_TABLET | Freq: Three times a day (TID) | ORAL | Status: DC | PRN
  Filled 2019-09-20: qty 2

## 2019-09-20 MED ORDER — ORAL CARE MOUTH RINSE: 15.0000 mL | Freq: Once | OROMUCOSAL | Status: AC

## 2019-09-20 MED ORDER — LACTATED RINGERS IV BOLUS
250.0000 mL | Freq: Once | INTRAVENOUS | Status: AC
  Administered 2019-09-20: 250 mL via INTRAVENOUS

## 2019-09-20 MED ORDER — PROMETHAZINE HCL 25 MG/ML IJ SOLN: 6.2500 mg | INTRAMUSCULAR | Status: DC | PRN

## 2019-09-20 MED ORDER — SUCCINYLCHOLINE CHLORIDE 200 MG/10ML IV SOSY
PREFILLED_SYRINGE | INTRAVENOUS | Status: AC
  Filled 2019-09-20: qty 10

## 2019-09-20 MED ORDER — ROCURONIUM BROMIDE 10 MG/ML (PF) SYRINGE
PREFILLED_SYRINGE | INTRAVENOUS | Status: DC | PRN
  Administered 2019-09-20: 70 mg via INTRAVENOUS

## 2019-09-20 MED ORDER — METOCLOPRAMIDE HCL 5 MG/ML IJ SOLN: 5.0000 mg | Freq: Three times a day (TID) | INTRAMUSCULAR | Status: DC | PRN

## 2019-09-20 MED ORDER — BUPIVACAINE LIPOSOME 1.3 % IJ SUSP
INTRAMUSCULAR | Status: DC | PRN
  Administered 2019-09-20: 10 mL

## 2019-09-20 MED ORDER — LACTATED RINGERS IV SOLN: INTRAVENOUS | Status: DC

## 2019-09-20 MED ORDER — CHLORHEXIDINE GLUCONATE 0.12 % MT SOLN
15.0000 mL | Freq: Once | OROMUCOSAL | Status: AC
  Administered 2019-09-20: 15 mL via OROMUCOSAL

## 2019-09-20 MED ORDER — DEXAMETHASONE SODIUM PHOSPHATE 10 MG/ML IJ SOLN
INTRAMUSCULAR | Status: DC | PRN
  Administered 2019-09-20: 10 mg via INTRAVENOUS

## 2019-09-20 MED ORDER — ONDANSETRON HCL 4 MG/2ML IJ SOLN
INTRAMUSCULAR | Status: AC
  Filled 2019-09-20: qty 2

## 2019-09-20 MED FILL — OXYCODONE-APAP 5-325MG: 5-325 | 5 days supply | Qty: 30 | Fill #0

## 2019-09-20 MED FILL — ONDANSETRON HCL 4 MG TABS: 4 | 6 days supply | Qty: 20 | Fill #0

## 2019-09-20 SURGICAL SUPPLY — 75 items
ADH SKN CLS APL DERMABOND .7 (GAUZE/BANDAGES/DRESSINGS) ×1
AID PSTN UNV HD RSTRNT DISP (MISCELLANEOUS) ×1
BAG SPEC THK2 15X12 ZIP CLS (MISCELLANEOUS) ×1
BAG ZIPLOCK 12X15 (MISCELLANEOUS) ×2 IMPLANT
BLADE SAW SGTL 83.5X18.5 (BLADE) ×2 IMPLANT
BSPLAT GLND +2X24 MDLR (Joint) ×1 IMPLANT
COOLER ICEMAN CLASSIC (MISCELLANEOUS) IMPLANT
COVER BACK TABLE 60X90IN (DRAPES) ×2 IMPLANT
COVER SURGICAL LIGHT HANDLE (MISCELLANEOUS) ×2 IMPLANT
COVER WAND RF STERILE (DRAPES) IMPLANT
CUP SUT UNIV REVERS 36 NEUTRAL (Cup) ×2 IMPLANT
DERMABOND ADVANCED (GAUZE/BANDAGES/DRESSINGS) ×1
DERMABOND ADVANCED .7 DNX12 (GAUZE/BANDAGES/DRESSINGS) ×1 IMPLANT
DRAPE INCISE IOBAN 66X45 STRL (DRAPES) IMPLANT
DRAPE ORTHO SPLIT 77X108 STRL (DRAPES) ×4
DRAPE SHEET LG 3/4 BI-LAMINATE (DRAPES) ×2 IMPLANT
DRAPE SURG 17X11 SM STRL (DRAPES) ×2 IMPLANT
DRAPE SURG ORHT 6 SPLT 77X108 (DRAPES) ×2 IMPLANT
DRAPE U-SHAPE 47X51 STRL (DRAPES) ×2 IMPLANT
DRESSING AQUACEL AG SP 3.5X6 (GAUZE/BANDAGES/DRESSINGS) ×1 IMPLANT
DRSG AQUACEL AG ADV 3.5X10 (GAUZE/BANDAGES/DRESSINGS) ×2 IMPLANT
DRSG AQUACEL AG SP 3.5X6 (GAUZE/BANDAGES/DRESSINGS) ×2
DURAPREP 26ML APPLICATOR (WOUND CARE) ×2 IMPLANT
ELECT BLADE TIP CTD 4 INCH (ELECTRODE) ×2 IMPLANT
ELECT REM PT RETURN 15FT ADLT (MISCELLANEOUS) ×2 IMPLANT
FACESHIELD WRAPAROUND (MASK) ×10 IMPLANT
GLENOID UNI REV MOD 24 +2 LAT (Joint) ×2 IMPLANT
GLENOSPHERE 36 +4 LAT/24 (Joint) ×2 IMPLANT
GLOVE BIO SURGEON STRL SZ7.5 (GLOVE) ×2 IMPLANT
GLOVE BIO SURGEON STRL SZ8 (GLOVE) ×2 IMPLANT
GLOVE SS BIOGEL STRL SZ 7 (GLOVE) ×1 IMPLANT
GLOVE SS BIOGEL STRL SZ 7.5 (GLOVE) ×2 IMPLANT
GLOVE SUPERSENSE BIOGEL SZ 7 (GLOVE) ×1
GLOVE SUPERSENSE BIOGEL SZ 7.5 (GLOVE) ×2
GLOVE SURG SYN 7.0 (GLOVE) IMPLANT
GLOVE SURG SYN 7.5  E (GLOVE)
GLOVE SURG SYN 7.5 E (GLOVE) IMPLANT
GLOVE SURG SYN 8.0 (GLOVE) IMPLANT
GOWN STRL REUS W/TWL LRG LVL3 (GOWN DISPOSABLE) ×4 IMPLANT
KIT BASIN OR (CUSTOM PROCEDURE TRAY) ×2 IMPLANT
KIT TURNOVER KIT A (KITS) IMPLANT
LINER HUMERAL 36 +3MM SM (Shoulder) ×2 IMPLANT
MANIFOLD NEPTUNE II (INSTRUMENTS) ×2 IMPLANT
NEEDLE TAPERED W/ NITINOL LOOP (MISCELLANEOUS) ×2 IMPLANT
NS IRRIG 1000ML POUR BTL (IV SOLUTION) ×2 IMPLANT
PACK SHOULDER (CUSTOM PROCEDURE TRAY) ×2 IMPLANT
PAD ARMBOARD 7.5X6 YLW CONV (MISCELLANEOUS) ×2 IMPLANT
PAD COLD SHLDR WRAP-ON (PAD) IMPLANT
PIN NITINOL TARGETER 2.8 (PIN) IMPLANT
PIN SET MODULAR GLENOID SYSTEM (PIN) ×2 IMPLANT
RESTRAINT HEAD UNIVERSAL NS (MISCELLANEOUS) ×2 IMPLANT
SCREW CENTRAL MODULAR 25 (Screw) ×2 IMPLANT
SCREW PERI LOCK 5.5X16 (Screw) ×2 IMPLANT
SCREW PERIPHERAL 5.5X20 LOCK (Screw) ×2 IMPLANT
SCREW PERIPHERAL 5.5X28 LOCK (Screw) ×4 IMPLANT
SLEEVE SURGEON STRL (DRAPES) ×2 IMPLANT
SLING ARM FOAM STRAP LRG (SOFTGOODS) IMPLANT
SLING ARM FOAM STRAP MED (SOFTGOODS) ×2 IMPLANT
SPONGE LAP 18X18 RF (DISPOSABLE) IMPLANT
STEM HUMERAL MOD SZ 5 135 DEG (Stem) ×2 IMPLANT
SUCTION FRAZIER HANDLE 12FR (TUBING) ×1
SUCTION TUBE FRAZIER 12FR DISP (TUBING) ×1 IMPLANT
SUT FIBERTAPE CERCLAGE 2 48 (SUTURE) ×2 IMPLANT
SUT FIBERWIRE #2 38 T-5 BLUE (SUTURE)
SUT MNCRL AB 3-0 PS2 18 (SUTURE) ×2 IMPLANT
SUT MON AB 2-0 CT1 36 (SUTURE) ×2 IMPLANT
SUT VIC AB 1 CT1 36 (SUTURE) ×4 IMPLANT
SUTURE FIBERWR #2 38 T-5 BLUE (SUTURE) IMPLANT
SUTURE TAPE 1.3 40 TPR END (SUTURE) ×2 IMPLANT
SUTURETAPE 1.3 40 TPR END (SUTURE) ×4
SYR BULB EAR ULCER 3OZ GRN STR (SYRINGE) ×2 IMPLANT
TOWEL OR 17X26 10 PK STRL BLUE (TOWEL DISPOSABLE) ×2 IMPLANT
TOWEL OR NON WOVEN STRL DISP B (DISPOSABLE) ×2 IMPLANT
WATER STERILE IRR 1000ML POUR (IV SOLUTION) ×4 IMPLANT
YANKAUER SUCT BULB TIP 10FT TU (MISCELLANEOUS) ×2 IMPLANT

## 2019-09-20 NOTE — Transfer of Care (Signed)
Immediate Anesthesia Transfer of Care Note  Patient: Anne Garrett  Procedure(s) Performed: REVERSE SHOULDER ARTHROPLASTY (Right Shoulder)  Patient Location: PACU  Anesthesia Type:GA combined with regional for post-op pain  Level of Consciousness: awake, oriented, patient cooperative and responds to stimulation  Airway & Oxygen Therapy: Patient Spontanous Breathing and Patient connected to face mask oxygen  Post-op Assessment: Report given to RN and Post -op Vital signs reviewed and stable  Post vital signs: Reviewed and stable  Last Vitals:  Vitals Value Taken Time  BP 100/76 09/20/19 0954  Temp    Pulse 93 09/20/19 0956  Resp 12 09/20/19 0956  SpO2 95 % 09/20/19 0956  Vitals shown include unvalidated device data.  Last Pain:  Vitals:   09/20/19 0530  TempSrc: Oral      Patients Stated Pain Goal: 4 (85/27/78 2423)  Complications: No complications documented.

## 2019-09-20 NOTE — Evaluation (Signed)
Occupational Therapy Evaluation Patient Details Name: Anne Garrett MRN: [id number] DOB: February 19, 1975 Today's Date: 09/20/2019    History of Present Illness s/p right shoulder reverse arhtroplasty with a pertinent history of RA and prior joint replacements.   Clinical Impression   Patient is a 44 year old woman s/p shoulder replacement without functional use of right dominant upper extremity secondary to effects of surgery and interscalene block and shoulder precautions. Therapist provided education and instruction to patient in regards to exercises, precautions, positioning, donning upper extremity clothing and bathing while maintaining shoulder precautions, ice and edema management and donning/doffing sling. Patient understanding and demonstrated as needed. Patient needed assistance to donn shirt, underwear, and pants provided with instruction on compensatory strategies to perform ADLs. Patient reports prior shoulder replacement and having AE at home. Patient to follow up with MD for further therapy needs.      Follow Up Recommendations  Follow surgeon's recommendation for DC plan and follow-up therapies    Equipment Recommendations  None recommended by OT    Recommendations for Other Services       Precautions / Restrictions Precautions Precautions: Sternal Precaution Comments: OK to use operative arm for feeding, hygiene and ADLs. Ok to instruct Pendulums and lap slides as exercises. Ok to use operative arm within the following parameters for ADL purposes New ROM (8/18) Ok for PROM, AAROM, AROM within pain tolerance and within the following ROM ER 20 ABD 45 FE 60 Required Braces or Orthoses: Sling Restrictions Weight Bearing Restrictions: Yes RUE Weight Bearing: Non weight bearing      Mobility Bed Mobility                  Transfers                 General transfer comment: Supervision for standing.    Balance Overall balance assessment: No apparent  balance deficits (not formally assessed)                                         ADL either performed or assessed with clinical judgement   ADL Overall ADL's : Needs assistance/impaired Eating/Feeding: Set up   Grooming: Modified independent   Upper Body Bathing: Modified independent   Lower Body Bathing: Modified independent   Upper Body Dressing : Minimal assistance;Set up Upper Body Dressing Details (indicate cue type and reason): Min assist to get t shirt over head. Lower Body Dressing: Minimal assistance Lower Body Dressing Details (indicate cue type and reason): Min assist to pull clothing over right hip. Toilet Transfer: Warehouse manager and Hygiene: Minimal assistance Toileting - Clothing Manipulation Details (indicate cue type and reason): clothing management over right hip.     Functional mobility during ADLs: Supervision/safety       Vision Baseline Vision/History: Wears glasses Wears Glasses: At all times       Perception     Praxis      Pertinent Vitals/Pain Pain Assessment: No/denies pain     Hand Dominance     Extremity/Trunk Assessment Upper Extremity Assessment Upper Extremity Assessment: RUE deficits/detail RUE Deficits / Details: Non functional secondary to effects of interscalene block           Communication     Cognition Arousal/Alertness: Awake/alert Behavior During Therapy: WFL for tasks assessed/performed Overall Cognitive Status: Within Functional Limits for tasks assessed  General Comments       Exercises     Shoulder Instructions Shoulder Instructions Donning/doffing shirt without moving shoulder: Minimal assistance;Patient able to independently direct caregiver Method for sponge bathing under operated UE: Independent Donning/doffing sling/immobilizer: Independent Correct positioning of sling/immobilizer:  Independent Pendulum exercises (written home exercise program): Independent ROM for elbow, wrist and digits of operated UE: Independent Sling wearing schedule (on at all times/off for ADL's): Independent Proper positioning of operated UE when showering: Independent Dressing change: Independent Positioning of UE while sleeping: [place] expects to be discharged to:: Private residence Living Arrangements: Spouse/significant other                                      Prior Functioning/Environment                   OT Problem List: Decreased strength;Decreased range of motion;Impaired UE functional use      OT Treatment/Interventions:      OT Goals(Current goals can be found in the care plan section) Acute Rehab OT Goals Patient Stated Goal: To go home today OT Goal Formulation: All assessment and education complete, DC therapy  OT Frequency:     Barriers to D/C:            Co-evaluation              AM-PAC OT "6 Clicks" Daily Activity     Outcome Measure Help from another person eating meals?: A Little Help from another person taking care of personal grooming?: None Help from another person toileting, which includes using toliet, bedpan, or urinal?: A Little Help from another person bathing (including washing, rinsing, drying)?: None Help from another person to put on and taking off regular upper body clothing?: A Little Help from another person to put on and taking off regular lower body clothing?: A Little 6 Click Score: 20   End of Session Nurse Communication:  (OT education complete)  Activity Tolerance: Patient tolerated treatment well Patient left: in chair;with call bell/phone within reach  OT Visit Diagnosis: Muscle weakness (generalized) (M62.81)                Time: 1594-5859 OT Time Calculation (min): 15 min Charges:  OT General Charges $OT Visit: 1 Visit OT Evaluation $OT Eval Low Complexity:  1 Low  Emmilia Sowder, OTR/L [place]  Office [phone] Pager: [phone]   Lenward Chancellor 09/20/2019, 12:36 PM

## 2019-09-20 NOTE — Progress Notes (Signed)
Orthopedic Tech Progress Note Patient Details:  Anne Garrett [birth year] [id number]  Ortho Devices Type of Ortho Device: Arm sling Ortho Device/Splint Location: right Ortho Device/Splint Interventions: Application   Post Interventions Patient Tolerated: Well Instructions Provided: Care of device   [name] 09/20/2019, 1:06 PM

## 2019-09-20 NOTE — Op Note (Signed)
09/20/2019  9:47 AM  PATIENT:   Anne Garrett  44 y.o. female  PRE-OPERATIVE DIAGNOSIS:  Right shoulder rheumatoid arthritis/osteoarthritis  POST-OPERATIVE DIAGNOSIS: Same  PROCEDURE: Right shoulder reverse arthroplasty utilizing a press-fit size 5.5 Arthrex stem with a +3 polyethylene insert, 36/+4 glenosphere on a small/+2 baseplate  SURGEON:  Eilan Mcinerny, Metta Clines M.D.  ASSISTANTS: Jenetta Loges, PA-C  ANESTHESIA:   General endotracheal and interscalene block with Exparel  EBL: 150 cc  SPECIMEN: None  Drains: None   PATIENT DISPOSITION:  PACU - hemodynamically stable.    PLAN OF CARE: Discharge to home after PACU  Brief history:  Ms. Buffkin is well-known to our practice after previous left shoulder reverse arthroplasty for the treatment of severe combined osteo and rheumatoid arthritis.  He is now experiencing increasing pain and functional mutations related to right shoulder combined osteo and rheumatoid arthritis.  Due to her increasing pain and functional rotations she is brought to the operating this time for planned right shoulder reverse arthroplasty.  Preoperatively, I counseled the patient regarding treatment options and risks versus benefits thereof.  Possible surgical complications were all reviewed including potential for bleeding, infection, neurovascular injury, persistent pain, loss of motion, anesthetic complication, failure of the implant, and possible need for additional surgery. They understand and accept and agrees with our planned procedure.   Procedure in detail:  After undergoing routine preop evaluation patient received prophylactic antibiotics and interscalene block with Exparel established in the holding area by the anesthesia department.  Patient subsequently placed spine on the operating table and underwent the smooth induction of a general endotracheal anesthesia using the glide scope.  Patient subsequently placed into the beachchair position and  appropriately padded and protected.  The right shoulder girdle region was sterilely prepped and draped in standard fashion.  Of note the patient had absolutely no motion in the glenohumeral joint on examination under anesthesia.  Timeout was called.  An anterior deltopectoral approach to the right shoulder is made from approximately a centimeter incision.  Skin flaps were elevated dissection carried deeply the deltopectoral interval was then developed proximal to distal with the vein taken laterally.  The upper centimeter of the pectoralis major tendon was tenotomized for exposure and adhesions were divided beneath the deltoid.  The conjoined tendon was mobilized and retracted medially.  There was a complete ankylosis of the joint and exposure at this time became quite difficult and we initially performed a long head biceps tenodesis at the level of the pectoralis major tendon and then excised the proximal segment.  There had been such severe medial erosion and superior migration the interval between the humeral head and the acromion was very difficult to gain access to.  We performed meticulous debridement removing the insertion of the remnant of the subscapularis from the lesser tuberosity and then dividing subperiosteally around the humeral neck to improve external rotation exposure.  We then then divided residual attachments to the greater tuberosity related to the remnants of the rotator cuff and in a stepwise fashion ultimately gained access about the entirety of the proximal humerus.  To gain adequate exposure I did have to use the oscillating saw to perform a provisional humeral head osteotomy while the joint was located and did carefully protect the surrounding soft tissues.  Once the humeral head was removed it could then gain access to the superior and posterior aspects of the rotator cuff remnants which were removed and this helped mobilize the humeral metaphysis that static could ultimately delivered  through the wound.  We then performed a final humeral head osteotomy after using the extra medullary guide to confirm the appropriate degree of version and angulation.  The final head cut was then made with the oscillating saw.  We then exposed the glenoid using appropriate retractors and performed a circumferential labral resection.  A guidepin was then directed into the center of the glenoid with an approximately 10 degree inferior tilt and the glenoid surface was then reamed to a stable subchondral bony bed.  The peripheral osteophytes were then carefully removed and the peripheral reamer was used.  Glenoid preparation was then completed with drilling and tapping in the central hole and a 25 mm lag screw was chosen.  The baseplate was assembled the baseplate was then inserted with excellent fit and fixation in the peripheral locking screws were all then placed with excellent fit and fixation.  We then irrigated the joint and placed a 36/+4 glenosphere onto the baseplate and this was then impacted and then central locking screw was placed with excellent fit and fixation.  We then returned our attention back to the proximal humerus where the canal was opened and we broached to size 5.5.  After this provisional seen in room reamed the metaphysis and with placement of a trial we were unable to achieve the reduction.  With this finding it was clear that we need to seat the humeral stem more deeply and so prophylactically we placed a fiber tape cerclage wire around the humeral metaphysis and then we went ahead and broached more deeply with the 5.5 and repeated the metaphyseal reaming and a trial was then placed at this point we could achieve an acceptable reduction with good motion good soft tissue balance.  This point our final implant was then assembled it was then impacted into the metaphysis at approximate 20 degrees retroversion matching the retroversion with good fit and fixation.  We then confirmed appropriate  reduction could be achieved.  +3 probably showed excellent soft tissue balance good motion good stability.  The trial was removed the final +3 polywas impacted after the implant was cleaned and dried.  Final reduction was then performed and then this did show good shoulder motion good stability and good soft tissue balance.  We then retightened the cerclage sutures and these were then terminally tied with overhand throws and the suture limbs were then clipped.  The wound was copes irrigated.  We did ligate the cephalic vein due to compromise related to the exposure.  Hemostasis was obtained.  The deltopectoral interval was closed with a series of figure-of-eight #1 Vicryl sutures.  2-0 Monocryl used in the subcu layer and intracuticular 3 Monocryl for the skin followed by Dermabond and Aquacel dressing.  Right arm was placed into a sling the patient was awakened, extubated, and taken to the recovery in stable addition.  Jenetta Loges, PA-C was utilized as an Environmental consultant throughout this case, essential for help with positioning the patient, positioning extremity, tissue manipulation, implantation of the prosthesis, suture management, wound closure, and intraoperative decision-making.  Marin Shutter MD   Contact # [phone]

## 2019-09-20 NOTE — Anesthesia Procedure Notes (Signed)
Procedure Name: Intubation Date/Time: 09/20/2019 7:43 AM Performed by: Silas Sacramento, CRNA Pre-anesthesia Checklist: Patient identified, Emergency Drugs available, Suction available and Patient being monitored Patient Re-evaluated:Patient Re-evaluated prior to induction Oxygen Delivery Method: Circle system utilized Preoxygenation: Pre-oxygenation with 100% oxygen Induction Type: IV induction Laryngoscope Size: Glidescope and 3 Grade View: Grade I Tube type: Oral Tube size: 7.0 mm Number of attempts: 1 Airway Equipment and Method: Video-laryngoscopy and Rigid stylet Placement Confirmation: ETT inserted through vocal cords under direct vision,  positive ETCO2 and breath sounds checked- equal and bilateral Secured at: 22 cm Tube secured with: Tape Dental Injury: Teeth and Oropharynx as per pre-operative assessment  Difficulty Due To: Difficulty was anticipated and Difficult Airway- due to reduced neck mobility Comments: Initially G2V with Glidescope 3, attained G1V with BURP.

## 2019-09-20 NOTE — Anesthesia Procedure Notes (Signed)
Anesthesia Regional Block: Interscalene brachial plexus block   Pre-Anesthetic Checklist: ,, timeout performed, Correct Patient, Correct Site, Correct Laterality, Correct Procedure, Correct Position, site marked, Risks and benefits discussed,  Surgical consent,  Pre-op evaluation,  At surgeon's request and post-op pain management  Laterality: Right  Prep: chloraprep       Needles:  Injection technique: Single-shot  Needle Type: Echogenic Stimulator Needle     Needle Length: 5cm  Needle Gauge: 22     Additional Needles:   Procedures:,,,, ultrasound used (permanent image in chart),,,, #20gu IV placed  Narrative:  Start time: 09/20/2019 6:39 AM End time: 09/20/2019 6:49 AM Injection made incrementally with aspirations every 5 mL.  Performed by: Personally  Anesthesiologist: Duane Boston, MD  Additional Notes: Functioning IV was confirmed and monitors applied.  A 27mm 22ga echogenic arrow stimulator was used. Sterile prep and drape,hand hygiene and sterile gloves were used.Ultrasound guidance: relevant anatomy identified, needle position confirmed, local anesthetic spread visualized around nerve(s)., vascular puncture avoided.  Image printed for medical record.  Negative aspiration and negative test dose prior to incremental administration of local anesthetic. The patient tolerated the procedure well.

## 2019-09-20 NOTE — H&P (Signed)
Anne Garrett    Chief Complaint: Right shoulder rheumatoid arthritis/osteoarthritis HPI: The patient is a 44 y.o. female with chronic right shoulder pain related to severe underlying rheumatoid arthritis with the associated bony destruction and deformity  Past Medical History:  Diagnosis Date  . Adrenal insufficiency ([place])    secondary to Prednisone  . Eczema    coner oif eye lid  . Headache    Migraine  . Heart murmur 2012   -? heard at PCP visit 10-15 years ago-had ECHO -benign  . Intervertebral disk disease   . Juvenile idiopathic arthritis ([place])   . Osteoporosis   . Rheumatoid arthritis ([place])    Juvenile onset    Past Surgical History:  Procedure Laterality Date  . ANKLE ARTHROPLASTY Right   . ANTERIOR CERVICAL DECOMP/DISCECTOMY FUSION N/A 06/19/2015   Procedure: C3-4 C4-5 Anterior cervical decompression/diskectomy/fusion;  Surgeon: Jovita Gamma, MD;  Location: [facility];  Service: Neurosurgery;  Laterality: N/A;  C3-4 C4-5 Anterior cervical decompression/diskectomy/fusion  . APPENDECTOMY    . CARPAL TUNNEL RELEASE Right   . CESAREAN SECTION    . CHOLECYSTECTOMY    . CHOLECYSTECTOMY  2006  . COLONOSCOPY    . ESSURE TUBAL LIGATION     Essure Implant-Permanent birth control  . excision to palm Left    excision of mass to right plam  . FOOT ARTHRODESIS Left 03/01/2019   Procedure: Left talonavicular and naviculocuneiform arthrodesis;  Surgeon: [name], MD;  Location: [facility];  Service: Orthopedics;  Laterality: Left;  . HARDWARE REMOVAL Right 02/02/2017   Procedure: RIGHT WRIST DEEP IMPLANT REMOVAL, RIGHT WRIST AND HAND EXTENSOR TENDON RECONSTRUCTION, TENDON TRANSFER AND RECONSTRUCTION AS INDICATED;  Surgeon: Iran Planas, MD;  Location: [facility];  Service: Orthopedics;  Laterality: Right;  . JOINT REPLACEMENT Bilateral    bilat hip replacements  . ORIF FEMUR FRACTURE Right   . OTHER SURGICAL HISTORY  06/04/2016   Revision of PLIF  . plif  l5-s1 arthrodeisis    2018  . POSTERIOR CERVICAL FUSION/FORAMINOTOMY N/A 07/12/2015   Procedure: C2 to C5 Cervical laminectomy, C2 to C5 posterior cervical arthrodesis with instrumentation and bone graft;  Surgeon: Jovita Gamma, MD;  Location: [facility];  Service: Neurosurgery;  Laterality: N/A;  C2 to C5 Cervical laminectomy, C2  to C5 posterior cervical arthrodesis with instrumentation and bone graft  . REPAIR EXTENSOR TENDON Right 02/02/2017   Procedure: REPAIR EXTENSOR TENDON;  Surgeon: Iran Planas, MD;  Location: [facility];  Service: Orthopedics;  Laterality: Right;  . REVERSE SHOULDER ARTHROPLASTY Left 09/07/2018   Procedure: REVERSE SHOULDER ARTHROPLASTY;  Surgeon: [name], MD;  Location: [facility];  Service: Orthopedics;  Laterality: Left;  . right foot surgery Right    right jfoot arthrodesis and right ankle arthroplasty  . WRIST SURGERY Bilateral    arthrodesis    Family History  Problem Relation Age of Onset  . Breast cancer Mother 6  . Nephritis Father   . Epilepsy Father   . Breast cancer Maternal Aunt        2 mat aunts   . Multiple sclerosis Maternal Uncle   . Psoriasis Paternal Aunt   . Diabetes Daughter     Social History:  reports that she quit smoking about 4 years ago. Her smoking use included cigarettes. She has a 5.00 pack-year smoking history. She has never used smokeless tobacco. She reports current alcohol use of about 2.0 standard drinks of alcohol per week. She reports that she does  not use drugs.   Medications Prior to Admission  Medication Sig Dispense Refill  . Butalbital-APAP-Caffeine 50-325-40 MG capsule Take 1 capsule by mouth daily as needed. 30 capsule 0  . Calcium Carb-Cholecalciferol (CALCIUM+D3 PO) Take 1 tablet by mouth daily.    . Cholecalciferol (VITAMIN D3) 50 MCG (2000 UT) TABS Take 2,000 Units by mouth daily.    . Multiple Vitamin (MULTIVITAMIN WITH MINERALS) TABS tablet Take 1 tablet by mouth daily.    . Nutritional Supplements (JUICE  PLUS FIBRE PO) Take by mouth.    . predniSONE (DELTASONE) 5 MG tablet Take 5 mg by mouth every evening.     . Tocilizumab (ACTEMRA) 162 MG/0.9ML SOSY Inject 0.44ml subcutaneous every other week. (Patient taking differently: Inject 162 mg into the skin every 14 (fourteen) days. Inject 0.26ml subcutaneous every other week.) 1.8 mL 2  . traMADol (ULTRAM) 50 MG tablet Take 100 mg by mouth 2 (two) times daily.     Marland Kitchen triamcinolone cream (KENALOG) 0.1 % Apply 1 application topically 2 (two) times daily as needed. (Patient taking differently: Apply 1 application topically 2 (two) times daily as needed (eczema). ) 15 g 1  . docusate sodium (COLACE) 100 MG capsule Take 1 capsule (100 mg total) by mouth daily as needed. (Patient not taking: Reported on 09/04/2019) 30 capsule 2  . ibuprofen (ADVIL,MOTRIN) 200 MG tablet Take 600-800 mg by mouth 2 (two) times daily as needed for headache, mild pain or moderate pain.  (Patient not taking: Reported on 09/04/2019)    . metroNIDAZOLE (METROCREAM) 0.75 % cream Apply topically 2 (two) times daily. (Patient taking differently: Apply 1 application topically 2 (two) times daily as needed (rosacea). ) 45 g 11  . ondansetron (ZOFRAN) 8 MG tablet Take 8 mg by mouth every 8 (eight) hours as needed for nausea or vomiting.  (Patient not taking: Reported on 09/04/2019)    . pantoprazole (PROTONIX) 40 MG tablet Take 40 mg by mouth 2 (two) times daily.  (Patient not taking: Reported on 09/04/2019)    . promethazine (PHENERGAN) 12.5 MG tablet Take 1-2 tablets (12.5-25 mg total) by mouth every 8 (eight) hours as needed for nausea or vomiting. (Patient not taking: Reported on 09/04/2019) 20 tablet 0  . rizatriptan (MAXALT-MLT) 10 MG disintegrating tablet Take 1 tablet (10 mg total) by mouth daily as needed for migraine. May repeat in 2 hours if needed 10 tablet 2  . senna (SENOKOT) 8.6 MG TABS tablet Take 2 tablets (17.2 mg total) by mouth 2 (two) times daily. (Patient not taking: Reported on  09/04/2019) 30 tablet 0     Physical Exam: Right shoulder demonstrates painful and guarded motion as noted at recent office visits.  Plain radiographs confirm severe bony deformity and complete obliteration of the joint space.  Vitals  Weight:  [55.3 kg] 55.3 kg (09/16 0603)  Assessment/Plan  Impression: Right shoulder rheumatoid arthritis/osteoarthritis  Plan of Action: Procedure(s): REVERSE SHOULDER ARTHROPLASTY  Tally Mckinnon M Christepher Melchior 09/20/2019, 6:16 AM Contact # [phone]

## 2019-09-20 NOTE — Discharge Instructions (Signed)
 Kevin M. Supple, M.D., F.A.A.O.S. Orthopaedic Surgery Specializing in Arthroscopic and Reconstructive Surgery of the Shoulder [phone] [address] - [place], [place] [address] - Fax [phone]   POST-OP TOTAL SHOULDER REPLACEMENT INSTRUCTIONS  1. Follow up in the office for your first post-op appointment 10-14 days from the date of your surgery. If you do not already have a scheduled appointment, our office will contact you to schedule.  2. The bandage over your incision is waterproof. You may begin showering with this dressing on. You may leave this dressing on until first follow up appointment within 2 weeks. We prefer you leave this dressing in place until follow up however after 5-7 days if you are having itching or skin irritation and would like to remove it you may do so. Go slow and tug at the borders gently to break the bond the dressing has with the skin. At this point if there is no drainage it is okay to go without a bandage or you may cover it with a light guaze and tape. You can also expect significant bruising around your shoulder that will drift down your arm and into your chest wall. This is very normal and should resolve over several days.   3. Wear your sling/immobilizer at all times except to perform the exercises below or to occasionally let your arm dangle by your side to stretch your elbow. You also need to sleep in your sling immobilizer until instructed otherwise. It is ok to remove your sling if you are sitting in a controlled environment and allow your arm to rest in a position of comfort by your side or on your lap with pillows to give your neck and skin a break from the sling. You may remove it to allow arm to dangle by side to shower. If you are up walking around and when you go to sleep at night you need to wear it.  4. Range of motion to your elbow, wrist, and hand are encouraged 3-5 times daily. Exercise to your hand and fingers helps to reduce  swelling you may experience.   5. Prescriptions for a pain medication and a muscle relaxant are provided for you. It is recommended that if you are experiencing pain that you pain medication alone is not controlling, add the muscle relaxant along with the pain medication which can give additional pain relief. The first 1-2 days is generally the most severe of your pain and then should gradually decrease. As your pain lessens it is recommended that you decrease your use of the pain medications to an "as needed basis'" only and to always comply with the recommended dosages of the pain medications.  6. Pain medications can produce constipation along with their use. If you experience this, the use of an over the counter stool softener or laxative daily is recommended.   7. For additional questions or concerns, please do not hesitate to call the office. If after hours there is an answering service to forward your concerns to the physician on call.  8.Pain control following an exparel block  To help control your post-operative pain you received a nerve block  performed with Exparel which is a long acting anesthetic (numbing agent) which can provide pain relief and sensations of numbness (and relief of pain) in the operative shoulder and arm for up to 3 days. Sometimes it provides mixed relief, meaning you may still have numbness in certain areas of the arm but can still be able to   move  parts of that arm, hand, and fingers. We recommend that your prescribed pain medications  be used as needed. We do not feel it is necessary to "pre medicate" and "stay ahead" of pain.  Taking narcotic pain medications when you are not having any pain can lead to unnecessary and potentially dangerous side effects.    9. Use the ice machine as much as possible in the first 5-7 days from surgery, then you can wean its use to as needed. The ice typically needs to be replaced every 6 hours, instead of ice you can actually freeze  water bottles to put in the cooler and then fill water around them to avoid having to purchase ice. You can have spare water bottles freezing to allow you to rotate them once they have melted. Try to have a thin shirt or light cloth or towel under the ice wrap to protect your skin.   10.  We recommend that you avoid any dental work or cleaning in the first 3 months following your joint replacement. This is to help minimize the possibility of infection from the bacteria in your mouth that enters your bloodstream during dental work. We also recommend that you take an antibiotic prior to your dental work for the first year after your shoulder replacement to further help reduce that risk. Please simply contact our office for antibiotics to be sent to your pharmacy prior to dental work.  11. Dental Antibiotics:  In most cases prophylactic antibiotics for Dental procdeures after total joint surgery are not necessary.  Exceptions are as follows:  1. History of prior total joint infection  2. Severely immunocompromised (Organ Transplant, cancer chemotherapy, Rheumatoid biologic meds such as Humera)  3. Poorly controlled diabetes (A1C &gt; 8.0, blood glucose over 200)  If you have one of these conditions, contact your surgeon for an antibiotic prescription, prior to your dental procedure.   POST-OP EXERCISES  Pendulum Exercises  Perform pendulum exercises while standing and bending at the waist. Support your uninvolved arm on a table or chair and allow your operated arm to hang freely. Make sure to do these exercises passively - not using you shoulder muscles. These exercises can be performed once your nerve block effects have worn off.  Repeat 20 times. Do 3 sessions per day.     

## 2019-09-20 NOTE — Anesthesia Postprocedure Evaluation (Signed)
Anesthesia Post Note  Patient: Anne Garrett  Procedure(s) Performed: REVERSE SHOULDER ARTHROPLASTY (Right Shoulder)     Patient location during evaluation: PACU Anesthesia Type: General Level of consciousness: sedated Pain management: pain level controlled Vital Signs Assessment: post-procedure vital signs reviewed and stable Respiratory status: spontaneous breathing and respiratory function stable Cardiovascular status: stable Postop Assessment: no apparent nausea or vomiting Anesthetic complications: yes   Encounter Complications  Complication Outcome Phase Comment  Prolonged N/V requiring intervention  In Recovery resolved in OR    Last Vitals:  Vitals:   09/20/19 1115 09/20/19 1130  BP: 96/63 112/75  Pulse: 73 88  Resp: 14 15  Temp:  36.6 C  SpO2: 95% 96%    Last Pain:  Vitals:   09/20/19 1130  TempSrc:   PainSc: 0-No pain                 Marissa Weaver DANIEL

## 2019-09-21 ENCOUNTER — Encounter (HOSPITAL_COMMUNITY): Payer: Self-pay | Admitting: Orthopedic Surgery

## 2019-09-26 MED FILL — traMADol HCL 50 MG TABS: 50 | 30 days supply | Qty: 120 | Fill #0

## 2019-09-27 ENCOUNTER — Other Ambulatory Visit: Payer: Self-pay | Admitting: Pharmacist

## 2019-09-27 MED ORDER — ACTEMRA ACTPEN 162 MG/0.9ML ~~LOC~~ SOAJ: SUBCUTANEOUS | 2 refills | Status: DC

## 2019-09-27 MED FILL — ACTEMRA ACTPEN 162 MG/0.9ML: 162 | 30 days supply | Qty: 2 | Fill #0

## 2019-10-01 DIAGNOSIS — Z96611 Presence of right artificial shoulder joint: Secondary | ICD-10-CM | POA: Diagnosis not present

## 2019-10-01 DIAGNOSIS — Z471 Aftercare following joint replacement surgery: Secondary | ICD-10-CM | POA: Diagnosis not present

## 2019-10-11 DIAGNOSIS — M06862 Other specified rheumatoid arthritis, left knee: Secondary | ICD-10-CM | POA: Diagnosis not present

## 2019-10-11 DIAGNOSIS — M25562 Pain in left knee: Secondary | ICD-10-CM | POA: Diagnosis not present

## 2019-10-11 DIAGNOSIS — M25561 Pain in right knee: Secondary | ICD-10-CM | POA: Diagnosis not present

## 2019-10-15 ENCOUNTER — Other Ambulatory Visit (HOSPITAL_COMMUNITY): Payer: Self-pay | Admitting: Orthopedic Surgery

## 2019-10-15 DIAGNOSIS — M25521 Pain in right elbow: Secondary | ICD-10-CM | POA: Diagnosis not present

## 2019-10-15 MED FILL — CYCLOBENZAPRINE HCL 10 MG T: 10 | 30 days supply | Qty: 60 | Fill #0

## 2019-10-23 MED FILL — ACTEMRA ACTPEN 162 MG/0.9ML: 162 | 30 days supply | Qty: 2 | Fill #1

## 2019-10-25 ENCOUNTER — Other Ambulatory Visit (HOSPITAL_COMMUNITY): Payer: Self-pay | Admitting: Physician Assistant

## 2019-10-25 DIAGNOSIS — M05721 Rheumatoid arthritis with rheumatoid factor of right elbow without organ or systems involvement: Secondary | ICD-10-CM | POA: Diagnosis not present

## 2019-10-25 MED FILL — traMADol HCL 50 MG TABS: 50 | 30 days supply | Qty: 120 | Fill #0

## 2019-10-29 DIAGNOSIS — Z96611 Presence of right artificial shoulder joint: Secondary | ICD-10-CM | POA: Diagnosis not present

## 2019-10-29 DIAGNOSIS — Z471 Aftercare following joint replacement surgery: Secondary | ICD-10-CM | POA: Diagnosis not present

## 2019-10-29 NOTE — Patient Instructions (Signed)
DUE TO COVID-19 ONLY ONE VISITOR IS ALLOWED TO COME WITH YOU AND STAY IN THE WAITING ROOM ONLY DURING PRE OP AND PROCEDURE DAY OF SURGERY. THE 1 VISITOR  MAY VISIT WITH YOU AFTER SURGERY IN YOUR PRIVATE ROOM DURING VISITING HOURS ONLY!  YOU NEED TO HAVE A COVID 19 TEST ON: 10/30/19 @ 2:45 PM, THIS TEST MUST BE DONE BEFORE SURGERY,  COVID TESTING SITE [place] [address], IT IS ON THE RIGHT GOING OUT [address] APPROXIMATELY  2 MINUTES PAST [employer] ON THE RIGHT. ONCE YOUR COVID TEST IS COMPLETED,  PLEASE BEGIN THE QUARANTINE INSTRUCTIONS AS OUTLINED IN YOUR HANDOUT.                Anne Garrett    Your procedure is scheduled on: 11/01/19   Report to [facility] Main  Entrance   Report to admitting at: 10:00 AM     Call this number if you have problems the morning of surgery [phone]    Remember: Do not eat solid food :After Midnight. Clear liquids from midnight until: 9:30 am.  CLEAR LIQUID DIET   Foods Allowed                                                                     Foods Excluded  Coffee and tea, regular and decaf                             liquids that you cannot  Plain Jell-O any favor except red or purple                                           see through such as: Fruit ices (not with fruit pulp)                                     milk, soups, orange juice  Iced Popsicles                                    All solid food Carbonated beverages, regular and diet                                    Cranberry, grape and apple juices Sports drinks like Gatorade Lightly seasoned clear broth or consume(fat free) Sugar, honey syrup  Sample Menu Breakfast                                Lunch                                     Supper Cranberry juice  Beef broth                            Chicken broth Jell-O                                     Grape juice                           Apple  juice Coffee or tea                        Jell-O                                      Popsicle                                                Coffee or tea                        Coffee or tea  _____________________________________________________________________  BRUSH YOUR TEETH MORNING OF SURGERY AND RINSE YOUR MOUTH OUT, NO CHEWING GUM CANDY OR MINTS.                You may not have any metal on your body including hair pins and              piercings  Do not wear jewelry, make-up, lotions, powders or perfumes, deodorant             Do not wear nail polish on your fingernails.  Do not shave  48 hours prior to surgery.    Do not bring valuables to the hospital. Anne Garrett.  Contacts, dentures or bridgework may not be worn into surgery.  Leave suitcase in the car. After surgery it may be brought to your room.     Patients discharged the day of surgery will not be allowed to drive home. IF YOU ARE HAVING SURGERY AND GOING HOME THE SAME DAY, YOU MUST HAVE AN ADULT TO DRIVE YOU HOME AND BE WITH YOU FOR 24 HOURS. YOU MAY GO HOME BY TAXI OR UBER OR ORTHERWISE, BUT AN ADULT MUST ACCOMPANY YOU HOME AND STAY WITH YOU FOR 24 HOURS.  Name and phone number of your driver:  Special Instructions: N/A              Please read over the following fact sheets you were given: _____________________________________________________________________          [facility] - Preparing for Surgery Before surgery, you can play an important role.  Because skin is not sterile, your skin needs to be as free of germs as possible.  You can reduce the number of germs on your skin by washing with CHG (chlorahexidine gluconate) soap before surgery.  CHG is an antiseptic cleaner which kills germs and bonds with the skin to continue killing germs even after washing. Please DO NOT use if you have an allergy to CHG or antibacterial soaps.  If your skin becomes reddened/irritated  stop using the CHG and inform your nurse when you arrive at Short Stay. Do not shave (including legs and underarms) for at least 48 hours prior to the first CHG shower.  You may shave your face/neck. Please follow these instructions carefully:  1.  Shower with CHG Soap the night before surgery and the  morning of Surgery.  2.  If you choose to wash your hair, wash your hair first as usual with your  normal  shampoo.  3.  After you shampoo, rinse your hair and body thoroughly to remove the  shampoo.                           4.  Use CHG as you would any other liquid soap.  You can apply chg directly  to the skin and wash                       Gently with a scrungie or clean washcloth.  5.  Apply the CHG Soap to your body ONLY FROM THE NECK DOWN.   Do not use on face/ open                           Wound or open sores. Avoid contact with eyes, ears mouth and genitals (private parts).                       Wash face,  Genitals (private parts) with your normal soap.             6.  Wash thoroughly, paying special attention to the area where your surgery  will be performed.  7.  Thoroughly rinse your body with warm water from the neck down.  8.  DO NOT shower/wash with your normal soap after using and rinsing off  the CHG Soap.                9.  Pat yourself dry with a clean towel.            10.  Wear clean pajamas.            11.  Place clean sheets on your bed the night of your first shower and do not  sleep with pets. Day of Surgery : Do not apply any lotions/deodorants the morning of surgery.  Please wear clean clothes to the hospital/surgery center.  FAILURE TO FOLLOW THESE INSTRUCTIONS MAY RESULT IN THE CANCELLATION OF YOUR SURGERY PATIENT SIGNATURE_________________________________  NURSE SIGNATURE__________________________________  ________________________________________________________________________

## 2019-10-29 NOTE — Progress Notes (Signed)
Pt. Needs orders for upcomming surgery.PST and lab. appointment on 10/30/19.Thanks.

## 2019-10-30 ENCOUNTER — Encounter (HOSPITAL_COMMUNITY)
Admission: RE | Admit: 2019-10-30 | Discharge: 2019-10-30 | Disposition: A | Discharge status: Home / Self Care | Payer: 59 | Source: Ambulatory Visit | Attending: Orthopedic Surgery | Admitting: Orthopedic Surgery

## 2019-10-30 ENCOUNTER — Encounter (HOSPITAL_COMMUNITY): Payer: Self-pay

## 2019-10-30 ENCOUNTER — Other Ambulatory Visit: Payer: Self-pay

## 2019-10-30 ENCOUNTER — Other Ambulatory Visit (HOSPITAL_COMMUNITY)
Admission: RE | Admit: 2019-10-30 | Discharge: 2019-10-30 | Disposition: A | Discharge status: Home / Self Care | Payer: 59 | Source: Ambulatory Visit | Attending: Orthopedic Surgery | Admitting: Orthopedic Surgery

## 2019-10-30 DIAGNOSIS — Z01812 Encounter for preprocedural laboratory examination: Secondary | ICD-10-CM | POA: Insufficient documentation

## 2019-10-30 DIAGNOSIS — Z20822 Contact with and (suspected) exposure to covid-19: Secondary | ICD-10-CM | POA: Insufficient documentation

## 2019-10-30 LAB — CBC
HCT: 38 % (ref 36.0–46.0)
Hemoglobin: 12.1 g/dL (ref 12.0–15.0)
MCH: 33.2 pg (ref 26.0–34.0)
MCHC: 31.8 g/dL (ref 30.0–36.0)
MCV: 104.4 fL — ABNORMAL HIGH (ref 80.0–100.0)
Platelets: 322 10*3/uL (ref 150–400)
RBC: 3.64 MIL/uL — ABNORMAL LOW (ref 3.87–5.11)
RDW: 12.9 % (ref 11.5–15.5)
WBC: 9.2 10*3/uL (ref 4.0–10.5)
nRBC: 0.2 % (ref 0.0–0.2)

## 2019-10-30 LAB — BASIC METABOLIC PANEL
Anion gap: 9 (ref 5–15)
BUN: 10 mg/dL (ref 6–20)
CO2: 26 mmol/L (ref 22–32)
Calcium: 8.5 mg/dL — ABNORMAL LOW (ref 8.9–10.3)
Chloride: 103 mmol/L (ref 98–111)
Creatinine, Ser: 0.54 mg/dL (ref 0.44–1.00)
GFR, Estimated: >60 mL/min (ref >60)
Glucose, Bld: 80 mg/dL (ref 70–99)
Potassium: 4 mmol/L (ref 3.5–5.1)
Sodium: 138 mmol/L (ref 135–145)

## 2019-10-30 LAB — SURGICAL PCR SCREEN
MRSA, PCR: NEGATIVE
Staphylococcus aureus: NEGATIVE

## 2019-10-30 LAB — SARS CORONAVIRUS 2 (TAT 6-24 HRS): SARS Coronavirus 2: NEGATIVE

## 2019-10-30 NOTE — Progress Notes (Signed)
COVID Vaccine Completed: Yes Date COVID Vaccine completed: 02/16/19. 08/29/19: Anne Garrett. COVID vaccine manufacturer: Pfizer     PCP - Dr. Billey Chang. LOV: 09/03/19 Cardiologist -   Chest x-ray -  EKG - 03/05/19 EPIC Stress Test -  ECHO -  Cardiac Cath -  Pacemaker/ICD device last checked:  Sleep Study -  CPAP -   Fasting Blood Sugar -  Checks Blood Sugar _____ times a day  Blood Thinner Instructions: Aspirin Instructions: Last Dose:  Anesthesia review:   Patient denies shortness of breath, fever, cough and chest pain at PAT appointment   Patient verbalized understanding of instructions that were given to them at the PAT appointment. Patient was also instructed that they will need to review over the PAT instructions again at home before surgery.

## 2019-10-31 DIAGNOSIS — Z79899 Other long term (current) drug therapy: Secondary | ICD-10-CM | POA: Diagnosis not present

## 2019-10-31 DIAGNOSIS — M810 Age-related osteoporosis without current pathological fracture: Secondary | ICD-10-CM | POA: Diagnosis not present

## 2019-10-31 DIAGNOSIS — M0579 Rheumatoid arthritis with rheumatoid factor of multiple sites without organ or systems involvement: Secondary | ICD-10-CM | POA: Diagnosis not present

## 2019-10-31 DIAGNOSIS — M150 Primary generalized (osteo)arthritis: Secondary | ICD-10-CM | POA: Diagnosis not present

## 2019-10-31 DIAGNOSIS — Z6823 Body mass index (BMI) 23.0-23.9, adult: Secondary | ICD-10-CM | POA: Diagnosis not present

## 2019-10-31 DIAGNOSIS — M2550 Pain in unspecified joint: Secondary | ICD-10-CM | POA: Diagnosis not present

## 2019-10-31 MED ORDER — BUPIVACAINE LIPOSOME 1.3 % IJ SUSP
20.0000 mL | Freq: Once | INTRAMUSCULAR | Status: DC
  Filled 2019-10-31: qty 20

## 2019-10-31 NOTE — Anesthesia Preprocedure Evaluation (Addendum)
Anesthesia Evaluation  Patient identified by MRN, date of birth, ID band Patient awake    Reviewed: Allergy & Precautions, NPO status , Patient's Chart, lab work & pertinent test results  Airway Mallampati: II  TM Distance: >3 FB Neck ROM: Full    Dental no notable dental hx. (+) Teeth Intact, Dental Advisory Given   Pulmonary former smoker,    Pulmonary exam normal breath sounds clear to auscultation       Cardiovascular Exercise Tolerance: Good negative cardio ROS Normal cardiovascular exam Rhythm:Regular Rate:Normal     Neuro/Psych  Headaches, negative psych ROS   GI/Hepatic Neg liver ROS,   Endo/Other  negative endocrine ROS  Renal/GU K+ 4.0 Cr 0.54     Musculoskeletal  (+) Arthritis , Rheumatoid disorders,    Abdominal   Peds  Hematology negative hematology ROS (+) hgb 12.0 Plt 322  T&S available   Anesthesia Other Findings   Reproductive/Obstetrics negative OB ROS                          Anesthesia Physical Anesthesia Plan  ASA: II  Anesthesia Plan: General   Post-op Pain Management:    Induction: Intravenous  PONV Risk Score and Plan: 4 or greater and Treatment may vary due to age or medical condition, Midazolam, Dexamethasone and Ondansetron  Airway Management Planned: Oral ETT  Additional Equipment: None  Intra-op Plan:   Post-operative Plan: Extubation in OR  Informed Consent: I have reviewed the patients History and Physical, chart, labs and discussed the procedure including the risks, benefits and alternatives for the proposed anesthesia with the patient or authorized representative who has indicated his/her understanding and acceptance.     Dental advisory given  Plan Discussed with: CRNA and Anesthesiologist  Anesthesia Plan Comments: (GA w R ISB w Exparel)       Anesthesia Quick Evaluation

## 2019-11-01 ENCOUNTER — Encounter (HOSPITAL_COMMUNITY): Payer: Self-pay | Admitting: Orthopedic Surgery

## 2019-11-01 ENCOUNTER — Other Ambulatory Visit (HOSPITAL_COMMUNITY): Payer: Self-pay | Admitting: Orthopedic Surgery

## 2019-11-01 ENCOUNTER — Encounter (HOSPITAL_COMMUNITY)
Admission: RE | Disposition: A | Discharge status: Home / Self Care | Payer: Self-pay | Source: Home / Self Care | Attending: Orthopedic Surgery

## 2019-11-01 ENCOUNTER — Ambulatory Visit (HOSPITAL_COMMUNITY)
Admission: RE | Admit: 2019-11-01 | Discharge: 2019-11-01 | Disposition: A | Discharge status: Home / Self Care | Payer: 59 | Attending: Orthopedic Surgery | Admitting: Orthopedic Surgery

## 2019-11-01 ENCOUNTER — Ambulatory Visit (HOSPITAL_COMMUNITY): Payer: 59 | Admitting: Anesthesiology

## 2019-11-01 DIAGNOSIS — Y848 Other medical procedures as the cause of abnormal reaction of the patient, or of later complication, without mention of misadventure at the time of the procedure: Secondary | ICD-10-CM | POA: Diagnosis not present

## 2019-11-01 DIAGNOSIS — M069 Rheumatoid arthritis, unspecified: Secondary | ICD-10-CM | POA: Diagnosis not present

## 2019-11-01 DIAGNOSIS — E2740 Unspecified adrenocortical insufficiency: Secondary | ICD-10-CM | POA: Diagnosis not present

## 2019-11-01 DIAGNOSIS — Z833 Family history of diabetes mellitus: Secondary | ICD-10-CM | POA: Diagnosis not present

## 2019-11-01 DIAGNOSIS — Z87891 Personal history of nicotine dependence: Secondary | ICD-10-CM | POA: Diagnosis not present

## 2019-11-01 DIAGNOSIS — Z7952 Long term (current) use of systemic steroids: Secondary | ICD-10-CM | POA: Diagnosis not present

## 2019-11-01 DIAGNOSIS — M810 Age-related osteoporosis without current pathological fracture: Secondary | ICD-10-CM | POA: Diagnosis not present

## 2019-11-01 DIAGNOSIS — K219 Gastro-esophageal reflux disease without esophagitis: Secondary | ICD-10-CM | POA: Diagnosis not present

## 2019-11-01 DIAGNOSIS — Z840 Family history of diseases of the skin and subcutaneous tissue: Secondary | ICD-10-CM | POA: Diagnosis not present

## 2019-11-01 DIAGNOSIS — Y798 Miscellaneous orthopedic devices associated with adverse incidents, not elsewhere classified: Secondary | ICD-10-CM | POA: Diagnosis not present

## 2019-11-01 DIAGNOSIS — G43909 Migraine, unspecified, not intractable, without status migrainosus: Secondary | ICD-10-CM | POA: Diagnosis not present

## 2019-11-01 DIAGNOSIS — Z820 Family history of epilepsy and other diseases of the nervous system: Secondary | ICD-10-CM | POA: Diagnosis not present

## 2019-11-01 DIAGNOSIS — Z96611 Presence of right artificial shoulder joint: Secondary | ICD-10-CM | POA: Diagnosis not present

## 2019-11-01 DIAGNOSIS — T84028A Dislocation of other internal joint prosthesis, initial encounter: Secondary | ICD-10-CM | POA: Diagnosis not present

## 2019-11-01 DIAGNOSIS — M25311 Other instability, right shoulder: Secondary | ICD-10-CM | POA: Diagnosis not present

## 2019-11-01 DIAGNOSIS — G8918 Other acute postprocedural pain: Secondary | ICD-10-CM | POA: Diagnosis not present

## 2019-11-01 DIAGNOSIS — Z803 Family history of malignant neoplasm of breast: Secondary | ICD-10-CM | POA: Diagnosis not present

## 2019-11-01 DIAGNOSIS — Z9049 Acquired absence of other specified parts of digestive tract: Secondary | ICD-10-CM | POA: Insufficient documentation

## 2019-11-01 DIAGNOSIS — Z79899 Other long term (current) drug therapy: Secondary | ICD-10-CM | POA: Insufficient documentation

## 2019-11-01 DIAGNOSIS — Z841 Family history of disorders of kidney and ureter: Secondary | ICD-10-CM | POA: Insufficient documentation

## 2019-11-01 DIAGNOSIS — M4802 Spinal stenosis, cervical region: Secondary | ICD-10-CM | POA: Diagnosis not present

## 2019-11-01 DIAGNOSIS — Z981 Arthrodesis status: Secondary | ICD-10-CM | POA: Insufficient documentation

## 2019-11-01 DIAGNOSIS — Z96612 Presence of left artificial shoulder joint: Secondary | ICD-10-CM | POA: Insufficient documentation

## 2019-11-01 HISTORY — PX: REVISION TOTAL SHOULDER TO REVERSE TOTAL SHOULDER: SHX6313

## 2019-11-01 LAB — TYPE AND SCREEN
ABO/RH(D): A POS
Antibody Screen: NEGATIVE

## 2019-11-01 LAB — PREGNANCY, URINE: Preg Test, Ur: NEGATIVE

## 2019-11-01 SURGERY — REVISION, REVERSE TOTAL ARTHROPLASTY, SHOULDER
Anesthesia: General | Site: Shoulder | Laterality: Right

## 2019-11-01 MED ORDER — BUPIVACAINE LIPOSOME 1.3 % IJ SUSP
INTRAMUSCULAR | Status: DC | PRN
  Administered 2019-11-01: 10 mL via PERINEURAL

## 2019-11-01 MED ORDER — POVIDONE-IODINE 10 % EX SWAB
2.0000 "application " | Freq: Once | CUTANEOUS | Status: AC
  Administered 2019-11-01: 2 via TOPICAL

## 2019-11-01 MED ORDER — ONDANSETRON HCL 4 MG/2ML IJ SOLN
INTRAMUSCULAR | Status: AC
  Filled 2019-11-01: qty 2

## 2019-11-01 MED ORDER — PROPOFOL 10 MG/ML IV BOLUS
INTRAVENOUS | Status: AC
  Filled 2019-11-01: qty 20

## 2019-11-01 MED ORDER — LACTATED RINGERS IV BOLUS: 250.0000 mL | Freq: Once | INTRAVENOUS | Status: DC

## 2019-11-01 MED ORDER — LACTATED RINGERS IV BOLUS
500.0000 mL | Freq: Once | INTRAVENOUS | Status: AC
  Administered 2019-11-01: 500 mL via INTRAVENOUS

## 2019-11-01 MED ORDER — TRANEXAMIC ACID-NACL 1000-0.7 MG/100ML-% IV SOLN
1000.0000 mg | INTRAVENOUS | Status: AC
  Administered 2019-11-01: 1000 mg via INTRAVENOUS
  Filled 2019-11-01: qty 100

## 2019-11-01 MED ORDER — CYCLOBENZAPRINE HCL 10 MG PO TABS
10.0000 mg | ORAL_TABLET | Freq: Three times a day (TID) | ORAL | 1 refills | Status: DC | PRN
Start: 2019-11-01 — End: 2020-02-15

## 2019-11-01 MED ORDER — DEXAMETHASONE SODIUM PHOSPHATE 10 MG/ML IJ SOLN: 8.0000 mg | Freq: Once | INTRAMUSCULAR | Status: DC

## 2019-11-01 MED ORDER — ONDANSETRON HCL 4 MG PO TABS: 4.0000 mg | ORAL_TABLET | Freq: Three times a day (TID) | ORAL | 0 refills | Status: DC | PRN

## 2019-11-01 MED ORDER — CEFAZOLIN SODIUM-DEXTROSE 2-4 GM/100ML-% IV SOLN
2.0000 g | INTRAVENOUS | Status: AC
  Administered 2019-11-01: 2 g via INTRAVENOUS
  Filled 2019-11-01: qty 100

## 2019-11-01 MED ORDER — LACTATED RINGERS IV SOLN: INTRAVENOUS | Status: DC

## 2019-11-01 MED ORDER — ROCURONIUM BROMIDE 10 MG/ML (PF) SYRINGE
PREFILLED_SYRINGE | INTRAVENOUS | Status: AC
  Filled 2019-11-01: qty 20

## 2019-11-01 MED ORDER — CHLORHEXIDINE GLUCONATE 0.12 % MT SOLN
15.0000 mL | Freq: Once | OROMUCOSAL | Status: AC
  Administered 2019-11-01: 15 mL via OROMUCOSAL

## 2019-11-01 MED ORDER — KETOROLAC TROMETHAMINE 30 MG/ML IJ SOLN: 30.0000 mg | Freq: Once | INTRAMUSCULAR | Status: DC | PRN

## 2019-11-01 MED ORDER — PHENYLEPHRINE 40 MCG/ML (10ML) SYRINGE FOR IV PUSH (FOR BLOOD PRESSURE SUPPORT)
PREFILLED_SYRINGE | INTRAVENOUS | Status: DC | PRN
  Administered 2019-11-01 (×2): 80 ug via INTRAVENOUS

## 2019-11-01 MED ORDER — STERILE WATER FOR IRRIGATION IR SOLN
Status: DC | PRN
  Administered 2019-11-01: 2000 mL

## 2019-11-01 MED ORDER — OXYCODONE HCL 5 MG PO TABS: 5.0000 mg | ORAL_TABLET | Freq: Once | ORAL | Status: DC | PRN

## 2019-11-01 MED ORDER — FENTANYL CITRATE (PF) 100 MCG/2ML IJ SOLN
50.0000 ug | INTRAMUSCULAR | Status: DC
  Administered 2019-11-01: 50 ug via INTRAVENOUS
  Filled 2019-11-01: qty 2

## 2019-11-01 MED ORDER — ORAL CARE MOUTH RINSE: 15.0000 mL | Freq: Once | OROMUCOSAL | Status: AC

## 2019-11-01 MED ORDER — BUPIVACAINE-EPINEPHRINE (PF) 0.5% -1:200000 IJ SOLN
INTRAMUSCULAR | Status: DC | PRN
  Administered 2019-11-01: 12 mL via PERINEURAL

## 2019-11-01 MED ORDER — ROCURONIUM BROMIDE 10 MG/ML (PF) SYRINGE
PREFILLED_SYRINGE | INTRAVENOUS | Status: DC | PRN
  Administered 2019-11-01: 40 mg via INTRAVENOUS
  Administered 2019-11-01: 30 mg via INTRAVENOUS

## 2019-11-01 MED ORDER — DEXAMETHASONE SODIUM PHOSPHATE 10 MG/ML IJ SOLN
INTRAMUSCULAR | Status: DC | PRN
  Administered 2019-11-01: 10 mg via INTRAVENOUS

## 2019-11-01 MED ORDER — MIDAZOLAM HCL 2 MG/2ML IJ SOLN
1.0000 mg | INTRAMUSCULAR | Status: DC
  Administered 2019-11-01: 1 mg via INTRAVENOUS
  Filled 2019-11-01: qty 2

## 2019-11-01 MED ORDER — OXYCODONE-ACETAMINOPHEN 5-325 MG PO TABS
1.0000 | ORAL_TABLET | ORAL | 0 refills | Status: DC | PRN
Start: 2019-11-01 — End: 2019-11-23

## 2019-11-01 MED ORDER — FENTANYL CITRATE (PF) 100 MCG/2ML IJ SOLN
INTRAMUSCULAR | Status: DC | PRN
  Administered 2019-11-01 (×2): 50 ug via INTRAVENOUS

## 2019-11-01 MED ORDER — ACETAMINOPHEN 10 MG/ML IV SOLN
1000.0000 mg | Freq: Four times a day (QID) | INTRAVENOUS | Status: DC
  Administered 2019-11-01: 1000 mg via INTRAVENOUS
  Filled 2019-11-01: qty 100

## 2019-11-01 MED ORDER — SUGAMMADEX SODIUM 200 MG/2ML IV SOLN
INTRAVENOUS | Status: DC | PRN
  Administered 2019-11-01: 200 mg via INTRAVENOUS

## 2019-11-01 MED ORDER — TRANEXAMIC ACID-NACL 1000-0.7 MG/100ML-% IV SOLN: 1000.0000 mg | INTRAVENOUS | Status: DC

## 2019-11-01 MED ORDER — OXYCODONE HCL 5 MG/5ML PO SOLN: 5.0000 mg | Freq: Once | ORAL | Status: DC | PRN

## 2019-11-01 MED ORDER — 0.9 % SODIUM CHLORIDE (POUR BTL) OPTIME
TOPICAL | Status: DC | PRN
  Administered 2019-11-01: 1000 mL

## 2019-11-01 MED ORDER — VANCOMYCIN HCL 1000 MG IV SOLR
INTRAVENOUS | Status: AC
  Filled 2019-11-01: qty 1000

## 2019-11-01 MED ORDER — HYDROMORPHONE HCL 1 MG/ML IJ SOLN: 0.2500 mg | INTRAMUSCULAR | Status: DC | PRN

## 2019-11-01 MED ORDER — DEXAMETHASONE SODIUM PHOSPHATE 10 MG/ML IJ SOLN
INTRAMUSCULAR | Status: AC
  Filled 2019-11-01: qty 1

## 2019-11-01 MED ORDER — ONDANSETRON HCL 4 MG/2ML IJ SOLN
4.0000 mg | Freq: Once | INTRAMUSCULAR | Status: AC | PRN
  Administered 2019-11-01: 4 mg via INTRAVENOUS

## 2019-11-01 MED ORDER — FENTANYL CITRATE (PF) 100 MCG/2ML IJ SOLN
INTRAMUSCULAR | Status: AC
  Filled 2019-11-01: qty 2

## 2019-11-01 MED ORDER — VANCOMYCIN HCL 1000 MG IV SOLR
INTRAVENOUS | Status: DC | PRN
  Administered 2019-11-01: 1000 mg via TOPICAL

## 2019-11-01 MED ORDER — LIDOCAINE 2% (20 MG/ML) 5 ML SYRINGE
INTRAMUSCULAR | Status: AC
  Filled 2019-11-01: qty 5

## 2019-11-01 MED ORDER — CEFAZOLIN SODIUM-DEXTROSE 2-4 GM/100ML-% IV SOLN: 2.0000 g | INTRAVENOUS | Status: DC

## 2019-11-01 MED ORDER — LIDOCAINE 2% (20 MG/ML) 5 ML SYRINGE
INTRAMUSCULAR | Status: DC | PRN
  Administered 2019-11-01: 60 mg via INTRAVENOUS

## 2019-11-01 MED ORDER — PROPOFOL 10 MG/ML IV BOLUS
INTRAVENOUS | Status: DC | PRN
  Administered 2019-11-01: 150 mg via INTRAVENOUS

## 2019-11-01 MED FILL — OXYCODONE-APAP 5-325MG: 5-325 | 3 days supply | Qty: 20 | Fill #0

## 2019-11-01 MED FILL — ONDANSETRON HCL 4 MG TABS: 4 | 3 days supply | Qty: 10 | Fill #0

## 2019-11-01 MED FILL — CYCLOBENZAPRINE HCL 10 MG T: 10 | 10 days supply | Qty: 30 | Fill #0

## 2019-11-01 SURGICAL SUPPLY — 67 items
BAG ZIPLOCK 12X15 (MISCELLANEOUS) ×2 IMPLANT
BLADE SAW SGTL 83.5X18.5 (BLADE) ×2 IMPLANT
COVER BACK TABLE 60X90IN (DRAPES) ×2 IMPLANT
COVER SURGICAL LIGHT HANDLE (MISCELLANEOUS) ×2 IMPLANT
COVER WAND RF STERILE (DRAPES) IMPLANT
CUP SUT UNIV REVERS 36 NEUTRAL (Cup) ×2 IMPLANT
DERMABOND ADVANCED (GAUZE/BANDAGES/DRESSINGS) ×1
DERMABOND ADVANCED .7 DNX12 (GAUZE/BANDAGES/DRESSINGS) ×1 IMPLANT
DRAPE INCISE IOBAN 66X45 STRL (DRAPES) IMPLANT
DRAPE ORTHO SPLIT 77X108 STRL (DRAPES) ×2
DRAPE SHEET LG 3/4 BI-LAMINATE (DRAPES) ×2 IMPLANT
DRAPE SURG 17X11 SM STRL (DRAPES) ×2 IMPLANT
DRAPE SURG ORHT 6 SPLT 77X108 (DRAPES) ×2 IMPLANT
DRAPE TOP 10253 STERILE (DRAPES) ×2 IMPLANT
DRAPE U-SHAPE 47X51 STRL (DRAPES) ×2 IMPLANT
DRESSING AQUACEL AG SP 3.5X10 (GAUZE/BANDAGES/DRESSINGS) ×1 IMPLANT
DRESSING AQUACEL AG SP 3.5X6 (GAUZE/BANDAGES/DRESSINGS) ×1 IMPLANT
DRSG AQUACEL AG ADV 3.5X 6 (GAUZE/BANDAGES/DRESSINGS) ×2 IMPLANT
DRSG AQUACEL AG ADV 3.5X10 (GAUZE/BANDAGES/DRESSINGS) ×2 IMPLANT
DRSG AQUACEL AG SP 3.5X10 (GAUZE/BANDAGES/DRESSINGS) ×2
DRSG AQUACEL AG SP 3.5X6 (GAUZE/BANDAGES/DRESSINGS) ×2
DURAPREP 26ML APPLICATOR (WOUND CARE) ×2 IMPLANT
ELECT BLADE TIP CTD 4 INCH (ELECTRODE) ×2 IMPLANT
ELECT REM PT RETURN 15FT ADLT (MISCELLANEOUS) ×2 IMPLANT
FACESHIELD WRAPAROUND (MASK) ×8 IMPLANT
GLOVE BIO SURGEON STRL SZ7.5 (GLOVE) ×2 IMPLANT
GLOVE BIO SURGEON STRL SZ8 (GLOVE) ×2 IMPLANT
GLOVE SS BIOGEL STRL SZ 7 (GLOVE) ×1 IMPLANT
GLOVE SS BIOGEL STRL SZ 7.5 (GLOVE) ×1 IMPLANT
GLOVE SUPERSENSE BIOGEL SZ 7 (GLOVE) ×1
GLOVE SUPERSENSE BIOGEL SZ 7.5 (GLOVE) ×1
GLOVE SURG SYN 7.0 (GLOVE) IMPLANT
GLOVE SURG SYN 7.5  E (GLOVE)
GLOVE SURG SYN 7.5 E (GLOVE) IMPLANT
GLOVE SURG SYN 8.0 (GLOVE) IMPLANT
GOWN STRL REUS W/TWL LRG LVL3 (GOWN DISPOSABLE) ×4 IMPLANT
INSERT HUMERAL 36 +6 (Shoulder) ×2 IMPLANT
KIT BASIN OR (CUSTOM PROCEDURE TRAY) ×2 IMPLANT
KIT TURNOVER KIT A (KITS) IMPLANT
MANIFOLD NEPTUNE II (INSTRUMENTS) ×2 IMPLANT
NEEDLE TAPERED W/ NITINOL LOOP (MISCELLANEOUS) ×2 IMPLANT
NS IRRIG 1000ML POUR BTL (IV SOLUTION) ×2 IMPLANT
PACK SHOULDER (CUSTOM PROCEDURE TRAY) ×2 IMPLANT
PAD ARMBOARD 7.5X6 YLW CONV (MISCELLANEOUS) ×2 IMPLANT
PAD COLD SHLDR WRAP-ON (PAD) ×2 IMPLANT
PIN NITINOL TARGETER 2.8 (PIN) IMPLANT
PIN SET MODULAR GLENOID SYSTEM (PIN) ×2 IMPLANT
RESTRAINT HEAD UNIVERSAL NS (MISCELLANEOUS) ×2 IMPLANT
SLING ARM FOAM STRAP LRG (SOFTGOODS) IMPLANT
SLING ARM FOAM STRAP MED (SOFTGOODS) IMPLANT
SLING ARM FOAM STRAP SML (SOFTGOODS) ×2 IMPLANT
SPONGE LAP 18X18 RF (DISPOSABLE) IMPLANT
STEM HUMERAL UNI REVERS SZ9 (Stem) ×2 IMPLANT
STEM HUMERAL UNIVERS SZ8 (Stem) ×2 IMPLANT
SUCTION FRAZIER HANDLE 12FR (TUBING) ×1
SUCTION TUBE FRAZIER 12FR DISP (TUBING) ×1 IMPLANT
SUT FIBERTAPE CERCLAGE 2 48 (SUTURE) ×2 IMPLANT
SUT FIBERWIRE #2 38 T-5 BLUE (SUTURE)
SUT MNCRL AB 3-0 PS2 18 (SUTURE) ×2 IMPLANT
SUT MON AB 2-0 CT1 36 (SUTURE) ×2 IMPLANT
SUT VIC AB 1 CT1 36 (SUTURE) ×2 IMPLANT
SUTURE FIBERWR #2 38 T-5 BLUE (SUTURE) IMPLANT
SUTURE TAPE 1.3 40 TPR END (SUTURE) ×2 IMPLANT
SUTURETAPE 1.3 40 TPR END (SUTURE) ×4
TOWEL OR 17X26 10 PK STRL BLUE (TOWEL DISPOSABLE) ×2 IMPLANT
TOWEL OR NON WOVEN STRL DISP B (DISPOSABLE) ×2 IMPLANT
WATER STERILE IRR 1000ML POUR (IV SOLUTION) ×4 IMPLANT

## 2019-11-01 NOTE — Anesthesia Procedure Notes (Signed)
Anesthesia Regional Block: Interscalene brachial plexus block   Pre-Anesthetic Checklist: ,, timeout performed, Correct Patient, Correct Site, Correct Laterality, Correct Procedure, Correct Position, site marked, Risks and benefits discussed,  Surgical consent,  Pre-op evaluation,  At surgeon's request and post-op pain management  Laterality: Upper and Right  Prep: Maximum Sterile Barrier Precautions used, chloraprep       Needles:  Injection technique: Single-shot  Needle Type: Echogenic Needle     Needle Length: 5cm  Needle Gauge: 21     Additional Needles:   Procedures:,,,, ultrasound used (permanent image in chart),,,,  Narrative:  Start time: 11/01/2019 11:55 AM End time: 11/01/2019 12:05 PM Injection made incrementally with aspirations every 5 mL.  Performed by: Personally  Anesthesiologist: Barnet Glasgow, MD  Additional Notes: Block assessed prior to procedure. Patient tolerated procedure well.

## 2019-11-01 NOTE — Anesthesia Procedure Notes (Signed)
Procedure Name: Intubation Date/Time: 11/01/2019 1:57 PM Performed by: Gerald Leitz, CRNA Pre-anesthesia Checklist: Patient identified, Patient being monitored, Timeout performed, Emergency Drugs available and Suction available Patient Re-evaluated:Patient Re-evaluated prior to induction Oxygen Delivery Method: Circle system utilized Preoxygenation: Pre-oxygenation with 100% oxygen Induction Type: IV induction Ventilation: Mask ventilation without difficulty Laryngoscope Size: Mac and 3 Grade View: Grade II Tube type: Oral Tube size: 7.0 mm Number of attempts: 2 Airway Equipment and Method: Stylet and Video-laryngoscopy Placement Confirmation: ETT inserted through vocal cords under direct vision,  positive ETCO2 and breath sounds checked- equal and bilateral Secured at: 20 cm Tube secured with: Tape Dental Injury: Teeth and Oropharynx as per pre-operative assessment  Difficulty Due To: Difficult Airway- due to anterior larynx and Difficult Airway- due to immobile epiglottis

## 2019-11-01 NOTE — H&P (Signed)
Anne Garrett    Chief Complaint: Leim Fabry right reverse total shoulder HPI: The patient is a 44 y.o. female with an extensive past medical and surgical history including long-term rheumatoid arthritis, status post a right shoulder reverse arthroplasty in September 2021.  Postoperative she is unfortunately continued to have difficulties with right shoulder pain with sensations of instability.  Her most recent x-rays demonstrate that her humeral stem has significantly subsided within the humeral canal.  Concern is that this was related to osteoporosis versus periprosthetic fracture allowing migration of the implant.  Now with her significant pain and instability she is brought to the operating this time for planned revision of the humeral stem of her right shoulder reverse arthroplasty.  Past Medical History:  Diagnosis Date  . Adrenal insufficiency ([place])    secondary to Prednisone  . Eczema    coner oif eye lid  . Headache    Migraine  . Intervertebral disk disease   . Juvenile idiopathic arthritis ([place])   . Osteoporosis   . Rheumatoid arthritis ([place])    Juvenile onset    Past Surgical History:  Procedure Laterality Date  . ANKLE ARTHROPLASTY Right   . ANTERIOR CERVICAL DECOMP/DISCECTOMY FUSION N/A 06/19/2015   Procedure: C3-4 C4-5 Anterior cervical decompression/diskectomy/fusion;  Surgeon: Jovita Gamma, MD;  Location: [facility];  Service: Neurosurgery;  Laterality: N/A;  C3-4 C4-5 Anterior cervical decompression/diskectomy/fusion  . APPENDECTOMY    . CARPAL TUNNEL RELEASE Right   . CESAREAN SECTION    . CHOLECYSTECTOMY    . CHOLECYSTECTOMY  2006  . COLONOSCOPY    . ESSURE TUBAL LIGATION     Essure Implant-Permanent birth control  . excision to palm Left    excision of mass to right plam  . FOOT ARTHRODESIS Left 03/01/2019   Procedure: Left talonavicular and naviculocuneiform arthrodesis;  Surgeon: [name], MD;  Location: [place] [facility];  Service:  Orthopedics;  Laterality: Left;  . HARDWARE REMOVAL Right 02/02/2017   Procedure: RIGHT WRIST DEEP IMPLANT REMOVAL, RIGHT WRIST AND HAND EXTENSOR TENDON RECONSTRUCTION, TENDON TRANSFER AND RECONSTRUCTION AS INDICATED;  Surgeon: Iran Planas, MD;  Location: [facility];  Service: Orthopedics;  Laterality: Right;  . JOINT REPLACEMENT Bilateral    bilat hip replacements  . ORIF FEMUR FRACTURE Right   . OTHER SURGICAL HISTORY  06/04/2016   Revision of PLIF  . plif l5-s1 arthrodeisis    2018  . POSTERIOR CERVICAL FUSION/FORAMINOTOMY N/A 07/12/2015   Procedure: C2 to C5 Cervical laminectomy, C2 to C5 posterior cervical arthrodesis with instrumentation and bone graft;  Surgeon: Jovita Gamma, MD;  Location: [facility];  Service: Neurosurgery;  Laterality: N/A;  C2 to C5 Cervical laminectomy, C2  to C5 posterior cervical arthrodesis with instrumentation and bone graft  . REPAIR EXTENSOR TENDON Right 02/02/2017   Procedure: REPAIR EXTENSOR TENDON;  Surgeon: Iran Planas, MD;  Location: [place];  Service: Orthopedics;  Laterality: Right;  . REVERSE SHOULDER ARTHROPLASTY Left 09/07/2018   Procedure: REVERSE SHOULDER ARTHROPLASTY;  Surgeon: [name], MD;  Location: [facility];  Service: Orthopedics;  Laterality: Left;  . REVERSE SHOULDER ARTHROPLASTY Right 09/20/2019   Procedure: REVERSE SHOULDER ARTHROPLASTY;  Surgeon: [name], MD;  Location: [facility];  Service: Orthopedics;  Laterality: Right;  128min  . right foot surgery Right    right jfoot arthrodesis and right ankle arthroplasty  . WRIST SURGERY Bilateral    arthrodesis    Family History  Problem Relation Age of Onset  . Breast cancer Mother  71  . Nephritis Father   . Epilepsy Father   . Breast cancer Maternal Aunt        2 mat aunts   . Multiple sclerosis Maternal Uncle   . Psoriasis Paternal Aunt   . Diabetes Daughter     Social History:  reports that she quit smoking about 4 years ago. Her smoking use included cigarettes. She has a 5.00  pack-year smoking history. She has never used smokeless tobacco. She reports current alcohol use of about 2.0 standard drinks of alcohol per week. She reports that she does not use drugs.   Medications Prior to Admission  Medication Sig Dispense Refill  . Biotin w/ Vitamins C & E (HAIR/SKIN/NAILS PO) Take 1 tablet by mouth daily.    . Butalbital-APAP-Caffeine 50-325-40 MG capsule Take 1 capsule by mouth daily as needed. (Patient taking differently: Take 1 capsule by mouth daily as needed (Migraines). ) 30 capsule 0  . Calcium Carb-Cholecalciferol (CALCIUM+D3 PO) Take 1 tablet by mouth daily.    . Cholecalciferol (VITAMIN D3) 50 MCG (2000 UT) TABS Take 2,000 Units by mouth daily.    . cyclobenzaprine (FLEXERIL) 10 MG tablet Take 10 mg by mouth 2 (two) times daily as needed for pain.    . Multiple Vitamin (MULTIVITAMIN WITH MINERALS) TABS tablet Take 1 tablet by mouth daily.    Marland Kitchen OVER THE COUNTER MEDICATION Take 1 capsule by mouth daily. Juice plus Omega/Vegetable    . predniSONE (DELTASONE) 5 MG tablet Take 5 mg by mouth every evening.     Marland Kitchen PRESCRIPTION MEDICATION Apply 1 application topically See admin instructions. Apply dime-size amount to face 1-2 TIMES per day  Metronidazole 1% + Ivermectin 1% + Azelaic Acid 15%    . Tocilizumab (ACTEMRA ACTPEN) 162 MG/0.9ML SOAJ Inject 0.54ml subcutaneous every other week 30 days. (Patient taking differently: Inject 0.9 mLs into the skin every 14 (fourteen) days. Inject 0.41ml subcutaneous every other week 30 days.) 1.8 mL 2  . traMADol (ULTRAM) 50 MG tablet Take 100 mg by mouth 2 (two) times daily.     . metroNIDAZOLE (METROCREAM) 0.75 % cream Apply topically 2 (two) times daily. (Patient not taking: Reported on 10/29/2019) 45 g 11  . ondansetron (ZOFRAN) 4 MG tablet Take 1 tablet (4 mg total) by mouth every 8 (eight) hours as needed for nausea or vomiting. (Patient not taking: Reported on 10/29/2019) 20 tablet 0  . rizatriptan (MAXALT-MLT) 10 MG  disintegrating tablet Take 1 tablet (10 mg total) by mouth daily as needed for migraine. May repeat in 2 hours if needed 10 tablet 2  . triamcinolone cream (KENALOG) 0.1 % Apply 1 application topically 2 (two) times daily as needed. (Patient taking differently: Apply 1 application topically 2 (two) times daily as needed (eczema). ) 15 g 1     Physical Exam: Inspection of the right shoulder demonstrates that her previous surgical incision is nicely healed.  There is no erythema or induration.  She has limited active elevation achieving only approximately 30 degrees of elevation and forward flexion and abduction.  Manipulation of the shoulder demonstrates obvious laxity and subluxation of the implant.  Plain radiographs compared to her initial postop films demonstrate that there has been subsidence of the stem.  Vitals  Temp:  [99.6 F (37.6 C)] 99.6 F (37.6 C) (10/28 1107) Pulse Rate:  [91-106] 102 (10/28 1210) Resp:  [9-22] 20 (10/28 1210) BP: (109-136)/(67-84) 116/68 (10/28 1210) SpO2:  [100 %] 100 % (10/28 1210)  Assessment/Plan  Impression: Untable right reverse total shoulder  Plan of Action: Procedure(s): revision right total shoulder to reverse total shoulder  Tehran Rabenold M Zamani Crocker 11/01/2019, 12:50 PM Contact # [phone]

## 2019-11-01 NOTE — Discharge Instructions (Signed)
 Kevin M. Supple, M.D., F.A.A.O.S. Orthopaedic Surgery Specializing in Arthroscopic and Reconstructive Surgery of the Shoulder [phone] [address] - [place], [place] [address] - Fax [phone]   POST-OP TOTAL SHOULDER REPLACEMENT INSTRUCTIONS  1. Follow up in the office for your first post-op appointment 10-14 days from the date of your surgery. If you do not already have a scheduled appointment, our office will contact you to schedule.  2. The bandage over your incision is waterproof. You may begin showering with this dressing on. You may leave this dressing on until first follow up appointment within 2 weeks. We prefer you leave this dressing in place until follow up however after 5-7 days if you are having itching or skin irritation and would like to remove it you may do so. Go slow and tug at the borders gently to break the bond the dressing has with the skin. At this point if there is no drainage it is okay to go without a bandage or you may cover it with a light guaze and tape. You can also expect significant bruising around your shoulder that will drift down your arm and into your chest wall. This is very normal and should resolve over several days.   3. Wear your sling/immobilizer at all times except to perform the exercises below or to occasionally let your arm dangle by your side to stretch your elbow. You also need to sleep in your sling immobilizer until instructed otherwise. It is ok to remove your sling if you are sitting in a controlled environment and allow your arm to rest in a position of comfort by your side or on your lap with pillows to give your neck and skin a break from the sling. You may remove it to allow arm to dangle by side to shower. If you are up walking around and when you go to sleep at night you need to wear it.  4. Range of motion to your elbow, wrist, and hand are encouraged 3-5 times daily. Exercise to your hand and fingers helps to reduce  swelling you may experience.   5. Prescriptions for a pain medication and a muscle relaxant are provided for you. It is recommended that if you are experiencing pain that you pain medication alone is not controlling, add the muscle relaxant along with the pain medication which can give additional pain relief. The first 1-2 days is generally the most severe of your pain and then should gradually decrease. As your pain lessens it is recommended that you decrease your use of the pain medications to an "as needed basis'" only and to always comply with the recommended dosages of the pain medications.  6. Pain medications can produce constipation along with their use. If you experience this, the use of an over the counter stool softener or laxative daily is recommended.   7. For additional questions or concerns, please do not hesitate to call the office. If after hours there is an answering service to forward your concerns to the physician on call.  8.Pain control following an exparel block  To help control your post-operative pain you received a nerve block  performed with Exparel which is a long acting anesthetic (numbing agent) which can provide pain relief and sensations of numbness (and relief of pain) in the operative shoulder and arm for up to 3 days. Sometimes it provides mixed relief, meaning you may still have numbness in certain areas of the arm but can still be able to   move  parts of that arm, hand, and fingers. We recommend that your prescribed pain medications  be used as needed. We do not feel it is necessary to "pre medicate" and "stay ahead" of pain.  Taking narcotic pain medications when you are not having any pain can lead to unnecessary and potentially dangerous side effects.    9. Use the ice machine as much as possible in the first 5-7 days from surgery, then you can wean its use to as needed. The ice typically needs to be replaced every 6 hours, instead of ice you can actually freeze  water bottles to put in the cooler and then fill water around them to avoid having to purchase ice. You can have spare water bottles freezing to allow you to rotate them once they have melted. Try to have a thin shirt or light cloth or towel under the ice wrap to protect your skin.   10.  We recommend that you avoid any dental work or cleaning in the first 3 months following your joint replacement. This is to help minimize the possibility of infection from the bacteria in your mouth that enters your bloodstream during dental work. We also recommend that you take an antibiotic prior to your dental work for the first year after your shoulder replacement to further help reduce that risk. Please simply contact our office for antibiotics to be sent to your pharmacy prior to dental work.  11. Dental Antibiotics:  In most cases prophylactic antibiotics for Dental procdeures after total joint surgery are not necessary.  Exceptions are as follows:  1. History of prior total joint infection  2. Severely immunocompromised (Organ Transplant, cancer chemotherapy, Rheumatoid biologic meds such as Humera)  3. Poorly controlled diabetes (A1C &gt; 8.0, blood glucose over 200)  If you have one of these conditions, contact your surgeon for an antibiotic prescription, prior to your dental procedure.   POST-OP EXERCISES  Pendulum Exercises  Perform pendulum exercises while standing and bending at the waist. Support your uninvolved arm on a table or chair and allow your operated arm to hang freely. Make sure to do these exercises passively - not using you shoulder muscles. These exercises can be performed once your nerve block effects have worn off.  Repeat 20 times. Do 3 sessions per day.     

## 2019-11-01 NOTE — Progress Notes (Signed)
AssistedDr. Houser with right, ultrasound guided, interscalene  block. Side rails up, monitors on throughout procedure. See vital signs in flow sheet. Tolerated Procedure well.  

## 2019-11-01 NOTE — Anesthesia Postprocedure Evaluation (Signed)
Anesthesia Post Note  Patient: Anne Garrett  Procedure(s) Performed: revision right total shoulder to reverse total shoulder (Right Shoulder)     Patient location during evaluation: PACU Anesthesia Type: General and Regional Level of consciousness: awake and alert Pain management: pain level controlled Vital Signs Assessment: post-procedure vital signs reviewed and stable Respiratory status: spontaneous breathing, nonlabored ventilation, respiratory function stable and patient connected to nasal cannula oxygen Cardiovascular status: blood pressure returned to baseline and stable Postop Assessment: no apparent nausea or vomiting Anesthetic complications: no   No complications documented.  Last Vitals:  Vitals:   11/01/19 1630 11/01/19 1700  BP: 115/71 110/72  Pulse: 88 90  Resp: 17 18  Temp:  36.9 C  SpO2: 96% 97%    Last Pain:  Vitals:   11/01/19 1700  TempSrc:   PainSc: 0-No pain                 Barnet Glasgow

## 2019-11-01 NOTE — Transfer of Care (Signed)
Immediate Anesthesia Transfer of Care Note  Patient: Anne Garrett  Procedure(s) Performed: Procedure(s) with comments: revision right total shoulder to reverse total shoulder (Right) - exparel block  Patient Location: PACU  Anesthesia Type:General  Level of Consciousness: Alert, Awake, Oriented  Airway & Oxygen Therapy: Patient Spontanous Breathing  Post-op Assessment: Report given to RN  Post vital signs: Reviewed and stable  Last Vitals:  Vitals:   11/01/19 1205 11/01/19 1210  BP: 119/67 116/68  Pulse: 94 (!) 102  Resp: (!) 22 20  Temp:    SpO2: 161% 096%    Complications: No apparent anesthesia complications

## 2019-11-01 NOTE — Op Note (Signed)
11/01/2019  3:45 PM  PATIENT:   Anne Garrett  44 y.o. female  PRE-OPERATIVE DIAGNOSIS:  Unstable right reverse total shoulder  POST-OPERATIVE DIAGNOSIS: Same  PROCEDURE: Revision of the humeral stem of an unstable right shoulder reverse arthroplasty with removal of the previously placed size 5.5 stem and conversion to a size 8 stem with a +6 polyethylene insert.  SURGEON:  Marin Shutter M.D.  ASSISTANTS: Jenetta Loges, PA-C  ANESTHESIA:   General endotracheal and interscalene block with Exparel  EBL: 200 cc  SPECIMEN: None  Drains: None   PATIENT DISPOSITION:  PACU - hemodynamically stable.    PLAN OF CARE: Discharge to home after PACU  Brief history:  Patient is a 44 year old female with a known long history of juvenile rheumatoid arthritis, status post multiple orthopedic procedures for the treatment of her joint damage.  She is status post an index right shoulder reverse arthroplasty back in September of this year.  She initially did well but then developed persistent pain with failure to increase her overall active motion and developed sensations of instability.  On evaluation in the office her incision was well-healed.  She did however show evidence of instability of her implant with subluxation being elicited during manipulation.  Her plain radiographs most recently showed that her humeral head had subsided and created laxity of the implant.  Due to her ongoing significant pain and instability of the implant she is brought to the operating room at this time for planned revision surgery.  Preoperatively I counseled the patient regarding treatment options as well as the potential risks versus benefits thereof.  Possible surgical complications were reviewed including bleeding, infection, neurovascular injury, persistent pain, loss of motion, failure of the implant, anesthetic complication, and possible need for additional surgery.  She understands, and accepts, and  agrees to the plan procedure.  Procedure in detail:  After undergoing routine preop evaluation the patient received prophylactic antibiotics and interscalene block with Exparel was established in the holding area by the anesthesia department.  Patient subsequently placed supine on the operating table and underwent the smooth induction of a general endotracheal anesthesia using the glide scope.  Placed into the beachchair position and appropriately padded and protected.  The right shoulder girdle region was sterilely prepped and draped in standard fashion.  Timeout was called.  Through her previous anterior shoulder incision we made a repeat incision also extended this proximally and distally such that the entire incision was approximately 12 cm in length.  Dissection carried deeply and electrocautery was used for hemostasis.  The skin flaps are elevated.  The deltopectoral interval was not easily identified and essentially I dissection between the pectoralis and deltoid was required using combinations of sharp and blunt dissection.  Ultimately the deltoid was elevated and adhesions were freed beneath it such that we could gain access to the proximal humerus and the metaphysis of the implant.  The pectoralis was retracted medially and the conjoined tendon was identified and retracted medially.  We then performed subperiosteal dissection around the humeral metaphysis and identified that her implant had indeed subsided and was grossly loose.  We were able to dislocate the implant and remove it from the humeral canal without any difficulty.  We noted that our previously placed cerclage tape had loosened and there had been a further fracture of the medial calcar.  Suture tape was removed.  We then assessed the overall alignment and position and proceeded with a reaming of the humeral canal with the hopes  that a larger standard length stem with the appropriate providing sufficient fixation.  We performed sequential  reaming and broaching and after a number different trials and options were considered we did ultimately hand reaming with a size 9 and impacted a size 8 trial which ultimately gave good soft tissue balance good motion and good stability.  We had considered going with a longstem revision prosthesis but this would have required extensive reaming of the humeral canal which we had hoped to avoid.  Ultimately with broaching and reaming were able to gain a combination and good stability with a size 8 stem.  We did initially open a size 9 implant but this clearly was oversized and would not seat appropriately so size 8 stem was then selected the final implant was assembled we did place vancomycin powder into the canal and then terminally seated the implant at 20 degrees of retroversion and good excellent stability fit and fixation.  We then performed a series of trial reductions and ultimately felt that the +6 polygave [place] the best soft tissue balance.  The trial was removed the final 6 polywas impacted final reduction was then performed.  The shoulder showed good motion good stability good soft tissue balance.  The joint was copiously irrigated.  We did confirm that the glenosphere was stable and properly seated.  The deltopectoral interval was then reapproximated with a series of figure-of-eight and 1 Vicryl sutures.  2-0 Vicryl used for subcu layer and intracuticular 3-0 Monocryl for the skin followed by Dermabond and Aquacel dressing.  Right arm was then placed into a sling and the patient was then awakened, extubated, and taken to recovery room in stable condition.  Jenetta Loges, PA-C was utilized as an Environmental consultant throughout this case, essential for help with positioning the patient, positioning extremity, tissue manipulation, implantation of the prosthesis, suture management, wound closure, and intraoperative decision-making.  Marin Shutter MD   Contact # [phone]

## 2019-11-02 ENCOUNTER — Encounter (HOSPITAL_COMMUNITY): Payer: Self-pay | Admitting: Orthopedic Surgery

## 2019-11-05 ENCOUNTER — Other Ambulatory Visit: Payer: Self-pay | Admitting: Pharmacist

## 2019-11-05 MED ORDER — ACTEMRA ACTPEN 162 MG/0.9ML ~~LOC~~ SOAJ
SUBCUTANEOUS | 2 refills | Status: DC
Start: 2019-11-05 — End: 2019-11-05

## 2019-11-07 ENCOUNTER — Other Ambulatory Visit (HOSPITAL_COMMUNITY): Payer: Self-pay | Admitting: Orthopedic Surgery

## 2019-11-07 DIAGNOSIS — Z96611 Presence of right artificial shoulder joint: Secondary | ICD-10-CM | POA: Diagnosis not present

## 2019-11-07 MED FILL — CEPHALEXIN 500 MG CAPSULE: 500 | 7 days supply | Qty: 28 | Fill #0

## 2019-11-12 ENCOUNTER — Encounter (HOSPITAL_COMMUNITY): Payer: Self-pay | Admitting: Orthopedic Surgery

## 2019-11-12 ENCOUNTER — Other Ambulatory Visit (HOSPITAL_COMMUNITY)
Admission: RE | Admit: 2019-11-12 | Discharge: 2019-11-12 | Disposition: A | Discharge status: Home / Self Care | Payer: 59 | Source: Ambulatory Visit | Attending: Orthopedic Surgery | Admitting: Orthopedic Surgery

## 2019-11-12 DIAGNOSIS — Z01818 Encounter for other preprocedural examination: Secondary | ICD-10-CM | POA: Diagnosis not present

## 2019-11-12 DIAGNOSIS — Z20822 Contact with and (suspected) exposure to covid-19: Secondary | ICD-10-CM | POA: Insufficient documentation

## 2019-11-12 LAB — SARS CORONAVIRUS 2 (TAT 6-24 HRS): SARS Coronavirus 2: NEGATIVE

## 2019-11-12 NOTE — Progress Notes (Signed)
Spoke with pt for pre-op call. Pt just had surgery end of October on same shoulder. No history of HTN, Diabetes or cardiac.   Covid test done today, result pending.  Pt states she's been in quarantine since the test was done and understands that she stays in quarantine until he comes to the hospital tomorrow.

## 2019-11-13 ENCOUNTER — Encounter (HOSPITAL_COMMUNITY): Payer: Self-pay | Admitting: Orthopedic Surgery

## 2019-11-13 ENCOUNTER — Encounter (HOSPITAL_COMMUNITY)
Admission: RE | Disposition: A | Discharge status: Home / Self Care | Payer: Self-pay | Source: Home / Self Care | Attending: Orthopedic Surgery

## 2019-11-13 ENCOUNTER — Ambulatory Visit (HOSPITAL_COMMUNITY): Payer: 59 | Admitting: Anesthesiology

## 2019-11-13 ENCOUNTER — Other Ambulatory Visit: Payer: Self-pay

## 2019-11-13 ENCOUNTER — Observation Stay (HOSPITAL_COMMUNITY)
Admission: RE | Admit: 2019-11-13 | Discharge: 2019-11-14 | Disposition: A | Discharge status: Home / Self Care | Payer: 59 | Attending: Orthopedic Surgery | Admitting: Orthopedic Surgery

## 2019-11-13 DIAGNOSIS — S41001A Unspecified open wound of right shoulder, initial encounter: Secondary | ICD-10-CM | POA: Diagnosis not present

## 2019-11-13 DIAGNOSIS — G8918 Other acute postprocedural pain: Secondary | ICD-10-CM | POA: Diagnosis not present

## 2019-11-13 DIAGNOSIS — Z79899 Other long term (current) drug therapy: Secondary | ICD-10-CM | POA: Diagnosis not present

## 2019-11-13 DIAGNOSIS — S41001D Unspecified open wound of right shoulder, subsequent encounter: Principal | ICD-10-CM | POA: Insufficient documentation

## 2019-11-13 DIAGNOSIS — G43909 Migraine, unspecified, not intractable, without status migrainosus: Secondary | ICD-10-CM | POA: Diagnosis not present

## 2019-11-13 DIAGNOSIS — Z87891 Personal history of nicotine dependence: Secondary | ICD-10-CM | POA: Diagnosis not present

## 2019-11-13 DIAGNOSIS — X58XXXD Exposure to other specified factors, subsequent encounter: Secondary | ICD-10-CM | POA: Insufficient documentation

## 2019-11-13 DIAGNOSIS — M069 Rheumatoid arthritis, unspecified: Secondary | ICD-10-CM | POA: Diagnosis not present

## 2019-11-13 DIAGNOSIS — M810 Age-related osteoporosis without current pathological fracture: Secondary | ICD-10-CM | POA: Diagnosis not present

## 2019-11-13 DIAGNOSIS — Z9889 Other specified postprocedural states: Secondary | ICD-10-CM

## 2019-11-13 HISTORY — PX: IRRIGATION AND DEBRIDEMENT SHOULDER: SHX5880

## 2019-11-13 LAB — POCT I-STAT EG7
Acid-Base Excess: 0 mmol/L (ref 0.0–2.0)
Bicarbonate: 26.6 mmol/L (ref 20.0–28.0)
Calcium, Ion: 1.22 mmol/L (ref 1.15–1.40)
HCT: 28 % — ABNORMAL LOW (ref 36.0–46.0)
Hemoglobin: 9.5 g/dL — ABNORMAL LOW (ref 12.0–15.0)
O2 Saturation: 85 %
Potassium: 3.3 mmol/L — ABNORMAL LOW (ref 3.5–5.1)
Sodium: 142 mmol/L (ref 135–145)
TCO2: 28 mmol/L (ref 22–32)
pCO2, Ven: 54.8 mmHg (ref 44.0–60.0)
pH, Ven: 7.294 (ref 7.250–7.430)
pO2, Ven: 56 mmHg — ABNORMAL HIGH (ref 32.0–45.0)

## 2019-11-13 LAB — CBC
HCT: 29.9 % — ABNORMAL LOW (ref 36.0–46.0)
Hemoglobin: 8.9 g/dL — ABNORMAL LOW (ref 12.0–15.0)
MCH: 30.7 pg (ref 26.0–34.0)
MCHC: 29.8 g/dL — ABNORMAL LOW (ref 30.0–36.0)
MCV: 103.1 fL — ABNORMAL HIGH (ref 80.0–100.0)
Platelets: 319 10*3/uL (ref 150–400)
RBC: 2.9 MIL/uL — ABNORMAL LOW (ref 3.87–5.11)
RDW: 12.5 % (ref 11.5–15.5)
WBC: 6.9 10*3/uL (ref 4.0–10.5)
nRBC: 0 % (ref 0.0–0.2)

## 2019-11-13 SURGERY — IRRIGATION AND DEBRIDEMENT SHOULDER
Anesthesia: General | Site: Shoulder | Laterality: Right

## 2019-11-13 MED ORDER — CEFAZOLIN SODIUM-DEXTROSE 1-4 GM/50ML-% IV SOLN
1.0000 g | Freq: Four times a day (QID) | INTRAVENOUS | Status: AC
  Administered 2019-11-13 – 2019-11-14 (×3): 1 g via INTRAVENOUS
  Filled 2019-11-13 (×3): qty 50

## 2019-11-13 MED ORDER — ACETAMINOPHEN 160 MG/5ML PO SOLN: 325.0000 mg | ORAL | Status: DC | PRN

## 2019-11-13 MED ORDER — ONDANSETRON HCL 4 MG/2ML IJ SOLN
INTRAMUSCULAR | Status: AC
  Filled 2019-11-13: qty 2

## 2019-11-13 MED ORDER — FENTANYL CITRATE (PF) 100 MCG/2ML IJ SOLN
INTRAMUSCULAR | Status: DC | PRN
  Administered 2019-11-13: 50 ug via INTRAVENOUS
  Administered 2019-11-13: 25 ug via INTRAVENOUS

## 2019-11-13 MED ORDER — TRANEXAMIC ACID-NACL 1000-0.7 MG/100ML-% IV SOLN
1000.0000 mg | INTRAVENOUS | Status: AC
  Administered 2019-11-13: 1000 mg via INTRAVENOUS
  Filled 2019-11-13: qty 100

## 2019-11-13 MED ORDER — BUPIVACAINE-EPINEPHRINE (PF) 0.5% -1:200000 IJ SOLN
INTRAMUSCULAR | Status: DC | PRN
  Administered 2019-11-13: 10 mL via PERINEURAL

## 2019-11-13 MED ORDER — PROPOFOL 10 MG/ML IV BOLUS
INTRAVENOUS | Status: DC | PRN
  Administered 2019-11-13: 170 mg via INTRAVENOUS

## 2019-11-13 MED ORDER — ONDANSETRON HCL 4 MG PO TABS: 4.0000 mg | ORAL_TABLET | Freq: Four times a day (QID) | ORAL | Status: DC | PRN

## 2019-11-13 MED ORDER — PHENOL 1.4 % MT LIQD: 1.0000 | OROMUCOSAL | Status: DC | PRN

## 2019-11-13 MED ORDER — HEMOSTATIC AGENTS (NO CHARGE) OPTIME
TOPICAL | Status: DC | PRN
  Administered 2019-11-13: 1 via TOPICAL

## 2019-11-13 MED ORDER — ONDANSETRON HCL 4 MG/2ML IJ SOLN
INTRAMUSCULAR | Status: DC | PRN
  Administered 2019-11-13: 4 mg via INTRAVENOUS

## 2019-11-13 MED ORDER — PROTAMINE SULFATE 10 MG/ML IV SOLN
INTRAVENOUS | Status: AC
  Filled 2019-11-13: qty 5

## 2019-11-13 MED ORDER — DEXAMETHASONE SODIUM PHOSPHATE 10 MG/ML IJ SOLN
INTRAMUSCULAR | Status: AC
  Filled 2019-11-13: qty 1

## 2019-11-13 MED ORDER — TRANEXAMIC ACID-NACL 1000-0.7 MG/100ML-% IV SOLN
INTRAVENOUS | Status: AC
  Filled 2019-11-13: qty 100

## 2019-11-13 MED ORDER — DOCUSATE SODIUM 100 MG PO CAPS
100.0000 mg | ORAL_CAPSULE | Freq: Two times a day (BID) | ORAL | Status: DC
  Administered 2019-11-13 – 2019-11-14 (×2): 100 mg via ORAL
  Filled 2019-11-13 (×2): qty 1

## 2019-11-13 MED ORDER — OXYCODONE HCL 5 MG PO TABS: 10.0000 mg | ORAL_TABLET | ORAL | Status: DC | PRN

## 2019-11-13 MED ORDER — METOCLOPRAMIDE HCL 5 MG/ML IJ SOLN: 5.0000 mg | Freq: Three times a day (TID) | INTRAMUSCULAR | Status: DC | PRN

## 2019-11-13 MED ORDER — TRANEXAMIC ACID 1000 MG/10ML IV SOLN
2000.0000 mg | INTRAVENOUS | Status: AC
  Administered 2019-11-13: 2000 mg via TOPICAL
  Filled 2019-11-13: qty 20

## 2019-11-13 MED ORDER — LIDOCAINE 2% (20 MG/ML) 5 ML SYRINGE
INTRAMUSCULAR | Status: AC
  Filled 2019-11-13: qty 5

## 2019-11-13 MED ORDER — FENTANYL CITRATE (PF) 100 MCG/2ML IJ SOLN
25.0000 ug | INTRAMUSCULAR | Status: DC | PRN
  Administered 2019-11-13 (×2): 50 ug via INTRAVENOUS

## 2019-11-13 MED ORDER — FENTANYL CITRATE (PF) 100 MCG/2ML IJ SOLN
INTRAMUSCULAR | Status: AC
  Filled 2019-11-13: qty 2

## 2019-11-13 MED ORDER — MIDAZOLAM HCL 2 MG/2ML IJ SOLN
INTRAMUSCULAR | Status: AC
  Filled 2019-11-13: qty 2

## 2019-11-13 MED ORDER — FENTANYL CITRATE (PF) 100 MCG/2ML IJ SOLN: 50.0000 ug | Freq: Once | INTRAMUSCULAR | Status: AC

## 2019-11-13 MED ORDER — OXYCODONE HCL 5 MG PO TABS
5.0000 mg | ORAL_TABLET | ORAL | Status: DC | PRN
  Administered 2019-11-13 – 2019-11-14 (×2): 5 mg via ORAL
  Filled 2019-11-13 (×2): qty 1

## 2019-11-13 MED ORDER — ONDANSETRON HCL 4 MG/2ML IJ SOLN: 4.0000 mg | Freq: Once | INTRAMUSCULAR | Status: DC | PRN

## 2019-11-13 MED ORDER — METOCLOPRAMIDE HCL 5 MG PO TABS: 5.0000 mg | ORAL_TABLET | Freq: Three times a day (TID) | ORAL | Status: DC | PRN

## 2019-11-13 MED ORDER — LIDOCAINE 2% (20 MG/ML) 5 ML SYRINGE
INTRAMUSCULAR | Status: DC | PRN
  Administered 2019-11-13: 60 mg via INTRAVENOUS

## 2019-11-13 MED ORDER — METHOCARBAMOL 500 MG PO TABS
500.0000 mg | ORAL_TABLET | Freq: Four times a day (QID) | ORAL | Status: DC | PRN
  Administered 2019-11-14: 500 mg via ORAL
  Filled 2019-11-13: qty 1

## 2019-11-13 MED ORDER — MAGNESIUM CITRATE PO SOLN: 1.0000 | Freq: Once | ORAL | Status: DC | PRN

## 2019-11-13 MED ORDER — ONDANSETRON HCL 4 MG/2ML IJ SOLN: 4.0000 mg | Freq: Four times a day (QID) | INTRAMUSCULAR | Status: DC | PRN

## 2019-11-13 MED ORDER — OXYCODONE HCL 5 MG/5ML PO SOLN: 5.0000 mg | Freq: Once | ORAL | Status: DC | PRN

## 2019-11-13 MED ORDER — METHOCARBAMOL 1000 MG/10ML IJ SOLN
500.0000 mg | Freq: Four times a day (QID) | INTRAVENOUS | Status: DC | PRN
  Filled 2019-11-13: qty 5

## 2019-11-13 MED ORDER — MIDAZOLAM HCL 2 MG/2ML IJ SOLN
INTRAMUSCULAR | Status: AC
  Administered 2019-11-13: 2 mg via INTRAVENOUS
  Filled 2019-11-13: qty 2

## 2019-11-13 MED ORDER — ACETAMINOPHEN 325 MG PO TABS: 325.0000 mg | ORAL_TABLET | Freq: Four times a day (QID) | ORAL | Status: DC | PRN

## 2019-11-13 MED ORDER — MENTHOL 3 MG MT LOZG: 1.0000 | LOZENGE | OROMUCOSAL | Status: DC | PRN

## 2019-11-13 MED ORDER — DEXAMETHASONE SODIUM PHOSPHATE 10 MG/ML IJ SOLN
INTRAMUSCULAR | Status: DC | PRN
  Administered 2019-11-13: 10 mg via INTRAVENOUS

## 2019-11-13 MED ORDER — 0.9 % SODIUM CHLORIDE (POUR BTL) OPTIME
TOPICAL | Status: DC | PRN
  Administered 2019-11-13: 1000 mL

## 2019-11-13 MED ORDER — LACTATED RINGERS IV SOLN: INTRAVENOUS | Status: DC

## 2019-11-13 MED ORDER — MEPERIDINE HCL 25 MG/ML IJ SOLN: 6.2500 mg | INTRAMUSCULAR | Status: DC | PRN

## 2019-11-13 MED ORDER — DIPHENHYDRAMINE HCL 12.5 MG/5ML PO ELIX: 12.5000 mg | ORAL_SOLUTION | ORAL | Status: DC | PRN

## 2019-11-13 MED ORDER — PROPOFOL 10 MG/ML IV BOLUS
INTRAVENOUS | Status: AC
  Filled 2019-11-13: qty 20

## 2019-11-13 MED ORDER — FENTANYL CITRATE (PF) 250 MCG/5ML IJ SOLN
INTRAMUSCULAR | Status: AC
  Filled 2019-11-13: qty 5

## 2019-11-13 MED ORDER — ALUM & MAG HYDROXIDE-SIMETH 200-200-20 MG/5ML PO SUSP: 30.0000 mL | ORAL | Status: DC | PRN

## 2019-11-13 MED ORDER — HYDROMORPHONE HCL 1 MG/ML IJ SOLN: 0.5000 mg | INTRAMUSCULAR | Status: DC | PRN

## 2019-11-13 MED ORDER — ROCURONIUM BROMIDE 10 MG/ML (PF) SYRINGE
PREFILLED_SYRINGE | INTRAVENOUS | Status: AC
  Filled 2019-11-13: qty 10

## 2019-11-13 MED ORDER — ACETAMINOPHEN 325 MG PO TABS: 325.0000 mg | ORAL_TABLET | ORAL | Status: DC | PRN

## 2019-11-13 MED ORDER — CEFAZOLIN SODIUM-DEXTROSE 2-4 GM/100ML-% IV SOLN
2.0000 g | INTRAVENOUS | Status: AC
  Administered 2019-11-13: 2 g via INTRAVENOUS
  Filled 2019-11-13: qty 100

## 2019-11-13 MED ORDER — TEMAZEPAM 15 MG PO CAPS: 15.0000 mg | ORAL_CAPSULE | Freq: Every evening | ORAL | Status: DC | PRN

## 2019-11-13 MED ORDER — MIDAZOLAM HCL 2 MG/2ML IJ SOLN: 2.0000 mg | Freq: Once | INTRAMUSCULAR | Status: AC

## 2019-11-13 MED ORDER — CHLORHEXIDINE GLUCONATE 0.12 % MT SOLN
15.0000 mL | Freq: Once | OROMUCOSAL | Status: AC
  Administered 2019-11-13: 15 mL via OROMUCOSAL

## 2019-11-13 MED ORDER — FENTANYL CITRATE (PF) 100 MCG/2ML IJ SOLN
INTRAMUSCULAR | Status: AC
  Administered 2019-11-13: 50 ug via INTRAVENOUS
  Filled 2019-11-13: qty 2

## 2019-11-13 MED ORDER — BUPIVACAINE LIPOSOME 1.3 % IJ SUSP
INTRAMUSCULAR | Status: DC | PRN
  Administered 2019-11-13: 10 mL via PERINEURAL

## 2019-11-13 MED ORDER — BISACODYL 5 MG PO TBEC: 5.0000 mg | DELAYED_RELEASE_TABLET | Freq: Every day | ORAL | Status: DC | PRN

## 2019-11-13 MED ORDER — HEPARIN SODIUM (PORCINE) 1000 UNIT/ML IJ SOLN
INTRAMUSCULAR | Status: AC
  Filled 2019-11-13: qty 1

## 2019-11-13 MED ORDER — PHENYLEPHRINE 40 MCG/ML (10ML) SYRINGE FOR IV PUSH (FOR BLOOD PRESSURE SUPPORT)
PREFILLED_SYRINGE | INTRAVENOUS | Status: DC | PRN
  Administered 2019-11-13: 120 ug via INTRAVENOUS

## 2019-11-13 MED ORDER — ORAL CARE MOUTH RINSE: 15.0000 mL | Freq: Once | OROMUCOSAL | Status: AC

## 2019-11-13 MED ORDER — OXYCODONE HCL 5 MG PO TABS: 5.0000 mg | ORAL_TABLET | Freq: Once | ORAL | Status: DC | PRN

## 2019-11-13 MED ORDER — SODIUM CHLORIDE 0.9 % IR SOLN
Status: DC | PRN
  Administered 2019-11-13: 3000 mL

## 2019-11-13 MED ORDER — PANTOPRAZOLE SODIUM 40 MG PO TBEC
40.0000 mg | DELAYED_RELEASE_TABLET | Freq: Every day | ORAL | Status: DC
  Administered 2019-11-13: 40 mg via ORAL
  Filled 2019-11-13 (×2): qty 1

## 2019-11-13 MED ORDER — PREDNISONE 5 MG PO TABS
5.0000 mg | ORAL_TABLET | Freq: Every evening | ORAL | Status: DC
  Administered 2019-11-13: 5 mg via ORAL
  Filled 2019-11-13: qty 1

## 2019-11-13 MED ORDER — TRANEXAMIC ACID 1000 MG/10ML IV SOLN
2000.0000 mg | INTRAVENOUS | Status: DC
  Filled 2019-11-13: qty 20

## 2019-11-13 MED ORDER — POLYETHYLENE GLYCOL 3350 17 G PO PACK: 17.0000 g | PACK | Freq: Every day | ORAL | Status: DC | PRN

## 2019-11-13 SURGICAL SUPPLY — 72 items
COVER SURGICAL LIGHT HANDLE (MISCELLANEOUS) ×2 IMPLANT
COVER WAND RF STERILE (DRAPES) ×2 IMPLANT
DRAPE INCISE IOBAN 66X45 STRL (DRAPES) ×2 IMPLANT
DRAPE ORTHO SPLIT 77X108 STRL (DRAPES) ×1
DRAPE SURG 17X11 SM STRL (DRAPES) ×2 IMPLANT
DRAPE SURG ORHT 6 SPLT 77X108 (DRAPES) ×1 IMPLANT
DRAPE U-SHAPE 47X51 STRL (DRAPES) ×2 IMPLANT
DRSG AQUACEL AG ADV 3.5X10 (GAUZE/BANDAGES/DRESSINGS) ×2 IMPLANT
DRSG PAD ABDOMINAL 8X10 ST (GAUZE/BANDAGES/DRESSINGS) ×2 IMPLANT
DRSG TEGADERM 4X4.75 (GAUZE/BANDAGES/DRESSINGS) ×2 IMPLANT
DURAPREP 26ML APPLICATOR (WOUND CARE) ×2 IMPLANT
ELECT BLADE 4.0 EZ CLEAN MEGAD (MISCELLANEOUS) ×2
ELECT CAUTERY BLADE 6.4 (BLADE) ×2 IMPLANT
ELECT REM PT RETURN 9FT ADLT (ELECTROSURGICAL) ×2
ELECTRODE BLDE 4.0 EZ CLN MEGD (MISCELLANEOUS) ×1 IMPLANT
ELECTRODE REM PT RTRN 9FT ADLT (ELECTROSURGICAL) ×1 IMPLANT
EVACUATOR 1/8 PVC DRAIN (DRAIN) ×2 IMPLANT
GAUZE SPONGE 4X4 12PLY STRL (GAUZE/BANDAGES/DRESSINGS) ×2 IMPLANT
GLOVE BIO SURGEON STRL SZ7.5 (GLOVE) ×2 IMPLANT
GLOVE BIO SURGEON STRL SZ8 (GLOVE) ×4 IMPLANT
GLOVE BIOGEL PI IND STRL 6.5 (GLOVE) ×1 IMPLANT
GLOVE BIOGEL PI INDICATOR 6.5 (GLOVE) ×1
GLOVE ECLIPSE 6.5 STRL STRAW (GLOVE) ×2 IMPLANT
GLOVE SS BIOGEL STRL SZ 7 (GLOVE) ×1 IMPLANT
GLOVE SS BIOGEL STRL SZ 7.5 (GLOVE) ×2 IMPLANT
GLOVE SUPERSENSE BIOGEL SZ 7 (GLOVE) ×1
GLOVE SUPERSENSE BIOGEL SZ 7.5 (GLOVE) ×2
GOWN STRL REUS W/ TWL LRG LVL3 (GOWN DISPOSABLE) ×2 IMPLANT
GOWN STRL REUS W/ TWL XL LVL3 (GOWN DISPOSABLE) ×2 IMPLANT
GOWN STRL REUS W/TWL LRG LVL3 (GOWN DISPOSABLE) ×2
GOWN STRL REUS W/TWL XL LVL3 (GOWN DISPOSABLE) ×2
HANDPIECE INTERPULSE COAX TIP (DISPOSABLE) ×1
HEMOSTAT HEMOBLAST BELLOWS (HEMOSTASIS) ×2 IMPLANT
IV NS IRRIG 3000ML ARTHROMATIC (IV SOLUTION) ×2 IMPLANT
KIT BASIN OR (CUSTOM PROCEDURE TRAY) ×2 IMPLANT
KIT TURNOVER KIT B (KITS) ×2 IMPLANT
MANIFOLD NEPTUNE II (INSTRUMENTS) ×2 IMPLANT
NEEDLE 1/2 CIR CATGUT .05X1.09 (NEEDLE) IMPLANT
NEEDLE HYPO 25GX1X1/2 BEV (NEEDLE) IMPLANT
NS IRRIG 1000ML POUR BTL (IV SOLUTION) ×2 IMPLANT
PACK SHOULDER (CUSTOM PROCEDURE TRAY) ×2 IMPLANT
PAD ARMBOARD 7.5X6 YLW CONV (MISCELLANEOUS) ×4 IMPLANT
SET HNDPC FAN SPRY TIP SCT (DISPOSABLE) ×1 IMPLANT
SLING ARM FOAM STRAP SML (SOFTGOODS) ×2 IMPLANT
SLING ARM IMMOBILIZER LRG (SOFTGOODS) ×2 IMPLANT
SPONGE LAP 18X18 RF (DISPOSABLE) ×2 IMPLANT
SPONGE LAP 4X18 RFD (DISPOSABLE) ×4 IMPLANT
STRIP CLOSURE SKIN 1/2X4 (GAUZE/BANDAGES/DRESSINGS) ×2 IMPLANT
SUCTION FRAZIER HANDLE 10FR (MISCELLANEOUS) ×1
SUCTION TUBE FRAZIER 10FR DISP (MISCELLANEOUS) ×1 IMPLANT
SUT ETHILON 2 0 FS 18 (SUTURE) ×4 IMPLANT
SUT ETHILON 2 0 PSLX (SUTURE) ×2 IMPLANT
SUT FIBERWIRE #2 38 T-5 BLUE (SUTURE)
SUT MNCRL AB 3-0 PS2 18 (SUTURE) ×2 IMPLANT
SUT MON AB 2-0 CT1 27 (SUTURE) IMPLANT
SUT MON AB 2-0 CT1 36 (SUTURE) ×2 IMPLANT
SUT PROLENE 0 CT 1 30 (SUTURE) ×2 IMPLANT
SUT VIC AB 1 CT1 27 (SUTURE) ×2
SUT VIC AB 1 CT1 27XBRD ANBCTR (SUTURE) ×2 IMPLANT
SUT VIC AB 1 CTX 27 (SUTURE) ×2 IMPLANT
SUT VIC AB 2-0 CT1 27 (SUTURE) ×2
SUT VIC AB 2-0 CT1 TAPERPNT 27 (SUTURE) ×2 IMPLANT
SUT VIC AB 2-0 SH 27 (SUTURE)
SUT VIC AB 2-0 SH 27X BRD (SUTURE) IMPLANT
SUTURE FIBERWR #2 38 T-5 BLUE (SUTURE) IMPLANT
SWAB COLLECTION DEVICE MRSA (MISCELLANEOUS) ×2 IMPLANT
SWAB CULTURE ESWAB REG 1ML (MISCELLANEOUS) ×2 IMPLANT
SYR 20ML LL LF (SYRINGE) ×2 IMPLANT
SYR CONTROL 10ML LL (SYRINGE) IMPLANT
TOWEL GREEN STERILE (TOWEL DISPOSABLE) ×2 IMPLANT
TOWEL GREEN STERILE FF (TOWEL DISPOSABLE) ×2 IMPLANT
WATER STERILE IRR 1000ML POUR (IV SOLUTION) ×2 IMPLANT

## 2019-11-13 NOTE — H&P (Signed)
Anne Garrett    Chief Complaint: Right shoulder wound drainage HPI: The patient is a 44 y.o. female status post revision of a right shoulder reverse arthroplasty back on 11/01/2019.  Initially did well postoperatively but subsequent developed persistent bloody drainage from the mid aspect of the incision which was felt to represent a decompressing postoperative hematoma.  Unfortunately drainage has persisted and patient is now brought to the operating for planned I&D of the wound with primary closure.  Past Medical History:  Diagnosis Date  . Adrenal insufficiency ([place])    secondary to Prednisone  . Eczema    coner oif eye lid  . Headache    Migraine  . Intervertebral disk disease   . Juvenile idiopathic arthritis ([place])   . Osteoporosis   . Rheumatoid arthritis ([place])    Juvenile onset    Past Surgical History:  Procedure Laterality Date  . ANKLE ARTHROPLASTY Right   . ANTERIOR CERVICAL DECOMP/DISCECTOMY FUSION N/A 06/19/2015   Procedure: C3-4 C4-5 Anterior cervical decompression/diskectomy/fusion;  Surgeon: Jovita Gamma, MD;  Location: [facility];  Service: Neurosurgery;  Laterality: N/A;  C3-4 C4-5 Anterior cervical decompression/diskectomy/fusion  . APPENDECTOMY    . CARPAL TUNNEL RELEASE Right   . CESAREAN SECTION    . CHOLECYSTECTOMY    . CHOLECYSTECTOMY  2006  . COLONOSCOPY    . ESSURE TUBAL LIGATION     Essure Implant-Permanent birth control  . excision to palm Left    excision of mass to right plam  . FOOT ARTHRODESIS Left 03/01/2019   Procedure: Left talonavicular and naviculocuneiform arthrodesis;  Surgeon: [name], MD;  Location: [facility];  Service: Orthopedics;  Laterality: Left;  . HARDWARE REMOVAL Right 02/02/2017   Procedure: RIGHT WRIST DEEP IMPLANT REMOVAL, RIGHT WRIST AND HAND EXTENSOR TENDON RECONSTRUCTION, TENDON TRANSFER AND RECONSTRUCTION AS INDICATED;  Surgeon: Iran Planas, MD;  Location: [facility];  Service: Orthopedics;   Laterality: Right;  . JOINT REPLACEMENT Bilateral    bilat hip replacements  . ORIF FEMUR FRACTURE Right   . OTHER SURGICAL HISTORY  06/04/2016   Revision of PLIF  . plif l5-s1 arthrodeisis    2018  . POSTERIOR CERVICAL FUSION/FORAMINOTOMY N/A 07/12/2015   Procedure: C2 to C5 Cervical laminectomy, C2 to C5 posterior cervical arthrodesis with instrumentation and bone graft;  Surgeon: Jovita Gamma, MD;  Location: [facility];  Service: Neurosurgery;  Laterality: N/A;  C2 to C5 Cervical laminectomy, C2  to C5 posterior cervical arthrodesis with instrumentation and bone graft  . REPAIR EXTENSOR TENDON Right 02/02/2017   Procedure: REPAIR EXTENSOR TENDON;  Surgeon: Iran Planas, MD;  Location: [facility];  Service: Orthopedics;  Laterality: Right;  . REVERSE SHOULDER ARTHROPLASTY Left 09/07/2018   Procedure: REVERSE SHOULDER ARTHROPLASTY;  Surgeon: [name], MD;  Location: [facility];  Service: Orthopedics;  Laterality: Left;  . REVERSE SHOULDER ARTHROPLASTY Right 09/20/2019   Procedure: REVERSE SHOULDER ARTHROPLASTY;  Surgeon: [name], MD;  Location: [facility];  Service: Orthopedics;  Laterality: Right;  162min  . REVISION TOTAL SHOULDER TO REVERSE TOTAL SHOULDER Right 11/01/2019   Procedure: revision right total shoulder to reverse total shoulder;  Surgeon: [name], MD;  Location: [facility];  Service: Orthopedics;  Laterality: Right;  exparel block  . right foot surgery Right    right jfoot arthrodesis and right ankle arthroplasty  . WRIST SURGERY Bilateral    arthrodesis    Family History  Problem Relation Age of Onset  . Breast cancer Mother 57  .  Nephritis Father   . Epilepsy Father   . Breast cancer Maternal Aunt        2 mat aunts   . Multiple sclerosis Maternal Uncle   . Psoriasis Paternal Aunt   . Diabetes Daughter     Social History:  reports that she quit smoking about 4 years ago. Her smoking use included cigarettes. She has a 5.00 pack-year smoking history. She has never  used smokeless tobacco. She reports current alcohol use of about 2.0 standard drinks of alcohol per week. She reports that she does not use drugs.   Medications Prior to Admission  Medication Sig Dispense Refill  . Biotin w/ Vitamins C & E (HAIR/SKIN/NAILS PO) Take 1 tablet by mouth daily.    . Calcium Carb-Cholecalciferol (CALCIUM+D3 PO) Take 1 tablet by mouth daily.    . Cholecalciferol (VITAMIN D3) 50 MCG (2000 UT) TABS Take 2,000 Units by mouth daily.    . cyclobenzaprine (FLEXERIL) 10 MG tablet Take 1 tablet (10 mg total) by mouth 3 (three) times daily as needed. 30 tablet 1  . Multiple Vitamin (MULTIVITAMIN WITH MINERALS) TABS tablet Take 1 tablet by mouth daily.    Marland Kitchen OVER THE COUNTER MEDICATION Take 1 capsule by mouth daily. Juice plus Omega/Vegetable    . oxyCODONE-acetaminophen (PERCOCET) 5-325 MG tablet Take 1 tablet by mouth every 4 (four) hours as needed (max 6 q). 20 tablet 0  . predniSONE (DELTASONE) 5 MG tablet Take 5 mg by mouth every evening.     Marland Kitchen PRESCRIPTION MEDICATION Apply 1 application topically See admin instructions. Apply dime-size amount to face 1-2 TIMES per day  Metronidazole 1% + Ivermectin 1% + Azelaic Acid 15%    . Tocilizumab (ACTEMRA ACTPEN) 162 MG/0.9ML SOAJ As directed subcutaneous weekly. 3.6 mL 2  . traMADol (ULTRAM) 50 MG tablet Take 100 mg by mouth 2 (two) times daily.     . Butalbital-APAP-Caffeine 50-325-40 MG capsule Take 1 capsule by mouth daily as needed. (Patient taking differently: Take 1 capsule by mouth daily as needed (Migraines). ) 30 capsule 0  . ondansetron (ZOFRAN) 4 MG tablet Take 1 tablet (4 mg total) by mouth every 8 (eight) hours as needed for nausea or vomiting. 10 tablet 0  . rizatriptan (MAXALT-MLT) 10 MG disintegrating tablet Take 1 tablet (10 mg total) by mouth daily as needed for migraine. May repeat in 2 hours if needed 10 tablet 2  . triamcinolone cream (KENALOG) 0.1 % Apply 1 application topically 2 (two) times daily as needed.  (Patient taking differently: Apply 1 application topically 2 (two) times daily as needed (eczema). ) 15 g 1     Physical Exam: Inspection of the right shoulder demonstrates that the surgical incision overall is well approximated.  There is no induration of the skin edges.  There is an area of opening proximal 2.5 cm at the mid/proximal third junction of the incision from which significant serosanguineous drainage persists.  She remains neurovascular intact in extremity with minimal discomfort on gentle passive motion of the shoulder.  Her recent radiographs demonstrate her implant to be in good position and alignment.  Vitals  Temp:  [98.4 F (36.9 C)] 98.4 F (36.9 C) (11/09 0829) Pulse Rate:  [97-107] 97 (11/09 0849) Resp:  [20] 20 (11/09 0829) BP: (133)/(85) 133/85 (11/09 0829) SpO2:  [98 %-100 %] 98 % (11/09 0849) Weight:  [54.4 kg] 54.4 kg (11/09 0849)  Assessment/Plan  Impression: Right shoulder wound drainage  Plan of Action: Procedure(s): IRRIGATION AND DEBRIDEMENT  RIGHT SHOULDER WITH WOUND CLOSURE  Kendle Erker M Milledge Gerding 11/13/2019, 10:32 AM Contact # [phone]

## 2019-11-13 NOTE — Anesthesia Preprocedure Evaluation (Addendum)
Anesthesia Evaluation  Patient identified by MRN, date of birth, ID band Patient awake    Reviewed: Allergy & Precautions, H&P , NPO status , Patient's Chart, lab work & pertinent test results  Airway Mallampati: II  TM Distance: >3 FB Neck ROM: Full    Dental no notable dental hx. (+) Teeth Intact, Dental Advisory Given   Pulmonary former smoker,    Pulmonary exam normal breath sounds clear to auscultation       Cardiovascular Exercise Tolerance: Good negative cardio ROS Normal cardiovascular exam Rhythm:Regular Rate:Normal     Neuro/Psych  Headaches, negative psych ROS   GI/Hepatic Neg liver ROS,   Endo/Other  negative endocrine ROS  Renal/GU K+ 4.0 Cr 0.54     Musculoskeletal  (+) Arthritis , Rheumatoid disorders,    Abdominal   Peds  Hematology negative hematology ROS (+) hgb 12.0 Plt 322  T&S available   Anesthesia Other Findings   Reproductive/Obstetrics negative OB ROS                             Anesthesia Physical  Anesthesia Plan  ASA: II  Anesthesia Plan: General   Post-op Pain Management: GA combined w/ Regional for post-op pain   Induction: Intravenous  PONV Risk Score and Plan: 4 or greater and Treatment may vary due to age or medical condition, Midazolam and Ondansetron  Airway Management Planned: Oral ETT and LMA  Additional Equipment: None  Intra-op Plan:   Post-operative Plan: Extubation in OR  Informed Consent: I have reviewed the patients History and Physical, chart, labs and discussed the procedure including the risks, benefits and alternatives for the proposed anesthesia with the patient or authorized representative who has indicated his/her understanding and acceptance.     Dental advisory given  Plan Discussed with: CRNA and Anesthesiologist  Anesthesia Plan Comments: (  )       Anesthesia Quick Evaluation

## 2019-11-13 NOTE — Transfer of Care (Signed)
Immediate Anesthesia Transfer of Care Note  Patient: Anne Garrett  Procedure(s) Performed: IRRIGATION AND DEBRIDEMENT RIGHT SHOULDER WITH WOUND CLOSURE (Right Shoulder)  Patient Location: PACU  Anesthesia Type:GA combined with regional for post-op pain  Level of Consciousness: drowsy  Airway & Oxygen Therapy: Patient Spontanous Breathing and Patient connected to nasal cannula oxygen  Post-op Assessment: Report given to RN and Post -op Vital signs reviewed and stable  Post vital signs: Reviewed and stable  Last Vitals:  Vitals Value Taken Time  BP 120/79 11/13/19 1327  Temp    Pulse 92 11/13/19 1329  Resp 11 11/13/19 1329  SpO2 100 % 11/13/19 1329  Vitals shown include unvalidated device data.  Last Pain:  Vitals:   11/13/19 0829  TempSrc: Oral         Complications: No complications documented.

## 2019-11-13 NOTE — Op Note (Signed)
11/13/2019  1:05 PM  PATIENT:   Anne Garrett  44 y.o. female  PRE-OPERATIVE DIAGNOSIS:  Right shoulder wound drainage  POST-OPERATIVE DIAGNOSIS: Same with operative finding of multiple diffuse bleeding surfaces primarily under the subcutaneous skin flaps but also along the deep muscular planes  PROCEDURE:  1.  Exploration, irrigation, and debridement of right shoulder incision and deep tissue layers with subsequent primary closure after obtaining hemostasis  SURGEON:  Lynix Bonine, Metta Clines. M.D.  ASSISTANTS: Jenetta Loges, PA-C  ANESTHESIA:   LMA general anesthesia with interscalene block using Exparel  EBL: Less than 100 cc  SPECIMEN: None  Drains: Medium Hemovac x1   PATIENT DISPOSITION:  PACU - hemodynamically stable.    PLAN OF CARE: Admit for overnight observation  Brief history:  Anne Garrett is a 44 year old female who has recently undergone a right shoulder revision reverse arthroplasty back on 11/01/2019.  She initially did well postop but on postop day 5 developed some persistent serous drainage from a small defect at the junction between the proximal and mid thirds of her incision.  This drainage has persisted and due to the ongoing drainage and lack of improvement she is brought to the operating this time for planned wound exploration irrigation debridement and primary closure.  Preoperatively I counseled the patient regarding treatment options as well as potential risks versus benefits thereof.  Possible surgical complications were reviewed including bleeding, infection, neurovascular injury, persistence of pain, loss of motion, anesthetic complication, and possibly for additional surgery.  She understands, accepts, and agrees to plan procedure.  Procedure in detail:  After undergoing routine preop evaluation the patient received prophylactic antibiotics and interscalene block was established in the holding area by the anesthesia department.  Patient was  subsequently placed supine on the operating table underwent the smooth induction of a LMA general anesthesia.  Placed in the beachchair position and appropriate padding protected.  The right shoulder girdle was sterilely prepped and draped in standard fashion.  Timeout was called.  During the prep we noted there was ongoing significant serosanguineous drainage from the incision.  Incision was reopened in its entirety.  The previously placed subcuticular and subcutaneous sutures were all removed.  No dominant bleeding site was found within the subcutaneous tissues but all cut and mechanically disturbed surfaces did bleed consistently.  We then reopened the deltopectoral interval and remove the sutures and again noted broad areas of bleeding throughout all of the tissue surfaces.  We did a careful exploration of the entirety of the wound.  The joint itself was stable.  Pulsatile lavage irrigation was then utilized.  We did use electrocautery on few localized punctate bleeding areas.  In general however we did not find a single dominant bleeding source but rather all of the surfaces tended to bleed continuously.  We applied topical TXA and allowed for pressurization and there was still some continued bleeding.  With additional time ultimately good hemostasis of the deep tissue planes was achieved in the deltopectoral interval was then reapproximated after we placed a deep drain size medium Hemovac.  The deltopectoral interval was then reapproximated with a series of figure-of-eight #1 Vicryl sutures.  The subcutaneous layer continue to bleed moderately and additional TXA was applied.  We attempted a closure but there is so much continued bleeding that the sutures were removed we applied a topical hemostatic agent and ultimately were able to achieve good hemostasis.  We then closed the skin layer with interrupted 2-0 nylon vertical mattress sutures.  Dry dressing  was then applied.  Right and was placed in a sling.   Patient was awakened, extubated, and taken to the recovery room in stable addition.  Jenetta Loges, PA-C was utilized as an Environmental consultant throughout this case, essential for help with positioning the patient, positioning extremity, tissue manipulation, implantation of the prosthesis, suture management, wound closure, and intraoperative decision-making.  Postop plan is for overnight observation.  We will evaluate wound and Hemovac drainage in the a.m. and if there has been minimal drainage consider pulling the Hemovac and allow discharge home.  We will monitor her progress carefully from a clinical perspective.  Marin Shutter MD   Contact # [phone]

## 2019-11-13 NOTE — Anesthesia Postprocedure Evaluation (Signed)
Anesthesia Post Note  Patient: Marenda Accardi  Procedure(s) Performed: IRRIGATION AND DEBRIDEMENT RIGHT SHOULDER WITH WOUND CLOSURE (Right Shoulder)     Patient location during evaluation: PACU Anesthesia Type: General Level of consciousness: awake and alert Pain management: pain level controlled Vital Signs Assessment: post-procedure vital signs reviewed and stable Respiratory status: spontaneous breathing, nonlabored ventilation, respiratory function stable and patient connected to nasal cannula oxygen Cardiovascular status: blood pressure returned to baseline and stable Postop Assessment: no apparent nausea or vomiting Anesthetic complications: no   No complications documented.  Last Vitals:  Vitals:   11/13/19 1327 11/13/19 1342  BP: 120/79 129/87  Pulse: 95 92  Resp: 12 15  Temp: (!) 36.2 C   SpO2: 100% 98%    Last Pain:  Vitals:   11/13/19 1327  TempSrc:   PainSc: 10-Worst pain ever                 Gregoire Bennis

## 2019-11-13 NOTE — Anesthesia Procedure Notes (Signed)
Anesthesia Regional Block: Interscalene brachial plexus block   Pre-Anesthetic Checklist: ,, timeout performed, Correct Patient, Correct Site, Correct Laterality, Correct Procedure, Correct Position, site marked, Risks and benefits discussed,  Surgical consent,  Pre-op evaluation,  At surgeon's request and post-op pain management  Laterality: Right  Prep: chloraprep       Needles:  Injection technique: Single-shot  Needle Type: Echogenic Stimulator Needle     Needle Length: 5cm  Needle Gauge: 22     Additional Needles:   Procedures:, nerve stimulator,,, ultrasound used (permanent image in chart),,,,   Nerve Stimulator or Paresthesia:  Response: quadraceps contraction, 0.45 mA,   Additional Responses:   Narrative:  Start time: 11/13/2019 10:27 AM End time: 11/13/2019 10:32 AM Injection made incrementally with aspirations every 5 mL.  Performed by: Personally  Anesthesiologist: Janeece Riggers, MD  Additional Notes: Functioning IV was confirmed and monitors were applied.  A 34mm 22ga Arrow echogenic stimulator needle was used. Sterile prep and drape,hand hygiene and sterile gloves were used. Ultrasound guidance: relevant anatomy identified, needle position confirmed, local anesthetic spread visualized around nerve(s)., vascular puncture avoided.  Image printed for medical record. Negative aspiration and negative test dose prior to incremental administration of local anesthetic. The patient tolerated the procedure well.

## 2019-11-13 NOTE — Anesthesia Procedure Notes (Signed)
Procedure Name: LMA Insertion Date/Time: 11/13/2019 11:07 AM Performed by: Imagene Riches, CRNA Pre-anesthesia Checklist: Patient identified, Emergency Drugs available, Suction available and Patient being monitored Patient Re-evaluated:Patient Re-evaluated prior to induction Oxygen Delivery Method: Circle System Utilized Preoxygenation: Pre-oxygenation with 100% oxygen Induction Type: IV induction Ventilation: Mask ventilation without difficulty LMA: LMA inserted LMA Size: 4.0 Number of attempts: 1 Airway Equipment and Method: Bite block Placement Confirmation: positive ETCO2 Tube secured with: Tape Dental Injury: Teeth and Oropharynx as per pre-operative assessment

## 2019-11-14 ENCOUNTER — Other Ambulatory Visit (HOSPITAL_COMMUNITY): Payer: Self-pay | Admitting: Orthopedic Surgery

## 2019-11-14 ENCOUNTER — Encounter (HOSPITAL_COMMUNITY): Payer: Self-pay | Admitting: Orthopedic Surgery

## 2019-11-14 DIAGNOSIS — S41001D Unspecified open wound of right shoulder, subsequent encounter: Secondary | ICD-10-CM | POA: Diagnosis not present

## 2019-11-14 DIAGNOSIS — Z79899 Other long term (current) drug therapy: Secondary | ICD-10-CM | POA: Diagnosis not present

## 2019-11-14 DIAGNOSIS — Z87891 Personal history of nicotine dependence: Secondary | ICD-10-CM | POA: Diagnosis not present

## 2019-11-14 MED ORDER — CEPHALEXIN 500 MG PO CAPS: 500.0000 mg | ORAL_CAPSULE | Freq: Four times a day (QID) | ORAL | 0 refills | Status: DC

## 2019-11-14 MED FILL — CEPHALEXIN 500 MG CAPSULE: 500 | 5 days supply | Qty: 20 | Fill #0

## 2019-11-14 NOTE — Plan of Care (Signed)

## 2019-11-14 NOTE — Progress Notes (Signed)
Discharge instruction given to patient and education done and patient verbalizes understanding

## 2019-11-14 NOTE — Discharge Summary (Signed)
PATIENT ID:      Anne Garrett  MRN:     [id number] DOB/AGE:    09/30/1975 / 44 y.o.     DISCHARGE SUMMARY  ADMISSION DATE:    11/13/2019 DISCHARGE DATE:    ADMISSION DIAGNOSIS: Right shoulder wound drainage Past Medical History:  Diagnosis Date  . Adrenal insufficiency ([place])    secondary to Prednisone  . Eczema    coner oif eye lid  . Headache    Migraine  . Intervertebral disk disease   . Juvenile idiopathic arthritis ([place])   . Osteoporosis   . Rheumatoid arthritis ([place])    Juvenile onset    DISCHARGE DIAGNOSIS:   Active Problems:   Status post evacuation of hematoma   PROCEDURE: Procedure(s): IRRIGATION AND DEBRIDEMENT RIGHT SHOULDER WITH WOUND CLOSURE on 11/13/2019  CONSULTS:    HISTORY:  See H&P in chart.  HOSPITAL COURSE:  Anne Garrett is a 45 y.o. admitted on 11/13/2019 with a diagnosis of Right shoulder wound drainage.  They were brought to the operating room on 11/13/2019 and underwent Procedure(s): IRRIGATION AND DEBRIDEMENT RIGHT SHOULDER WITH WOUND CLOSURE.    They were given perioperative antibiotics:  Anti-infectives (From admission, onward)   Start     Dose/Rate Route Frequency Ordered Stop   11/14/19 0000  cephALEXin (KEFLEX) 500 MG capsule        500 mg Oral 4 times daily 11/14/19 0856 11/24/19 2359   11/13/19 1715  ceFAZolin (ANCEF) IVPB 1 g/50 mL premix        1 g 100 mL/hr over 30 Minutes Intravenous Every 6 hours 11/13/19 1418 11/14/19 0606   11/13/19 0830  ceFAZolin (ANCEF) IVPB 2g/100 mL premix        2 g 200 mL/hr over 30 Minutes Intravenous On call to O.R. 11/13/19 3716 11/13/19 1148    .  Patient underwent the above named procedure and tolerated it well. The following day they were hemodynamically stable and pain was controlled on oral analgesics. They were neurovascularly intact to the operative extremity, her drain was removed and she had minimal output overnight.     DIAGNOSTIC STUDIES:  RECENT RADIOGRAPHIC STUDIES :  No  results found.  RECENT VITAL SIGNS:   Patient Vitals for the past 24 hrs:  BP Temp Temp src Pulse Resp SpO2  11/14/19 0740 113/70 98.4 F (36.9 C) Oral 90 17 100 %  11/14/19 0500 112/73 99.3 F (37.4 C) Oral 92 18 98 %  11/14/19 0100 134/78 98.6 F (37 C) Oral 95 15 98 %  11/13/19 2100 102/76 98.8 F (37.1 C) Oral 95 16 98 %  11/13/19 1658 121/81 99.4 F (37.4 C) Oral 99 18 97 %  11/13/19 1532 112/66 98.6 F (37 C) Oral 93 17 95 %  11/13/19 1423 108/65 98.8 F (37.1 C) Oral 90 17 96 %  11/13/19 1357 122/75 98.2 F (36.8 C) -- 99 16 98 %  11/13/19 1342 129/87 -- -- 92 15 98 %  11/13/19 1327 120/79 (!) 97.2 F (36.2 C) -- 95 12 100 %  .  RECENT EKG RESULTS:    Orders placed or performed during the hospital encounter of 03/05/19  . EKG 12-Lead  . EKG 12-Lead  . ED EKG  . ED EKG    DISCHARGE INSTRUCTIONS:    DISCHARGE MEDICATIONS:   Allergies as of 11/14/2019      Reactions   Sulfa Antibiotics Itching, Rash   Head to toe   Teriparatide Other (See Comments)  Severe Muscle Pain, [place]   Valproic Acid And Related Rash   Head to toe   Celexa [citalopram] Diarrhea      Diflucan [fluconazole] Rash   Fixed drug eruption   Leflunomide Diarrhea   Paroxetine Hcl Diarrhea   Zoloft [sertraline] Diarrhea      Medication List    TAKE these medications   Actemra ACTPen 162 MG/0.9ML Soaj Generic drug: Tocilizumab As directed subcutaneous weekly.   Butalbital-APAP-Caffeine 50-325-40 MG capsule Take 1 capsule by mouth daily as needed. What changed: reasons to take this   CALCIUM+D3 PO Take 1 tablet by mouth daily.   cephALEXin 500 MG capsule Commonly known as: KEFLEX Take 1 capsule (500 mg total) by mouth 4 (four) times daily for 10 days.   cyclobenzaprine 10 MG tablet Commonly known as: FLEXERIL Take 1 tablet (10 mg total) by mouth 3 (three) times daily as needed.   HAIR/SKIN/NAILS PO Take 1 tablet by mouth daily.   multivitamin with minerals Tabs  tablet Take 1 tablet by mouth daily.   ondansetron 4 MG tablet Commonly known as: Zofran Take 1 tablet (4 mg total) by mouth every 8 (eight) hours as needed for nausea or vomiting.   OVER THE COUNTER MEDICATION Take 1 capsule by mouth daily. Juice plus Omega/Vegetable   oxyCODONE-acetaminophen 5-325 MG tablet Commonly known as: Percocet Take 1 tablet by mouth every 4 (four) hours as needed (max 6 q).   predniSONE 5 MG tablet Commonly known as: DELTASONE Take 5 mg by mouth every evening.   PRESCRIPTION MEDICATION Apply 1 application topically See admin instructions. Apply dime-size amount to face 1-2 TIMES per day  Metronidazole 1% + Ivermectin 1% + Azelaic Acid 15%   rizatriptan 10 MG disintegrating tablet Commonly known as: Maxalt-MLT Take 1 tablet (10 mg total) by mouth daily as needed for migraine. May repeat in 2 hours if needed   traMADol 50 MG tablet Commonly known as: ULTRAM Take 100 mg by mouth 2 (two) times daily.   triamcinolone cream 0.1 % Commonly known as: KENALOG Apply 1 application topically 2 (two) times daily as needed. What changed: reasons to take this   Vitamin D3 50 MCG (2000 UT) Tabs Take 2,000 Units by mouth daily.       FOLLOW UP VISIT:    Follow-up Information    [name], MD Follow up.   Specialty: Orthopedic Surgery Why: Monday 11/15 at 8:00 for pineapples:) Contact information: [address] [place] [address] [phone]               DISCHARGE TO: Home   DISCHARGE CONDITION:  Thereasa Parkin Anne Garrett for Dr. [name] 11/14/2019, 8:59 AM

## 2019-11-14 NOTE — Discharge Instructions (Signed)
Anne Garrett. Supple, M.D., F.A.A.O.S. Orthopaedic Surgery Specializing in Arthroscopic and Reconstructive Surgery of the Shoulder [phone] [address] [place], [place] [address] - Fax [phone]   POST-OP TOTAL SHOULDER REPLACEMENT INSTRUCTIONS  1. Follow up in the office for your first post-op appointment 10-14 days from the date of your surgery. If you do not already have a scheduled appointment, our office will contact you to schedule.  2. The bandage over your incision is waterproof. You may begin showering with this dressing on. You may leave this dressing on until first follow up appointment within 2 weeks. We prefer you leave this dressing in place until follow up however after 5-7 days if you are having itching or skin irritation and would like to remove it you may do so. Go slow and tug at the borders gently to break the bond the dressing has with the skin. At this point if there is no drainage it is okay to go without a bandage or you may cover it with a light guaze and tape. You can also expect significant bruising around your shoulder that will drift down your arm and into your chest wall. This is very normal and should resolve over several days.   3. Wear your sling/immobilizer at all times except to perform the exercises below or to occasionally let your arm dangle by your side to stretch your elbow. You also need to sleep in your sling immobilizer until instructed otherwise. It is ok to remove your sling if you are sitting in a controlled environment and allow your arm to rest in a position of comfort by your side or on your lap with pillows to give your neck and skin a break from the sling. You may remove it to allow arm to dangle by side to shower. If you are up walking around and when you go to sleep at night you need to wear it.  4. Range of motion to your elbow, wrist, and hand are encouraged 3-5 times daily. Exercise to your hand and fingers helps to reduce  swelling you may experience.   5. Prescriptions for a pain medication and a muscle relaxant are provided for you. It is recommended that if you are experiencing pain that you pain medication alone is not controlling, add the muscle relaxant along with the pain medication which can give additional pain relief. The first 1-2 days is generally the most severe of your pain and then should gradually decrease. As your pain lessens it is recommended that you decrease your use of the pain medications to an "as needed basis'" only and to always comply with the recommended dosages of the pain medications.  6. Pain medications can produce constipation along with their use. If you experience this, the use of an over the counter stool softener or laxative daily is recommended.   7. For additional questions or concerns, please do not hesitate to call the office. If after hours there is an answering service to forward your concerns to the physician on call.  8.Pain control following an exparel block  To help control your post-operative pain you received a nerve block  performed with Exparel which is a long acting anesthetic (numbing agent) which can provide pain relief and sensations of numbness (and relief of pain) in the operative shoulder and arm for up to 3 days. Sometimes it provides mixed relief, meaning you may still have numbness in certain areas of the arm but can still be able to  move  parts of that arm, hand, and fingers. We recommend that your prescribed pain medications  be used as needed. We do not feel it is necessary to "pre medicate" and "stay ahead" of pain.  Taking narcotic pain medications when you are not having any pain can lead to unnecessary and potentially dangerous side effects.    9. Use the ice machine as much as possible in the first 5-7 days from surgery, then you can wean its use to as needed. The ice typically needs to be replaced every 6 hours, instead of ice you can actually freeze  water bottles to put in the cooler and then fill water around them to avoid having to purchase ice. You can have spare water bottles freezing to allow you to rotate them once they have melted. Try to have a thin shirt or light cloth or towel under the ice wrap to protect your skin.   FOR ADDITIONAL INFO ON ICE MACHINE AND INSTRUCTIONS GO TO THE WEBSITE AT  [email or web address]  10.  We recommend that you avoid any dental work or cleaning in the first 3 months following your joint replacement. This is to help minimize the possibility of infection from the bacteria in your mouth that enters your bloodstream during dental work. We also recommend that you take an antibiotic prior to your dental work for the first year after your shoulder replacement to further help reduce that risk. Please simply contact our office for antibiotics to be sent to your pharmacy prior to dental work.  11. Dental Antibiotics:  In most cases prophylactic antibiotics for Dental procdeures after total joint surgery are not necessary.  Exceptions are as follows:  1. History of prior total joint infection  2. Severely immunocompromised (Organ Transplant, cancer chemotherapy, Rheumatoid biologic meds such as Bison)  3. Poorly controlled diabetes (A1C &gt; 8.0, blood glucose over 200)  If you have one of these conditions, contact your surgeon for an antibiotic prescription, prior to your dental procedure.   POST-OP EXERCISES  Keep shoulder quiet except for necessary movement for dressing and hygiene

## 2019-11-14 NOTE — Plan of Care (Signed)

## 2019-11-15 ENCOUNTER — Emergency Department (HOSPITAL_COMMUNITY): Admission: EM | Admit: 2019-11-15 | Discharge: 2019-11-15 | Discharge status: Absent without leave | Payer: 59

## 2019-11-15 ENCOUNTER — Other Ambulatory Visit: Payer: Self-pay

## 2019-11-20 ENCOUNTER — Other Ambulatory Visit (HOSPITAL_COMMUNITY): Payer: Self-pay | Admitting: Physician Assistant

## 2019-11-20 MED FILL — predniSONE 5 MG TABS: 5 | 90 days supply | Qty: 180 | Fill #0

## 2019-11-21 ENCOUNTER — Telehealth: Payer: Self-pay | Admitting: Hematology and Oncology

## 2019-11-21 NOTE — Telephone Encounter (Signed)
Received a new hem referral from Dr. Onnie Graham at [facility] for easy bruising. [name] has been cld and scheduled to see Dr. Chryl Heck on 11/19 at [place]. Pt aware to arrive 15 minutes early.

## 2019-11-23 ENCOUNTER — Inpatient Hospital Stay: Payer: 59

## 2019-11-23 ENCOUNTER — Telehealth: Payer: Self-pay | Admitting: Hematology and Oncology

## 2019-11-23 ENCOUNTER — Encounter: Payer: Self-pay | Admitting: Hematology and Oncology

## 2019-11-23 ENCOUNTER — Inpatient Hospital Stay: Payer: 59 | Attending: Hematology and Oncology | Admitting: Hematology and Oncology

## 2019-11-23 ENCOUNTER — Other Ambulatory Visit: Payer: Self-pay

## 2019-11-23 VITALS — BP 133/88 | HR 109 | Temp 98.4°F | Resp 18 | Ht 59.0 in | Wt 122.0 lb

## 2019-11-23 DIAGNOSIS — Z87891 Personal history of nicotine dependence: Secondary | ICD-10-CM | POA: Insufficient documentation

## 2019-11-23 DIAGNOSIS — R238 Other skin changes: Secondary | ICD-10-CM

## 2019-11-23 DIAGNOSIS — Z7952 Long term (current) use of systemic steroids: Secondary | ICD-10-CM | POA: Insufficient documentation

## 2019-11-23 DIAGNOSIS — R233 Spontaneous ecchymoses: Secondary | ICD-10-CM | POA: Insufficient documentation

## 2019-11-23 DIAGNOSIS — M069 Rheumatoid arthritis, unspecified: Secondary | ICD-10-CM | POA: Insufficient documentation

## 2019-11-23 DIAGNOSIS — E2740 Unspecified adrenocortical insufficiency: Secondary | ICD-10-CM | POA: Diagnosis not present

## 2019-11-23 DIAGNOSIS — Z79899 Other long term (current) drug therapy: Secondary | ICD-10-CM | POA: Diagnosis not present

## 2019-11-23 LAB — CBC WITH DIFFERENTIAL/PLATELET
Abs Immature Granulocytes: 0.03 10*3/uL (ref 0.00–0.07)
Basophils Absolute: 0 10*3/uL (ref 0.0–0.1)
Basophils Relative: 0 %
Eosinophils Absolute: 0.1 10*3/uL (ref 0.0–0.5)
Eosinophils Relative: 1 %
HCT: 29.6 % — ABNORMAL LOW (ref 36.0–46.0)
Hemoglobin: 9.3 g/dL — ABNORMAL LOW (ref 12.0–15.0)
Immature Granulocytes: 0 %
Lymphocytes Relative: 44 %
Lymphs Abs: 3.1 10*3/uL (ref 0.7–4.0)
MCH: 30.8 pg (ref 26.0–34.0)
MCHC: 31.4 g/dL (ref 30.0–36.0)
MCV: 98 fL (ref 80.0–100.0)
Monocytes Absolute: 1 10*3/uL (ref 0.1–1.0)
Monocytes Relative: 14 %
Neutro Abs: 3 10*3/uL (ref 1.7–7.7)
Neutrophils Relative %: 41 %
Platelets: 381 10*3/uL (ref 150–400)
RBC: 3.02 MIL/uL — ABNORMAL LOW (ref 3.87–5.11)
RDW: 12.2 % (ref 11.5–15.5)
WBC: 7.3 10*3/uL (ref 4.0–10.5)
nRBC: 0 % (ref 0.0–0.2)

## 2019-11-23 LAB — CMP (CANCER CENTER ONLY)
ALT: 11 U/L (ref 0–44)
AST: 14 U/L — ABNORMAL LOW (ref 15–41)
Albumin: 3.6 g/dL (ref 3.5–5.0)
Alkaline Phosphatase: 82 U/L (ref 38–126)
Anion gap: 8 (ref 5–15)
BUN: 5 mg/dL — ABNORMAL LOW (ref 6–20)
CO2: 25 mmol/L (ref 22–32)
Calcium: 8.6 mg/dL — ABNORMAL LOW (ref 8.9–10.3)
Chloride: 106 mmol/L (ref 98–111)
Creatinine: 0.63 mg/dL (ref 0.44–1.00)
GFR, Estimated: >60 mL/min (ref >60)
Glucose, Bld: 66 mg/dL — ABNORMAL LOW (ref 70–99)
Potassium: 3.6 mmol/L (ref 3.5–5.1)
Sodium: 139 mmol/L (ref 135–145)
Total Bilirubin: 0.2 mg/dL — ABNORMAL LOW (ref 0.3–1.2)
Total Protein: 6.4 g/dL — ABNORMAL LOW (ref 6.5–8.1)

## 2019-11-23 LAB — APTT: aPTT: 28 seconds (ref 24–36)

## 2019-11-23 LAB — PLATELET FUNCTION ASSAY: Collagen / Epinephrine: 87 seconds (ref 0–193)

## 2019-11-23 LAB — PROTIME-INR
INR: 1 (ref 0.8–1.2)
Prothrombin Time: 12.6 seconds (ref 11.4–15.2)

## 2019-11-23 NOTE — Telephone Encounter (Signed)
Scheduled per 11/19 los. No avs or calendar needed to be printed. Pt is aware of appt time and date.

## 2019-11-23 NOTE — Progress Notes (Signed)
[facility] NOTE  Patient Care Team: [name], MD as PCP - General (Family Medicine) [facility], [facility]) [name], MD as Consulting Physician (Rheumatology) [name], MD as Consulting Physician (Orthopedic Surgery) [name], MD as Consulting Physician (Orthopedic Surgery) [name], MD as Consulting Physician (Orthopedic Surgery) [name], DO as Consulting Physician (Obstetrics and Gynecology)  CHIEF COMPLAINTS/PURPOSE OF CONSULTATION:  Easy bruising  ASSESSMENT & PLAN:  No problem-specific Assessment & Plan notes found for this encounter.  Orders Placed This Encounter  Procedures  . CBC with Differential/Platelet    Standing Status:   Standing    Number of Occurrences:   22    Standing Expiration Date:   11/22/2020  . Protime-INR    Standing Status:   Standing    Number of Occurrences:   1  . APTT    Standing Status:   Future    Number of Occurrences:   1    Standing Expiration Date:   11/22/2020  . Von Willebrand panel    Standing Status:   Future    Number of Occurrences:   1    Standing Expiration Date:   11/22/2020  . Platelet function assay    Standing Status:   Future    Number of Occurrences:   1    Standing Expiration Date:   11/22/2020  . Vitamin K1, Serum    Standing Status:   Future    Number of Occurrences:   1    Standing Expiration Date:   11/22/2020  . CMP (Cancer Center only)    Standing Status:   Future    Number of Occurrences:   1    Standing Expiration Date:   11/22/2020   This is a very pleasant 44 year old female patient with past medical history significant for rheumatoid arthritis Tocilizumab, prednisone 5 mg daily for several years, migraines referred to hematology for evaluation and recommendations regarding the bruising. Patient is a good historian and arrived to the appointment today by herself.  She has had multiple surgeries in the past and never had any bruising or  bleeding complication.  She had left foot surgery in March 2021 which went well.  However with this right shoulder surgery, she has noticed significant bruising and there was operative notes commenting on how the mucosal surfaces were constantly bleeding during the surgery. She has also noticed more bruising in the past few months.  The only new medications that she has started in June 2021 are biotin and some juice capsules which are supplements. No peripartum or postpartum bleeding complications.  No family history of hematological disorders.  Menstrual cycles were regular and normal according to patient. No dental complications with bleeding. Physical examination no major bruises today, no palpable lymphadenopathy or hepatosplenomegaly, deformities noted from rheumatoid arthritis. Given History, I do not believe this is a congenital disorder, most likely acquired secondary to chronic prednisone use, supplements and lack of connective tissue support given her autoimmune disease I recommended that we do some basic work-up for bleeding diathesis including CBC, CMP, INR, PTT, von Willebrand factor and vitamin K levels. If the above-mentioned work-up is normal, then we may elect monitoring.  Based on the PT/PTT values, we may elect to proceed with mixing study.  Thank you for consulting on the care of this patient.  Please not hesitate to contact us with any additional questions or concerns.  HISTORY OF PRESENTING ILLNESS:  Yesica Kemler 44 y.o. female is  here because of easy bruising and recent surgery for right shoulder when it was noted that she has excessive bleeding from all subcutaneous surfaces.  Patient arrived to the appointment today by herself.  She mentions that in the past she definitely noticed more bruising.  Even with minor trauma she has noticed that she tends to have bruises such as barely touching a surface.  She has been on prednisone pretty much all her life, was diagnosed with  rheumatoid arthritis at the age of 7 and hence this is not a change.  She has started taking some biotin supplementation and some nodularity 2021 and this medication.  She does not take any other blood thinners.  She has had multitude of surgeries in the past and never had any complication.  In March 2021, she did have foot surgery did not result in any excessive bleeding complications.  She never required any blood transfusion from this bleeding issues prior.  She has 2 kids, no postpartum bleeding complications.  No family history of bleeding disorders.  She denies taking any NSAIDs she has garlic, gingko.  Bruises sometimes lasts a long time.  She does not have any unusual bruising today except for single small bruise on her left upper arm. She is having some difficulty obtaining tocilizumab for her rheumatoid arthritis.  She otherwise has tried methotrexate in the past.  Besides rheumatoid arthritis and its complications, she has felt well overall. Hair loss and brittle nails have improved since cessation of methotrexate. Eats a balanced diet hence less concern for nutritional deficiencies.  REVIEW OF SYSTEMS:    Constitutional: Denies fevers, chills or abnormal night sweats.  She feels she is perimenopausal, she feels warm all the time Eyes: Denies blurriness of vision, double vision or watery eyes, she wears contacts. Ears, nose, mouth, throat, and face: Denies mucositis or sore throat Respiratory: Denies cough, dyspnea or wheezes Cardiovascular: Denies palpitation, chest discomfort or lower extremity swelling Gastrointestinal:  Denies nausea, heartburn or change in overall.  Rest of the review of systems as mentioned below habits Skin: Denies abnormal skin rashes Lymphatics: Denies new lymphadenopathy or easy bruising Neurological:Denies numbness, tingling or new weaknesses Behavioral/Psych: Mood is stable, no new changes  All other systems were reviewed with the patient and are  negative.  MEDICAL HISTORY:  Past Medical History:  Diagnosis Date  . Adrenal insufficiency ([place])    secondary to Prednisone  . Eczema    coner oif eye lid  . Headache    Migraine  . Intervertebral disk disease   . Juvenile idiopathic arthritis ([place])   . Osteoporosis   . Rheumatoid arthritis ([place])    Juvenile onset    SURGICAL HISTORY: Past Surgical History:  Procedure Laterality Date  . ANKLE ARTHROPLASTY Right   . ANTERIOR CERVICAL DECOMP/DISCECTOMY FUSION N/A 06/19/2015   Procedure: C3-4 C4-5 Anterior cervical decompression/diskectomy/fusion;  Surgeon: Jovita Gamma, MD;  Location: [facility];  Service: Neurosurgery;  Laterality: N/A;  C3-4 C4-5 Anterior cervical decompression/diskectomy/fusion  . APPENDECTOMY    . CARPAL TUNNEL RELEASE Right   . CESAREAN SECTION    . CHOLECYSTECTOMY    . CHOLECYSTECTOMY  2006  . COLONOSCOPY    . ESSURE TUBAL LIGATION     Essure Implant-Permanent birth control  . excision to palm Left    excision of mass to right plam  . FOOT ARTHRODESIS Left 03/01/2019   Procedure: Left talonavicular and naviculocuneiform arthrodesis;  Surgeon: [name], MD;  Location: [facility];  Service:  Orthopedics;  Laterality: Left;  . HARDWARE REMOVAL Right 02/02/2017   Procedure: RIGHT WRIST DEEP IMPLANT REMOVAL, RIGHT WRIST AND HAND EXTENSOR TENDON RECONSTRUCTION, TENDON TRANSFER AND RECONSTRUCTION AS INDICATED;  Surgeon: Iran Planas, MD;  Location: [facility];  Service: Orthopedics;  Laterality: Right;  . IRRIGATION AND DEBRIDEMENT SHOULDER Right 11/13/2019   Procedure: IRRIGATION AND DEBRIDEMENT RIGHT SHOULDER WITH WOUND CLOSURE;  Surgeon: [name], MD;  Location: [facility];  Service: Orthopedics;  Laterality: Right;  55min  . JOINT REPLACEMENT Bilateral    bilat hip replacements  . ORIF FEMUR FRACTURE Right   . OTHER SURGICAL HISTORY  06/04/2016   Revision of PLIF  . plif l5-s1 arthrodeisis    2018  . POSTERIOR CERVICAL  FUSION/FORAMINOTOMY N/A 07/12/2015   Procedure: C2 to C5 Cervical laminectomy, C2 to C5 posterior cervical arthrodesis with instrumentation and bone graft;  Surgeon: Jovita Gamma, MD;  Location: [facility];  Service: Neurosurgery;  Laterality: N/A;  C2 to C5 Cervical laminectomy, C2  to C5 posterior cervical arthrodesis with instrumentation and bone graft  . REPAIR EXTENSOR TENDON Right 02/02/2017   Procedure: REPAIR EXTENSOR TENDON;  Surgeon: Iran Planas, MD;  Location: [facility];  Service: Orthopedics;  Laterality: Right;  . REVERSE SHOULDER ARTHROPLASTY Left 09/07/2018   Procedure: REVERSE SHOULDER ARTHROPLASTY;  Surgeon: [name], MD;  Location: [facility];  Service: Orthopedics;  Laterality: Left;  . REVERSE SHOULDER ARTHROPLASTY Right 09/20/2019   Procedure: REVERSE SHOULDER ARTHROPLASTY;  Surgeon: [name], MD;  Location: [facility];  Service: Orthopedics;  Laterality: Right;  167min  . REVISION TOTAL SHOULDER TO REVERSE TOTAL SHOULDER Right 11/01/2019   Procedure: revision right total shoulder to reverse total shoulder;  Surgeon: [name], MD;  Location: [facility];  Service: Orthopedics;  Laterality: Right;  exparel block  . right foot surgery Right    right jfoot arthrodesis and right ankle arthroplasty  . WRIST SURGERY Bilateral    arthrodesis    SOCIAL HISTORY: Social History   Socioeconomic History  . Marital status: Single    Spouse name: Not on file  . Number of children: 2  . Years of education: 74  . Highest education level: Not on file  Occupational History  . Occupation: Compliance Furniture conservator/restorer: [employer]  Tobacco Use  . Smoking status: Former Smoker    Packs/day: 0.25    Years: 20.00    Pack years: 5.00    Types: Cigarettes    Quit date: 07/02/2015    Years since quitting: 4.3  . Smokeless tobacco: Never Used  Vaping Use  . Vaping Use: Never used  Substance and Sexual Activity  . Alcohol use: Yes    Alcohol/week: 2.0 standard drinks     Types: 2 Glasses of wine per week    Comment: social  . Drug use: No  . Sexual activity: Yes    Birth control/protection: Other-see comments    Comment: essure coils  Other Topics Concern  . Not on file  Social History Narrative   Lives at home w/ 2 cats and child every other weekend   Significant other - Gerald Stabs   Right-hand   Drinks about 2 cups of coffee or soda per day   Social Determinants of Radio broadcast assistant Strain:   . Difficulty of Paying Living Expenses: Not on file  Food Insecurity:   . Worried About Charity fundraiser in the Last Year: Not on file  . Ran Out of Food  in the Last Year: Not on file  Transportation Needs:   . Lack of Transportation (Medical): Not on file  . Lack of Transportation (Non-Medical): Not on file  Physical Activity:   . Days of Exercise per Week: Not on file  . Minutes of Exercise per Session: Not on file  Stress:   . Feeling of Stress : Not on file  Social Connections:   . Frequency of Communication with Friends and Family: Not on file  . Frequency of Social Gatherings with Friends and Family: Not on file  . Attends Religious Services: Not on file  . Active Member of Clubs or Organizations: Not on file  . Attends Archivist Meetings: Not on file  . Marital Status: Not on file  Intimate Partner Violence:   . Fear of Current or Ex-Partner: Not on file  . Emotionally Abused: Not on file  . Physically Abused: Not on file  . Sexually Abused: Not on file    FAMILY HISTORY: Family History  Problem Relation Age of Onset  . Breast cancer Mother 14  . Nephritis Father   . Epilepsy Father   . Breast cancer Maternal Aunt        2 mat aunts   . Multiple sclerosis Maternal Uncle   . Psoriasis Paternal Aunt   . Diabetes Daughter     ALLERGIES:  is allergic to sulfa antibiotics, teriparatide, valproic acid and related, celexa [citalopram], diflucan [fluconazole], leflunomide, paroxetine hcl, and zoloft  [sertraline].  MEDICATIONS:  Current Outpatient Medications  Medication Sig Dispense Refill  . Biotin w/ Vitamins C & E (HAIR/SKIN/NAILS PO) Take 1 tablet by mouth daily.    . Butalbital-APAP-Caffeine 50-325-40 MG capsule Take 1 capsule by mouth daily as needed. (Patient taking differently: Take 1 capsule by mouth daily as needed (Migraines). ) 30 capsule 0  . Calcium Carb-Cholecalciferol (CALCIUM+D3 PO) Take 1 tablet by mouth daily.    . Cholecalciferol (VITAMIN D3) 50 MCG (2000 UT) TABS Take 2,000 Units by mouth daily.    . cyclobenzaprine (FLEXERIL) 10 MG tablet Take 1 tablet (10 mg total) by mouth 3 (three) times daily as needed. 30 tablet 1  . OVER THE COUNTER MEDICATION Take 1 capsule by mouth daily. Juice plus Omega/Vegetable    . predniSONE (DELTASONE) 5 MG tablet Take 5 mg by mouth every evening.     Marland Kitchen PRESCRIPTION MEDICATION Apply 1 application topically See admin instructions. Apply dime-size amount to face 1-2 TIMES per day  Metronidazole 1% + Ivermectin 1% + Azelaic Acid 15%    . rizatriptan (MAXALT-MLT) 10 MG disintegrating tablet Take 1 tablet (10 mg total) by mouth daily as needed for migraine. May repeat in 2 hours if needed 10 tablet 2  . Tocilizumab (ACTEMRA ACTPEN) 162 MG/0.9ML SOAJ As directed subcutaneous weekly. 3.6 mL 2  . traMADol (ULTRAM) 50 MG tablet Take 100 mg by mouth 2 (two) times daily.     Marland Kitchen triamcinolone cream (KENALOG) 0.1 % Apply 1 application topically 2 (two) times daily as needed. (Patient taking differently: Apply 1 application topically 2 (two) times daily as needed (eczema). ) 15 g 1   No current facility-administered medications for this visit.     PHYSICAL EXAMINATION: ECOG PERFORMANCE STATUS: 0 - Asymptomatic  Vitals:   11/23/19 0757  BP: 133/88  Pulse: (!) 109  Resp: 18  Temp: 98.4 F (36.9 C)  SpO2: 100%   Filed Weights   11/23/19 0757  Weight: 122 lb (55.3 kg)  GENERAL:alert, no distress and comfortable SKIN: skin color,  texture, turgor are normal, no rashes or significant lesions.  Small bruise on otherwise no major bruises noted today. EYES: normal, conjunctiva are pink and non-injected, sclera clear OROPHARYNX:no exudate, no erythema and lips, buccal mucosa, and tongue normal.  Poor dentition. NECK: Supple, thyroid normal size, non-tender, without nodularity LYMPH:  no palpable lymphadenopathy in the cervical, axillary area. limited ROM in the right shoulder hence did not examine right axilla LUNGS: clear to auscultation and percussion with normal breathing effort HEART: regular rate & rhythm and no murmurs and no lower extremity edema ABDOMEN:abdomen soft, non-tender and normal bowel sounds.  No hepatosplenomegaly Musculoskeletal:no cyanosis of digits and no clubbing  PSYCH: alert & oriented x 3 with fluent speech NEURO: no focal motor/sensory deficits  LABORATORY DATA:  I have reviewed the data as listed Lab Results  Component Value Date   WBC 6.9 11/13/2019   HGB 9.5 (L) 11/13/2019   HCT 28.0 (L) 11/13/2019   MCV 103.1 (H) 11/13/2019   PLT 319 11/13/2019     Chemistry      Component Value Date/Time   NA 142 11/13/2019 1153   NA 139 04/30/2019 0000   K 3.3 (L) 11/13/2019 1153   CL 103 10/30/2019 1334   CO2 26 10/30/2019 1334   BUN 10 10/30/2019 1334   BUN 12 07/30/2019 0000   CREATININE 0.54 10/30/2019 1334   GLU 70 07/30/2019 0000      Component Value Date/Time   CALCIUM 8.5 (L) 10/30/2019 1334   ALKPHOS 63 04/30/2019 0000   AST 21 04/30/2019 0000   ALT 16 04/30/2019 0000   BILITOT 0.7 03/05/2019 1107     Reviewed pertinent labs.  Platelet count is normal.  There does not appear to be any work-up for coagulopathy  RADIOGRAPHIC STUDIES: I have personally reviewed the radiological images as listed and agreed with the findings in the report. No results found.  All questions were answered. The patient knows to call the clinic with any problems, questions or concerns. I have spent a  total of 45 minutes in the care of this patient including history and physical examination, review of medical records, counseling about causes of easy bruising, work-up and coordination of care.     Benay Pike, MD 11/23/2019 8:44 AM

## 2019-11-26 ENCOUNTER — Other Ambulatory Visit (HOSPITAL_COMMUNITY): Payer: Self-pay | Admitting: Physician Assistant

## 2019-11-26 LAB — VON WILLEBRAND PANEL
Coagulation Factor VIII: 177 % — ABNORMAL HIGH (ref 56–140)
Ristocetin Co-factor, Plasma: 174 % (ref 50–200)
Von Willebrand Antigen, Plasma: 155 % (ref 50–200)

## 2019-11-26 LAB — COAG STUDIES INTERP REPORT

## 2019-11-26 MED FILL — ACTEMRA ACTPEN 162 MG/0.9ML: 162 | 28 days supply | Qty: 4 | Fill #0

## 2019-11-26 MED FILL — traMADol HCL 50 MG TABS: 50 | 30 days supply | Qty: 120 | Fill #0

## 2019-11-28 LAB — VITAMIN K1, SERUM: VITAMIN K1: 0.22 ng/mL (ref 0.10–2.20)

## 2019-12-05 DIAGNOSIS — Z872 Personal history of diseases of the skin and subcutaneous tissue: Secondary | ICD-10-CM

## 2019-12-05 HISTORY — DX: Personal history of diseases of the skin and subcutaneous tissue: Z87.2

## 2019-12-06 ENCOUNTER — Other Ambulatory Visit (HOSPITAL_COMMUNITY): Payer: 59

## 2019-12-07 ENCOUNTER — Encounter: Payer: Self-pay | Admitting: Hematology and Oncology

## 2019-12-07 ENCOUNTER — Inpatient Hospital Stay: Payer: 59 | Attending: Hematology and Oncology | Admitting: Hematology and Oncology

## 2019-12-07 ENCOUNTER — Other Ambulatory Visit: Payer: Self-pay

## 2019-12-07 VITALS — BP 134/84 | HR 83 | Temp 97.9°F | Resp 18 | Ht 59.0 in | Wt 123.1 lb

## 2019-12-07 DIAGNOSIS — Z7952 Long term (current) use of systemic steroids: Secondary | ICD-10-CM | POA: Insufficient documentation

## 2019-12-07 DIAGNOSIS — L603 Nail dystrophy: Secondary | ICD-10-CM | POA: Insufficient documentation

## 2019-12-07 DIAGNOSIS — E2740 Unspecified adrenocortical insufficiency: Secondary | ICD-10-CM | POA: Insufficient documentation

## 2019-12-07 DIAGNOSIS — R238 Other skin changes: Secondary | ICD-10-CM

## 2019-12-07 DIAGNOSIS — R233 Spontaneous ecchymoses: Secondary | ICD-10-CM | POA: Insufficient documentation

## 2019-12-07 DIAGNOSIS — Z87891 Personal history of nicotine dependence: Secondary | ICD-10-CM | POA: Diagnosis not present

## 2019-12-07 DIAGNOSIS — Z79899 Other long term (current) drug therapy: Secondary | ICD-10-CM | POA: Diagnosis not present

## 2019-12-07 DIAGNOSIS — M069 Rheumatoid arthritis, unspecified: Secondary | ICD-10-CM | POA: Diagnosis not present

## 2019-12-07 NOTE — Progress Notes (Signed)
[place] NOTE  Patient Care Team: [name], MD as PCP - General (Family Medicine) Pa, [facility]) [name], MD as Consulting Physician (Rheumatology) [name], MD as Consulting Physician (Orthopedic Surgery) [name], MD as Consulting Physician (Orthopedic Surgery) [name], MD as Consulting Physician (Orthopedic Surgery) [name], DO as Consulting Physician (Obstetrics and Gynecology)  CHIEF COMPLAINTS/PURPOSE OF CONSULTATION:  Easy bruising  ASSESSMENT & PLAN:  No problem-specific Assessment & Plan notes found for this encounter.  No orders of the defined types were placed in this encounter.  This is a very pleasant 44 year old female patient with past medical history significant for rheumatoid arthritis Tocilizumab, prednisone 5 mg daily for several years, migraines referred to hematology for evaluation and recommendations regarding the bruising. Patient is a good historian and arrived to the appointment today by herself.  She has had multiple surgeries in the past and never had any bruising or bleeding complication.  She had left foot surgery in March 2021 which went well.  However with this right shoulder surgery, she has noticed significant bruising and there was operative notes commenting on how the mucosal surfaces were constantly bleeding during the surgery. She has also noticed more bruising in the past few months.  The only new medications that she has started in June 2021 are biotin and some juice capsules which are supplements. No peripartum or postpartum bleeding complications.  No family history of hematological disorders.  Menstrual cycles were regular and normal according to patient. No dental complications with bleeding. Physical examination no major bruises today, no palpable lymphadenopathy or hepatosplenomegaly, deformities noted from rheumatoid arthritis. Given history, I do not believe this  is a congenital disorder, most likely acquired secondary to chronic prednisone use, supplements and lack of connective tissue support given her autoimmune disease I recommended that we do some basic work-up for bleeding diathesis including CBC, CMP, INR, PTT, von Willebrand factor and vitamin K levels. She is here for FU, PT and PTT normal, PFA normal, no evidence of Von willebrand disease. At this time, we dont have any lead for further evaluation for bleeding diathesis. I explained this to the patient.  Then from a risk of breast cancer, her life time risk per Tobey Bride cuzick model is 13%, she doesn't qualify for MR screening at this time. Encouraged SBE and Mammograms annually.  I offered her a FU to check back on anemia, she would like to FU with her PCP at this time.  She can FU with [place] as needed.  Thank you for consulting on the care of this patient.  Please not hesitate to contact us with any additional questions or concerns.  HISTORY OF PRESENTING ILLNESS:  Anne Garrett 44 y.o. female is here because of easy bruising and recent surgery for right shoulder when it was noted that she has excessive bleeding from all subcutaneous surfaces.  Patient arrived to the appointment today by herself.  She mentions that in the past she definitely noticed more bruising.  Even with minor trauma she has noticed that she tends to have bruises such as barely touching a surface.  She has been on prednisone pretty much all her life, was diagnosed with rheumatoid arthritis at the age of 37 and hence this is not a change.  She has started taking some biotin supplementation and some juice capsules from about mid 2021.  She does not take any other blood thinners.  She has had multitude of surgeries in the  past and never had any complication.  In March 2021, she did have foot surgery did not result in any excessive bleeding complications.  She never required any blood transfusion from this bleeding issues prior.   She has 2 kids, no postpartum bleeding complications.  No family history of bleeding disorders.  She denies taking any NSAIDs she has garlic, gingko.  Bruises sometimes lasts a long time.  She does not have any unusual bruising today except for single small bruise on her left upper arm. She is having some difficulty obtaining tocilizumab for her rheumatoid arthritis.  She otherwise has tried methotrexate in the past.  Besides rheumatoid arthritis and its complications, she has felt well overall. Hair loss and brittle nails have improved since cessation of methotrexate. Eats a balanced diet hence less concern for nutritional deficiencies.  Interim History  No new complaints since last visit Shoulder healing well No new bruises or bleeds. PMH, PSH, SH and FH reviewed and no change.  REVIEW OF SYSTEMS:    Constitutional: Denies fevers, chills or abnormal night sweats.  She feels she is perimenopausal, she feels warm all the time Eyes: Denies blurriness of vision, double vision or watery eyes, she wears contacts. Ears, nose, mouth, throat, and face: Denies mucositis or sore throat Respiratory: Denies cough, dyspnea or wheezes Cardiovascular: Denies palpitation, chest discomfort or lower extremity swelling Gastrointestinal:  Denies nausea, heartburn or change in overall.  Rest of the review of systems as mentioned below habits Skin: Denies abnormal skin rashes Lymphatics: Denies new lymphadenopathy or easy bruising Neurological:Denies numbness, tingling or new weaknesses Behavioral/Psych: Mood is stable, no new changes  All other systems were reviewed with the patient and are negative.  MEDICAL HISTORY:  Past Medical History:  Diagnosis Date  . Adrenal insufficiency ([place])    secondary to Prednisone  . Eczema    coner oif eye lid  . Headache    Migraine  . Intervertebral disk disease   . Juvenile idiopathic arthritis ([place])   . Osteoporosis   . Rheumatoid arthritis ([place])    Juvenile  onset    SURGICAL HISTORY: Past Surgical History:  Procedure Laterality Date  . ANKLE ARTHROPLASTY Right   . ANTERIOR CERVICAL DECOMP/DISCECTOMY FUSION N/A 06/19/2015   Procedure: C3-4 C4-5 Anterior cervical decompression/diskectomy/fusion;  Surgeon: Jovita Gamma, MD;  Location: [place] [facility];  Service: Neurosurgery;  Laterality: N/A;  C3-4 C4-5 Anterior cervical decompression/diskectomy/fusion  . APPENDECTOMY    . CARPAL TUNNEL RELEASE Right   . CESAREAN SECTION    . CHOLECYSTECTOMY    . CHOLECYSTECTOMY  2006  . COLONOSCOPY    . ESSURE TUBAL LIGATION     Essure Implant-Permanent birth control  . excision to palm Left    excision of mass to right plam  . FOOT ARTHRODESIS Left 03/01/2019   Procedure: Left talonavicular and naviculocuneiform arthrodesis;  Surgeon: [name], MD;  Location: [place];  Service: Orthopedics;  Laterality: Left;  . HARDWARE REMOVAL Right 02/02/2017   Procedure: RIGHT WRIST DEEP IMPLANT REMOVAL, RIGHT WRIST AND HAND EXTENSOR TENDON RECONSTRUCTION, TENDON TRANSFER AND RECONSTRUCTION AS INDICATED;  Surgeon: Iran Planas, MD;  Location: [facility];  Service: Orthopedics;  Laterality: Right;  . IRRIGATION AND DEBRIDEMENT SHOULDER Right 11/13/2019   Procedure: IRRIGATION AND DEBRIDEMENT RIGHT SHOULDER WITH WOUND CLOSURE;  Surgeon: [name], MD;  Location: [place] [facility];  Service: Orthopedics;  Laterality: Right;  29min  . JOINT REPLACEMENT Bilateral    bilat hip replacements  . ORIF FEMUR FRACTURE Right   .  OTHER SURGICAL HISTORY  06/04/2016   Revision of PLIF  . plif l5-s1 arthrodeisis    2018  . POSTERIOR CERVICAL FUSION/FORAMINOTOMY N/A 07/12/2015   Procedure: C2 to C5 Cervical laminectomy, C2 to C5 posterior cervical arthrodesis with instrumentation and bone graft;  Surgeon: Jovita Gamma, MD;  Location: [place] [facility];  Service: Neurosurgery;  Laterality: N/A;  C2 to C5 Cervical laminectomy, C2  to C5 posterior cervical arthrodesis with  instrumentation and bone graft  . REPAIR EXTENSOR TENDON Right 02/02/2017   Procedure: REPAIR EXTENSOR TENDON;  Surgeon: Iran Planas, MD;  Location: [place];  Service: Orthopedics;  Laterality: Right;  . REVERSE SHOULDER ARTHROPLASTY Left 09/07/2018   Procedure: REVERSE SHOULDER ARTHROPLASTY;  Surgeon: [name], MD;  Location: [facility];  Service: Orthopedics;  Laterality: Left;  . REVERSE SHOULDER ARTHROPLASTY Right 09/20/2019   Procedure: REVERSE SHOULDER ARTHROPLASTY;  Surgeon: [name], MD;  Location: [facility];  Service: Orthopedics;  Laterality: Right;  143min  . REVISION TOTAL SHOULDER TO REVERSE TOTAL SHOULDER Right 11/01/2019   Procedure: revision right total shoulder to reverse total shoulder;  Surgeon: [name], MD;  Location: [facility];  Service: Orthopedics;  Laterality: Right;  exparel block  . right foot surgery Right    right jfoot arthrodesis and right ankle arthroplasty  . WRIST SURGERY Bilateral    arthrodesis    SOCIAL HISTORY: Social History   Socioeconomic History  . Marital status: Single    Spouse name: Not on file  . Number of children: 2  . Years of education: 35  . Highest education level: Not on file  Occupational History  . Occupation: Compliance Furniture conservator/restorer: [employer]  Tobacco Use  . Smoking status: Former Smoker    Packs/day: 0.25    Years: 20.00    Pack years: 5.00    Types: Cigarettes    Quit date: 07/02/2015    Years since quitting: 4.4  . Smokeless tobacco: Never Used  Vaping Use  . Vaping Use: Never used  Substance and Sexual Activity  . Alcohol use: Yes    Alcohol/week: 2.0 standard drinks    Types: 2 Glasses of wine per week    Comment: social  . Drug use: No  . Sexual activity: Yes    Birth control/protection: Other-see comments    Comment: essure coils  Other Topics Concern  . Not on file  Social History Narrative   Lives at home w/ 2 cats and child every other weekend   Significant other - Gerald Stabs   Right-hand    Drinks about 2 cups of coffee or soda per day   Social Determinants of Radio broadcast assistant Strain:   . Difficulty of Paying Living Expenses: Not on file  Food Insecurity:   . Worried About Charity fundraiser in the Last Year: Not on file  . Ran Out of Food in the Last Year: Not on file  Transportation Needs:   . Lack of Transportation (Medical): Not on file  . Lack of Transportation (Non-Medical): Not on file  Physical Activity:   . Days of Exercise per Week: Not on file  . Minutes of Exercise per Session: Not on file  Stress:   . Feeling of Stress : Not on file  Social Connections:   . Frequency of Communication with Friends and Family: Not on file  . Frequency of Social Gatherings with Friends and Family: Not on file  . Attends Religious Services: Not on file  .  Active Member of Clubs or Organizations: Not on file  . Attends Archivist Meetings: Not on file  . Marital Status: Not on file  Intimate Partner Violence:   . Fear of Current or Ex-Partner: Not on file  . Emotionally Abused: Not on file  . Physically Abused: Not on file  . Sexually Abused: Not on file    FAMILY HISTORY: Family History  Problem Relation Age of Onset  . Breast cancer Mother 20  . Nephritis Father   . Epilepsy Father   . Breast cancer Maternal Aunt        2 mat aunts   . Multiple sclerosis Maternal Uncle   . Psoriasis Paternal Aunt   . Diabetes Daughter     ALLERGIES:  is allergic to sulfa antibiotics, teriparatide, valproic acid and related, celexa [citalopram], diflucan [fluconazole], leflunomide, paroxetine hcl, and zoloft [sertraline].  MEDICATIONS:  Current Outpatient Medications  Medication Sig Dispense Refill  . Biotin w/ Vitamins C & E (HAIR/SKIN/NAILS PO) Take 1 tablet by mouth daily.    . Butalbital-APAP-Caffeine 50-325-40 MG capsule Take 1 capsule by mouth daily as needed. (Patient taking differently: Take 1 capsule by mouth daily as needed (Migraines). ) 30  capsule 0  . Calcium Carb-Cholecalciferol (CALCIUM+D3 PO) Take 1 tablet by mouth daily.    . Cholecalciferol (VITAMIN D3) 50 MCG (2000 UT) TABS Take 2,000 Units by mouth daily.    . cyclobenzaprine (FLEXERIL) 10 MG tablet Take 1 tablet (10 mg total) by mouth 3 (three) times daily as needed. 30 tablet 1  . OVER THE COUNTER MEDICATION Take 1 capsule by mouth daily. Juice plus Omega/Vegetable    . predniSONE (DELTASONE) 5 MG tablet Take 5 mg by mouth every evening.     Marland Kitchen PRESCRIPTION MEDICATION Apply 1 application topically See admin instructions. Apply dime-size amount to face 1-2 TIMES per day  Metronidazole 1% + Ivermectin 1% + Azelaic Acid 15%    . rizatriptan (MAXALT-MLT) 10 MG disintegrating tablet Take 1 tablet (10 mg total) by mouth daily as needed for migraine. May repeat in 2 hours if needed 10 tablet 2  . Tocilizumab (ACTEMRA ACTPEN) 162 MG/0.9ML SOAJ As directed subcutaneous weekly. 3.6 mL 2  . traMADol (ULTRAM) 50 MG tablet Take 100 mg by mouth 2 (two) times daily.     Marland Kitchen triamcinolone cream (KENALOG) 0.1 % Apply 1 application topically 2 (two) times daily as needed. (Patient taking differently: Apply 1 application topically 2 (two) times daily as needed (eczema). ) 15 g 1   No current facility-administered medications for this visit.     PHYSICAL EXAMINATION: ECOG PERFORMANCE STATUS: 0 - Asymptomatic  Vitals:   12/07/19 1211  BP: 134/84  Pulse: 83  Resp: 18  Temp: 97.9 F (36.6 C)  SpO2: 99%   Filed Weights   12/07/19 1211  Weight: 123 lb 1.6 oz (55.8 kg)    GENERAL:alert, no distress and comfortable No new bruises BLE with scars and broken veins. ROM limited by RA.  LABORATORY DATA:  I have reviewed the data as listed Lab Results  Component Value Date   WBC 7.3 11/23/2019   HGB 9.3 (L) 11/23/2019   HCT 29.6 (L) 11/23/2019   MCV 98.0 11/23/2019   PLT 381 11/23/2019     Chemistry      Component Value Date/Time   NA 139 11/23/2019 0841   NA 139 04/30/2019  0000   K 3.6 11/23/2019 0841   CL 106 11/23/2019 0841   CO2  25 11/23/2019 0841   BUN 5 (L) 11/23/2019 0841   BUN 12 07/30/2019 0000   CREATININE 0.63 11/23/2019 0841   GLU 70 07/30/2019 0000      Component Value Date/Time   CALCIUM 8.6 (L) 11/23/2019 0841   ALKPHOS 82 11/23/2019 0841   AST 14 (L) 11/23/2019 0841   ALT 11 11/23/2019 0841   BILITOT 0.2 (L) 11/23/2019 0841     Reviewed pertinent labs. Reviewed Harriett Rush risk.  RADIOGRAPHIC STUDIES: I have personally reviewed the radiological images as listed and agreed with the findings in the report. No results found.  All questions were answered. The patient knows to call the clinic with any problems, questions or concerns. I have spent a total of 20 minutes in the care of this patient including history and physical examination, review of medical records, counseling about causes of easy bruising, work-up and coordination of care.     Benay Pike, MD 12/07/2019 1:06 PM

## 2019-12-17 ENCOUNTER — Ambulatory Visit: Admit: 2019-12-17 | Payer: 59 | Admitting: Orthopedic Surgery

## 2019-12-17 SURGERY — ARTHROPLASTY, KNEE, TOTAL
Anesthesia: Choice | Site: Knee | Laterality: Left

## 2019-12-23 ENCOUNTER — Encounter: Payer: Self-pay | Admitting: Family Medicine

## 2019-12-24 ENCOUNTER — Other Ambulatory Visit: Payer: Self-pay | Admitting: Family Medicine

## 2019-12-24 MED FILL — BUTALBIT-ACETAMINOPHEN-CAFF: 50-325-40 | 30 days supply | Qty: 30 | Fill #0

## 2019-12-24 MED FILL — CYCLOBENZAPRINE HCL 10 MG T: 10 | 10 days supply | Qty: 30 | Fill #0

## 2019-12-24 MED FILL — traMADol HCL 50 MG TABS: 50 | 30 days supply | Qty: 120 | Fill #0

## 2019-12-27 ENCOUNTER — Encounter: Payer: Self-pay | Admitting: Family Medicine

## 2020-01-01 DIAGNOSIS — L218 Other seborrheic dermatitis: Secondary | ICD-10-CM | POA: Diagnosis not present

## 2020-01-01 DIAGNOSIS — L718 Other rosacea: Secondary | ICD-10-CM | POA: Diagnosis not present

## 2020-01-01 DIAGNOSIS — B081 Molluscum contagiosum: Secondary | ICD-10-CM | POA: Diagnosis not present

## 2020-01-01 MED FILL — TRIAMCINOLONE 0.1% OINTMENT: 0.1 | 7 days supply | Qty: 15 | Fill #0

## 2020-01-01 MED FILL — TACROLIMUS 0.1% OINTMENT: 0.1 | 14 days supply | Qty: 30 | Fill #0

## 2020-01-02 DIAGNOSIS — M25611 Stiffness of right shoulder, not elsewhere classified: Secondary | ICD-10-CM | POA: Diagnosis not present

## 2020-01-08 ENCOUNTER — Ambulatory Visit: Payer: 59 | Admitting: Family Medicine

## 2020-01-09 DIAGNOSIS — H5213 Myopia, bilateral: Secondary | ICD-10-CM | POA: Diagnosis not present

## 2020-01-11 DIAGNOSIS — M25611 Stiffness of right shoulder, not elsewhere classified: Secondary | ICD-10-CM | POA: Diagnosis not present

## 2020-01-15 DIAGNOSIS — M25611 Stiffness of right shoulder, not elsewhere classified: Secondary | ICD-10-CM | POA: Diagnosis not present

## 2020-01-17 DIAGNOSIS — M25611 Stiffness of right shoulder, not elsewhere classified: Secondary | ICD-10-CM | POA: Diagnosis not present

## 2020-01-28 ENCOUNTER — Other Ambulatory Visit (HOSPITAL_COMMUNITY): Payer: Self-pay | Admitting: Physician Assistant

## 2020-01-28 DIAGNOSIS — M25611 Stiffness of right shoulder, not elsewhere classified: Secondary | ICD-10-CM | POA: Diagnosis not present

## 2020-01-28 MED FILL — traMADol HCL 50 MG TABS: 50 | 30 days supply | Qty: 120 | Fill #0

## 2020-01-30 DIAGNOSIS — M25611 Stiffness of right shoulder, not elsewhere classified: Secondary | ICD-10-CM | POA: Diagnosis not present

## 2020-02-01 DIAGNOSIS — M150 Primary generalized (osteo)arthritis: Secondary | ICD-10-CM | POA: Diagnosis not present

## 2020-02-01 DIAGNOSIS — M0579 Rheumatoid arthritis with rheumatoid factor of multiple sites without organ or systems involvement: Secondary | ICD-10-CM | POA: Diagnosis not present

## 2020-02-01 DIAGNOSIS — M2550 Pain in unspecified joint: Secondary | ICD-10-CM | POA: Diagnosis not present

## 2020-02-01 DIAGNOSIS — Z79899 Other long term (current) drug therapy: Secondary | ICD-10-CM | POA: Diagnosis not present

## 2020-02-01 DIAGNOSIS — Z6824 Body mass index (BMI) 24.0-24.9, adult: Secondary | ICD-10-CM | POA: Diagnosis not present

## 2020-02-01 DIAGNOSIS — M810 Age-related osteoporosis without current pathological fracture: Secondary | ICD-10-CM | POA: Diagnosis not present

## 2020-02-01 MED FILL — ACTEMRA ACTPEN 162 MG/0.9ML: 162 | 28 days supply | Qty: 4 | Fill #0

## 2020-02-04 ENCOUNTER — Ambulatory Visit: Payer: 59 | Admitting: Family Medicine

## 2020-02-04 ENCOUNTER — Other Ambulatory Visit: Payer: Self-pay

## 2020-02-04 ENCOUNTER — Encounter: Payer: Self-pay | Admitting: Family Medicine

## 2020-02-04 VITALS — BP 120/80 | HR 97 | Temp 98.0°F | Resp 15 | Ht 59.0 in | Wt 122.6 lb

## 2020-02-04 DIAGNOSIS — M4802 Spinal stenosis, cervical region: Secondary | ICD-10-CM | POA: Diagnosis not present

## 2020-02-04 DIAGNOSIS — Z7952 Long term (current) use of systemic steroids: Secondary | ICD-10-CM | POA: Diagnosis not present

## 2020-02-04 DIAGNOSIS — M48061 Spinal stenosis, lumbar region without neurogenic claudication: Secondary | ICD-10-CM

## 2020-02-04 DIAGNOSIS — G43009 Migraine without aura, not intractable, without status migrainosus: Secondary | ICD-10-CM

## 2020-02-04 DIAGNOSIS — M818 Other osteoporosis without current pathological fracture: Secondary | ICD-10-CM | POA: Diagnosis not present

## 2020-02-04 DIAGNOSIS — M0800 Unspecified juvenile rheumatoid arthritis of unspecified site: Secondary | ICD-10-CM | POA: Diagnosis not present

## 2020-02-04 DIAGNOSIS — D84821 Immunodeficiency due to drugs: Secondary | ICD-10-CM | POA: Diagnosis not present

## 2020-02-04 DIAGNOSIS — T380X5A Adverse effect of glucocorticoids and synthetic analogues, initial encounter: Secondary | ICD-10-CM | POA: Diagnosis not present

## 2020-02-04 DIAGNOSIS — K219 Gastro-esophageal reflux disease without esophagitis: Secondary | ICD-10-CM

## 2020-02-04 NOTE — Patient Instructions (Signed)
Please return in 12 months for your annual complete physical; please come fasting.   If you have any questions or concerns, please don't hesitate to send me a message via MyChart or call the office at [phone]. Thank you for visiting with [place] today! It's our pleasure caring for you.  I have ordered a mammogram and/or bone density for you as we discussed today: []   Mammogram  [x]   Bone Density  Please call the office checked below to schedule your appointment: Your appointment will at the following location  [x]   [facility]      [place], [place]         []   [facility]  [address] [place], [place]

## 2020-02-04 NOTE — Progress Notes (Signed)
Subjective  CC:  Chief Complaint  Patient presents with  . Ear Fullness    Right ear  . Migraine    Based around menstrual cycles - mostly the week prior to period, maxalt helpful but felt "off"    HPI: Anne Garrett is a 45 y.o. female who presents to the office today to address the problems listed above in the chief complaint.  45 year old female with rheumatoid arthritis: Had right shoulder surgery last year.  Fortunately this is starting to improve.  Is scheduled for left knee surgery later this year.  She will need medical clearance.  She sees rheumatology regular.  On chronic prednisone therapy.  She reports that is overall stable.  Pain due to cervical and lumbar stenosis on daily tramadol.  Managed by specialist.  No changes.  Migraines, menstrual related.  Maxalt helped headache but made her feel off.  She will talk to gynecology in a few weeks about possible HRT for birth control to help manage her migraines.  No change in the intensity or frequency of migraines.  GERD intermittent.  Complains of intermittent right ear fullness.  Denies allergy symptoms.  No postnasal drainage or congestion.  No pain.  No trauma.  Osteoporosis: Due to chronic steroids.  Reviewed most recent bone density scan from 2018.  She had been on Boniva for 5 years prior to that.  Has been on a drug holiday.  Due for recheck.  Does take calcium and vitamin D.  Health maintenance: Discussed annual exam here in the office and female wellness with GYN.  She will schedule next year.  Lab work up-to-date and reviewed.  Immunizations up-to-date.   Assessment  1. Other osteoporosis without current pathological fracture   2. Juvenile rheumatoid arthritis ([place])   3. 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 Gastroesophageal reflux disease, unspecified whether esophagitis present   5. Cervical stenosis of spinal canal   6. Spinal stenosis of lumbar region, unspecified whether neurogenic  claudication present   7. Migraine without aura and without status migrainosus, not intractable      Plan   Osteoporosis: Recheck bone density.  Will likely need to restart antiresorptive.  Rheumatoid arthritis on chronic steroids for rheumatology.  Stable.  Chronic pain due to cervical spinal stenosis on daily tramadol.  Controlling pain well.  Ultimately, she is hoping that the knee surgery will help decrease her back pain.  Then she like to wean off the tramadol  Menstrual related migraines: To discuss with GYN possible hormonal manipulation.  Once off tramadol she will likely be able to tolerate the Maxalt better.  Occasional Fioricet use in the meantime for abortive therapy.   Follow up: 12 months for complete physical Visit date not found  Orders Placed This Encounter  Procedures  . DG Bone Density   No orders of the defined types were placed in this encounter.     I reviewed the patients updated PMH, FH, and SocHx.    Patient Active Problem List   Diagnosis Date Noted  . Status post evacuation of hematoma 11/13/2019  . Juvenile rheumatoid arthritis ([place]) 05/13/2019  . Family history of breast cancer 05/13/2019  . GERD (gastroesophageal reflux disease) 05/13/2019  . Migraines 05/13/2019  . Immunosuppression due to chronic steroid use ([place]) 05/13/2019  . Rosacea 05/13/2019  . 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 Lumbar pseudoarthrosis 06/04/2016  . Lumbar stenosis 04/08/2016  . S/P cervical spinal fusion 07/13/2015  . Cervical  stenosis of spinal canal 07/12/2015  . HNP (herniated nucleus pulposus), cervical 06/19/2015  . VAIN I (vaginal intraepithelial neoplasia grade I) 08/24/2013  . LGSIL (low grade squamous intraepithelial dysplasia) 08/20/2013  . H/O Clostridium difficile infection 01/16/2013  . Hemangioma 01/16/2013  . Secondary adrenal insufficiency ([place]) 01/16/2013  . Osteoporosis 12/28/2012   Current Meds  Medication Sig  .  Biotin w/ Vitamins C & E (HAIR/SKIN/NAILS PO) Take 1 tablet by mouth daily.  . Butalbital-APAP-Caffeine 50-325-40 MG capsule TAKE 1 CAPSULE BY MOUTH DAILY AS NEEDED.  . Calcium Carb-Cholecalciferol (CALCIUM+D3 PO) Take 1 tablet by mouth daily.  . Cholecalciferol (VITAMIN D3) 50 MCG (2000 UT) TABS Take 2,000 Units by mouth daily.  . cyclobenzaprine (FLEXERIL) 10 MG tablet Take 1 tablet (10 mg total) by mouth 3 (three) times daily as needed.  Marland Kitchen OVER THE COUNTER MEDICATION Take 1 capsule by mouth daily. Juice plus Omega/Vegetable  . predniSONE (DELTASONE) 5 MG tablet Take 5 mg by mouth every evening.   Marland Kitchen PRESCRIPTION MEDICATION Apply 1 application topically See admin instructions. Apply dime-size amount to face 1-2 TIMES per day  Metronidazole 1% + Ivermectin 1% + Azelaic Acid 15%  . Tocilizumab (ACTEMRA ACTPEN) 162 MG/0.9ML SOAJ As directed subcutaneous weekly.  . traMADol (ULTRAM) 50 MG tablet Take 100 mg by mouth 2 (two) times daily.   Marland Kitchen triamcinolone cream (KENALOG) 0.1 % Apply 1 application topically 2 (two) times daily as needed. (Patient taking differently: Apply 1 application topically 2 (two) times daily as needed (eczema).)    Allergies: Patient is allergic to sulfa antibiotics, teriparatide, valproic acid and related, celexa [citalopram], diflucan [fluconazole], leflunomide, paroxetine hcl, and zoloft [sertraline]. Family History: Patient family history includes Breast cancer in her maternal aunt; Breast cancer (age of onset: 53) in her mother; Diabetes in her daughter; Epilepsy in her father; Multiple sclerosis in her maternal uncle; Nephritis in her father; Psoriasis in her paternal aunt. Social History:  Patient  reports that she quit smoking about 4 years ago. Her smoking use included cigarettes. She has a 5.00 pack-year smoking history. She has never used smokeless tobacco. She reports current alcohol use of about 2.0 standard drinks of alcohol per week. She reports that she does not  use drugs.  Review of Systems: Constitutional: Negative for fever malaise or anorexia Cardiovascular: negative for chest pain Respiratory: negative for SOB or persistent cough Gastrointestinal: negative for abdominal pain  Objective  Vitals: BP 120/80   Pulse 97   Temp 98 F (36.7 C) (Temporal)   Resp 15   Ht 4\' 9"  (1.448 m)   Wt 122 lb 9.6 oz (55.6 kg)   LMP 02/01/2020   SpO2 98%   BMI 26.53 kg/m  General: no acute distress , A&Ox3 HEENT: [place]ER[place], conjunctiva normal, neck is supple, bilateral TMs normal no effusions Cardiovascular:  RRR without murmur or gallop.  Respiratory:  Good breath sounds bilaterally, CTAB with normal respiratory effort Skin:  Warm, no rashes     Commons side effects, risks, benefits, and alternatives for medications and treatment plan prescribed today were discussed, and the patient expressed understanding of the given instructions. Patient is instructed to call or message via MyChart if he/she has any questions or concerns regarding our treatment plan. No barriers to understanding were identified. We discussed Red Flag symptoms and signs in detail. Patient expressed understanding regarding what to do in case of urgent or emergency type symptoms.   Medication list was reconciled, printed and provided to the patient in AVS.  Patient instructions and summary information was reviewed with the patient as documented in the AVS. This note was prepared with assistance of Dragon voice recognition software. Occasional wrong-word or sound-a-like substitutions may have occurred due to the inherent limitations of voice recognition software  This visit occurred during the SARS-CoV-2 public health emergency.  Safety protocols were in place, including screening questions prior to the visit, additional usage of staff PPE, and extensive cleaning of exam room while observing appropriate contact time as indicated for disinfecting solutions.

## 2020-02-05 ENCOUNTER — Other Ambulatory Visit: Payer: Self-pay | Admitting: Family Medicine

## 2020-02-05 DIAGNOSIS — M818 Other osteoporosis without current pathological fracture: Secondary | ICD-10-CM

## 2020-02-06 DIAGNOSIS — Z1231 Encounter for screening mammogram for malignant neoplasm of breast: Secondary | ICD-10-CM | POA: Diagnosis not present

## 2020-02-06 DIAGNOSIS — Z01419 Encounter for gynecological examination (general) (routine) without abnormal findings: Secondary | ICD-10-CM | POA: Diagnosis not present

## 2020-02-06 DIAGNOSIS — R87619 Unspecified abnormal cytological findings in specimens from cervix uteri: Secondary | ICD-10-CM | POA: Diagnosis not present

## 2020-02-06 DIAGNOSIS — Z124 Encounter for screening for malignant neoplasm of cervix: Secondary | ICD-10-CM | POA: Diagnosis not present

## 2020-02-08 DIAGNOSIS — Z96611 Presence of right artificial shoulder joint: Secondary | ICD-10-CM | POA: Diagnosis not present

## 2020-02-08 DIAGNOSIS — Z471 Aftercare following joint replacement surgery: Secondary | ICD-10-CM | POA: Diagnosis not present

## 2020-02-12 ENCOUNTER — Encounter: Payer: Self-pay | Admitting: Family Medicine

## 2020-02-15 ENCOUNTER — Other Ambulatory Visit: Payer: Self-pay | Admitting: Family Medicine

## 2020-02-15 MED ORDER — NYSTATIN 100000 UNIT/ML MT SUSP: 5.0000 mL | Freq: Four times a day (QID) | OROMUCOSAL | 0 refills | Status: DC

## 2020-02-15 MED ORDER — CYCLOBENZAPRINE HCL 10 MG PO TABS: 10.0000 mg | ORAL_TABLET | Freq: Three times a day (TID) | ORAL | 2 refills | Status: DC | PRN

## 2020-02-15 MED FILL — NYSTATIN 100000 UNIT/ML SUS: 100000 | 24 days supply | Qty: 473 | Fill #0

## 2020-02-15 MED FILL — CYCLOBENZAPRINE HCL 10 MG T: 10 | 20 days supply | Qty: 60 | Fill #0

## 2020-02-25 MED FILL — predniSONE 5 MG TABS: 5 | 90 days supply | Qty: 90 | Fill #1

## 2020-02-28 MED FILL — ACTEMRA ACTPEN 162 MG/0.9ML: 162 | 28 days supply | Qty: 4 | Fill #1

## 2020-02-29 ENCOUNTER — Other Ambulatory Visit (HOSPITAL_COMMUNITY): Payer: Self-pay | Admitting: Physician Assistant

## 2020-02-29 MED FILL — traMADol HCL 50 MG TABS: 50 | 30 days supply | Qty: 120 | Fill #0

## 2020-03-02 ENCOUNTER — Encounter: Payer: Self-pay | Admitting: Family Medicine

## 2020-03-15 IMAGING — MR MR HEAD WO/W CM
12 series · 48 of 48 positions shown · IV contrast (multihance)
Comparison: MRI head 05/15/2015

CLINICAL DATA: Headache.  Right facial numbness 2 weeks

EXAM:
MRI HEAD WITHOUT AND WITH CONTRAST
TECHNIQUE: Multiplanar, multiecho pulse sequences of the brain and surrounding
structures were obtained without and with intravenous contrast.
CONTRAST:  10mL MULTIHANCE GADOBENATE DIMEGLUMINE 529 MG/ML IV SOLN

[Series 5: T1 · sagittal · 4.0mm · 0.75mm/px · 1 of 30 slices shown (1 of 3)]
[im 1/30]
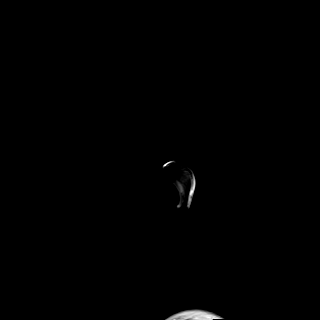

[Series 6: DWI · axial · 3.0mm · 1.44mm/px · z∈[-47,+88]mm · 5 of 84 slices shown (1 of 4)]
[im 1/84]
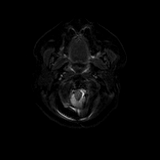
[im 21/84]
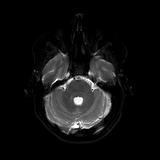
[im 42/84]
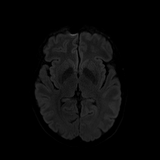
[im 63/84]
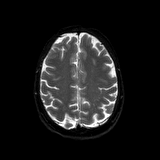
[im 84/84]
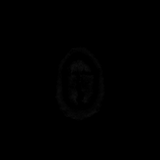

[Series 7: DWI · axial · 3.0mm · 1.44mm/px · z∈[-47,+88]mm · 2 of 35 slices shown (2 of 4)]
[im 1/35]
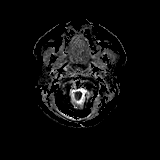
[im 35/35]
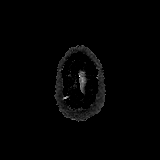

[Series 8: DWI · coronal · 5.0mm · 1.44mm/px · 4 of 60 slices shown (3 of 4)]
[im 1/60]
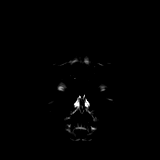
[im 20/60]
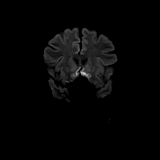
[im 40/60]
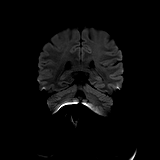
[im 60/60]
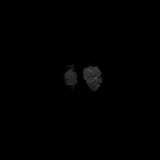

[Series 9: DWI · coronal · 5.0mm · 1.44mm/px · 2 of 30 slices shown (4 of 4)]
[im 1/30]
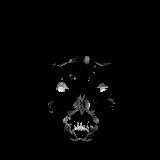
[im 30/30]
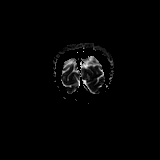

[Series 10: T2 · axial · 4.0mm · 0.36mm/px · z∈[-50,+85]mm · 2 of 27 slices shown]
[im 1/27]
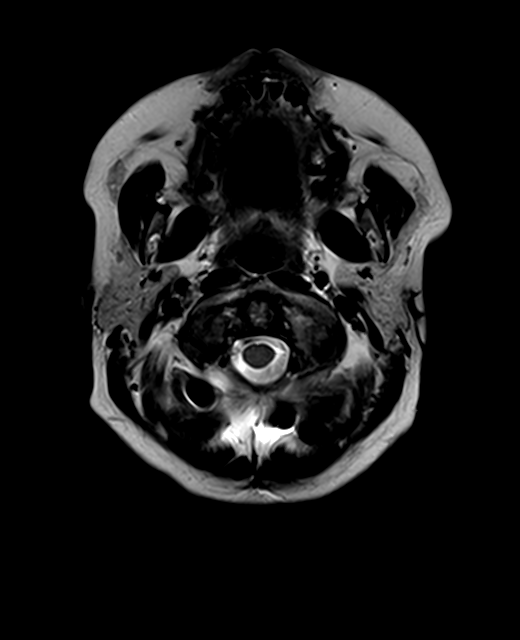
[im 27/27]
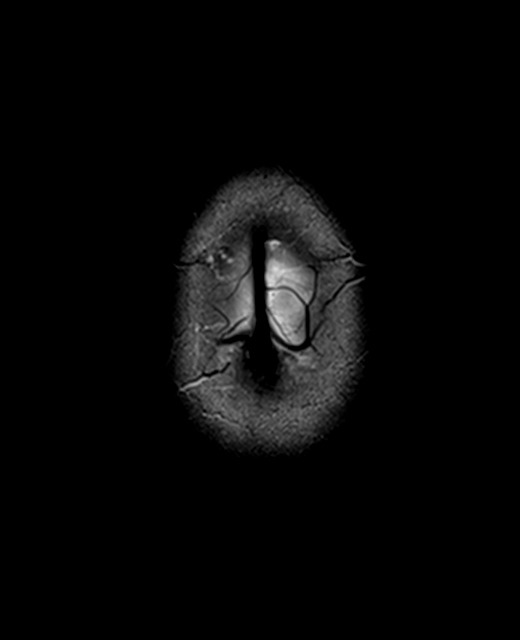

[Series 11: FLAIR · axial · 3.0mm · 0.72mm/px · z∈[-56,+93]mm · 2 of 26 slices shown]
[im 1/26]
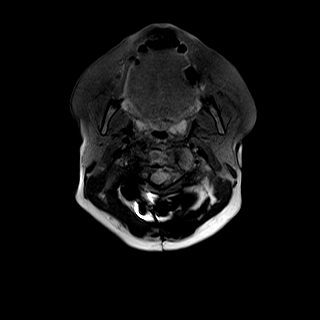
[im 26/26]
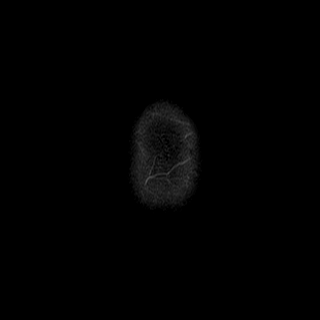

[Series 13: swi_images · axial · 1.5mm · 0.90mm/px · z∈[-53,+89]mm · 6 of 96 slices shown]
[im 1/96]
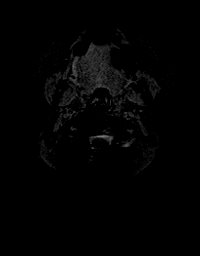
[im 20/96]
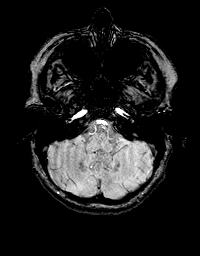
[im 39/96]
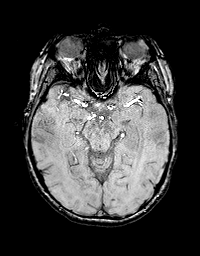
[im 58/96]
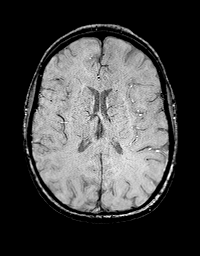
[im 77/96]
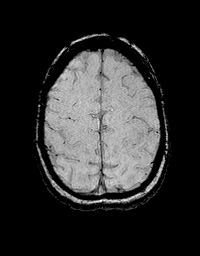
[im 96/96]
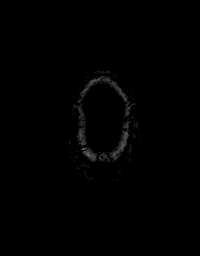

[Series 14: T1 · axial · 1.0mm · 0.94mm/px · z∈[-73,+85]mm · 10 of 160 slices shown (2 of 3)]
[im 1/160]
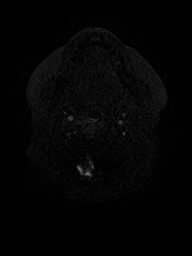
[im 18/160]
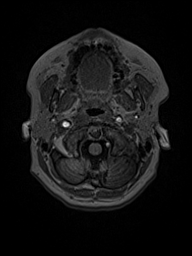
[im 36/160]
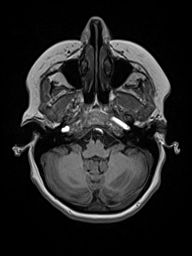
[im 54/160]
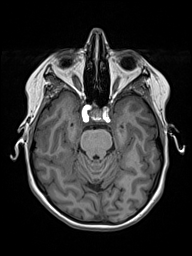
[im 71/160]
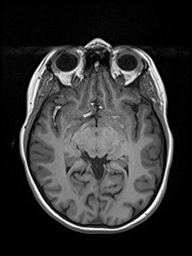
[im 89/160]
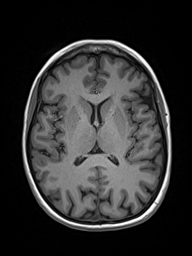
[im 107/160]
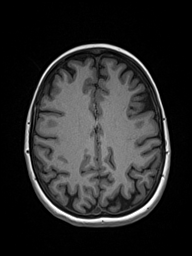
[im 124/160]
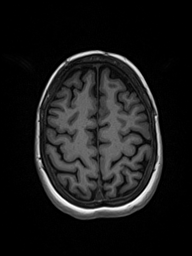
[im 142/160]
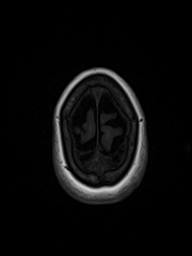
[im 160/160]
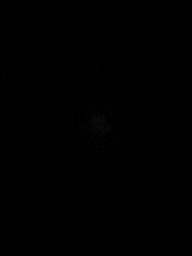

[Series 15: T2 post-contrast · coronal · 4.0mm · 0.36mm/px · 2 of 33 slices shown]
[im 1/33]
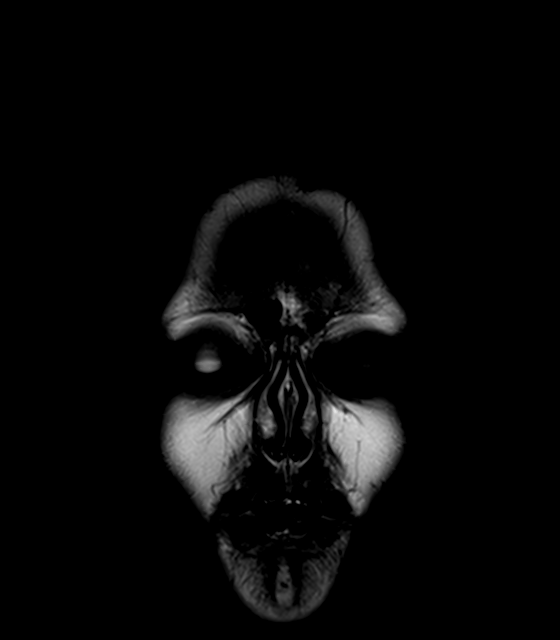
[im 33/33]
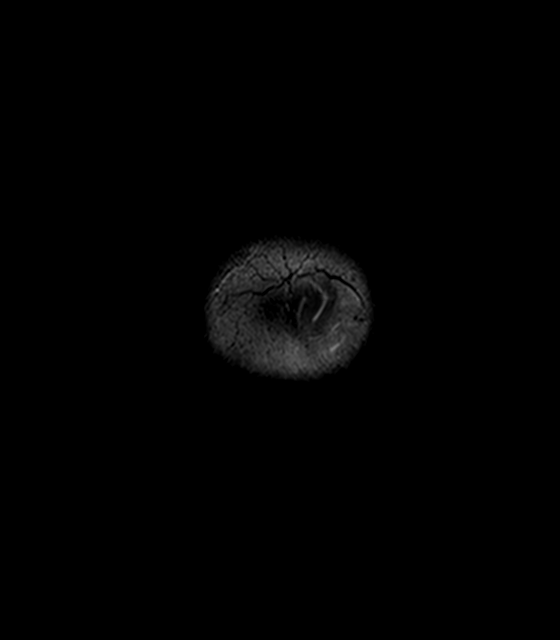

[Series 16: T1 · axial · 1.0mm · 0.94mm/px · z∈[-73,+85]mm · 10 of 160 slices shown (3 of 3)]
[im 1/160]
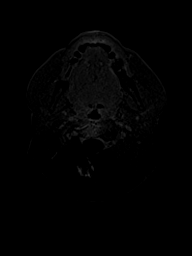
[im 18/160]
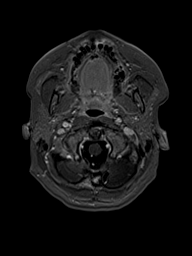
[im 36/160]
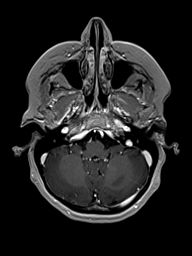
[im 54/160]
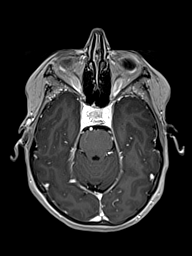
[im 71/160]
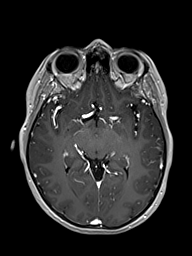
[im 89/160]
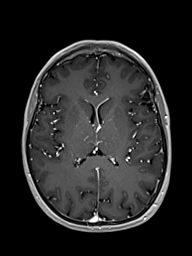
[im 107/160]
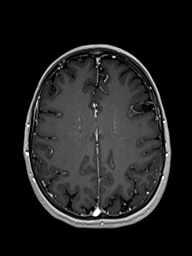
[im 124/160]
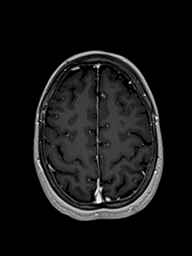
[im 142/160]
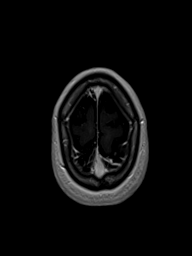
[im 160/160]
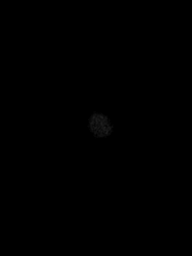

[Series 17: T1 post-contrast · coronal · 4.0mm · 0.72mm/px · 2 of 33 slices shown]
[im 1/33]
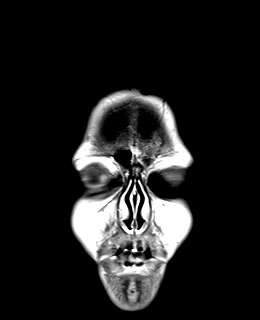
[im 33/33]
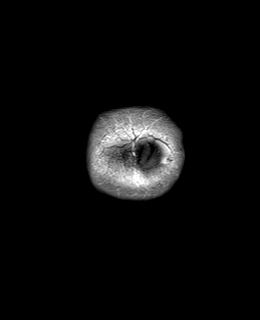

[48 of 48 positions shown; findings below may reference images not displayed]

FINDINGS: Brain: No acute infarction, hemorrhage, hydrocephalus, extra-axial
collection or mass lesion.

Normal white matter

Normal enhancement following contrast infusion.

Vascular: Normal arterial flow voids

Skull and upper cervical spine: No focal skeletal lesion. Hardware
related artifact due to anterior and posterior fusion in the upper
cervical spine.

Sinuses/Orbits: Negative

Other: None
IMPRESSION: Normal MRI brain with contrast.

## 2020-03-25 ENCOUNTER — Other Ambulatory Visit: Payer: Self-pay | Admitting: Family Medicine

## 2020-03-25 MED FILL — BUTALBIT-ACETAMINOPHEN-CAFF: 50-325-40 | 30 days supply | Qty: 30 | Fill #0

## 2020-03-27 MED FILL — ACTEMRA ACTPEN 162 MG/0.9ML: 162 | 14 days supply | Qty: 2 | Fill #2

## 2020-04-01 ENCOUNTER — Other Ambulatory Visit (HOSPITAL_COMMUNITY): Payer: Self-pay

## 2020-04-04 ENCOUNTER — Encounter: Payer: Self-pay | Admitting: Family Medicine

## 2020-04-07 ENCOUNTER — Other Ambulatory Visit: Payer: Self-pay

## 2020-04-07 ENCOUNTER — Encounter: Payer: Self-pay | Admitting: Family Medicine

## 2020-04-07 ENCOUNTER — Ambulatory Visit: Payer: 59 | Admitting: Family Medicine

## 2020-04-07 ENCOUNTER — Other Ambulatory Visit (HOSPITAL_COMMUNITY): Payer: Self-pay

## 2020-04-07 ENCOUNTER — Other Ambulatory Visit: Payer: Self-pay | Admitting: Physician Assistant

## 2020-04-07 VITALS — BP 120/82 | HR 99 | Temp 98.3°F | Wt 127.6 lb

## 2020-04-07 DIAGNOSIS — J358 Other chronic diseases of tonsils and adenoids: Secondary | ICD-10-CM

## 2020-04-07 DIAGNOSIS — R198 Other specified symptoms and signs involving the digestive system and abdomen: Secondary | ICD-10-CM | POA: Diagnosis not present

## 2020-04-07 DIAGNOSIS — R0989 Other specified symptoms and signs involving the circulatory and respiratory systems: Secondary | ICD-10-CM

## 2020-04-07 NOTE — Progress Notes (Signed)
DUE TO COVID-19 ONLY ONE VISITOR IS ALLOWED TO COME WITH YOU AND STAY IN THE WAITING ROOM ONLY DURING PRE OP AND PROCEDURE DAY OF SURGERY. THE 1 VISITOR  MAY VISIT WITH YOU AFTER SURGERY IN YOUR PRIVATE ROOM DURING VISITING HOURS ONLY!  YOU NEED TO HAVE A COVID 19 TEST ON__4/14/2022 _____ @_______ , THIS TEST MUST BE DONE BEFORE SURGERY,  COVID TESTING SITE [address]  [id number], IT IS ON THE RIGHT GOING OUT [address] APPROXIMATELY  2 MINUTES PAST [employer] ON THE RIGHT. ONCE YOUR COVID TEST IS COMPLETED,  PLEASE BEGIN THE QUARANTINE INSTRUCTIONS AS OUTLINED IN YOUR HANDOUT.                Anne Garrett  04/07/2020   Your procedure is scheduled on:  04/21/2020   Report to [facility] Main  Entrance   Report to admitting at      [place] AM     Call this number if you have problems the morning of surgery [phone]    REMEMBER: NO  SOLID FOOD CANDY OR GUM AFTER MIDNIGHT. CLEAR LIQUIDS UNTIL     0415am     . NOTHING BY MOUTH EXCEPT CLEAR LIQUIDS UNTIL     0415am   . PLEASE FINISH ENSURE DRINK PER SURGEON ORDER  WHICH NEEDS TO BE COMPLETED AT   0415am    .      CLEAR LIQUID DIET   Foods Allowed                                                                    Coffee and tea, regular and decaf                            Fruit ices (not with fruit pulp)                                      Iced Popsicles                                    Carbonated beverages, regular and diet                                    Cranberry, grape and apple juices Sports drinks like Gatorade Lightly seasoned clear broth or consume(fat free) Sugar, honey syrup ___________________________________________________________________      BRUSH YOUR TEETH MORNING OF SURGERY AND RINSE YOUR MOUTH OUT, NO CHEWING GUM CANDY OR MINTS.     Take these medicines the morning of surgery with A SIP OF WATER:  None  DO NOT TAKE ANY DIABETIC MEDICATIONS DAY OF YOUR  SURGERY                               You may not have any metal on your body including hair pins and  piercings  Do not wear jewelry, make-up, lotions, powders or perfumes, deodorant             Do not wear nail polish on your fingernails.  Do not shave  48 hours prior to surgery.              Men may shave face and neck.   Do not bring valuables to the hospital. [place].  Contacts, dentures or bridgework may not be worn into surgery.  Leave suitcase in the car. After surgery it may be brought to your room.     Patients discharged the day of surgery will not be allowed to drive home. IF YOU ARE HAVING SURGERY AND GOING HOME THE SAME DAY, YOU MUST HAVE AN ADULT TO DRIVE YOU HOME AND BE WITH YOU FOR 24 HOURS. YOU MAY GO HOME BY TAXI OR UBER OR ORTHERWISE, BUT AN ADULT MUST ACCOMPANY YOU HOME AND STAY WITH YOU FOR 24 HOURS.  Name and phone number of your driver:  Special Instructions: N/A              Please read over the following fact sheets you were given: _____________________________________________________________________  [facility] - Preparing for Surgery Before surgery, you can play an important role.  Because skin is not sterile, your skin needs to be as free of germs as possible.  You can reduce the number of germs on your skin by washing with CHG (chlorahexidine gluconate) soap before surgery.  CHG is an antiseptic cleaner which kills germs and bonds with the skin to continue killing germs even after washing. Please DO NOT use if you have an allergy to CHG or antibacterial soaps.  If your skin becomes reddened/irritated stop using the CHG and inform your nurse when you arrive at Short Stay. Do not shave (including legs and underarms) for at least 48 hours prior to the first CHG shower.  You may shave your face/neck. Please follow these instructions carefully:  1.  Shower with CHG Soap the night before surgery and the   morning of Surgery.  2.  If you choose to wash your hair, wash your hair first as usual with your  normal  shampoo.  3.  After you shampoo, rinse your hair and body thoroughly to remove the  shampoo.                           4.  Use CHG as you would any other liquid soap.  You can apply chg directly  to the skin and wash                       Gently with a scrungie or clean washcloth.  5.  Apply the CHG Soap to your body ONLY FROM THE NECK DOWN.   Do not use on face/ open                           Wound or open sores. Avoid contact with eyes, ears mouth and genitals (private parts).                       Wash face,  Genitals (private parts) with your normal soap.             6.  Wash thoroughly, paying special attention to the area where your surgery  will be performed.  7.  Thoroughly rinse your body with warm water from the neck down.  8.  DO NOT shower/wash with your normal soap after using and rinsing off  the CHG Soap.                9.  Pat yourself dry with a clean towel.            10.  Wear clean pajamas.            11.  Place clean sheets on your bed the night of your first shower and do not  sleep with pets. Day of Surgery : Do not apply any lotions/deodorants the morning of surgery.  Please wear clean clothes to the hospital/surgery center.  FAILURE TO FOLLOW THESE INSTRUCTIONS MAY RESULT IN THE CANCELLATION OF YOUR SURGERY PATIENT SIGNATURE_________________________________  NURSE SIGNATURE__________________________________  ________________________________________________________________________

## 2020-04-07 NOTE — Patient Instructions (Addendum)
Please follow up if symptoms do not improve or as needed.   I have placed a referral to ENT to get a bette look and hopefully help with your symptoms. In the meantime, start nightly zyrtec and let me know if your symptoms change.

## 2020-04-07 NOTE — Progress Notes (Signed)
Subjective  CC:  Chief Complaint  Patient presents with  . Dysphagia    See mychart message - c/o of "lump in throat and rotten taste in mouth," hx of tonsil stones    HPI: Anne Garrett is a 45 y.o. female who presents to the office today to address the problems listed above in the chief complaint.  45 year old with juvenile rheumatoid arthritis presents due to a sensation of something being back of her throat.  This started about 2 months ago.  Persist.  However, twice while she was at the dentist, while she was lying back in a chair, she had a sensation of blockage in the throat and felt like she was smothering.  She had a hard time breathing.  She felt like there was a mass in the back of her throat.  She denies allergy symptoms, postnasal drainage, sinus pain.  She has history of tonsilloliths in the noted the dislocation of one last week.  Since, she has not had that strange smell associated with tonsilloliths.  She denies sore throat or problems swallowing food although she does admit that since her neck surgery she has had intermittent problems with the sensation that food will get lodged in her throat.  This was thought to be related to her surgery.  She denies neurologic symptoms.  She denies cough or needing to clear her throat regularly.  She denies reflux symptoms.   Assessment  1. Globus sensation      Plan   Globus sensation: Exam is unremarkable today.  Will refer to ENT for better look.  Start Zyrtec nightly in case this is related to allergies and postnasal drainage.  May need swallowing study or GI referral.  Patient is also at risk for atlanto occipital dislocation due to RA; will f/u with ortho for further eval if needed.   Follow up: As needed, complete physical due in January 2022 Visit date not found  Orders Placed This Encounter  Procedures  . Ambulatory referral to ENT   No orders of the defined types were placed in this encounter.     I reviewed the  patients updated PMH, FH, and SocHx.    Patient Active Problem List   Diagnosis Date Noted  . Status post evacuation of hematoma 11/13/2019  . Juvenile rheumatoid arthritis ([place]) 05/13/2019  . Family history of breast cancer 05/13/2019  . GERD (gastroesophageal reflux disease) 05/13/2019  . Migraines 05/13/2019  . 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 Rosacea 05/13/2019  . 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 Lumbar pseudoarthrosis 06/04/2016  . Lumbar stenosis 04/08/2016  . S/P cervical spinal fusion 07/13/2015  . Cervical stenosis of spinal canal 07/12/2015  . HNP (herniated nucleus pulposus), cervical 06/19/2015  . VAIN I (vaginal intraepithelial neoplasia grade I) 08/24/2013  . H/O Clostridium difficile infection 01/16/2013  . Hemangioma 01/16/2013  . Secondary adrenal insufficiency ([place]) 01/16/2013  . Osteoporosis 12/28/2012   Current Meds  Medication Sig  . Biotin w/ Vitamins C & E (HAIR/SKIN/NAILS PO) Take 1 tablet by mouth daily.  . Butalbital-APAP-Caffeine 50-325-40 MG capsule TAKE 1 CAPSULE BY MOUTH DAILY AS NEEDED. (Patient taking differently: Take 1 capsule by mouth daily as needed for headache.)  . Calcium Carb-Cholecalciferol (CALCIUM+D3 PO) Take 1 tablet by mouth daily.  . Cholecalciferol (VITAMIN D3) 50 MCG (2000 UT) TABS Take 2,000 Units by mouth daily.  . cyclobenzaprine (FLEXERIL) 10 MG tablet TAKE 1 TABLET (10 MG TOTAL) BY MOUTH  3 (THREE) TIMES DAILY AS NEEDED.  Marland Kitchen cyclobenzaprine (FLEXERIL) 10 MG tablet TAKE 1 TABLET BY MOUTH TWICE DAILY AS NEEDED FOR SPASM  . nystatin (MYCOSTATIN) 100000 UNIT/ML suspension SWISH AND SPIT 5MLS BY MOUTH 4 TIMES DAILY AS DIRECTED (Patient taking differently: Take 5 mLs by mouth 4 (four) times daily as needed (yeast infection).)  . OVER THE COUNTER MEDICATION Take 1 capsule by mouth daily. Juice plus Omega/Vegetable  . predniSONE (DELTASONE) 5 MG tablet TAKE 1 TO 2 TABLETS BY MOUTH  DAILY  . predniSONE (DELTASONE) 5 MG tablet TAKE 1 TABLET BY MOUTH ONCE DAILY  . PRESCRIPTION MEDICATION Apply 1 application topically See admin instructions. Apply dime-size amount to face 1-2 TIMES per day as needed   Metronidazole 1% + Ivermectin 1% + Azelaic Acid 15%  . Tocilizumab 162 MG/0.9ML SOAJ AS DIRECTED SUBCUTANEOUS WEEKLY. (Patient taking differently: Inject 162 mg into the muscle every Tuesday. As directed subcutaneous weekly.)  . traMADol (ULTRAM) 50 MG tablet TAKE 1 TO 2 TABLETS BY MOUTH EVERY 8 HOURS AS NEEDED FOR PAIN. (Patient taking differently: Take by mouth every 8 (eight) hours as needed. for pain)  . triamcinolone cream (KENALOG) 0.1 % Apply 1 application topically 2 (two) times daily as needed. (Patient taking differently: Apply 1 application topically 2 (two) times daily as needed (eczema).)  . [DISCONTINUED] cephALEXin (KEFLEX) 500 MG capsule TAKE 1 CAPSULE BY MOUTH FOUR TIMES DAILY FOR 7 DAYS  . [DISCONTINUED] predniSONE (DELTASONE) 5 MG tablet Take 5 mg by mouth every evening.   . [DISCONTINUED] traMADol (ULTRAM) 50 MG tablet Take 100 mg by mouth daily.  . [DISCONTINUED] traMADol (ULTRAM) 50 MG tablet TAKE 1 TABLET BY MOUTH EVERY 6 HOURS AS NEEDED FOR PAIN FOR 30 DAYS.  . [DISCONTINUED] traMADol (ULTRAM) 50 MG tablet TAKE 1 TABLET BY MOUTH EVERY 6 HOURS AS NEEDED FOR PAIN (Patient taking differently: Take by mouth every 6 (six) hours as needed. for pain)  . [DISCONTINUED] traMADol (ULTRAM) 50 MG tablet TAKE 1 TABLET BY MOUTH EVERY 6 HOURS AS NEEDED FOR PAIN. (Patient taking differently: Take by mouth every 6 (six) hours as needed. for pain)  . [DISCONTINUED] traMADol (ULTRAM) 50 MG tablet TAKE 1 TABLET BY MOUTH EVERY 6 HOURS AS NEEDED FOR PAIN (Patient taking differently: Take by mouth every 6 (six) hours as needed. for pain)    Allergies: Patient is allergic to sulfa antibiotics, teriparatide, valproic acid and related, celexa [citalopram], diflucan [fluconazole],  leflunomide, paroxetine hcl, and zoloft [sertraline]. Family History: Patient family history includes Breast cancer in her maternal aunt; Breast cancer (age of onset: 43) in her mother; Diabetes in her daughter; Epilepsy in her father; Multiple sclerosis in her maternal uncle; Nephritis in her father; Psoriasis in her paternal aunt. Social History:  Patient  reports that she quit smoking about 4 years ago. Her smoking use included cigarettes. She has a 5.00 pack-year smoking history. She has never used smokeless tobacco. She reports current alcohol use of about 2.0 standard drinks of alcohol per week. She reports that she does not use drugs.  Review of Systems: Constitutional: Negative for fever malaise or anorexia Cardiovascular: negative for chest pain Respiratory: negative for SOB or persistent cough Gastrointestinal: negative for abdominal pain  Objective  Vitals: BP 120/82   Pulse 99   Temp 98.3 F (36.8 C) (Temporal)   Wt 127 lb 9.6 oz (57.9 kg)   SpO2 98%   BMI 25.77 kg/m  General: no acute distress , A&Ox3, no respiratory distress HEENT:  Oropharynx: No erythema, normal-appearing tonsils, no tonsilloliths or masses noted, midline uvula  cardiovascular:  RRR without murmur or gallop.  Respiratory:  Good breath sounds bilaterally, CTAB with normal respiratory effort    Commons side effects, risks, benefits, and alternatives for medications and treatment plan prescribed today were discussed, and the patient expressed understanding of the given instructions. Patient is instructed to call or message via MyChart if he/she has any questions or concerns regarding our treatment plan. No barriers to understanding were identified. We discussed Red Flag symptoms and signs in detail. Patient expressed understanding regarding what to do in case of urgent or emergency type symptoms.   Medication list was reconciled, printed and provided to the patient in AVS. Patient instructions and summary  information was reviewed with the patient as documented in the AVS. This note was prepared with assistance of Dragon voice recognition software. Occasional wrong-word or sound-a-like substitutions may have occurred due to the inherent limitations of voice recognition software  This visit occurred during the SARS-CoV-2 public health emergency.  Safety protocols were in place, including screening questions prior to the visit, additional usage of staff PPE, and extensive cleaning of exam room while observing appropriate contact time as indicated for disinfecting solutions.

## 2020-04-09 ENCOUNTER — Other Ambulatory Visit (HOSPITAL_COMMUNITY): Payer: Self-pay

## 2020-04-09 MED ORDER — TRAMADOL HCL 50 MG PO TABS
50.0000 mg | ORAL_TABLET | Freq: Three times a day (TID) | ORAL | 1 refills | Status: DC | PRN
  Filled 2020-04-09: qty 120, 40d supply, fill #0
  Filled 2020-05-27: qty 120, 20d supply, fill #1

## 2020-04-10 ENCOUNTER — Other Ambulatory Visit (HOSPITAL_COMMUNITY): Payer: Self-pay

## 2020-04-10 MED FILL — Tocilizumab Subcutaneous Soln Auto-injector 162 MG/0.9ML: SUBCUTANEOUS | 14 days supply | Qty: 1.8 | Fill #0 | Status: AC

## 2020-04-14 ENCOUNTER — Encounter (HOSPITAL_COMMUNITY)
Admission: RE | Admit: 2020-04-14 | Discharge: 2020-04-14 | Disposition: A | Discharge status: Home / Self Care | Payer: 59 | Source: Ambulatory Visit | Attending: Orthopedic Surgery | Admitting: Orthopedic Surgery

## 2020-04-14 ENCOUNTER — Other Ambulatory Visit: Payer: Self-pay

## 2020-04-14 ENCOUNTER — Encounter (HOSPITAL_COMMUNITY): Payer: Self-pay

## 2020-04-14 DIAGNOSIS — Z01812 Encounter for preprocedural laboratory examination: Secondary | ICD-10-CM | POA: Insufficient documentation

## 2020-04-14 LAB — PROTIME-INR
INR: 1 (ref 0.8–1.2)
Prothrombin Time: 12.7 seconds (ref 11.4–15.2)

## 2020-04-14 LAB — CBC
HCT: 35.3 % — ABNORMAL LOW (ref 36.0–46.0)
Hemoglobin: 10.7 g/dL — ABNORMAL LOW (ref 12.0–15.0)
MCH: 28.3 pg (ref 26.0–34.0)
MCHC: 30.3 g/dL (ref 30.0–36.0)
MCV: 93.4 fL (ref 80.0–100.0)
Platelets: 338 10*3/uL (ref 150–400)
RBC: 3.78 MIL/uL — ABNORMAL LOW (ref 3.87–5.11)
RDW: 17.6 % — ABNORMAL HIGH (ref 11.5–15.5)
WBC: 6.5 10*3/uL (ref 4.0–10.5)
nRBC: 0 % (ref 0.0–0.2)

## 2020-04-14 LAB — COMPREHENSIVE METABOLIC PANEL
ALT: 13 U/L (ref 0–44)
AST: 19 U/L (ref 15–41)
Albumin: 4.5 g/dL (ref 3.5–5.0)
Alkaline Phosphatase: 55 U/L (ref 38–126)
Anion gap: 7 (ref 5–15)
BUN: 18 mg/dL (ref 6–20)
CO2: 21 mmol/L — ABNORMAL LOW (ref 22–32)
Calcium: 9 mg/dL (ref 8.9–10.3)
Chloride: 109 mmol/L (ref 98–111)
Creatinine, Ser: 0.72 mg/dL (ref 0.44–1.00)
GFR, Estimated: >60 mL/min (ref >60)
Glucose, Bld: 95 mg/dL (ref 70–99)
Potassium: 4 mmol/L (ref 3.5–5.1)
Sodium: 137 mmol/L (ref 135–145)
Total Bilirubin: 0.4 mg/dL (ref 0.3–1.2)
Total Protein: 6.8 g/dL (ref 6.5–8.1)

## 2020-04-14 LAB — SURGICAL PCR SCREEN
MRSA, PCR: NEGATIVE
Staphylococcus aureus: NEGATIVE

## 2020-04-14 LAB — APTT: aPTT: 28 seconds (ref 24–36)

## 2020-04-14 NOTE — Progress Notes (Addendum)
Anesthesia Review:  PCP: DR Rosendo Gros andy- LOV 04/07/20  Called and requested clearance if available. Pt reports that DR Aluiso office sent a request for clearance to Dr Sandi Carne  Clearance received on 04/17/20 from [facility] [place].  To be placed on chart  Cardiologist : Chest x-ray : EKG : Echo : Stress test: Cardiac Cath :  Activity level: can do a flight of stairs without difficulty  Sleep Study/ CPAP : no  Fasting Blood Sugar :      / Checks Blood Sugar -- times a day:   Blood Thinner/ Instructions /Last Dose: ASA / Instructions/ Last Dose :  CBC done 4.11.22 routed to DR Aluisio.

## 2020-04-15 ENCOUNTER — Other Ambulatory Visit (HOSPITAL_COMMUNITY): Payer: Self-pay

## 2020-04-15 MED FILL — Cyclobenzaprine HCl Tab 10 MG: ORAL | 20 days supply | Qty: 60 | Fill #0 | Status: AC

## 2020-04-17 ENCOUNTER — Other Ambulatory Visit (HOSPITAL_COMMUNITY)
Admission: RE | Admit: 2020-04-17 | Discharge: 2020-04-17 | Disposition: A | Discharge status: Home / Self Care | Payer: 59 | Source: Ambulatory Visit | Attending: Orthopedic Surgery | Admitting: Orthopedic Surgery

## 2020-04-17 DIAGNOSIS — Z20822 Contact with and (suspected) exposure to covid-19: Secondary | ICD-10-CM | POA: Insufficient documentation

## 2020-04-17 DIAGNOSIS — Z01812 Encounter for preprocedural laboratory examination: Secondary | ICD-10-CM | POA: Diagnosis not present

## 2020-04-17 LAB — SARS CORONAVIRUS 2 (TAT 6-24 HRS): SARS Coronavirus 2: NEGATIVE

## 2020-04-18 NOTE — H&P (Signed)
TOTAL KNEE ADMISSION H&P  Patient is being admitted for left total knee arthroplasty.  Subjective:  Chief Complaint: Left knee pain.  HPI: Anne Garrett, 45 y.o. female has a history of pain and functional disability in the left knee due to arthritis and has failed non-surgical conservative treatments for greater than 12 weeks to include NSAID's and/or analgesics, corticosteriod injections and activity modification. Onset of symptoms was gradual, starting several years ago with gradually worsening course since that time. The patient noted no past surgery on the left knee.  Patient currently rates pain in the left knee at 6 out of 10 with activity. Patient has worsening of pain with activity and weight bearing and pain that interferes with activities of daily living. Patient has evidence of periarticular osteophytes and joint space narrowing by imaging studies. There is no active infection.  Patient Active Problem List   Diagnosis Date Noted  . Status post evacuation of hematoma 11/13/2019  . Juvenile rheumatoid arthritis ([place]) 05/13/2019  . Family history of breast cancer 05/13/2019  . GERD (gastroesophageal reflux disease) 05/13/2019  . Migraines 05/13/2019  . Immunosuppression due to chronic steroid use ([place]) 05/13/2019  . Rosacea 05/13/2019  . 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 Lumbar pseudoarthrosis 06/04/2016  . Lumbar stenosis 04/08/2016  . S/P cervical spinal fusion 07/13/2015  . Cervical stenosis of spinal canal 07/12/2015  . HNP (herniated nucleus pulposus), cervical 06/19/2015  . VAIN I (vaginal intraepithelial neoplasia grade I) 08/24/2013  . H/O Clostridium difficile infection 01/16/2013  . Hemangioma 01/16/2013  . Secondary adrenal insufficiency ([place]) 01/16/2013  . Osteoporosis 12/28/2012    Past Medical History:  Diagnosis Date  . Adrenal insufficiency ([place])    secondary to Prednisone  . Eczema    coner oif eye lid  . Headache     migraines  . Intervertebral disk disease   . Juvenile idiopathic arthritis ([place])   . LGSIL (low grade squamous intraepithelial dysplasia) 08/20/2013   Formatting of this note might be different from the original. On pap smear June 2015.   HR HPV positive.  Colposcopy and cervical AND vaginal biopsies done Aug 2015  . Osteoporosis   . Rheumatoid arthritis ([place])    Juvenile onset    Past Surgical History:  Procedure Laterality Date  . ANKLE ARTHROPLASTY Right   . ANTERIOR CERVICAL DECOMP/DISCECTOMY FUSION N/A 06/19/2015   Procedure: C3-4 C4-5 Anterior cervical decompression/diskectomy/fusion;  Surgeon: Jovita Gamma, MD;  Location: [facility];  Service: Neurosurgery;  Laterality: N/A;  C3-4 C4-5 Anterior cervical decompression/diskectomy/fusion  . APPENDECTOMY    . CARPAL TUNNEL RELEASE Right   . CESAREAN SECTION    . CHOLECYSTECTOMY    . CHOLECYSTECTOMY  2006  . COLONOSCOPY    . ESSURE TUBAL LIGATION     Essure Implant-Permanent birth control  . excision to palm Left    excision of mass to right plam  . FOOT ARTHRODESIS Left 03/01/2019   Procedure: Left talonavicular and naviculocuneiform arthrodesis;  Surgeon: [name], MD;  Location: [facility];  Service: Orthopedics;  Laterality: Left;  . HARDWARE REMOVAL Right 02/02/2017   Procedure: RIGHT WRIST DEEP IMPLANT REMOVAL, RIGHT WRIST AND HAND EXTENSOR TENDON RECONSTRUCTION, TENDON TRANSFER AND RECONSTRUCTION AS INDICATED;  Surgeon: Iran Planas, MD;  Location: [facility];  Service: Orthopedics;  Laterality: Right;  . IRRIGATION AND DEBRIDEMENT SHOULDER Right 11/13/2019   Procedure: IRRIGATION AND DEBRIDEMENT RIGHT SHOULDER WITH WOUND CLOSURE;  Surgeon: [name], MD;  Location: [facility];  Service: Orthopedics;  Laterality: Right;  71min  . JOINT REPLACEMENT Bilateral    bilat hip replacements  . ORIF FEMUR FRACTURE Right   . OTHER SURGICAL HISTORY  06/04/2016   Revision of PLIF  . plif l5-s1 arthrodeisis    2018  .  POSTERIOR CERVICAL FUSION/FORAMINOTOMY N/A 07/12/2015   Procedure: C2 to C5 Cervical laminectomy, C2 to C5 posterior cervical arthrodesis with instrumentation and bone graft;  Surgeon: Jovita Gamma, MD;  Location: [facility];  Service: Neurosurgery;  Laterality: N/A;  C2 to C5 Cervical laminectomy, C2  to C5 posterior cervical arthrodesis with instrumentation and bone graft  . REPAIR EXTENSOR TENDON Right 02/02/2017   Procedure: REPAIR EXTENSOR TENDON;  Surgeon: Iran Planas, MD;  Location: [facility];  Service: Orthopedics;  Laterality: Right;  . REVERSE SHOULDER ARTHROPLASTY Left 09/07/2018   Procedure: REVERSE SHOULDER ARTHROPLASTY;  Surgeon: [name], MD;  Location: [facility];  Service: Orthopedics;  Laterality: Left;  . REVERSE SHOULDER ARTHROPLASTY Right 09/20/2019   Procedure: REVERSE SHOULDER ARTHROPLASTY;  Surgeon: [name], MD;  Location: [facility];  Service: Orthopedics;  Laterality: Right;  121min  . REVISION TOTAL SHOULDER TO REVERSE TOTAL SHOULDER Right 11/01/2019   Procedure: revision right total shoulder to reverse total shoulder;  Surgeon: [name], MD;  Location: [facility];  Service: Orthopedics;  Laterality: Right;  exparel block  . right foot surgery Right    right jfoot arthrodesis and right ankle arthroplasty  . WRIST SURGERY Bilateral    arthrodesis    Prior to Admission medications   Medication Sig Start Date End Date Taking? Authorizing Provider  Biotin w/ Vitamins C & E (HAIR/SKIN/NAILS PO) Take 1 tablet by mouth daily.   Yes [provider]  Calcium Carb-Cholecalciferol (CALCIUM+D3 PO) Take 1 tablet by mouth daily.   Yes [provider]  Cholecalciferol (VITAMIN D3) 50 MCG (2000 UT) TABS Take 2,000 Units by mouth daily.   Yes [provider]  OVER THE COUNTER MEDICATION Take 1 capsule by mouth daily. Juice plus Omega/Vegetable   Yes [provider]  PRESCRIPTION MEDICATION Apply 1 application topically See admin instructions. Apply  dime-size amount to face 1-2 TIMES per day as needed   Metronidazole 1% + Ivermectin 1% + Azelaic Acid 15%   Yes [provider]  triamcinolone cream (KENALOG) 0.1 % Apply 1 application topically 2 (two) times daily as needed. Patient taking differently: Apply 1 application topically 2 (two) times daily as needed (eczema). 05/11/19  Yes [name], MD  Butalbital-APAP-Caffeine [phone] MG capsule TAKE 1 CAPSULE BY MOUTH DAILY AS NEEDED. Patient taking differently: Take 1 capsule by mouth daily as needed for headache. 03/25/20 03/25/21  [name], MD  cyclobenzaprine (FLEXERIL) 10 MG tablet TAKE 1 TABLET (10 MG TOTAL) BY MOUTH 3 (THREE) TIMES DAILY AS NEEDED. 02/15/20 02/14/21  [name], MD  cyclobenzaprine (FLEXERIL) 10 MG tablet TAKE 1 TABLET BY MOUTH TWICE DAILY AS NEEDED FOR SPASM 10/15/19 10/14/20  [name], MD  nystatin (MYCOSTATIN) 100000 UNIT/ML suspension SWISH AND SPIT 5MLS BY MOUTH 4 TIMES DAILY AS DIRECTED Patient taking differently: Take 5 mLs by mouth 4 (four) times daily as needed (yeast infection). 02/15/20 02/14/21  [name], MD  predniSONE (DELTASONE) 5 MG tablet TAKE 1 TO 2 TABLETS BY MOUTH DAILY 11/20/19 11/19/20  Bjorn Pippin, PA-C  predniSONE (DELTASONE) 5 MG tablet TAKE 1 TABLET BY MOUTH ONCE DAILY 04/30/19 04/29/20  [name], MD  Tocilizumab 162 MG/0.9ML SOAJ AS  DIRECTED SUBCUTANEOUS WEEKLY. Patient taking differently: Inject 162 mg into the muscle every Tuesday. As directed subcutaneous weekly. 11/05/19 11/04/20  Tresa Garter, MD  traMADol (ULTRAM) 50 MG tablet TAKE 1 TO 2 TABLETS BY MOUTH EVERY 8 HOURS AS NEEDED FOR PAIN. Patient taking differently: Take by mouth every 8 (eight) hours as needed. for pain 02/29/20 08/27/20  Bjorn Pippin, PA-C  traMADol (ULTRAM) 50 MG tablet Take 1 tablet (50 mg total) by mouth 3 (three) times daily as needed. 04/09/20       Allergies  Allergen Reactions  . Sulfa Antibiotics Itching and Rash     Head to toe  . Teriparatide Other (See Comments)    Severe Muscle Pain, [place]  . Valproic Acid And Related Rash    Head to toe  . Celexa [Citalopram] Diarrhea       . Diflucan [Fluconazole] Rash    Fixed drug eruption  . Leflunomide Diarrhea  . Paroxetine Hcl Diarrhea  . Zoloft [Sertraline] Diarrhea    Social History   Socioeconomic History  . Marital status: Single    Spouse name: Not on file  . Number of children: 2  . Years of education: 42  . Highest education level: Not on file  Occupational History  . Occupation: Compliance Furniture conservator/restorer: [employer]  Tobacco Use  . Smoking status: Former Smoker    Packs/day: 0.25    Years: 20.00    Pack years: 5.00    Types: Cigarettes    Quit date: 07/02/2015    Years since quitting: 4.8  . Smokeless tobacco: Never Used  Vaping Use  . Vaping Use: Never used  Substance and Sexual Activity  . Alcohol use: Yes    Alcohol/week: 2.0 standard drinks    Types: 2 Glasses of wine per week    Comment: social  . Drug use: No  . Sexual activity: Yes    Birth control/protection: Other-see comments    Comment: essure coils  Other Topics Concern  . Not on file  Social History Narrative   Lives at home w/ 2 cats and child every other weekend   Significant other - Gerald Stabs   Right-hand   Drinks about 2 cups of coffee or soda per day   Social Determinants of Radio broadcast assistant Strain: Not on file  Food Insecurity: Not on file  Transportation Needs: Not on file  Physical Activity: Not on file  Stress: Not on file  Social Connections: Not on file  Intimate Partner Violence: Not on file    Tobacco Use: Medium Risk  . Smoking Tobacco Use: Former Smoker  . Smokeless Tobacco Use: Never Used   Social History   Substance and Sexual Activity  Alcohol Use Yes  . Alcohol/week: 2.0 standard drinks  . Types: 2 Glasses of wine per week   Comment: social    Family History  Problem Relation Age of Onset  .  Breast cancer Mother 61  . Nephritis Father   . Epilepsy Father   . Breast cancer Maternal Aunt        2 mat aunts   . Multiple sclerosis Maternal Uncle   . Psoriasis Paternal Aunt   . Diabetes Daughter     ROS:  Constitutional Constitutional: no fever, no chills, no night sweats, no significant weight loss  Cardiovascular Cardiovascular: no chest pain, no palpitations  Respiratory Respiratory: no cough, no shortness of breath, No COPD  Gastrointestinal Gastrointestinal: no vomiting, no nausea  Musculoskeletal Musculoskeletal: no swelling in Joints, Joint Pain  Neurologic Neurologic: no numbness, no tingling, no difficulty with balance  Objective:  Physical Exam: Well nourished and well developed.  General: Alert and oriented x3, cooperative and pleasant, no acute distress.  Head: normocephalic, atraumatic, neck supple.  Eyes: EOMI.  Respiratory: breath sounds clear in all fields, no wheezing, rales, or rhonchi. Cardiovascular: Regular rate and rhythm, no murmurs, gallops or rubs.  Abdomen: non-tender to palpation and soft, normoactive bowel sounds. Musculoskeletal:  The patient has a significantly antalgic gait pattern, slightly worse on the left than the right.    Bilateral Hip Exam:  The range of motion: normal without discomfort.  There is no tenderness over the greater trochanter bursa.    Right Knee Exam:  No effusion present. No swelling present.  The range of motion is: 0 to 125 degrees.  Positive crepitus on range of motion of the knee.  No medial joint line tenderness.  No lateral joint line tenderness.  The knee is stable.    Left Knee Exam:  Minimal effusion present. Valgus deformity.  The range of motion is: 0 to 125 degrees.  Moderate to marked crepitus on range of motion of the knee.  No medial tenderness.  Positive lateral joint line tenderness.  The knee is stable.       Radiographs- AP and lateral of the bilateral knees dated  10/11/2019 demonstrate severe tricompartmental arthritis of the bilateral knees, left greater than right. Slight valgus on the left and neutral alignment on the right. There is a plate present in the right femur, which runs from proximal third of the femur to the knee with multiple screws.   Vital signs in last 24 hours:    Imaging Review AP and lateral of the bilateral knees dated 10/11/2019 demonstrate severe tricompartmental arthritis of the bilateral knees, left greater than right. Slight valgus on the left and neutral alignment on the right. There is a plate present in the right femur, which runs from proximal third of the femur to the knee with multiple screws.   Assessment/Plan:  End stage arthritis, left knee   The patient history, physical examination, clinical judgment of the provider and imaging studies are consistent with end stage degenerative joint disease of the left knee and total knee arthroplasty is deemed medically necessary. The treatment options including medical management, injection therapy arthroscopy and arthroplasty were discussed at length. The risks and benefits of total knee arthroplasty were presented and reviewed. The risks due to aseptic loosening, infection, stiffness, patella tracking problems, thromboembolic complications and other imponderables were discussed. The patient acknowledged the explanation, agreed to proceed with the plan and consent was signed. Patient is being admitted for inpatient treatment for surgery, pain control, PT, OT, prophylactic antibiotics, VTE prophylaxis, progressive ambulation and ADLs and discharge planning. The patient is planning to be discharged home.   Patient's anticipated LOS is less than 2 midnights, meeting these requirements: - Younger than 6 - Lives within 1 hour of care - Has a competent adult at home to recover with post-op recover - NO history of  - Chronic pain requiring opiods  - Diabetes  - Coronary Artery  Disease  - Heart failure  - Heart attack  - Stroke  - DVT/VTE  - Cardiac arrhythmia  - Respiratory Failure/COPD  - Renal failure  - Anemia  - Advanced Liver disease   Therapy Plans: [facility] Disposition: Rometta Emery (boyfriend) Planned DVT Prophylaxis: Aspirin 325mg  DME Needed: None PCP: Clearance Received  TXA: IV Allergies: Sulfa Anesthesia Concerns: None BMI: 25.1 Last HgbA1c: N/A  Pharmacy: [facility]  Other: Takes Tramadol 50mg  2x daily currently for back pain.   - Patient was instructed on what medications to stop prior to surgery. - Follow-up visit in 2 weeks with Dr. Wynelle Link - Begin physical therapy following surgery - Pre-operative lab work as pre-surgical testing - Prescriptions will be provided in hospital at time of discharge  Fenton Foy, [facility], PA-C Orthopedic Surgery [facility] [place]

## 2020-04-20 ENCOUNTER — Other Ambulatory Visit: Payer: Self-pay | Admitting: Physician Assistant

## 2020-04-20 MED ORDER — BUPIVACAINE LIPOSOME 1.3 % IJ SUSP
20.0000 mL | INTRAMUSCULAR | Status: DC
  Filled 2020-04-20: qty 20

## 2020-04-20 NOTE — Anesthesia Preprocedure Evaluation (Addendum)
Anesthesia Evaluation  Patient identified by MRN, date of birth, ID band Patient awake    Reviewed: Allergy & Precautions, NPO status , Patient's Chart, lab work & pertinent test results  History of Anesthesia Complications Negative for: history of anesthetic complications  Airway Mallampati: IV   Neck ROM: Full  Mouth opening: Limited Mouth Opening  Dental  (+) Dental Advisory Given, Implants, Caps   Pulmonary former smoker,  04/17/2020 SARS coronavirus NEG   breath sounds clear to auscultation       Cardiovascular negative cardio ROS   Rhythm:Regular Rate:Normal     Neuro/Psych  Headaches,    GI/Hepatic negative GI ROS, Neg liver ROS,   Endo/Other  negative endocrine ROS  Renal/GU      Musculoskeletal  (+) Arthritis , Rheumatoid disorders and steroids,    Abdominal   Peds  Hematology  (+) Blood dyscrasia (Hb 10.7), anemia ,   Anesthesia Other Findings   Reproductive/Obstetrics                            Anesthesia Physical Anesthesia Plan  ASA: III  Anesthesia Plan: Spinal   Post-op Pain Management:  Regional for Post-op pain   Induction:   PONV Risk Score and Plan: 2 and Ondansetron and Dexamethasone  Airway Management Planned: Natural Airway and Simple Face Mask  Additional Equipment: None  Intra-op Plan:   Post-operative Plan:   Informed Consent: I have reviewed the patients History and Physical, chart, labs and discussed the procedure including the risks, benefits and alternatives for the proposed anesthesia with the patient or authorized representative who has indicated his/her understanding and acceptance.     Dental advisory given  Plan Discussed with: CRNA and Surgeon  Anesthesia Plan Comments:        Anesthesia Quick Evaluation

## 2020-04-21 ENCOUNTER — Ambulatory Visit (HOSPITAL_COMMUNITY)
Admission: RE | Admit: 2020-04-21 | Discharge: 2020-04-21 | Disposition: A | Discharge status: Home / Self Care | Payer: 59 | Attending: Orthopedic Surgery | Admitting: Orthopedic Surgery

## 2020-04-21 ENCOUNTER — Encounter (HOSPITAL_COMMUNITY): Payer: Self-pay | Admitting: Orthopedic Surgery

## 2020-04-21 ENCOUNTER — Ambulatory Visit (HOSPITAL_COMMUNITY): Payer: 59 | Admitting: Physician Assistant

## 2020-04-21 ENCOUNTER — Encounter (HOSPITAL_COMMUNITY)
Admission: RE | Disposition: A | Discharge status: Home / Self Care | Payer: Self-pay | Source: Home / Self Care | Attending: Orthopedic Surgery

## 2020-04-21 ENCOUNTER — Other Ambulatory Visit (HOSPITAL_COMMUNITY): Payer: Self-pay

## 2020-04-21 ENCOUNTER — Ambulatory Visit (HOSPITAL_COMMUNITY): Payer: 59 | Admitting: Certified Registered Nurse Anesthetist

## 2020-04-21 DIAGNOSIS — M25762 Osteophyte, left knee: Secondary | ICD-10-CM | POA: Insufficient documentation

## 2020-04-21 DIAGNOSIS — Z87891 Personal history of nicotine dependence: Secondary | ICD-10-CM | POA: Insufficient documentation

## 2020-04-21 DIAGNOSIS — M1712 Unilateral primary osteoarthritis, left knee: Secondary | ICD-10-CM | POA: Diagnosis not present

## 2020-04-21 DIAGNOSIS — M069 Rheumatoid arthritis, unspecified: Secondary | ICD-10-CM | POA: Insufficient documentation

## 2020-04-21 DIAGNOSIS — Z882 Allergy status to sulfonamides status: Secondary | ICD-10-CM | POA: Diagnosis not present

## 2020-04-21 DIAGNOSIS — M1710 Unilateral primary osteoarthritis, unspecified knee: Secondary | ICD-10-CM

## 2020-04-21 DIAGNOSIS — Z96643 Presence of artificial hip joint, bilateral: Secondary | ICD-10-CM | POA: Diagnosis not present

## 2020-04-21 DIAGNOSIS — M179 Osteoarthritis of knee, unspecified: Secondary | ICD-10-CM | POA: Diagnosis present

## 2020-04-21 DIAGNOSIS — Z96612 Presence of left artificial shoulder joint: Secondary | ICD-10-CM | POA: Insufficient documentation

## 2020-04-21 DIAGNOSIS — Z888 Allergy status to other drugs, medicaments and biological substances status: Secondary | ICD-10-CM | POA: Insufficient documentation

## 2020-04-21 DIAGNOSIS — G43909 Migraine, unspecified, not intractable, without status migrainosus: Secondary | ICD-10-CM | POA: Diagnosis not present

## 2020-04-21 DIAGNOSIS — Z79899 Other long term (current) drug therapy: Secondary | ICD-10-CM | POA: Insufficient documentation

## 2020-04-21 DIAGNOSIS — M659 Synovitis and tenosynovitis, unspecified: Secondary | ICD-10-CM | POA: Diagnosis not present

## 2020-04-21 DIAGNOSIS — Z981 Arthrodesis status: Secondary | ICD-10-CM | POA: Insufficient documentation

## 2020-04-21 DIAGNOSIS — M06862 Other specified rheumatoid arthritis, left knee: Secondary | ICD-10-CM | POA: Diagnosis not present

## 2020-04-21 DIAGNOSIS — G8918 Other acute postprocedural pain: Secondary | ICD-10-CM | POA: Diagnosis not present

## 2020-04-21 DIAGNOSIS — Z96611 Presence of right artificial shoulder joint: Secondary | ICD-10-CM | POA: Insufficient documentation

## 2020-04-21 DIAGNOSIS — K219 Gastro-esophageal reflux disease without esophagitis: Secondary | ICD-10-CM | POA: Diagnosis not present

## 2020-04-21 HISTORY — PX: TOTAL KNEE ARTHROPLASTY: SHX125

## 2020-04-21 LAB — TYPE AND SCREEN
ABO/RH(D): A POS
Antibody Screen: NEGATIVE

## 2020-04-21 LAB — PREGNANCY, URINE: Preg Test, Ur: NEGATIVE

## 2020-04-21 SURGERY — ARTHROPLASTY, KNEE, TOTAL
Anesthesia: Spinal | Site: Knee | Laterality: Left

## 2020-04-21 MED ORDER — LACTATED RINGERS IV BOLUS
250.0000 mL | Freq: Once | INTRAVENOUS | Status: AC
  Administered 2020-04-21: 250 mL via INTRAVENOUS

## 2020-04-21 MED ORDER — POVIDONE-IODINE 10 % EX SWAB
2.0000 "application " | Freq: Once | CUTANEOUS | Status: AC
  Administered 2020-04-21: 2 via TOPICAL

## 2020-04-21 MED ORDER — METHOCARBAMOL 500 MG IVPB - SIMPLE MED
INTRAVENOUS | Status: AC
  Filled 2020-04-21: qty 50

## 2020-04-21 MED ORDER — PROPOFOL 1000 MG/100ML IV EMUL
INTRAVENOUS | Status: AC
  Filled 2020-04-21: qty 100

## 2020-04-21 MED ORDER — HYDROMORPHONE HCL 1 MG/ML IJ SOLN
0.2500 mg | INTRAMUSCULAR | Status: DC | PRN
  Administered 2020-04-21 (×2): 0.25 mg via INTRAVENOUS
  Administered 2020-04-21: 0.5 mg via INTRAVENOUS

## 2020-04-21 MED ORDER — FENTANYL CITRATE (PF) 100 MCG/2ML IJ SOLN
INTRAMUSCULAR | Status: AC
  Filled 2020-04-21: qty 2

## 2020-04-21 MED ORDER — ONDANSETRON HCL 4 MG/2ML IJ SOLN: 4.0000 mg | Freq: Four times a day (QID) | INTRAMUSCULAR | Status: DC | PRN

## 2020-04-21 MED ORDER — SODIUM CHLORIDE 0.9 % IR SOLN
Status: DC | PRN
  Administered 2020-04-21: 1000 mL

## 2020-04-21 MED ORDER — METHOCARBAMOL 500 MG PO TABS
500.0000 mg | ORAL_TABLET | Freq: Four times a day (QID) | ORAL | 0 refills | Status: DC | PRN
  Filled 2020-04-21: qty 40, 10d supply, fill #0

## 2020-04-21 MED ORDER — CEFAZOLIN SODIUM-DEXTROSE 2-4 GM/100ML-% IV SOLN: 2.0000 g | Freq: Four times a day (QID) | INTRAVENOUS | Status: DC

## 2020-04-21 MED ORDER — METHOCARBAMOL 500 MG IVPB - SIMPLE MED
500.0000 mg | Freq: Four times a day (QID) | INTRAVENOUS | Status: DC | PRN
  Administered 2020-04-21: 500 mg via INTRAVENOUS

## 2020-04-21 MED ORDER — ROPIVACAINE HCL 7.5 MG/ML IJ SOLN
INTRAMUSCULAR | Status: DC | PRN
  Administered 2020-04-21: 20 mL via PERINEURAL

## 2020-04-21 MED ORDER — ONDANSETRON HCL 4 MG/2ML IJ SOLN
INTRAMUSCULAR | Status: DC | PRN
  Administered 2020-04-21: 4 mg via INTRAVENOUS

## 2020-04-21 MED ORDER — CEFAZOLIN SODIUM-DEXTROSE 2-4 GM/100ML-% IV SOLN
INTRAVENOUS | Status: AC
  Administered 2020-04-21: 2 g via INTRAVENOUS
  Filled 2020-04-21: qty 100

## 2020-04-21 MED ORDER — TRANEXAMIC ACID-NACL 1000-0.7 MG/100ML-% IV SOLN
1000.0000 mg | INTRAVENOUS | Status: AC
  Administered 2020-04-21: 1000 mg via INTRAVENOUS
  Filled 2020-04-21: qty 100

## 2020-04-21 MED ORDER — LACTATED RINGERS IV BOLUS
500.0000 mL | Freq: Once | INTRAVENOUS | Status: AC
  Administered 2020-04-21: 500 mL via INTRAVENOUS

## 2020-04-21 MED ORDER — HYDROMORPHONE HCL 1 MG/ML IJ SOLN
INTRAMUSCULAR | Status: AC
  Administered 2020-04-21: 0.5 mg via INTRAVENOUS
  Filled 2020-04-21: qty 1

## 2020-04-21 MED ORDER — OXYCODONE HCL 5 MG PO TABS: 5.0000 mg | ORAL_TABLET | Freq: Once | ORAL | Status: AC | PRN

## 2020-04-21 MED ORDER — PROMETHAZINE HCL 25 MG/ML IJ SOLN: 6.2500 mg | INTRAMUSCULAR | Status: DC | PRN

## 2020-04-21 MED ORDER — MIDAZOLAM HCL 2 MG/2ML IJ SOLN
0.5000 mg | Freq: Once | INTRAMUSCULAR | Status: DC | PRN
Start: 2020-04-21 — End: 2020-04-21

## 2020-04-21 MED ORDER — MIDAZOLAM HCL 5 MG/5ML IJ SOLN
INTRAMUSCULAR | Status: DC | PRN
  Administered 2020-04-21: 2 mg via INTRAVENOUS

## 2020-04-21 MED ORDER — SODIUM CHLORIDE (PF) 0.9 % IJ SOLN
INTRAMUSCULAR | Status: DC | PRN
  Administered 2020-04-21: 60 mL

## 2020-04-21 MED ORDER — ASPIRIN EC 325 MG PO TBEC
325.0000 mg | DELAYED_RELEASE_TABLET | Freq: Two times a day (BID) | ORAL | 0 refills | Status: AC
  Filled 2020-04-21: qty 42, 21d supply, fill #0

## 2020-04-21 MED ORDER — OXYCODONE HCL 5 MG PO TABS
5.0000 mg | ORAL_TABLET | Freq: Four times a day (QID) | ORAL | 0 refills | Status: DC | PRN
  Filled 2020-04-21: qty 42, 6d supply, fill #0

## 2020-04-21 MED ORDER — PREDNISONE 10 MG PO TABS: 10.0000 mg | ORAL_TABLET | Freq: Two times a day (BID) | ORAL | Status: DC

## 2020-04-21 MED ORDER — OXYCODONE HCL 5 MG PO TABS
ORAL_TABLET | ORAL | Status: AC
  Administered 2020-04-21: 5 mg via ORAL
  Filled 2020-04-21: qty 1

## 2020-04-21 MED ORDER — ACETAMINOPHEN 10 MG/ML IV SOLN
1000.0000 mg | Freq: Four times a day (QID) | INTRAVENOUS | Status: DC
  Administered 2020-04-21: 1000 mg via INTRAVENOUS
  Filled 2020-04-21: qty 100

## 2020-04-21 MED ORDER — SODIUM CHLORIDE (PF) 0.9 % IJ SOLN
INTRAMUSCULAR | Status: AC
  Filled 2020-04-21: qty 10

## 2020-04-21 MED ORDER — MEPERIDINE HCL 50 MG/ML IJ SOLN: 6.2500 mg | INTRAMUSCULAR | Status: DC | PRN

## 2020-04-21 MED ORDER — MEPIVACAINE HCL (PF) 2 % IJ SOLN
INTRAMUSCULAR | Status: DC | PRN
  Administered 2020-04-21: 50 mg via INTRATHECAL

## 2020-04-21 MED ORDER — BUPIVACAINE LIPOSOME 1.3 % IJ SUSP
INTRAMUSCULAR | Status: DC | PRN
  Administered 2020-04-21: 20 mL

## 2020-04-21 MED ORDER — METHOCARBAMOL 500 MG PO TABS: 500.0000 mg | ORAL_TABLET | Freq: Four times a day (QID) | ORAL | Status: DC | PRN

## 2020-04-21 MED ORDER — HYDROMORPHONE HCL 1 MG/ML IJ SOLN
0.2500 mg | INTRAMUSCULAR | Status: DC | PRN
  Administered 2020-04-21: 0.5 mg via INTRAVENOUS

## 2020-04-21 MED ORDER — CEFAZOLIN SODIUM-DEXTROSE 2-4 GM/100ML-% IV SOLN
2.0000 g | INTRAVENOUS | Status: AC
  Administered 2020-04-21: 2 g via INTRAVENOUS
  Filled 2020-04-21: qty 100

## 2020-04-21 MED ORDER — PROPOFOL 10 MG/ML IV BOLUS
INTRAVENOUS | Status: AC
  Filled 2020-04-21: qty 40

## 2020-04-21 MED ORDER — FENTANYL CITRATE (PF) 100 MCG/2ML IJ SOLN
INTRAMUSCULAR | Status: DC | PRN
  Administered 2020-04-21 (×2): 50 ug via INTRAVENOUS

## 2020-04-21 MED ORDER — PHENYLEPHRINE 40 MCG/ML (10ML) SYRINGE FOR IV PUSH (FOR BLOOD PRESSURE SUPPORT)
PREFILLED_SYRINGE | INTRAVENOUS | Status: DC | PRN
  Administered 2020-04-21: 80 ug via INTRAVENOUS

## 2020-04-21 MED ORDER — ONDANSETRON HCL 4 MG/2ML IJ SOLN
INTRAMUSCULAR | Status: AC
  Filled 2020-04-21: qty 2

## 2020-04-21 MED ORDER — PROMETHAZINE HCL 25 MG/ML IJ SOLN
INTRAMUSCULAR | Status: AC
  Administered 2020-04-21: 6.25 mg via INTRAVENOUS
  Filled 2020-04-21: qty 1

## 2020-04-21 MED ORDER — GABAPENTIN 300 MG PO CAPS
ORAL_CAPSULE | ORAL | 0 refills | Status: DC
  Filled 2020-04-21: qty 84, 42d supply, fill #0

## 2020-04-21 MED ORDER — PROPOFOL 10 MG/ML IV BOLUS
INTRAVENOUS | Status: DC | PRN
  Administered 2020-04-21: 20 mg via INTRAVENOUS

## 2020-04-21 MED ORDER — OXYCODONE HCL 5 MG/5ML PO SOLN: 5.0000 mg | Freq: Once | ORAL | Status: AC | PRN

## 2020-04-21 MED ORDER — DEXAMETHASONE SODIUM PHOSPHATE 10 MG/ML IJ SOLN
INTRAMUSCULAR | Status: AC
  Filled 2020-04-21: qty 1

## 2020-04-21 MED ORDER — LACTATED RINGERS IV SOLN: INTRAVENOUS | Status: DC

## 2020-04-21 MED ORDER — DEXAMETHASONE SODIUM PHOSPHATE 10 MG/ML IJ SOLN
8.0000 mg | Freq: Once | INTRAMUSCULAR | Status: AC
  Administered 2020-04-21: 8 mg via INTRAVENOUS

## 2020-04-21 MED ORDER — ONDANSETRON HCL 4 MG PO TABS
4.0000 mg | ORAL_TABLET | Freq: Four times a day (QID) | ORAL | Status: DC | PRN
  Filled 2020-04-21: qty 1

## 2020-04-21 MED ORDER — MIDAZOLAM HCL 2 MG/2ML IJ SOLN
INTRAMUSCULAR | Status: AC
  Filled 2020-04-21: qty 2

## 2020-04-21 MED ORDER — STERILE WATER FOR IRRIGATION IR SOLN
Status: DC | PRN
  Administered 2020-04-21: 2000 mL

## 2020-04-21 MED ORDER — PROPOFOL 500 MG/50ML IV EMUL
INTRAVENOUS | Status: DC | PRN
  Administered 2020-04-21: 110 ug/kg/min via INTRAVENOUS

## 2020-04-21 SURGICAL SUPPLY — 49 items
ATTUNE MED DOME PAT 32 KNEE (Knees) ×2 IMPLANT
ATTUNE PS FEM LT SZ 4 CEM KNEE (Femur) ×2 IMPLANT
ATTUNE PSRP INSR SZ4 7 KNEE (Insert) ×2 IMPLANT
BAG ZIPLOCK 12X15 (MISCELLANEOUS) IMPLANT
BASE TIBIAL ROT PLAT SZ 3 KNEE (Knees) ×1 IMPLANT
BLADE SAG 18X100X1.27 (BLADE) ×2 IMPLANT
BLADE SAW SGTL 11.0X1.19X90.0M (BLADE) ×2 IMPLANT
BNDG ELASTIC 6X5.8 VLCR STR LF (GAUZE/BANDAGES/DRESSINGS) ×2 IMPLANT
BOWL SMART MIX CTS (DISPOSABLE) ×2 IMPLANT
CEMENT HV SMART SET (Cement) ×4 IMPLANT
COVER SURGICAL LIGHT HANDLE (MISCELLANEOUS) ×2 IMPLANT
COVER WAND RF STERILE (DRAPES) IMPLANT
CUFF TOURN SGL QUICK 34 (TOURNIQUET CUFF) ×1
CUFF TRNQT CYL 34X4.125X (TOURNIQUET CUFF) ×1 IMPLANT
DECANTER SPIKE VIAL GLASS SM (MISCELLANEOUS) ×2 IMPLANT
DRAPE U-SHAPE 47X51 STRL (DRAPES) ×2 IMPLANT
DRSG AQUACEL AG ADV 3.5X10 (GAUZE/BANDAGES/DRESSINGS) ×2 IMPLANT
DURAPREP 26ML APPLICATOR (WOUND CARE) ×2 IMPLANT
ELECT REM PT RETURN 15FT ADLT (MISCELLANEOUS) ×2 IMPLANT
GLOVE SRG 8 PF TXTR STRL LF DI (GLOVE) ×1 IMPLANT
GLOVE SURG ENC MOIS LTX SZ8 (GLOVE) ×4 IMPLANT
GLOVE SURG UNDER POLY LF SZ8 (GLOVE) ×1
GLOVE SURG UNDER POLY LF SZ8.5 (GLOVE) IMPLANT
GOWN STRL REUS W/TWL LRG LVL3 (GOWN DISPOSABLE) ×4 IMPLANT
GOWN STRL REUS W/TWL XL LVL3 (GOWN DISPOSABLE) ×2 IMPLANT
HANDPIECE INTERPULSE COAX TIP (DISPOSABLE) ×1
HOLDER FOLEY CATH W/STRAP (MISCELLANEOUS) IMPLANT
IMMOBILIZER KNEE 20 (SOFTGOODS) ×2
IMMOBILIZER KNEE 20 THIGH 36 (SOFTGOODS) ×1 IMPLANT
KIT TURNOVER KIT A (KITS) ×2 IMPLANT
MANIFOLD NEPTUNE II (INSTRUMENTS) ×2 IMPLANT
NS IRRIG 1000ML POUR BTL (IV SOLUTION) ×2 IMPLANT
PACK TOTAL KNEE CUSTOM (KITS) ×2 IMPLANT
PADDING CAST COTTON 6X4 STRL (CAST SUPPLIES) ×2 IMPLANT
PENCIL SMOKE EVACUATOR (MISCELLANEOUS) ×2 IMPLANT
PIN DRILL FIX HALF THREAD (BIT) ×2 IMPLANT
PIN STEINMAN FIXATION KNEE (PIN) ×2 IMPLANT
PROTECTOR NERVE ULNAR (MISCELLANEOUS) ×4 IMPLANT
SET HNDPC FAN SPRY TIP SCT (DISPOSABLE) ×1 IMPLANT
STRIP CLOSURE SKIN 1/2X4 (GAUZE/BANDAGES/DRESSINGS) ×2 IMPLANT
SUT MNCRL AB 4-0 PS2 18 (SUTURE) ×2 IMPLANT
SUT STRATAFIX 0 PDS 27 VIOLET (SUTURE) ×2
SUT VIC AB 2-0 CT1 27 (SUTURE) ×3
SUT VIC AB 2-0 CT1 TAPERPNT 27 (SUTURE) ×3 IMPLANT
SUTURE STRATFX 0 PDS 27 VIOLET (SUTURE) ×1 IMPLANT
TIBIAL BASE ROT PLAT SZ 3 KNEE (Knees) ×2 IMPLANT
TRAY FOLEY MTR SLVR 14FR STAT (SET/KITS/TRAYS/PACK) ×2 IMPLANT
WATER STERILE IRR 1000ML POUR (IV SOLUTION) ×4 IMPLANT
WRAP KNEE MAXI GEL POST OP (GAUZE/BANDAGES/DRESSINGS) ×2 IMPLANT

## 2020-04-21 NOTE — Interval H&P Note (Signed)
History and Physical Interval Note:  04/21/2020 6:25 AM  Anne Garrett  has presented today for surgery, with the diagnosis of rheumatoid arthritis left knee.  The various methods of treatment have been discussed with the patient and family. After consideration of risks, benefits and other options for treatment, the patient has consented to  Procedure(s) with comments: TOTAL KNEE ARTHROPLASTY (Left) - 40min as a surgical intervention.  The patient's history has been reviewed, patient examined, no change in status, stable for surgery.  I have reviewed the patient's chart and labs.  Questions were answered to the patient's satisfaction.     Pilar Plate Denajah Farias

## 2020-04-21 NOTE — Anesthesia Procedure Notes (Signed)
Anesthesia Regional Block: Adductor canal block   Pre-Anesthetic Checklist: ,, timeout performed, Correct Patient, Correct Site, Correct Laterality, Correct Procedure, Correct Position, site marked, Risks and benefits discussed,  Surgical consent,  Pre-op evaluation,  At surgeon's request and post-op pain management  Laterality: Left and Lower  Prep: chloraprep       Needles:  Injection technique: Single-shot  Needle Type: Echogenic Needle     Needle Length: 9cm  Needle Gauge: 21     Additional Needles:   Procedures:,,,, ultrasound used (permanent image in chart),,,,  Narrative:  Start time: 04/21/2020 6:53 AM End time: 04/21/2020 7:00 AM Injection made incrementally with aspirations every 5 mL.  Performed by: Personally  Anesthesiologist: Annye Asa, MD  Additional Notes: Pt identified in Holding room.  Monitors applied. Working IV access confirmed. Sterile prep L thigh.  #21ga ECHOgenic Arrow block needle into adductor canal with US guidance.  20cc 0.75% Ropivacaine injected incrementally after negative test dose.  Patient asymptomatic, VSS, no heme aspirated, tolerated well.  Jenita Seashore, MD

## 2020-04-21 NOTE — Transfer of Care (Signed)
Immediate Anesthesia Transfer of Care Note  Patient: Anne Garrett  Procedure(s) Performed: TOTAL KNEE ARTHROPLASTY (Left Knee)  Patient Location: PACU  Anesthesia Type:Spinal and MAC combined with regional for post-op pain  Level of Consciousness: awake, alert , oriented and patient cooperative  Airway & Oxygen Therapy: Patient Spontanous Breathing and Patient connected to face mask oxygen  Post-op Assessment: Report given to RN and Post -op Vital signs reviewed and stable  Post vital signs: Reviewed and stable  Last Vitals:  Vitals Value Taken Time  BP 112/78 04/21/20 0848  Temp 37 C 04/21/20 0848  Pulse 76 04/21/20 0849  Resp 9 04/21/20 0849  SpO2 100 % 04/21/20 0849  Vitals shown include unvalidated device data.  Last Pain:  Vitals:   04/21/20 0552  TempSrc: Oral         Complications: No complications documented.

## 2020-04-21 NOTE — Discharge Instructions (Addendum)
Anne Arabian, MD Total Joint Specialist [facility] [address] [place], [place] [address] [phone]  TOTAL KNEE REPLACEMENT POSTOPERATIVE DIRECTIONS    Knee Rehabilitation, Guidelines Following Surgery  Results after knee surgery are often greatly improved when you follow the exercise, range of motion and muscle strengthening exercises prescribed by your doctor. Safety measures are also important to protect the knee from further injury. If any of these exercises cause you to have increased pain or swelling in your knee joint, decrease the amount until you are comfortable again and slowly increase them. If you have problems or questions, call your caregiver or physical therapist for advice.   BLOOD CLOT PREVENTION . Take a 325 mg Aspirin two times a day for three weeks following surgery. Then take an 81 mg Aspirin once a day for three weeks. Then discontinue Aspirin. Anne Garrett may resume your vitamins/supplements upon discharge from the hospital. . Do not take any NSAIDs (Advil, Aleve, Ibuprofen, Meloxicam, etc.) until you have discontinued the 325 mg Aspirin.  PREDNISONE INSTRUCTIONS . Take 10 mg on the evening of surgery. On Tuesday, take 10 mg by mouth twice a day. On Wednesday, take 10 mg by mouth once a day. On Thursday, resume normal dosing.  HOME CARE INSTRUCTIONS  . Remove items at home which could result in a fall. This includes throw rugs or furniture in walking pathways.  . ICE to the affected knee as much as tolerated. Icing helps control swelling. If the swelling is well controlled you will be more comfortable and rehab easier. Continue to use ice on the knee for pain and swelling from surgery. You may notice swelling that will progress down to the foot and ankle. This is normal after surgery. Elevate the leg when you are not up walking on it.    . Continue to use the breathing machine which will help keep your temperature down. It is common for  your temperature to cycle up and down following surgery, especially at night when you are not up moving around and exerting yourself. The breathing machine keeps your lungs expanded and your temperature down. . Do not place pillow under the operative knee, focus on keeping the knee straight while resting  DIET You may resume your previous home diet once you are discharged from the hospital.  DRESSING / Blanco / SHOWERING . Keep your bulky bandage on for 2 days. On the third post-operative day you may remove the Ace bandage and gauze. There is a waterproof adhesive bandage on your skin which will stay in place until your first follow-up appointment. Once you remove this you will not need to place another bandage . You may begin showering 3 days following surgery, but do not submerge the incision under water.  ACTIVITY For the first 5 days, the key is rest and control of pain and swelling . Do your home exercises twice a day starting on post-operative day 3. On the days you go to physical therapy, just do the home exercises once that day. . You should rest, ice and elevate the leg for 50 minutes out of every hour. Get up and walk/stretch for 10 minutes per hour. After 5 days you can increase your activity slowly as tolerated. . Walk with your walker as instructed. Use the walker until you are comfortable transitioning to a cane. Walk with the cane in the opposite hand of the operative leg. You may discontinue the cane once you are comfortable and walking  steadily. . Avoid periods of inactivity such as sitting longer than an hour when not asleep. This helps prevent blood clots.  . You may discontinue the knee immobilizer once you are able to perform a straight leg raise while lying down. . You may resume a sexual relationship in one month or when given the OK by your doctor.  . You may return to work once you are cleared by your doctor.  . Do not drive a car for 6 weeks or until released by your  surgeon.  . Do not drive while taking narcotics.  TED HOSE STOCKINGS Wear the elastic stockings on both legs for three weeks following surgery during the day. You may remove them at night for sleeping.  WEIGHT BEARING Weight bearing as tolerated with assist device (walker, cane, etc) as directed, use it as long as suggested by your surgeon or therapist, typically at least 4-6 weeks.  POSTOPERATIVE CONSTIPATION PROTOCOL Constipation - defined medically as fewer than three stools per week and severe constipation as less than one stool per week.  One of the most common issues patients have following surgery is constipation.  Even if you have a regular bowel pattern at home, your normal regimen is likely to be disrupted due to multiple reasons following surgery.  Combination of anesthesia, postoperative narcotics, change in appetite and fluid intake all can affect your bowels.  In order to avoid complications following surgery, here are some recommendations in order to help you during your recovery period.  . Colace (docusate) - Pick up an over-the-counter form of Colace or another stool softener and take twice a day as long as you are requiring postoperative pain medications.  Take with a full glass of water daily.  If you experience loose stools or diarrhea, hold the colace until you stool forms back up. If your symptoms do not get better within 1 week or if they get worse, check with your doctor. . Dulcolax (bisacodyl) - Pick up over-the-counter and take as directed by the product packaging as needed to assist with the movement of your bowels.  Take with a full glass of water.  Use this product as needed if not relieved by Colace only.  . MiraLax (polyethylene glycol) - Pick up over-the-counter to have on hand. MiraLax is a solution that will increase the amount of water in your bowels to assist with bowel movements.  Take as directed and can mix with a glass of water, juice, soda, coffee, or tea.  Take if you go more than two days without a movement. Do not use MiraLax more than once per day. Call your doctor if you are still constipated or irregular after using this medication for 7 days in a row.  If you continue to have problems with postoperative constipation, please contact the office for further assistance and recommendations.  If you experience "the worst abdominal pain ever" or develop nausea or vomiting, please contact the office immediatly for further recommendations for treatment.  ITCHING If you experience itching with your medications, try taking only a single pain pill, or even half a pain pill at a time.  You can also use Benadryl over the counter for itching or also to help with sleep.   MEDICATIONS See your medication summary on the "After Visit Summary" that the nursing staff will review with you prior to discharge.  You may have some home medications which will be placed on hold until you complete the course of blood thinner medication.  It  is important for you to complete the blood thinner medication as prescribed by your surgeon.  Continue your approved medications as instructed at time of discharge.  PRECAUTIONS . If you experience chest pain or shortness of breath - call 911 immediately for transfer to the hospital emergency department.  . If you develop a fever greater that 101 F, purulent drainage from wound, increased redness or drainage from wound, foul odor from the wound/dressing, or calf pain - CONTACT YOUR SURGEON.                                                   FOLLOW-UP APPOINTMENTS Make sure you keep all of your appointments after your operation with your surgeon and caregivers. You should call the office at the above phone number and make an appointment for approximately two weeks after the date of your surgery or on the date instructed by your surgeon outlined in the "After Visit Summary".  RANGE OF MOTION AND STRENGTHENING EXERCISES  Rehabilitation of  the knee is important following a knee injury or an operation. After just a few days of immobilization, the muscles of the thigh which control the knee become weakened and shrink (atrophy). Knee exercises are designed to build up the tone and strength of the thigh muscles and to improve knee motion. Often times heat used for twenty to thirty minutes before working out will loosen up your tissues and help with improving the range of motion but do not use heat for the first two weeks following surgery. These exercises can be done on a training (exercise) mat, on the floor, on a table or on a bed. Use what ever works the best and is most comfortable for you Knee exercises include:  . Leg Lifts - While your knee is still immobilized in a splint or cast, you can do straight leg raises. Lift the leg to 60 degrees, hold for 3 sec, and slowly lower the leg. Repeat 10-20 times 2-3 times daily. Perform this exercise against resistance later as your knee gets better.  Anne Garrett and Hamstring Sets - Tighten up the muscle on the front of the thigh (Quad) and hold for 5-10 sec. Repeat this 10-20 times hourly. Hamstring sets are done by pushing the foot backward against an object and holding for 5-10 sec. Repeat as with quad sets.   Leg Slides: Lying on your back, slowly slide your foot toward your buttocks, bending your knee up off the floor (only go as far as is comfortable). Then slowly slide your foot back down until your leg is flat on the floor again.  Angel Wings: Lying on your back spread your legs to the side as far apart as you can without causing discomfort.  A rehabilitation program following serious knee injuries can speed recovery and prevent re-injury in the future due to weakened muscles. Contact your doctor or a physical therapist for more information on knee rehabilitation.   POST-OPERATIVE OPIOID TAPER INSTRUCTIONS: . It is important to wean off of your opioid medication as soon as possible. If you do not  need pain medication after your surgery it is ok to stop day one. Marland Kitchen Opioids include: o Codeine, Hydrocodone(Norco, Vicodin), Oxycodone(Percocet, oxycontin) and hydromorphone amongst others.  . Long term and even short term use of opiods can cause: o Increased pain response o Dependence o  Constipation o Depression o Respiratory depression o And more.  . Withdrawal symptoms can include o Flu like symptoms o Nausea, vomiting o And more . Techniques to manage these symptoms o Hydrate well o Eat regular healthy meals o Stay active o Use relaxation techniques(deep breathing, meditating, yoga) . Do Not substitute Alcohol to help with tapering . If you have been on opioids for less than two weeks and do not have pain than it is ok to stop all together.  . Plan to wean off of opioids o This plan should start within one week post op of your joint replacement. o Maintain the same interval or time between taking each dose and first decrease the dose.  o Cut the total daily intake of opioids by one tablet each day o Next start to increase the time between doses. o The last dose that should be eliminated is the evening dose.     IF YOU ARE TRANSFERRED TO A SKILLED REHAB FACILITY If the patient is transferred to a skilled rehab facility following release from the hospital, a list of the current medications will be sent to the facility for the patient to continue.  When discharged from the skilled rehab facility, please have the facility set up the patient's [place] prior to being released. Also, the skilled facility will be responsible for providing the patient with their medications at time of release from the facility to include their pain medication, the muscle relaxants, and their blood thinner medication. If the patient is still at the rehab facility at time of the two week follow up appointment, the skilled rehab facility will also need to assist the patient in arranging  follow up appointment in our office and any transportation needs.  MAKE SURE YOU:  . Understand these instructions.  . Get help right away if you are not doing well or get worse.   DENTAL ANTIBIOTICS:  In most cases prophylactic antibiotics for Dental procdeures after total joint surgery are not necessary.  Exceptions are as follows:  1. History of prior total joint infection  2. Severely immunocompromised (Organ Transplant, cancer chemotherapy, Rheumatoid biologic meds such as Petros)  3. Poorly controlled diabetes (A1C &gt; 8.0, blood glucose over 200)  If you have one of these conditions, contact your surgeon for an antibiotic prescription, prior to your dental procedure.    Pick up stool softner and laxative for home use following surgery while on pain medications. Do not submerge incision under water. Please use good hand washing techniques while changing dressing each day. May shower starting three days after surgery. Please use a clean towel to pat the incision dry following showers. Continue to use ice for pain and swelling after surgery. Do not use any lotions or creams on the incision until instructed by your surgeon.

## 2020-04-21 NOTE — Evaluation (Signed)
Physical Therapy Evaluation Patient Details Name: Anne Garrett MRN: [id number] DOB: 10/31/1975 Today's Date: 04/21/2020   History of Present Illness  Patient is 45 y.o. female s/p Lt TKA on 04/21/20 with PMH significant for RA, osteoporosis, bil TSA with Rt revision, Bil THA, Rt ankle replacement, bil wrist arthrodesis, PLIF L5-S1, ACDF C3-5.    Clinical Impression  Anne Garrett is a 45 y.o. female POD 0 s/p Lt TKA. Patient reports independence with mobility at baseline. Patient is now limited by functional impairments (see PT problem list below) and requires min guard/supervision for transfers and gait with RW. Patient was able to ambulate ~70 feet with RW and min guard/supervision and cues for safe walker management. Patient educated on safe sequencing for stair mobility and handout provided for curb negotiation to enter home. Patient verbalized safe guarding position for people assisting with mobility. Patient instructed in exercises to facilitate ROM and circulation. Patient will benefit from continued skilled PT interventions to address impairments and progress towards PLOF. Patient has met mobility goals at adequate level for discharge home; will continue to follow if pt continues acute stay to progress towards Mod I goals.     Follow Up Recommendations Follow surgeon's recommendation for DC plan and follow-up therapies;Outpatient PT    Equipment Recommendations  None recommended by PT    Recommendations for Other Services       Precautions / Restrictions Precautions Precautions: Fall Restrictions Weight Bearing Restrictions: No Other Position/Activity Restrictions: WBAT      Mobility  Bed Mobility Overal bed mobility: Needs Assistance Bed Mobility: Supine to Sit     Supine to sit: Supervision;HOB elevated     General bed mobility comments: pt taking extra time and HOB slightly elevated, no assist required. cues to use belt to assist Lt LE off EOB.     Transfers Overall transfer level: Needs assistance Equipment used: Rolling walker (2 wheeled) Transfers: Sit to/from Stand Sit to Stand: Min guard;Supervision;From elevated surface         General transfer comment: cues for safe hand placement for power up from EOB and toilet, cues to extend Lt knee prior to sitting on low toilet.  Ambulation/Gait Ambulation/Gait assistance: Min guard;Supervision Gait Distance (Feet): 70 Feet Assistive device: Rolling walker (2 wheeled) Gait Pattern/deviations: Step-to pattern;Decreased stride length;Decreased weight shift to left;Trunk flexed Gait velocity: decr   General Gait Details: cues for step pattern and proxmity to RW, pt maintained throughout and progressed to supervision. no overt LOB or buckling at Lt knee noted.  Stairs            Wheelchair Mobility    Modified Rankin (Stroke Patients Only)       Balance Overall balance assessment: Needs assistance Sitting-balance support: Feet supported Sitting balance-Leahy Scale: Fair Sitting balance - Comments: pt performed toileting   Standing balance support: During functional activity;Bilateral upper extremity supported Standing balance-Leahy Scale: Fair Standing balance comment: pt able to don underwear standing by toilet                             Pertinent Vitals/Pain Pain Assessment: Faces Faces Pain Scale: Hurts even more Pain Location: Lt knee Pain Descriptors / Indicators: Aching;Discomfort;Tightness Pain Intervention(s): Limited activity within patient's tolerance;Repositioned;Monitored during session    Home Living Family/patient expects to be discharged to:: Private residence Living Arrangements: Spouse/significant other Available Help at Discharge: Family Type of Home: House Home Access: Stairs to enter Entrance Stairs-Rails: None Entrance  Stairs-Number of Steps: 1 Home Layout: One level (small bonus room upstairs) Home Equipment: Walker - 2  wheels;Walker - 4 wheels      Prior Function Level of Independence: Independent               Hand Dominance        Extremity/Trunk Assessment   Upper Extremity Assessment Upper Extremity Assessment: Overall WFL for tasks assessed    Lower Extremity Assessment Lower Extremity Assessment: LLE deficits/detail LLE Deficits / Details: good quad activation, no extensor lag with SLR LLE Sensation: WNL LLE Coordination: WNL    Cervical / Trunk Assessment Cervical / Trunk Assessment: Normal  Communication   Communication: No difficulties  Cognition Arousal/Alertness: Awake/alert Behavior During Therapy: WFL for tasks assessed/performed Overall Cognitive Status: Within Functional Limits for tasks assessed                                        General Comments      Exercises Total Joint Exercises Ankle Circles/Pumps: AROM;Both;10 reps;Seated Quad Sets: AROM;Left;Other reps (comment);Seated (3) Short Arc Quad: AROM;Left;Other reps (comment);Seated (3) Heel Slides: AROM;Left;Other reps (comment);Seated (3) Hip ABduction/ADduction: AROM;Left;Other reps (comment);Seated (3)   Assessment/Plan    PT Assessment Patient needs continued PT services  PT Problem List Decreased strength;Decreased range of motion;Decreased activity tolerance;Decreased balance;Decreased mobility;Decreased knowledge of use of DME;Decreased knowledge of precautions;Pain       PT Treatment Interventions DME instruction;Gait training;Stair training;Functional mobility training;Therapeutic activities;Therapeutic exercise;Balance training;Patient/family education    PT Goals (Current goals can be found in the Care Plan section)  Acute Rehab PT Goals Patient Stated Goal: stop hurting so badly and get back home PT Goal Formulation: With patient Time For Goal Achievement: 04/28/20 Potential to Achieve Goals: Good    Frequency 7X/week   Barriers to discharge         Co-evaluation               AM-PAC PT "6 Clicks" Mobility  Outcome Measure Help needed turning from your back to your side while in a flat bed without using bedrails?: None Help needed moving from lying on your back to sitting on the side of a flat bed without using bedrails?: A Little Help needed moving to and from a bed to a chair (including a wheelchair)?: A Little Help needed standing up from a chair using your arms (e.g., wheelchair or bedside chair)?: A Little Help needed to walk in hospital room?: A Little Help needed climbing 3-5 steps with a railing? : A Little 6 Click Score: 19    End of Session Equipment Utilized During Treatment: Gait belt Activity Tolerance: Patient tolerated treatment well Patient left: in chair;with call bell/phone within reach;with family/visitor present Nurse Communication: Mobility status PT Visit Diagnosis: Muscle weakness (generalized) (M62.81);Difficulty in walking, not elsewhere classified (R26.2);Pain Pain - Right/Left: Left Pain - part of body: Knee    Time: 0947-0962 PT Time Calculation (min) (ACUTE ONLY): 65 min   Charges:   PT Evaluation $PT Eval Low Complexity: 1 Low PT Treatments $Gait Training: 8-22 mins $Therapeutic Exercise: 8-22 mins $Therapeutic Activity: 8-22 mins        Verner Mould, DPT Acute Rehabilitation Services Office [phone] Pager [phone]    Jacques Navy 04/21/2020, 2:06 PM

## 2020-04-21 NOTE — Anesthesia Procedure Notes (Signed)
Spinal  Patient location during procedure: OR End time: 04/21/2020 7:17 AM Reason for block: surgical anesthesia Staffing Performed: anesthesiologist and resident/CRNA  Anesthesiologist: Annye Asa, MD Resident/CRNA: West Pugh, CRNA Preanesthetic Checklist Completed: patient identified, IV checked, site marked, risks and benefits discussed, surgical consent, monitors and equipment checked, pre-op evaluation and timeout performed Spinal Block Patient position: sitting Prep: DuraPrep and site prepped and draped Patient monitoring: blood pressure, continuous pulse ox, cardiac monitor and heart rate Approach: midline Location: L3-4 Injection technique: single-shot Needle Needle type: Pencan and Introducer  Needle gauge: 24 G Needle length: 9 cm Assessment Events: CSF return and second provider Additional Notes Pt identified in Operating room.  Monitors applied. Working IV access confirmed. Sterile prep, drape lumbar spine.  1% lido local L 2,3 by Gilford Rile, CRNA, all attempts os, repeat local L3,4 and #24ga Pencan into clear CSF L 3,4.  50mg  2% Mepivacaine injected with asp CSF beginning and end of injection.  Patient asymptomatic, VSS, no heme aspirated, tolerated well.  Jenita Seashore, MD

## 2020-04-21 NOTE — Anesthesia Procedure Notes (Signed)
Procedure Name: MAC Date/Time: 04/21/2020 7:10 AM Performed by: West Pugh, CRNA Pre-anesthesia Checklist: Patient identified, Emergency Drugs available, Suction available, Patient being monitored and Timeout performed Patient Re-evaluated:Patient Re-evaluated prior to induction Oxygen Delivery Method: Simple face mask Preoxygenation: Pre-oxygenation with 100% oxygen Induction Type: IV induction Placement Confirmation: positive ETCO2 Dental Injury: Teeth and Oropharynx as per pre-operative assessment

## 2020-04-21 NOTE — Op Note (Signed)
OPERATIVE REPORT-TOTAL KNEE ARTHROPLASTY   Pre-operative diagnosis-Rheumatoid arthritis  Left knee(s)  Post-operative diagnosis- Rheumatoid arthritis Left knee(s)  Procedure-  Left  Total Knee Arthroplasty  Surgeon- Dione Plover. Areana Kosanke, MD  Assistant- Theresa Duty, PA-C   Anesthesia-  Adductor canal block and spinal  EBL- 25 ml   Drains None  Tourniquet time-  Total Tourniquet Time Documented: Thigh (Left) - 29 minutes Total: Thigh (Left) - 29 minutes     Complications- None  Condition-PACU - hemodynamically stable.   Brief Clinical Note  Anne Garrett is a 45 y.o. year old female with end stage RA of her left knee with progressively worsening pain and dysfunction. She has constant pain, with activity and at rest and significant functional deficits with difficulties even with ADLs. She has had extensive non-op management including analgesics, injections of cortisone and viscosupplements, and home exercise program, but remains in significant pain with significant dysfunction. Radiographs show bone on bone arthritis all 3 compartments. She presents now for left Total Knee Arthroplasty.    Procedure in detail---   The patient is brought into the operating room and positioned supine on the operating table. After successful administration of  Adductor canal block and spinal,   a tourniquet is placed high on the  Left thigh(s) and the lower extremity is prepped and draped in the usual sterile fashion. Time out is performed by the operating team and then the  Left lower extremity is wrapped in Esmarch, knee flexed and the tourniquet inflated to 300 mmHg.       A midline incision is made with a ten blade through the subcutaneous tissue to the level of the extensor mechanism. A fresh blade is used to make a medial parapatellar arthrotomy. Soft tissue over the proximal medial tibia is subperiosteally elevated to the joint line with a knife and into the semimembranosus bursa with a  Cobb elevator. Soft tissue over the proximal lateral tibia is elevated with attention being paid to avoiding the patellar tendon on the tibial tubercle. The patella is everted, knee flexed 90 degrees and the ACL and PCL are removed. Findings are bone on bone all 3 compartments with significant synovitis        The drill is used to create a starting hole in the distal femur and the canal is thoroughly irrigated with sterile saline to remove the fatty contents. The 5 degree Left  valgus alignment guide is placed into the femoral canal and the distal femoral cutting block is pinned to remove 9 mm off the distal femur. Resection is made with an oscillating saw.      The tibia is subluxed forward and the menisci are removed. The extramedullary alignment guide is placed referencing proximally at the medial aspect of the tibial tubercle and distally along the second metatarsal axis and tibial crest. The block is pinned to remove 37mm off the more deficient medial  side. Resection is made with an oscillating saw. Size 3is the most appropriate size for the tibia and the proximal tibia is prepared with the modular drill and keel punch for that size.      The femoral sizing guide is placed and size 4 is most appropriate. Rotation is marked off the epicondylar axis and confirmed by creating a rectangular flexion gap at 90 degrees. The size 4 cutting block is pinned in this rotation and the anterior, posterior and chamfer cuts are made with the oscillating saw. The intercondylar block is then placed and that cut is made.  Trial size 3 tibial component, trial size 4 posterior stabilized femur and a 7  mm posterior stabilized rotating platform insert trial is placed. Full extension is achieved with excellent varus/valgus and anterior/posterior balance throughout full range of motion. The patella is everted and thickness measured to be 21  mm. Free hand resection is taken to 12 mm, a 32 template is placed, lug holes are  drilled, trial patella is placed, and it tracks normally. Osteophytes are removed off the posterior femur with the trial in place. All trials are removed and the cut bone surfaces prepared with pulsatile lavage. Cement is mixed and once ready for implantation, the size 3 tibial implant, size  4 posterior stabilized femoral component, and the size 32 patella are cemented in place and the patella is held with the clamp. The trial insert is placed and the knee held in full extension. The Exparel (20 ml mixed with 60 ml saline) is injected into the extensor mechanism, posterior capsule, medial and lateral gutters and subcutaneous tissues.  All extruded cement is removed and once the cement is hard the permanent 7 mm posterior stabilized rotating platform insert is placed into the tibial tray.      The wound is copiously irrigated with saline solution and the extensor mechanism closed with # 0 Stratofix suture. The tourniquet is released for a total tourniquet time of 29  minutes. Flexion against gravity is 140 degrees and the patella tracks normally. Subcutaneous tissue is closed with 2.0 vicryl and subcuticular with running 4.0 Monocryl. The incision is cleaned and dried and steri-strips and a bulky sterile dressing are applied. The limb is placed into a knee immobilizer and the patient is awakened and transported to recovery in stable condition.      Please note that a surgical assistant was a medical necessity for this procedure in order to perform it in a safe and expeditious manner. Surgical assistant was necessary to retract the ligaments and vital neurovascular structures to prevent injury to them and also necessary for proper positioning of the limb to allow for anatomic placement of the prosthesis.   Dione Plover Yamato Kopf, MD    04/21/2020, 8:13 AM

## 2020-04-21 NOTE — Anesthesia Postprocedure Evaluation (Signed)
Anesthesia Post Note  Patient: Anne Garrett  Procedure(s) Performed: TOTAL KNEE ARTHROPLASTY (Left Knee)     Patient location during evaluation: PACU Anesthesia Type: Spinal Level of consciousness: awake and alert, patient cooperative and oriented Pain management: pain level controlled Vital Signs Assessment: post-procedure vital signs reviewed and stable Respiratory status: spontaneous breathing, nonlabored ventilation and respiratory function stable Cardiovascular status: blood pressure returned to baseline and stable Postop Assessment: no apparent nausea or vomiting, spinal receding, patient able to bend at knees and adequate PO intake Anesthetic complications: no   No complications documented.  Last Vitals:  Vitals:   04/21/20 0911 04/21/20 0915  BP: 123/90 (!) 135/94  Pulse: 76 74  Resp: 19 20  Temp:    SpO2: 100% 100%    Last Pain:  Vitals:   04/21/20 0911  TempSrc:   PainSc: 7                  Esly Selvage,E. Arin Vanosdol

## 2020-04-22 ENCOUNTER — Encounter (HOSPITAL_COMMUNITY): Payer: Self-pay | Admitting: Orthopedic Surgery

## 2020-04-22 ENCOUNTER — Other Ambulatory Visit (HOSPITAL_COMMUNITY): Payer: Self-pay

## 2020-04-22 MED ORDER — PREDNISONE 5 MG PO TABS
5.0000 mg | ORAL_TABLET | Freq: Every day | ORAL | 1 refills | Status: DC
  Filled 2020-04-22 – 2020-04-23 (×2): qty 180, 90d supply, fill #0
  Filled 2020-09-14: qty 180, 90d supply, fill #1

## 2020-04-23 ENCOUNTER — Other Ambulatory Visit (HOSPITAL_COMMUNITY): Payer: Self-pay

## 2020-04-24 ENCOUNTER — Other Ambulatory Visit: Payer: Self-pay | Admitting: Pharmacist

## 2020-04-24 ENCOUNTER — Other Ambulatory Visit (HOSPITAL_COMMUNITY): Payer: Self-pay

## 2020-04-24 DIAGNOSIS — M25562 Pain in left knee: Secondary | ICD-10-CM | POA: Diagnosis not present

## 2020-04-24 MED ORDER — ACTEMRA ACTPEN 162 MG/0.9ML ~~LOC~~ SOAJ
SUBCUTANEOUS | 2 refills | Status: DC
  Filled 2020-04-24: qty 3.6, 28d supply, fill #0
  Filled 2020-05-28: qty 3.6, 28d supply, fill #1
  Filled 2020-06-24: qty 3.6, 28d supply, fill #2

## 2020-04-24 MED ORDER — ACTEMRA ACTPEN 162 MG/0.9ML ~~LOC~~ SOAJ
SUBCUTANEOUS | 2 refills | Status: DC
  Filled 2020-04-24: qty 0.9, 30d supply, fill #0

## 2020-04-28 ENCOUNTER — Other Ambulatory Visit (HOSPITAL_COMMUNITY): Payer: Self-pay

## 2020-04-28 ENCOUNTER — Encounter (INDEPENDENT_AMBULATORY_CARE_PROVIDER_SITE_OTHER): Payer: Self-pay | Admitting: Otolaryngology

## 2020-04-28 ENCOUNTER — Other Ambulatory Visit: Payer: Self-pay

## 2020-04-28 ENCOUNTER — Ambulatory Visit (INDEPENDENT_AMBULATORY_CARE_PROVIDER_SITE_OTHER): Payer: 59 | Admitting: Otolaryngology

## 2020-04-28 VITALS — Temp 97.2°F

## 2020-04-28 DIAGNOSIS — J358 Other chronic diseases of tonsils and adenoids: Secondary | ICD-10-CM | POA: Diagnosis not present

## 2020-04-28 DIAGNOSIS — R198 Other specified symptoms and signs involving the digestive system and abdomen: Secondary | ICD-10-CM

## 2020-04-28 DIAGNOSIS — K219 Gastro-esophageal reflux disease without esophagitis: Secondary | ICD-10-CM

## 2020-04-28 DIAGNOSIS — R0989 Other specified symptoms and signs involving the circulatory and respiratory systems: Secondary | ICD-10-CM

## 2020-04-28 MED ORDER — OMEPRAZOLE 40 MG PO CPDR
40.0000 mg | DELAYED_RELEASE_CAPSULE | Freq: Every day | ORAL | 1 refills | Status: DC
  Filled 2020-04-28: qty 30, 30d supply, fill #0

## 2020-04-28 NOTE — Progress Notes (Signed)
HPI: Anne Garrett is a 45 y.o. female who presents is referred by her PCP for evaluation of sensation of something in her throat.  She feels like she has to swallow all of the time.  She feels like it is in the area of the uvula.  Sometimes she feels like she cannot breathe well.  Sometimes she does not feel like she swallows well. She has had a previous anterior cervical fusion on the left side performed in 2017. She has had history of tonsil stones and gargles with Listerine or scope on a regular basis. She denies symptoms of reflux or heartburn. She does not feel like she has fullness lower in the throat mostly high in the throat in the region of the uvula..  Past Medical History:  Diagnosis Date  . Adrenal insufficiency ([place])    secondary to Prednisone  . Eczema    coner oif eye lid  . Headache    migraines  . Intervertebral disk disease   . Juvenile idiopathic arthritis ([place])   . LGSIL (low grade squamous intraepithelial dysplasia) 08/20/2013   Formatting of this note might be different from the original. On pap smear June 2015.   HR HPV positive.  Colposcopy and cervical AND vaginal biopsies done Aug 2015  . Osteoporosis   . Rheumatoid arthritis ([place])    Juvenile onset   Past Surgical History:  Procedure Laterality Date  . ANKLE ARTHROPLASTY Right   . ANTERIOR CERVICAL DECOMP/DISCECTOMY FUSION N/A 06/19/2015   Procedure: C3-4 C4-5 Anterior cervical decompression/diskectomy/fusion;  Surgeon: Jovita Gamma, MD;  Location: [facility];  Service: Neurosurgery;  Laterality: N/A;  C3-4 C4-5 Anterior cervical decompression/diskectomy/fusion  . APPENDECTOMY    . CARPAL TUNNEL RELEASE Right   . CESAREAN SECTION    . CHOLECYSTECTOMY    . CHOLECYSTECTOMY  2006  . COLONOSCOPY    . ESSURE TUBAL LIGATION     Essure Implant-Permanent birth control  . excision to palm Left    excision of mass to right plam  . FOOT ARTHRODESIS Left 03/01/2019   Procedure: Left talonavicular and  naviculocuneiform arthrodesis;  Surgeon: [name], MD;  Location: [facility];  Service: Orthopedics;  Laterality: Left;  . HARDWARE REMOVAL Right 02/02/2017   Procedure: RIGHT WRIST DEEP IMPLANT REMOVAL, RIGHT WRIST AND HAND EXTENSOR TENDON RECONSTRUCTION, TENDON TRANSFER AND RECONSTRUCTION AS INDICATED;  Surgeon: Iran Planas, MD;  Location: [facility];  Service: Orthopedics;  Laterality: Right;  . IRRIGATION AND DEBRIDEMENT SHOULDER Right 11/13/2019   Procedure: IRRIGATION AND DEBRIDEMENT RIGHT SHOULDER WITH WOUND CLOSURE;  Surgeon: [name], MD;  Location: [facility];  Service: Orthopedics;  Laterality: Right;  77min  . JOINT REPLACEMENT Bilateral    bilat hip replacements  . ORIF FEMUR FRACTURE Right   . OTHER SURGICAL HISTORY  06/04/2016   Revision of PLIF  . plif l5-s1 arthrodeisis    2018  . POSTERIOR CERVICAL FUSION/FORAMINOTOMY N/A 07/12/2015   Procedure: C2 to C5 Cervical laminectomy, C2 to C5 posterior cervical arthrodesis with instrumentation and bone graft;  Surgeon: Jovita Gamma, MD;  Location: [facility];  Service: Neurosurgery;  Laterality: N/A;  C2 to C5 Cervical laminectomy, C2  to C5 posterior cervical arthrodesis with instrumentation and bone graft  . REPAIR EXTENSOR TENDON Right 02/02/2017   Procedure: REPAIR EXTENSOR TENDON;  Surgeon: Iran Planas, MD;  Location: [facility];  Service: Orthopedics;  Laterality: Right;  . REVERSE SHOULDER ARTHROPLASTY Left 09/07/2018   Procedure: REVERSE SHOULDER ARTHROPLASTY;  Surgeon: Onnie Graham,  Lennette Bihari, MD;  Location: [facility];  Service: Orthopedics;  Laterality: Left;  . REVERSE SHOULDER ARTHROPLASTY Right 09/20/2019   Procedure: REVERSE SHOULDER ARTHROPLASTY;  Surgeon: [name], MD;  Location: [facility];  Service: Orthopedics;  Laterality: Right;  124min  . REVISION TOTAL SHOULDER TO REVERSE TOTAL SHOULDER Right 11/01/2019   Procedure: revision right total shoulder to reverse total shoulder;  Surgeon: [name], MD;  Location: [facility];  Service: Orthopedics;  Laterality: Right;  exparel block  . right foot surgery Right    right jfoot arthrodesis and right ankle arthroplasty  . TOTAL KNEE ARTHROPLASTY Left 04/21/2020   Procedure: TOTAL KNEE ARTHROPLASTY;  Surgeon: [name], MD;  Location: [facility];  Service: Orthopedics;  Laterality: Left;  45min  . WRIST SURGERY Bilateral    arthrodesis   Social History   Socioeconomic History  . Marital status: Single    Spouse name: Not on file  . Number of children: 2  . Years of education: 30  . Highest education level: Not on file  Occupational History  . Occupation: Compliance Furniture conservator/restorer: [employer]  Tobacco Use  . Smoking status: Former Smoker    Packs/day: 0.25    Years: 20.00    Pack years: 5.00    Types: Cigarettes    Quit date: 07/02/2015    Years since quitting: 4.8  . Smokeless tobacco: Never Used  Vaping Use  . Vaping Use: Never used  Substance and Sexual Activity  . Alcohol use: Yes    Alcohol/week: 2.0 standard drinks    Types: 2 Glasses of wine per week    Comment: social  . Drug use: No  . Sexual activity: Yes    Birth control/protection: Other-see comments    Comment: essure coils  Other Topics Concern  . Not on file  Social History Narrative   Lives at home w/ 2 cats and child every other weekend   Significant other - Gerald Stabs   Right-hand   Drinks about 2 cups of coffee or soda per day   Social Determinants of Radio broadcast assistant Strain: Not on file  Food Insecurity: Not on file  Transportation Needs: Not on file  Physical Activity: Not on file  Stress: Not on file  Social Connections: Not on file   Family History  Problem Relation Age of Onset  . Breast cancer Mother 33  . Nephritis Father   . Epilepsy Father   . Breast cancer Maternal Aunt        2 mat aunts   . Multiple sclerosis Maternal Uncle   . Psoriasis Paternal Aunt   . Diabetes Daughter    Allergies  Allergen Reactions  . Sulfa  Antibiotics Itching and Rash    Head to toe  . Teriparatide Other (See Comments)    Severe Muscle Pain, Fort[place]o  . Valproic Acid And Related Rash    Head to toe  . Celexa [Citalopram] Diarrhea       . Diflucan [Fluconazole] Rash    Fixed drug eruption  . Leflunomide Diarrhea  . Paroxetine Hcl Diarrhea  . Zoloft [Sertraline] Diarrhea   Prior to Admission medications   Medication Sig Start Date End Date Taking? Authorizing Provider  aspirin EC 325 MG tablet Take 1 tablet (325 mg total) by mouth 2 (two) times daily for 21 days. Then take one 81 mg aspirin once a day for three weeks. Then discontinue aspirin. Begin the day following surgery. 04/21/20 05/12/20  Edmisten, Kristie L, PA  Biotin w/ Vitamins C & E (HAIR/SKIN/NAILS PO) Take 1 tablet by mouth daily.    [provider]  Butalbital-APAP-Caffeine 50-325-40 MG capsule TAKE 1 CAPSULE BY MOUTH DAILY AS NEEDED. Patient taking differently: Take 1 capsule by mouth daily as needed for headache. 03/25/20 03/25/21  [name], MD  Calcium Carb-Cholecalciferol (CALCIUM+D3 PO) Take 1 tablet by mouth daily.    [provider]  Cholecalciferol (VITAMIN D3) 50 MCG (2000 UT) TABS Take 2,000 Units by mouth daily.    [provider]  cyclobenzaprine (FLEXERIL) 10 MG tablet TAKE 1 TABLET (10 MG TOTAL) BY MOUTH 3 (THREE) TIMES DAILY AS NEEDED. 02/15/20 02/14/21  [name], MD  cyclobenzaprine (FLEXERIL) 10 MG tablet TAKE 1 TABLET BY MOUTH TWICE DAILY AS NEEDED FOR SPASM 10/15/19 10/14/20  [name], MD  gabapentin (NEURONTIN) 300 MG capsule Take 1 capsule three times a day for two weeks following surgery.Then take 1 capsule two times a day for two weeks. Then take 1 capsule once a day for two weeks. Then discontinue. 04/21/20   Edmisten, Ok Anis, PA  methocarbamol (ROBAXIN) 500 MG tablet Take 1 tablet (500 mg total) by mouth every 6 (six) hours as needed for muscle spasms. 04/21/20   Edmisten, Kristie L, PA  nystatin  (MYCOSTATIN) 100000 UNIT/ML suspension SWISH AND SPIT 5MLS BY MOUTH 4 TIMES DAILY AS DIRECTED Patient taking differently: Take 5 mLs by mouth 4 (four) times daily as needed (yeast infection). 02/15/20 02/14/21  [name], MD  OVER THE COUNTER MEDICATION Take 1 capsule by mouth daily. Juice plus Omega/Vegetable    [provider]  oxyCODONE (ROXICODONE) 5 MG immediate release tablet Take 1 to 2 tablets (5-10 mg total) by mouth every 6 hours as needed for severe pain. 04/21/20 04/21/21  Edmisten, Ok Anis, PA  predniSONE (DELTASONE) 5 MG tablet TAKE 1 TO 2 TABLETS BY MOUTH DAILY 11/20/19 11/19/20  Bjorn Pippin, PA-C  predniSONE (DELTASONE) 5 MG tablet TAKE 1 TABLET BY MOUTH ONCE DAILY 04/30/19 04/29/20  [name], MD  predniSONE (DELTASONE) 5 MG tablet Take 1-2 tablets (5-10 mg total) by mouth daily. 04/22/20     PRESCRIPTION MEDICATION Apply 1 application topically See admin instructions. Apply dime-size amount to face 1-2 TIMES per day as needed   Metronidazole 1% + Ivermectin 1% + Azelaic Acid 15%    [provider]  Tocilizumab (ACTEMRA ACTPEN) 162 MG/0.9ML SOAJ Inject 0.31ml subcutaneously once weekly 04/24/20   Tresa Garter, MD  traMADol (ULTRAM) 50 MG tablet TAKE 1 TO 2 TABLETS BY MOUTH EVERY 8 HOURS AS NEEDED FOR PAIN. Patient taking differently: Take by mouth every 8 (eight) hours as needed. for pain 02/29/20 08/27/20  Bjorn Pippin, PA-C  traMADol (ULTRAM) 50 MG tablet Take 1 tablet (50 mg total) by mouth 3 (three) times daily as needed. 04/09/20     triamcinolone cream (KENALOG) 0.1 % Apply 1 application topically 2 (two) times daily as needed. Patient taking differently: Apply 1 application topically 2 (two) times daily as needed (eczema). 05/11/19   [name], MD     Positive ROS: Otherwise negative  All other systems have been reviewed and were otherwise negative with the exception of those mentioned in the HPI and as above.  Physical  Exam: Constitutional: Alert, well-appearing, no acute distress Ears: External ears without lesions or tenderness. Ear canals are clear bilaterally with intact, clear TMs.  Nasal: External nose without lesions. Septum is  midline with mild rhinitis.. Clear nasal passages bilaterally.  Both middle meatus regions are clear with no signs of infection and minimal mucus within the nasal cavity. Oral: Lips and gums without lesions. Tongue and palate mucosa without lesions. Posterior oropharynx clear.  Uvula is slightly elongated.  Patient has a small mouth and a large tongue.  She has a cyst on the superior aspect of the right tonsil.  On palpating this area with a Q-tip as well as a tongue blade this is where she feels like the nodule is located that is in her throat.  There is no signs of infection. Fiberoptic laryngoscopy was performed to the right nostril.  The nasopharynx was clear.  The base of tongue vallecula and epiglottis were normal.  Vocal cords were clear with normal vocal mobility.  She had mild edema of the arytenoid mucosa but no mucosal abnormalities were noted. Neck: No palpable adenopathy or masses.  No palpable adenopathy in the neck. Respiratory: Breathing comfortably  Skin: No facial/neck lesions or rash noted.  Laryngoscopy  Date/Time: 04/28/2020 5:46 PM Performed by: Rozetta Nunnery, MD Authorized by: Rozetta Nunnery, MD   Consent:    Consent obtained:  Verbal   Consent given by:  Patient Procedure details:    Indications: direct visualization of the upper aerodigestive tract     Medication:  Afrin   Instrument: flexible fiberoptic laryngoscope     Scope location: right nare   Sinus:    Right nasopharynx: normal   Mouth:    Oropharynx: normal     Vallecula: normal     Base of tongue: normal     Epiglottis: normal   Throat:    Pyriform sinus: normal     True vocal cords: normal   Comments:     On fiberoptic laryngoscopy through the right nostril the  nasopharynx was clear.  Uvula was normal.  Base of tongue was clear.  Epiglottis and vocal cords were normal.  She had mild arytenoid edema consistent with possible laryngeal pharyngeal reflux.    Assessment: Briefly reviewed her globus symptoms with her.  She does have a cyst on the superior aspect of the right tonsil that may be contributing some to this.  I doubt her uvula is causing her symptoms. Although her symptoms are high in her throat laryngeal pharyngeal reflux could be contributing some to this.  Plan: I reassured the patient of normal upper airway examination that this will not cause any airway problems for her. I did discuss briefly that she has a cyst on the superior aspect of the right tonsil that could be contributing to this and would recommend gargling on a regular basis. I also prescribed omeprazole 40 mg daily before dinner as silent reflux that occurs at night could be contributing some to her globus type symptoms. She will follow-up as needed.    Radene Journey, MD   CC:

## 2020-04-30 ENCOUNTER — Other Ambulatory Visit (HOSPITAL_COMMUNITY): Payer: Self-pay

## 2020-04-30 DIAGNOSIS — M25562 Pain in left knee: Secondary | ICD-10-CM | POA: Diagnosis not present

## 2020-04-30 MED ORDER — OXYCODONE HCL 5 MG PO TABS
5.0000 mg | ORAL_TABLET | Freq: Three times a day (TID) | ORAL | 0 refills | Status: DC
  Filled 2020-04-30: qty 42, 7d supply, fill #0

## 2020-05-02 DIAGNOSIS — M25562 Pain in left knee: Secondary | ICD-10-CM | POA: Diagnosis not present

## 2020-05-05 DIAGNOSIS — M25562 Pain in left knee: Secondary | ICD-10-CM | POA: Diagnosis not present

## 2020-05-09 DIAGNOSIS — M25562 Pain in left knee: Secondary | ICD-10-CM | POA: Diagnosis not present

## 2020-05-12 DIAGNOSIS — Z96611 Presence of right artificial shoulder joint: Secondary | ICD-10-CM | POA: Diagnosis not present

## 2020-05-13 ENCOUNTER — Encounter: Payer: Self-pay | Admitting: Family Medicine

## 2020-05-16 DIAGNOSIS — M79672 Pain in left foot: Secondary | ICD-10-CM | POA: Diagnosis not present

## 2020-05-20 ENCOUNTER — Other Ambulatory Visit (HOSPITAL_COMMUNITY): Payer: Self-pay

## 2020-05-21 ENCOUNTER — Other Ambulatory Visit (HOSPITAL_BASED_OUTPATIENT_CLINIC_OR_DEPARTMENT_OTHER): Payer: Self-pay

## 2020-05-23 ENCOUNTER — Encounter: Payer: Self-pay | Admitting: Family Medicine

## 2020-05-26 ENCOUNTER — Other Ambulatory Visit (HOSPITAL_COMMUNITY): Payer: Self-pay

## 2020-05-26 DIAGNOSIS — M79672 Pain in left foot: Secondary | ICD-10-CM | POA: Diagnosis not present

## 2020-05-26 DIAGNOSIS — S92355A Nondisplaced fracture of fifth metatarsal bone, left foot, initial encounter for closed fracture: Secondary | ICD-10-CM | POA: Diagnosis not present

## 2020-05-26 MED ORDER — PANTOPRAZOLE SODIUM 20 MG PO TBEC
20.0000 mg | DELAYED_RELEASE_TABLET | Freq: Every day | ORAL | 3 refills | Status: DC
Start: 2020-05-26 — End: 2021-05-17
  Filled 2020-05-26 – 2020-05-27 (×2): qty 90, 90d supply, fill #0
  Filled 2020-06-25 – 2020-08-21 (×2): qty 90, 90d supply, fill #1
  Filled 2020-11-15 – 2020-11-24 (×2): qty 90, 90d supply, fill #2
  Filled 2020-11-24: qty 90, 90d supply, fill #0
  Filled 2021-02-14: qty 90, 90d supply, fill #1

## 2020-05-26 NOTE — Addendum Note (Signed)
Addended by: Billey Chang on: 05/26/2020 12:46 PM   Modules accepted: Orders

## 2020-05-27 ENCOUNTER — Other Ambulatory Visit (HOSPITAL_COMMUNITY): Payer: Self-pay

## 2020-05-28 ENCOUNTER — Other Ambulatory Visit (HOSPITAL_COMMUNITY): Payer: Self-pay

## 2020-05-30 ENCOUNTER — Other Ambulatory Visit (HOSPITAL_COMMUNITY): Payer: Self-pay

## 2020-05-30 DIAGNOSIS — Z471 Aftercare following joint replacement surgery: Secondary | ICD-10-CM | POA: Diagnosis not present

## 2020-05-30 DIAGNOSIS — Z96652 Presence of left artificial knee joint: Secondary | ICD-10-CM | POA: Diagnosis not present

## 2020-06-03 ENCOUNTER — Other Ambulatory Visit (HOSPITAL_COMMUNITY): Payer: Self-pay

## 2020-06-03 DIAGNOSIS — M2550 Pain in unspecified joint: Secondary | ICD-10-CM | POA: Diagnosis not present

## 2020-06-03 DIAGNOSIS — M150 Primary generalized (osteo)arthritis: Secondary | ICD-10-CM | POA: Diagnosis not present

## 2020-06-03 DIAGNOSIS — Z6823 Body mass index (BMI) 23.0-23.9, adult: Secondary | ICD-10-CM | POA: Diagnosis not present

## 2020-06-03 DIAGNOSIS — M810 Age-related osteoporosis without current pathological fracture: Secondary | ICD-10-CM | POA: Diagnosis not present

## 2020-06-03 DIAGNOSIS — Z79899 Other long term (current) drug therapy: Secondary | ICD-10-CM | POA: Diagnosis not present

## 2020-06-03 DIAGNOSIS — M0579 Rheumatoid arthritis with rheumatoid factor of multiple sites without organ or systems involvement: Secondary | ICD-10-CM | POA: Diagnosis not present

## 2020-06-03 MED ORDER — LEFLUNOMIDE 10 MG PO TABS
10.0000 mg | ORAL_TABLET | Freq: Every day | ORAL | 2 refills | Status: DC
  Filled 2020-06-03: qty 30, 30d supply, fill #0
  Filled 2020-06-28: qty 30, 30d supply, fill #1

## 2020-06-20 ENCOUNTER — Encounter: Payer: Self-pay | Admitting: Family Medicine

## 2020-06-20 ENCOUNTER — Other Ambulatory Visit: Payer: Self-pay

## 2020-06-20 ENCOUNTER — Ambulatory Visit
Admission: RE | Admit: 2020-06-20 | Discharge: 2020-06-20 | Disposition: A | Discharge status: Home / Self Care | Payer: 59 | Source: Ambulatory Visit | Attending: Family Medicine | Admitting: Family Medicine

## 2020-06-20 DIAGNOSIS — M8588 Other specified disorders of bone density and structure, other site: Secondary | ICD-10-CM | POA: Diagnosis not present

## 2020-06-20 DIAGNOSIS — M818 Other osteoporosis without current pathological fracture: Secondary | ICD-10-CM

## 2020-06-24 ENCOUNTER — Other Ambulatory Visit (HOSPITAL_COMMUNITY): Payer: Self-pay

## 2020-06-25 ENCOUNTER — Other Ambulatory Visit (HOSPITAL_COMMUNITY): Payer: Self-pay

## 2020-06-25 ENCOUNTER — Other Ambulatory Visit: Payer: Self-pay

## 2020-06-25 ENCOUNTER — Other Ambulatory Visit: Payer: Self-pay | Admitting: Family Medicine

## 2020-06-25 ENCOUNTER — Encounter: Payer: Self-pay | Admitting: Family Medicine

## 2020-06-25 DIAGNOSIS — S92354D Nondisplaced fracture of fifth metatarsal bone, right foot, subsequent encounter for fracture with routine healing: Secondary | ICD-10-CM | POA: Diagnosis not present

## 2020-06-25 DIAGNOSIS — M79672 Pain in left foot: Secondary | ICD-10-CM | POA: Diagnosis not present

## 2020-06-25 MED ORDER — BUTALBITAL-APAP-CAFFEINE 50-325-40 MG PO CAPS: 1.0000 | ORAL_CAPSULE | Freq: Every day | ORAL | 0 refills | Status: DC | PRN

## 2020-06-25 MED ORDER — BUTALBITAL-APAP-CAFFEINE 50-325-40 MG PO TABS
1.0000 | ORAL_TABLET | Freq: Four times a day (QID) | ORAL | 0 refills | Status: DC | PRN
  Filled 2020-06-25: qty 30, 7d supply, fill #0

## 2020-06-26 ENCOUNTER — Other Ambulatory Visit (HOSPITAL_COMMUNITY): Payer: Self-pay

## 2020-06-26 MED ORDER — TRAMADOL HCL 50 MG PO TABS
50.0000 mg | ORAL_TABLET | Freq: Three times a day (TID) | ORAL | 1 refills | Status: DC | PRN
  Filled 2020-06-26: qty 120, 20d supply, fill #0
  Filled 2020-08-04: qty 120, 20d supply, fill #1

## 2020-06-27 ENCOUNTER — Other Ambulatory Visit (HOSPITAL_COMMUNITY): Payer: Self-pay

## 2020-06-27 ENCOUNTER — Other Ambulatory Visit: Payer: Self-pay

## 2020-06-27 DIAGNOSIS — M818 Other osteoporosis without current pathological fracture: Secondary | ICD-10-CM

## 2020-06-27 DIAGNOSIS — Z7952 Long term (current) use of systemic steroids: Secondary | ICD-10-CM

## 2020-06-27 DIAGNOSIS — N959 Unspecified menopausal and perimenopausal disorder: Secondary | ICD-10-CM

## 2020-06-27 DIAGNOSIS — D84821 Immunodeficiency due to drugs: Secondary | ICD-10-CM

## 2020-06-30 ENCOUNTER — Other Ambulatory Visit (HOSPITAL_COMMUNITY): Payer: Self-pay

## 2020-07-01 ENCOUNTER — Other Ambulatory Visit (HOSPITAL_COMMUNITY): Payer: Self-pay

## 2020-07-02 DIAGNOSIS — G4733 Obstructive sleep apnea (adult) (pediatric): Secondary | ICD-10-CM | POA: Diagnosis not present

## 2020-07-04 DIAGNOSIS — M4316 Spondylolisthesis, lumbar region: Secondary | ICD-10-CM | POA: Diagnosis not present

## 2020-07-04 DIAGNOSIS — R030 Elevated blood-pressure reading, without diagnosis of hypertension: Secondary | ICD-10-CM | POA: Diagnosis not present

## 2020-07-04 DIAGNOSIS — S32009K Unspecified fracture of unspecified lumbar vertebra, subsequent encounter for fracture with nonunion: Secondary | ICD-10-CM | POA: Diagnosis not present

## 2020-07-10 ENCOUNTER — Other Ambulatory Visit: Payer: Self-pay | Admitting: Neurosurgery

## 2020-07-10 DIAGNOSIS — S32009K Unspecified fracture of unspecified lumbar vertebra, subsequent encounter for fracture with nonunion: Secondary | ICD-10-CM

## 2020-07-24 DIAGNOSIS — D2261 Melanocytic nevi of right upper limb, including shoulder: Secondary | ICD-10-CM | POA: Diagnosis not present

## 2020-07-24 DIAGNOSIS — D1801 Hemangioma of skin and subcutaneous tissue: Secondary | ICD-10-CM | POA: Diagnosis not present

## 2020-07-24 DIAGNOSIS — B078 Other viral warts: Secondary | ICD-10-CM | POA: Diagnosis not present

## 2020-07-24 DIAGNOSIS — D485 Neoplasm of uncertain behavior of skin: Secondary | ICD-10-CM | POA: Diagnosis not present

## 2020-07-24 DIAGNOSIS — D2271 Melanocytic nevi of right lower limb, including hip: Secondary | ICD-10-CM | POA: Diagnosis not present

## 2020-07-24 DIAGNOSIS — D2272 Melanocytic nevi of left lower limb, including hip: Secondary | ICD-10-CM | POA: Diagnosis not present

## 2020-07-24 DIAGNOSIS — D2262 Melanocytic nevi of left upper limb, including shoulder: Secondary | ICD-10-CM | POA: Diagnosis not present

## 2020-07-24 DIAGNOSIS — D225 Melanocytic nevi of trunk: Secondary | ICD-10-CM | POA: Diagnosis not present

## 2020-07-24 DIAGNOSIS — L718 Other rosacea: Secondary | ICD-10-CM | POA: Diagnosis not present

## 2020-07-24 DIAGNOSIS — L821 Other seborrheic keratosis: Secondary | ICD-10-CM | POA: Diagnosis not present

## 2020-07-25 ENCOUNTER — Ambulatory Visit
Admission: RE | Admit: 2020-07-25 | Discharge: 2020-07-25 | Disposition: A | Discharge status: Home / Self Care | Payer: 59 | Source: Ambulatory Visit | Attending: Neurosurgery | Admitting: Neurosurgery

## 2020-07-25 DIAGNOSIS — M47816 Spondylosis without myelopathy or radiculopathy, lumbar region: Secondary | ICD-10-CM | POA: Diagnosis not present

## 2020-07-25 DIAGNOSIS — M4807 Spinal stenosis, lumbosacral region: Secondary | ICD-10-CM | POA: Diagnosis not present

## 2020-07-25 DIAGNOSIS — M5126 Other intervertebral disc displacement, lumbar region: Secondary | ICD-10-CM | POA: Diagnosis not present

## 2020-07-25 DIAGNOSIS — S32009K Unspecified fracture of unspecified lumbar vertebra, subsequent encounter for fracture with nonunion: Secondary | ICD-10-CM

## 2020-07-28 ENCOUNTER — Other Ambulatory Visit (HOSPITAL_COMMUNITY): Payer: Self-pay

## 2020-07-28 ENCOUNTER — Ambulatory Visit: Payer: 59 | Attending: Family Medicine | Admitting: Pharmacist

## 2020-07-28 ENCOUNTER — Other Ambulatory Visit: Payer: Self-pay | Admitting: Pharmacist

## 2020-07-28 ENCOUNTER — Other Ambulatory Visit: Payer: Self-pay

## 2020-07-28 DIAGNOSIS — Z79899 Other long term (current) drug therapy: Secondary | ICD-10-CM

## 2020-07-28 DIAGNOSIS — M2550 Pain in unspecified joint: Secondary | ICD-10-CM | POA: Diagnosis not present

## 2020-07-28 DIAGNOSIS — Z6823 Body mass index (BMI) 23.0-23.9, adult: Secondary | ICD-10-CM | POA: Diagnosis not present

## 2020-07-28 DIAGNOSIS — M150 Primary generalized (osteo)arthritis: Secondary | ICD-10-CM | POA: Diagnosis not present

## 2020-07-28 DIAGNOSIS — M810 Age-related osteoporosis without current pathological fracture: Secondary | ICD-10-CM | POA: Diagnosis not present

## 2020-07-28 DIAGNOSIS — M0579 Rheumatoid arthritis with rheumatoid factor of multiple sites without organ or systems involvement: Secondary | ICD-10-CM | POA: Diagnosis not present

## 2020-07-28 MED ORDER — ACTEMRA ACTPEN 162 MG/0.9ML ~~LOC~~ SOAJ: SUBCUTANEOUS | 2 refills | Status: DC

## 2020-07-28 MED ORDER — ACTEMRA ACTPEN 162 MG/0.9ML ~~LOC~~ SOAJ
SUBCUTANEOUS | 2 refills | Status: DC
  Filled 2020-07-28: qty 3.6, fill #0
  Filled 2020-08-05: qty 3.6, 30d supply, fill #0
  Filled 2020-09-01: qty 3.6, 30d supply, fill #1
  Filled 2020-10-06: qty 3.6, 30d supply, fill #2

## 2020-07-28 MED ORDER — AMOXICILLIN 500 MG PO CAPS
2000.0000 mg | ORAL_CAPSULE | ORAL | 0 refills | Status: DC
  Filled 2020-07-28: qty 8, 2d supply, fill #0

## 2020-07-28 NOTE — Progress Notes (Signed)
   S: Patient presents today for review of their specialty medication.   Patient is currently taking Actemra for rheumatoid arthritis. Patient is managed by [name] for this.   Adherence: confirms.   Efficacy: she was recently increased to once weekly dosing and this is doing a good job for her.  Dosing:  SubQ: 162 mg once every other week; increase to 162 mg once every week based on clinical response. Pt is currently taking 162 mg once weekly.   Dosing adjustments: Renal impairment CrCl <30 mL/minute: no adjustments provided in package insert - has not been studied Hepatic impairment: avoid initiation and adjust dose as needed (see package insert) for hepatoxicity during treatment  Monitoring: S/sx of renal/hepatotoxicity: none S/sx of infection: none S/sx of hypersensitivity: none   Other adverse effects: none    O:  Lab Results  Component Value Date   WBC 6.5 04/14/2020   HGB 10.7 (L) 04/14/2020   HCT 35.3 (L) 04/14/2020   MCV 93.4 04/14/2020   PLT 338 04/14/2020      Chemistry      Component Value Date/Time   NA 137 04/14/2020 1134   NA 139 04/30/2019 0000   K 4.0 04/14/2020 1134   CL 109 04/14/2020 1134   CO2 21 (L) 04/14/2020 1134   BUN 18 04/14/2020 1134   BUN 12 07/30/2019 0000   CREATININE 0.72 04/14/2020 1134   CREATININE 0.63 11/23/2019 0841   GLU 70 07/30/2019 0000      Component Value Date/Time   CALCIUM 9.0 04/14/2020 1134   ALKPHOS 55 04/14/2020 1134   AST 19 04/14/2020 1134   AST 14 (L) 11/23/2019 0841   ALT 13 04/14/2020 1134   ALT 11 11/23/2019 0841   BILITOT 0.4 04/14/2020 1134   BILITOT 0.2 (L) 11/23/2019 0841       A/P: 1. Medication review: Patient currently taking Actemra for rheumatoid arthritis. She is tolerating it well. Since increasing to once weeklly she is experiencing good symptom control. Reviewed the medication with the patient, including the following: Actemra is a IL-6 antagonist used in the treatment of  rheumatoid arthritis. Patient should allow medication to reach room temperature prior to administration. Do not use if particulate matter or discoloration is visible. Rotate injection sites and avoid injecting into moles, scars, or damaged skin. Possible adverse effects include increased cholesterol, hepatotoxicity, injection site reactions, and infusion-related reactions (IV). GI perforation, hematologic effects, hypersensitivity reactions, increased risk of infections and possible increased risk of malignancy. Live vaccinations should be avoided and patient should notify physician if he or she has an infection as Actemra might need to be held. No recommendations for any changes.   Benard Halsted, PharmD, [facility], [place] [phone]

## 2020-07-29 DIAGNOSIS — S32009K Unspecified fracture of unspecified lumbar vertebra, subsequent encounter for fracture with nonunion: Secondary | ICD-10-CM | POA: Diagnosis not present

## 2020-07-29 DIAGNOSIS — M4316 Spondylolisthesis, lumbar region: Secondary | ICD-10-CM | POA: Diagnosis not present

## 2020-08-04 ENCOUNTER — Other Ambulatory Visit (HOSPITAL_COMMUNITY): Payer: Self-pay

## 2020-08-04 NOTE — Progress Notes (Signed)
Sent message, via epic in basket, requesting orders in epic from surgeon.  

## 2020-08-05 ENCOUNTER — Other Ambulatory Visit (HOSPITAL_COMMUNITY): Payer: Self-pay

## 2020-08-12 NOTE — Progress Notes (Signed)
DUE TO COVID-19 ONLY ONE VISITOR IS ALLOWED TO COME WITH YOU AND STAY IN THE WAITING ROOM ONLY DURING PRE OP AND PROCEDURE DAY OF SURGERY.  2 VISITOR  MAY VISIT WITH YOU AFTER SURGERY IN YOUR PRIVATE ROOM DURING VISITING HOURS ONLY!  YOU NEED TO HAVE A COVID 19 TEST ON__ 08/21/2020 ____'@_'$  '@_from'$  8am-3pm _____, THIS TEST MUST BE DONE BEFORE SURGERY,  Covid test is done at [place], [place] [address].  This is a drive thru.  No appt required. Please see map.                 Your procedure is scheduled on:          08/25/2020   Report to [facility] Main  Entrance   Report to admitting at     1115AM     Call this number if you have problems the morning of surgery [phone]    REMEMBER: NO  SOLID FOOD CANDY OR GUM AFTER MIDNIGHT. CLEAR LIQUIDS UNTIL   1045am       . NOTHING BY MOUTH EXCEPT CLEAR LIQUIDS UNTIL    1045am    . PLEASE FINISH ENSURE DRINK PER SURGEON ORDER  WHICH NEEDS TO BE COMPLETED AT   1045 am    .      CLEAR LIQUID DIET   Foods Allowed                                                                    Coffee and tea, regular and decaf                            Fruit ices (not with fruit pulp)                                      Iced Popsicles                                    Carbonated beverages, regular and diet                                    Cranberry, grape and apple juices Sports drinks like Gatorade Lightly seasoned clear broth or consume(fat free) Sugar, honey syrup ___________________________________________________________________      BRUSH YOUR TEETH MORNING OF SURGERY AND RINSE YOUR MOUTH OUT, NO CHEWING GUM CANDY OR MINTS.     Take these medicines the morning of surgery with A SIP OF WATER:  protonix   DO NOT TAKE ANY DIABETIC MEDICATIONS DAY OF YOUR SURGERY                               You may not have any metal on your body including hair pins and              piercings  Do not wear jewelry, make-up, lotions,  powders or perfumes, deodorant  Do not wear nail polish on your fingernails.  Do not shave  48 hours prior to surgery.              Men may shave face and neck.   Do not bring valuables to the hospital. [place].  Contacts, dentures or bridgework may not be worn into surgery.  Leave suitcase in the car. After surgery it may be brought to your room.     Patients discharged the day of surgery will not be allowed to drive home. IF YOU ARE HAVING SURGERY AND GOING HOME THE SAME DAY, YOU MUST HAVE AN ADULT TO DRIVE YOU HOME AND BE WITH YOU FOR 24 HOURS. YOU MAY GO HOME BY TAXI OR UBER OR ORTHERWISE, BUT AN ADULT MUST ACCOMPANY YOU HOME AND STAY WITH YOU FOR 24 HOURS.  Name and phone number of your driver:  Special Instructions: N/A              Please read over the following fact sheets you were given: _____________________________________________________________________  [facility] - Preparing for Surgery Before surgery, you can play an important role.  Because skin is not sterile, your skin needs to be as free of germs as possible.  You can reduce the number of germs on your skin by washing with CHG (chlorahexidine gluconate) soap before surgery.  CHG is an antiseptic cleaner which kills germs and bonds with the skin to continue killing germs even after washing. Please DO NOT use if you have an allergy to CHG or antibacterial soaps.  If your skin becomes reddened/irritated stop using the CHG and inform your nurse when you arrive at Short Stay. Do not shave (including legs and underarms) for at least 48 hours prior to the first CHG shower.  You may shave your face/neck. Please follow these instructions carefully:  1.  Shower with CHG Soap the night before surgery and the  morning of Surgery.  2.  If you choose to wash your hair, wash your hair first as usual with your  normal  shampoo.  3.  After you shampoo, rinse your hair and body  thoroughly to remove the  shampoo.                           4.  Use CHG as you would any other liquid soap.  You can apply chg directly  to the skin and wash                       Gently with a scrungie or clean washcloth.  5.  Apply the CHG Soap to your body ONLY FROM THE NECK DOWN.   Do not use on face/ open                           Wound or open sores. Avoid contact with eyes, ears mouth and genitals (private parts).                       Wash face,  Genitals (private parts) with your normal soap.             6.  Wash thoroughly, paying special attention to the area where your surgery  will be performed.  7.  Thoroughly rinse your body with  warm water from the neck down.  8.  DO NOT shower/wash with your normal soap after using and rinsing off  the CHG Soap.                9.  Pat yourself dry with a clean towel.            10.  Wear clean pajamas.            11.  Place clean sheets on your bed the night of your first shower and do not  sleep with pets. Day of Surgery : Do not apply any lotions/deodorants the morning of surgery.  Please wear clean clothes to the hospital/surgery center.  FAILURE TO FOLLOW THESE INSTRUCTIONS MAY RESULT IN THE CANCELLATION OF YOUR SURGERY PATIENT SIGNATURE_________________________________  NURSE SIGNATURE__________________________________  ________________________________________________________________________

## 2020-08-14 ENCOUNTER — Other Ambulatory Visit: Payer: Self-pay

## 2020-08-14 ENCOUNTER — Encounter (HOSPITAL_COMMUNITY)
Admission: RE | Admit: 2020-08-14 | Discharge: 2020-08-14 | Disposition: A | Discharge status: Home / Self Care | Payer: 59 | Source: Ambulatory Visit | Attending: Orthopedic Surgery | Admitting: Orthopedic Surgery

## 2020-08-14 ENCOUNTER — Encounter (HOSPITAL_COMMUNITY): Payer: Self-pay

## 2020-08-14 DIAGNOSIS — Z01812 Encounter for preprocedural laboratory examination: Secondary | ICD-10-CM | POA: Diagnosis not present

## 2020-08-14 LAB — COMPREHENSIVE METABOLIC PANEL
ALT: 15 U/L (ref 0–44)
AST: 20 U/L (ref 15–41)
Albumin: 4.2 g/dL (ref 3.5–5.0)
Alkaline Phosphatase: 63 U/L (ref 38–126)
Anion gap: 6 (ref 5–15)
BUN: 7 mg/dL (ref 6–20)
CO2: 24 mmol/L (ref 22–32)
Calcium: 8.7 mg/dL — ABNORMAL LOW (ref 8.9–10.3)
Chloride: 110 mmol/L (ref 98–111)
Creatinine, Ser: 0.53 mg/dL (ref 0.44–1.00)
GFR, Estimated: >60 mL/min (ref >60)
Glucose, Bld: 88 mg/dL (ref 70–99)
Potassium: 4 mmol/L (ref 3.5–5.1)
Sodium: 140 mmol/L (ref 135–145)
Total Bilirubin: 0.4 mg/dL (ref 0.3–1.2)
Total Protein: 6.4 g/dL — ABNORMAL LOW (ref 6.5–8.1)

## 2020-08-14 LAB — PROTIME-INR
INR: 1.1 (ref 0.8–1.2)
Prothrombin Time: 13.8 seconds (ref 11.4–15.2)

## 2020-08-14 LAB — CBC
HCT: 35.5 % — ABNORMAL LOW (ref 36.0–46.0)
Hemoglobin: 10.7 g/dL — ABNORMAL LOW (ref 12.0–15.0)
MCH: 28.3 pg (ref 26.0–34.0)
MCHC: 30.1 g/dL (ref 30.0–36.0)
MCV: 93.9 fL (ref 80.0–100.0)
Platelets: 310 10*3/uL (ref 150–400)
RBC: 3.78 MIL/uL — ABNORMAL LOW (ref 3.87–5.11)
RDW: 15.9 % — ABNORMAL HIGH (ref 11.5–15.5)
WBC: 4.8 10*3/uL (ref 4.0–10.5)
nRBC: 0.4 % — ABNORMAL HIGH (ref 0.0–0.2)

## 2020-08-14 LAB — SURGICAL PCR SCREEN
MRSA, PCR: NEGATIVE
Staphylococcus aureus: NEGATIVE

## 2020-08-14 NOTE — Progress Notes (Addendum)
Anesthesia Review:  PCP:DR Billey Chang  Cardiologist : none  Chest x-ray : EKG : Echo : Stress test: Cardiac Cath :  Activity level: can do a flight of stairs without difficulty  Sleep Study/ CPAP : none  Fasting Blood Sugar :      / Checks Blood Sugar -- times a day:   Blood Thinner/ Instructions /Last Dose: ASA / Instructions/ Last Dose :   CBC done 08/14/20 rotued to Dr Wynelle Link.

## 2020-08-21 ENCOUNTER — Other Ambulatory Visit: Payer: Self-pay | Admitting: Orthopedic Surgery

## 2020-08-21 ENCOUNTER — Other Ambulatory Visit (HOSPITAL_COMMUNITY): Payer: Self-pay

## 2020-08-21 NOTE — Progress Notes (Signed)
PT called and LVMM stating time had changed for surgery.  Called pt back and spoke with pt .  PT to arrive at 1010am on 08/25/20 for 1240 surgery and pt aware to have drink and clear liquids completed by 1010am.

## 2020-08-22 ENCOUNTER — Other Ambulatory Visit (HOSPITAL_COMMUNITY): Payer: Self-pay

## 2020-08-22 LAB — SARS CORONAVIRUS 2 (TAT 6-24 HRS): SARS Coronavirus 2: NEGATIVE

## 2020-08-24 MED ORDER — BUPIVACAINE LIPOSOME 1.3 % IJ SUSP
20.0000 mL | Freq: Once | INTRAMUSCULAR | Status: DC
  Filled 2020-08-24: qty 20

## 2020-08-24 NOTE — H&P (Signed)
TOTAL KNEE ADMISSION H&P  Patient is being admitted for right total knee arthroplasty.  Subjective:  Chief Complaint: Right knee pain.  HPI: Anne Garrett, 45 y.o. female has a history of pain and functional disability in the right knee due to arthritis and has failed non-surgical conservative treatments for greater than 12 weeks to include NSAID's and/or analgesics and activity modification. Onset of symptoms was gradual, starting  several  years ago with gradually worsening course since that time. The patient noted previous ORIF of right femur.  Patient currently rates pain in the right knee at 7 out of 10 with activity. Patient has worsening of pain with activity and weight bearing, pain that interferes with activities of daily living, and pain with passive range of motion. Patient has evidence of periarticular osteophytes and joint space narrowing by imaging studies. This patient has had distal femur fracture. There is no active infection.  Patient Active Problem List   Diagnosis Date Noted   OA (osteoarthritis) of knee 04/21/2020   Status post evacuation of hematoma 11/13/2019   Juvenile rheumatoid arthritis ([place]) 05/13/2019   Family history of breast cancer 05/13/2019   GERD (gastroesophageal reflux disease) 05/13/2019   Migraines 05/13/2019   Immunosuppression due to chronic steroid use ([place]) 05/13/2019   Rosacea 05/13/2019   S/P reverse total shoulder arthroplasty, left 09/07/2018   Lumbar pseudoarthrosis 06/04/2016   Lumbar stenosis 04/08/2016   S/P cervical spinal fusion 07/13/2015   Cervical stenosis of spinal canal 07/12/2015   HNP (herniated nucleus pulposus), cervical 06/19/2015   VAIN I (vaginal intraepithelial neoplasia grade I) 08/24/2013   H/O Clostridium difficile infection 01/16/2013   Hemangioma 01/16/2013   Secondary adrenal insufficiency ([place]) 01/16/2013   Osteoporosis 12/28/2012    Past Medical History:  Diagnosis Date   Adrenal insufficiency ([place])     secondary to Prednisone   Eczema    coner oif eye lid   Headache    migraines   Intervertebral disk disease    Juvenile idiopathic arthritis ([place])    LGSIL (low grade squamous intraepithelial dysplasia) 08/20/2013   Formatting of this note might be different from the original. On pap smear June 2015.   HR HPV positive.  Colposcopy and cervical AND vaginal biopsies done Aug 2015   Osteoporosis    Rheumatoid arthritis ([place])    Juvenile onset    Past Surgical History:  Procedure Laterality Date   ANKLE ARTHROPLASTY Right    ANTERIOR CERVICAL DECOMP/DISCECTOMY FUSION N/A 06/19/2015   Procedure: C3-4 C4-5 Anterior cervical decompression/diskectomy/fusion;  Surgeon: Jovita Gamma, MD;  Location: [facility];  Service: Neurosurgery;  Laterality: N/A;  C3-4 C4-5 Anterior cervical decompression/diskectomy/fusion   APPENDECTOMY     CARPAL TUNNEL RELEASE Right    CESAREAN SECTION     CHOLECYSTECTOMY     CHOLECYSTECTOMY  2006   COLONOSCOPY     ESSURE TUBAL LIGATION     Essure Implant-Permanent birth control   excision to palm Left    excision of mass to right plam   FOOT ARTHRODESIS Left 03/01/2019   Procedure: Left talonavicular and naviculocuneiform arthrodesis;  Surgeon: [name], MD;  Location: [facility];  Service: Orthopedics;  Laterality: Left;   HARDWARE REMOVAL Right 02/02/2017   Procedure: RIGHT WRIST DEEP IMPLANT REMOVAL, RIGHT WRIST AND HAND EXTENSOR TENDON RECONSTRUCTION, TENDON TRANSFER AND RECONSTRUCTION AS INDICATED;  Surgeon: Iran Planas, MD;  Location: [place];  Service: Orthopedics;  Laterality: Right;   IRRIGATION AND DEBRIDEMENT SHOULDER Right 11/13/2019   Procedure:  IRRIGATION AND DEBRIDEMENT RIGHT SHOULDER WITH WOUND CLOSURE;  Surgeon: [name], MD;  Location: [place];  Service: Orthopedics;  Laterality: Right;  64mn   JOINT REPLACEMENT Bilateral    bilat hip replacements   ORIF FEMUR FRACTURE Right    OTHER SURGICAL HISTORY  06/04/2016    Revision of PLIF   plif l5-s1 arthrodeisis    2018   POSTERIOR CERVICAL FUSION/FORAMINOTOMY N/A 07/12/2015   Procedure: C2 to C5 Cervical laminectomy, C2 to C5 posterior cervical arthrodesis with instrumentation and bone graft;  Surgeon: RJovita Gamma MD;  [facility];  Service: Neurosurgery;  Laterality: N/A;  C2 to C5 Cervical laminectomy, C2  to C5 posterior cervical arthrodesis with instrumentation and bone graft   REPAIR EXTENSOR TENDON Right 02/02/2017   Procedure: REPAIR EXTENSOR TENDON;  Surgeon: OIran Planas MD;  Location[place] [facility]  Service: Orthopedics;  Laterality: Right;   REVERSE SHOULDER ARTHROPLASTY Left 09/07/2018   Procedure: REVERSE SHOULDER ARTHROPLASTY;  Surgeon: SJustice Britain MD;  [facility];  Service: Orthopedics;  Laterality: Left;   REVERSE SHOULDER ARTHROPLASTY Right 09/20/2019   Procedure: REVERSE SHOULDER ARTHROPLASTY;  Surgeon: SJustice Britain MD;  [facility];  Service: Orthopedics;  Laterality: Right;  1277m   REVISION TOTAL SHOULDER TO REVERSE TOTAL SHOULDER Right 11/01/2019   Procedure: revision right total shoulder to reverse total shoulder;  Surgeon: SuJustice BritainMD;  [facility];  Service: Orthopedics;  Laterality: Right;  exparel block   right foot surgery Right    right jfoot arthrodesis and right ankle arthroplasty   TOTAL KNEE ARTHROPLASTY Left 04/21/2020   Procedure: TOTAL KNEE ARTHROPLASTY;  Surgeon: AlGaynelle ArabianMD;  [facility];  Service: Orthopedics;  Laterality: Left;  50100m  WRIST SURGERY Bilateral    arthrodesis    Prior to Admission medications   Medication Sig Start Date End Date Taking? Authorizing Provider  amoxicillin (AMOXIL) 500 MG capsule Take 4 capsules (2,000 mg total) by mouth 1 hour before dental appointment. Patient taking differently: Take 2,000 mg by mouth See admin instructions. Take 2000 mg 1 hour prior to dental work 07/28/20  Yes   BIOTIN PO Take 1 tablet by mouth daily.   Yes [provider]  butalbital-acetaminophen-caffeine (FIORICET) 50-325-40 MG tablet Take 1 tablet by mouth every 6 (six) hours as needed for headache. 06/25/20  Yes AndLeamon ArntD  Calcium Carb-Cholecalciferol (CALCIUM+D3 PO) Take 1 tablet by mouth daily.   Yes [provider]  Cholecalciferol (VITAMIN D3) 50 MCG (2000 UT) TABS Take 2,000 Units by mouth daily.   Yes [provider]  lactobacillus acidophilus & bulgar (LACTINEX) chewable tablet Chew 1-2 tablets by mouth daily.   Yes [provider]  nystatin (MYCOSTATIN) 100000 UNIT/ML suspension SWISH AND SPIT 5MLS BY MOUTH 4 TIMES DAILY AS DIRECTED Patient taking differently: Take 5 mLs by mouth 4 (four) times daily as needed (thrush). 02/15/20 02/14/21 Yes AndLeamon ArntD  OVER THE COUNTER MEDICATION Take 2 capsules by mouth daily. Juice plus Omega   Yes [provider]  OVER THE COUNTER MEDICATION Take 2 capsules by mouth daily. Juice plus Vegetable   Yes [provider]  pantoprazole (PROTONIX) 20 MG tablet Take 1 tablet (20 mg total) by mouth daily. 05/26/20  Yes AndLeamon ArntD  predniSONE (DELTASONE) 5 MG tablet Take 1-2 tablets (5-10 mg total) by mouth daily. Patient taking differently: Take 5 mg by mouth at bedtime. 04/22/20  Yes   PRESCRIPTION MEDICATION  Apply 1 application topically See admin instructions. Apply dime-size amount to face 1-2 TIMES per day as needed rosacea   Metronidazole 1% + Ivermectin 1% + Azelaic Acid 15%   Yes [provider]  Tocilizumab (ACTEMRA ACTPEN) 162 MG/0.9ML SOAJ as directed Subcutaneous weekly 30 days Patient taking differently: Inject 162 mg as directed every Tuesday. 07/28/20  Yes Tresa Garter, MD  traMADol (ULTRAM) 50 MG tablet Take 1-2 tablets (50-100 mg total) by mouth every 8 (eight) hours as needed for pain Patient taking differently: Take 50-100 mg by mouth See admin instructions. Take 100 mg daily, may take a second 50 mg dose in  the afternoon as needed pain 06/26/20  Yes   triamcinolone cream (KENALOG) 0.1 % Apply 1 application topically 2 (two) times daily as needed. Patient taking differently: Apply 1 application topically 2 (two) times daily as needed (eczema). 05/11/19  Yes [name], MD  gabapentin (NEURONTIN) 300 MG capsule Take 1 capsule three times a day for two weeks following surgery.Then take 1 capsule two times a day for two weeks. Then take 1 capsule once a day for two weeks. Then discontinue. Patient not taking: No sig reported 04/21/20   Edmisten, Kristie L, PA  leflunomide (ARAVA) 10 MG tablet Take 1 tablet (10 mg total) by mouth daily. Patient not taking: No sig reported 06/03/20     methocarbamol (ROBAXIN) 500 MG tablet Take 1 tablet (500 mg total) by mouth every 6 (six) hours as needed for muscle spasms. Patient not taking: No sig reported 04/21/20   Theresa Duty L, PA  oxyCODONE (OXY IR/ROXICODONE) 5 MG immediate release tablet Take 1 to 2 tablets by mouth every 8 hours as needed for severe pain Patient not taking: No sig reported 04/30/20       Allergies  Allergen Reactions   Sulfa Antibiotics Itching and Rash    Head to toe   Teriparatide Other (See Comments)    Severe Muscle Pain, [place]   Valproic Acid And Related Rash    Head to toe   Cyclobenzaprine Swelling    Left foot swelling   Omeprazole Diarrhea    Severe diarrhea    Plaquenil [Hydroxychloroquine] Diarrhea   Celexa [Citalopram] Diarrhea        Diflucan [Fluconazole] Rash    Fixed drug eruption   Divalproex Sodium Rash   Leflunomide     Neuropathy in both feet   Paroxetine Hcl Diarrhea   Zoloft [Sertraline] Diarrhea    Social History   Socioeconomic History   Marital status: Single    Spouse name: Not on file   Number of children: 2   Years of education: 12   Highest education level: Not on file  Occupational History   Occupation: Compliance Furniture conservator/restorer: [employer]  Tobacco Use   Smoking  status: Former    Packs/day: 0.25    Years: 20.00    Pack years: 5.00    Types: Cigarettes    Quit date: 07/02/2015    Years since quitting: 5.1   Smokeless tobacco: Never  Vaping Use   Vaping Use: Never used  Substance and Sexual Activity   Alcohol use: Yes    Alcohol/week: 2.0 standard drinks    Types: 2 Glasses of wine per week    Comment: social   Drug use: No   Sexual activity: Yes    Birth control/protection: Other-see comments    Comment: essure coils  Other Topics Concern   Not  on file  Social History Narrative   Lives at home w/ 2 cats and child every other weekend   Significant other - Chris   Right-hand   Drinks about 2 cups of coffee or soda per day   Social Determinants of Radio broadcast assistant Strain: Not on file  Food Insecurity: Not on file  Transportation Needs: Not on file  Physical Activity: Not on file  Stress: Not on file  Social Connections: Not on file  Intimate Partner Violence: Not on file    Tobacco Use: Medium Risk   Smoking Tobacco Use: Former   Smokeless Tobacco Use: Never   Social History   Substance and Sexual Activity  Alcohol Use Yes   Alcohol/week: 2.0 standard drinks   Types: 2 Glasses of wine per week   Comment: social    Family History  Problem Relation Age of Onset   Breast cancer Mother 59   Nephritis Father    Epilepsy Father    Breast cancer Maternal Aunt        2 mat aunts    Multiple sclerosis Maternal Uncle    Psoriasis Paternal Aunt    Diabetes Daughter     ROS: Constitutional: no fever, no chills, no night sweats, no significant weight loss Cardiovascular: no chest pain, no palpitations Respiratory: no cough, no shortness of breath, No COPD Gastrointestinal: no vomiting, no nausea Musculoskeletal: no swelling in Joints, Joint Pain Neurologic: no numbness, no tingling, no difficulty with balance   Objective:  Physical Exam: Well nourished and well developed.  General: Alert and oriented x3,  cooperative and pleasant, no acute distress.  Head: normocephalic, atraumatic, neck supple.  Eyes: EOMI.  Respiratory: breath sounds clear in all fields, no wheezing, rales, or rhonchi. Cardiovascular: Regular rate and rhythm, no murmurs, gallops or rubs.  Abdomen: non-tender to palpation and soft, normoactive bowel sounds. Musculoskeletal:       Right Knee Exam:   Well-healed scar from previous ORIF.   No effusion present. No swelling present.   The range of motion is: 0 to 125 degrees.   Marked crepitus on range of motion of the knee.   Positive lateral greater than medial joint line tenderness.   The knee is stable.       Left knee exam:   Looks fantastic.   There is no swelling.   The patient has well-healed incisions, dry, no drainage and without tenderness.   The range of motion: 0 to 125 degrees.   The knee is stable and nontender.   Calves soft and nontender. Motor function intact in LE. Strength 5/5 LE bilaterally. Neuro: Distal pulses 2+. Sensation to light touch intact in LE.  Radiographs- AP and lateral of the bilateral knees dated 02/07/2020, preoperatively, The right knee is bone-on-bone, tricompartmental. She has a large plate from a previous periprosthetic fracture. The femur is well healed and in good alignment.   Vital signs in last 24 hours:    Imaging Review Radiographs- AP and lateral of the bilateral knees dated 02/07/2020, preoperatively, The right knee is bone-on-bone, tricompartmental. She has a large plate from a previous periprosthetic fracture. The femur is well healed and in good alignment.   Assessment/Plan:  End stage arthritis, right knee   The patient history, physical examination, clinical judgment of the provider and imaging studies are consistent with end stage degenerative joint disease of the right knee and total knee arthroplasty is deemed medically necessary. The treatment options including medical management, injection therapy  arthroscopy and arthroplasty were discussed at length. The risks and benefits of total knee arthroplasty were presented and reviewed. The risks due to aseptic loosening, infection, stiffness, patella tracking problems, thromboembolic complications and other imponderables were discussed. The patient acknowledged the explanation, agreed to proceed with the plan and consent was signed. Patient is being admitted for inpatient treatment for surgery, pain control, PT, OT, prophylactic antibiotics, VTE prophylaxis, progressive ambulation and ADLs and discharge planning. The patient is planning to be discharged  home .   Patient's anticipated LOS is less than 2 midnights, meeting these requirements: - Younger than 69 - Lives within 1 hour of care - Has a competent adult at home to recover with post-op recover - NO history of  - Chronic pain requiring opiods  - Diabetes  - Coronary Artery Disease  - Heart failure  - Heart attack  - Stroke  - DVT/VTE  - Cardiac arrhythmia  - Respiratory Failure/COPD  - Renal failure  - Anemia  - Advanced Liver disease    Therapy Plans: [facility] Disposition: Home with Rometta Emery Planned DVT Prophylaxis: Aspirin '325mg'$  DME Needed: None PCP: Clearance Received TXA: IV Allergies: Sulfa, Leflunomide, SSRI (diarrhea), Fluconazole (rash), Omeprazole (Diarrhea) Anesthesia Concerns: None BMI: 24.2 Last HgbA1c: N/A  [facility]  Other: Currently taking Tramadol for back pain  - Patient was instructed on what medications to stop prior to surgery. - Follow-up visit in 2 weeks with Dr. Wynelle Link - Begin physical therapy following surgery - Pre-operative lab work as pre-surgical testing - Prescriptions will be provided in hospital at time of discharge  Fenton Foy, [facility], PA-C Orthopedic Surgery [facility] [place]

## 2020-08-25 ENCOUNTER — Other Ambulatory Visit: Payer: Self-pay

## 2020-08-25 ENCOUNTER — Observation Stay (HOSPITAL_COMMUNITY)
Admission: RE | Admit: 2020-08-25 | Discharge: 2020-08-26 | Disposition: A | Discharge status: Home / Self Care | Payer: 59 | Attending: Orthopedic Surgery | Admitting: Orthopedic Surgery

## 2020-08-25 ENCOUNTER — Ambulatory Visit (HOSPITAL_COMMUNITY): Payer: 59 | Admitting: Certified Registered Nurse Anesthetist

## 2020-08-25 ENCOUNTER — Encounter (HOSPITAL_COMMUNITY): Payer: Self-pay | Admitting: Orthopedic Surgery

## 2020-08-25 ENCOUNTER — Encounter (HOSPITAL_COMMUNITY)
Admission: RE | Disposition: A | Discharge status: Home / Self Care | Payer: Self-pay | Source: Home / Self Care | Attending: Orthopedic Surgery

## 2020-08-25 DIAGNOSIS — M06861 Other specified rheumatoid arthritis, right knee: Principal | ICD-10-CM | POA: Insufficient documentation

## 2020-08-25 DIAGNOSIS — K219 Gastro-esophageal reflux disease without esophagitis: Secondary | ICD-10-CM | POA: Diagnosis not present

## 2020-08-25 DIAGNOSIS — Z96612 Presence of left artificial shoulder joint: Secondary | ICD-10-CM | POA: Diagnosis not present

## 2020-08-25 DIAGNOSIS — Z87891 Personal history of nicotine dependence: Secondary | ICD-10-CM | POA: Insufficient documentation

## 2020-08-25 DIAGNOSIS — G43909 Migraine, unspecified, not intractable, without status migrainosus: Secondary | ICD-10-CM | POA: Diagnosis not present

## 2020-08-25 DIAGNOSIS — G8918 Other acute postprocedural pain: Secondary | ICD-10-CM | POA: Diagnosis not present

## 2020-08-25 DIAGNOSIS — M08061 Unspecified juvenile rheumatoid arthritis, right knee: Secondary | ICD-10-CM | POA: Diagnosis not present

## 2020-08-25 DIAGNOSIS — Z96661 Presence of right artificial ankle joint: Secondary | ICD-10-CM | POA: Diagnosis not present

## 2020-08-25 DIAGNOSIS — M1710 Unilateral primary osteoarthritis, unspecified knee: Secondary | ICD-10-CM

## 2020-08-25 DIAGNOSIS — M1711 Unilateral primary osteoarthritis, right knee: Secondary | ICD-10-CM | POA: Diagnosis present

## 2020-08-25 DIAGNOSIS — Z79899 Other long term (current) drug therapy: Secondary | ICD-10-CM | POA: Insufficient documentation

## 2020-08-25 DIAGNOSIS — M179 Osteoarthritis of knee, unspecified: Secondary | ICD-10-CM | POA: Diagnosis present

## 2020-08-25 DIAGNOSIS — T849XXA Unspecified complication of internal orthopedic prosthetic device, implant and graft, initial encounter: Secondary | ICD-10-CM | POA: Diagnosis not present

## 2020-08-25 DIAGNOSIS — Z96643 Presence of artificial hip joint, bilateral: Secondary | ICD-10-CM | POA: Insufficient documentation

## 2020-08-25 DIAGNOSIS — Z96652 Presence of left artificial knee joint: Secondary | ICD-10-CM | POA: Diagnosis not present

## 2020-08-25 HISTORY — PX: KNEE ARTHROPLASTY: SHX992

## 2020-08-25 SURGERY — ARTHROPLASTY, KNEE, TOTAL, USING IMAGELESS COMPUTER-ASSISTED NAVIGATION
Anesthesia: Monitor Anesthesia Care | Site: Knee | Laterality: Right

## 2020-08-25 MED ORDER — METHOCARBAMOL 500 MG IVPB - SIMPLE MED
500.0000 mg | Freq: Four times a day (QID) | INTRAVENOUS | Status: DC | PRN
  Filled 2020-08-25: qty 50

## 2020-08-25 MED ORDER — DOCUSATE SODIUM 100 MG PO CAPS
100.0000 mg | ORAL_CAPSULE | Freq: Two times a day (BID) | ORAL | Status: DC
  Administered 2020-08-25 – 2020-08-26 (×2): 100 mg via ORAL
  Filled 2020-08-25 (×2): qty 1

## 2020-08-25 MED ORDER — PANTOPRAZOLE SODIUM 20 MG PO TBEC
20.0000 mg | DELAYED_RELEASE_TABLET | Freq: Every day | ORAL | Status: DC
  Administered 2020-08-26: 20 mg via ORAL
  Filled 2020-08-25: qty 1

## 2020-08-25 MED ORDER — LACTATED RINGERS IV SOLN: INTRAVENOUS | Status: DC

## 2020-08-25 MED ORDER — STERILE WATER FOR IRRIGATION IR SOLN
Status: DC | PRN
  Administered 2020-08-25: 2000 mL

## 2020-08-25 MED ORDER — SODIUM CHLORIDE (PF) 0.9 % IJ SOLN
INTRAMUSCULAR | Status: DC | PRN
  Administered 2020-08-25: 60 mL

## 2020-08-25 MED ORDER — DEXAMETHASONE SODIUM PHOSPHATE 10 MG/ML IJ SOLN
INTRAMUSCULAR | Status: AC
  Filled 2020-08-25: qty 1

## 2020-08-25 MED ORDER — METOCLOPRAMIDE HCL 5 MG PO TABS: 5.0000 mg | ORAL_TABLET | Freq: Three times a day (TID) | ORAL | Status: DC | PRN

## 2020-08-25 MED ORDER — PREDNISONE 5 MG PO TABS: 5.0000 mg | ORAL_TABLET | Freq: Two times a day (BID) | ORAL | Status: DC

## 2020-08-25 MED ORDER — TRAMADOL HCL 50 MG PO TABS
50.0000 mg | ORAL_TABLET | Freq: Three times a day (TID) | ORAL | Status: DC | PRN
  Administered 2020-08-26: 100 mg via ORAL
  Filled 2020-08-25: qty 2

## 2020-08-25 MED ORDER — ONDANSETRON HCL 4 MG/2ML IJ SOLN
INTRAMUSCULAR | Status: DC | PRN
  Administered 2020-08-25: 4 mg via INTRAVENOUS

## 2020-08-25 MED ORDER — ONDANSETRON HCL 4 MG/2ML IJ SOLN: 4.0000 mg | Freq: Four times a day (QID) | INTRAMUSCULAR | Status: DC | PRN

## 2020-08-25 MED ORDER — EPHEDRINE SULFATE-NACL 50-0.9 MG/10ML-% IV SOSY
PREFILLED_SYRINGE | INTRAVENOUS | Status: DC | PRN
  Administered 2020-08-25: 5 mg via INTRAVENOUS
  Administered 2020-08-25: 10 mg via INTRAVENOUS
  Administered 2020-08-25: 5 mg via INTRAVENOUS

## 2020-08-25 MED ORDER — ASPIRIN EC 325 MG PO TBEC
325.0000 mg | DELAYED_RELEASE_TABLET | Freq: Two times a day (BID) | ORAL | Status: DC
  Administered 2020-08-26: 325 mg via ORAL
  Filled 2020-08-25: qty 1

## 2020-08-25 MED ORDER — PROMETHAZINE HCL 25 MG/ML IJ SOLN: 6.2500 mg | INTRAMUSCULAR | Status: DC | PRN

## 2020-08-25 MED ORDER — METOCLOPRAMIDE HCL 5 MG/ML IJ SOLN: 5.0000 mg | Freq: Three times a day (TID) | INTRAMUSCULAR | Status: DC | PRN

## 2020-08-25 MED ORDER — MIDAZOLAM HCL 2 MG/2ML IJ SOLN
1.0000 mg | INTRAMUSCULAR | Status: DC
  Administered 2020-08-25: 2 mg via INTRAVENOUS
  Filled 2020-08-25: qty 2

## 2020-08-25 MED ORDER — ACETAMINOPHEN 500 MG PO TABS: 1000.0000 mg | ORAL_TABLET | Freq: Once | ORAL | Status: DC

## 2020-08-25 MED ORDER — ONDANSETRON HCL 4 MG PO TABS: 4.0000 mg | ORAL_TABLET | Freq: Four times a day (QID) | ORAL | Status: DC | PRN

## 2020-08-25 MED ORDER — CEFAZOLIN SODIUM-DEXTROSE 2-4 GM/100ML-% IV SOLN
2.0000 g | INTRAVENOUS | Status: AC
  Administered 2020-08-25: 2 g via INTRAVENOUS
  Filled 2020-08-25: qty 100

## 2020-08-25 MED ORDER — PROPOFOL 500 MG/50ML IV EMUL
INTRAVENOUS | Status: DC | PRN
  Administered 2020-08-25: 110 ug/kg/min via INTRAVENOUS

## 2020-08-25 MED ORDER — ACETAMINOPHEN 10 MG/ML IV SOLN
1000.0000 mg | Freq: Four times a day (QID) | INTRAVENOUS | Status: DC
  Administered 2020-08-25: 1000 mg via INTRAVENOUS
  Filled 2020-08-25: qty 100

## 2020-08-25 MED ORDER — OXYCODONE HCL 5 MG PO TABS
5.0000 mg | ORAL_TABLET | ORAL | Status: DC | PRN
  Administered 2020-08-25 – 2020-08-26 (×4): 10 mg via ORAL
  Filled 2020-08-25 (×4): qty 2

## 2020-08-25 MED ORDER — DEXAMETHASONE SODIUM PHOSPHATE 10 MG/ML IJ SOLN
8.0000 mg | Freq: Once | INTRAMUSCULAR | Status: AC
  Administered 2020-08-25: 4 mg via INTRAVENOUS

## 2020-08-25 MED ORDER — MORPHINE SULFATE (PF) 2 MG/ML IV SOLN: 0.5000 mg | INTRAVENOUS | Status: DC | PRN

## 2020-08-25 MED ORDER — PREDNISONE 5 MG PO TABS
10.0000 mg | ORAL_TABLET | Freq: Two times a day (BID) | ORAL | Status: DC
  Administered 2020-08-25 – 2020-08-26 (×2): 10 mg via ORAL
  Filled 2020-08-25 (×3): qty 2

## 2020-08-25 MED ORDER — CEFAZOLIN SODIUM-DEXTROSE 2-4 GM/100ML-% IV SOLN
2.0000 g | Freq: Four times a day (QID) | INTRAVENOUS | Status: AC
  Administered 2020-08-25 – 2020-08-26 (×2): 2 g via INTRAVENOUS
  Filled 2020-08-25 (×2): qty 100

## 2020-08-25 MED ORDER — TRANEXAMIC ACID-NACL 1000-0.7 MG/100ML-% IV SOLN
1000.0000 mg | INTRAVENOUS | Status: AC
  Administered 2020-08-25: 1000 mg via INTRAVENOUS
  Filled 2020-08-25: qty 100

## 2020-08-25 MED ORDER — SODIUM CHLORIDE 0.9 % IR SOLN
Status: DC | PRN
  Administered 2020-08-25: 1000 mL

## 2020-08-25 MED ORDER — ACETAMINOPHEN 500 MG PO TABS
1000.0000 mg | ORAL_TABLET | Freq: Four times a day (QID) | ORAL | Status: DC
  Administered 2020-08-25 – 2020-08-26 (×3): 1000 mg via ORAL
  Filled 2020-08-25 (×3): qty 2

## 2020-08-25 MED ORDER — PROPOFOL 1000 MG/100ML IV EMUL
INTRAVENOUS | Status: AC
  Filled 2020-08-25: qty 100

## 2020-08-25 MED ORDER — BUPIVACAINE LIPOSOME 1.3 % IJ SUSP
INTRAMUSCULAR | Status: DC | PRN
  Administered 2020-08-25: 20 mL

## 2020-08-25 MED ORDER — BUPIVACAINE IN DEXTROSE 0.75-8.25 % IT SOLN
INTRATHECAL | Status: DC | PRN
  Administered 2020-08-25: 1.5 mL via INTRATHECAL

## 2020-08-25 MED ORDER — DIPHENHYDRAMINE HCL 12.5 MG/5ML PO ELIX
12.5000 mg | ORAL_SOLUTION | ORAL | Status: DC | PRN
  Administered 2020-08-26: 25 mg via ORAL
  Filled 2020-08-25: qty 10

## 2020-08-25 MED ORDER — POLYETHYLENE GLYCOL 3350 17 G PO PACK: 17.0000 g | PACK | Freq: Every day | ORAL | Status: DC | PRN

## 2020-08-25 MED ORDER — POVIDONE-IODINE 10 % EX SWAB
2.0000 "application " | Freq: Once | CUTANEOUS | Status: AC
  Administered 2020-08-25: 2 via TOPICAL

## 2020-08-25 MED ORDER — BISACODYL 10 MG RE SUPP: 10.0000 mg | Freq: Every day | RECTAL | Status: DC | PRN

## 2020-08-25 MED ORDER — METHOCARBAMOL 500 MG PO TABS
500.0000 mg | ORAL_TABLET | Freq: Four times a day (QID) | ORAL | Status: DC | PRN
  Administered 2020-08-25 – 2020-08-26 (×3): 500 mg via ORAL
  Filled 2020-08-25 (×3): qty 1

## 2020-08-25 MED ORDER — DEXAMETHASONE SODIUM PHOSPHATE 10 MG/ML IJ SOLN
INTRAMUSCULAR | Status: DC | PRN
  Administered 2020-08-25: 5 mg

## 2020-08-25 MED ORDER — MENTHOL 3 MG MT LOZG: 1.0000 | LOZENGE | OROMUCOSAL | Status: DC | PRN

## 2020-08-25 MED ORDER — PROPOFOL 10 MG/ML IV BOLUS
INTRAVENOUS | Status: DC | PRN
  Administered 2020-08-25: 20 mg via INTRAVENOUS

## 2020-08-25 MED ORDER — TRAMADOL HCL 50 MG PO TABS: 50.0000 mg | ORAL_TABLET | ORAL | Status: DC

## 2020-08-25 MED ORDER — ROPIVACAINE HCL 5 MG/ML IJ SOLN
INTRAMUSCULAR | Status: DC | PRN
  Administered 2020-08-25: 20 mL via PERINEURAL

## 2020-08-25 MED ORDER — 0.9 % SODIUM CHLORIDE (POUR BTL) OPTIME
TOPICAL | Status: DC | PRN
  Administered 2020-08-25: 1000 mL

## 2020-08-25 MED ORDER — SODIUM CHLORIDE (PF) 0.9 % IJ SOLN
INTRAMUSCULAR | Status: AC
  Filled 2020-08-25: qty 10

## 2020-08-25 MED ORDER — AMISULPRIDE (ANTIEMETIC) 5 MG/2ML IV SOLN
10.0000 mg | Freq: Once | INTRAVENOUS | Status: DC | PRN
Start: 2020-08-25 — End: 2020-08-25

## 2020-08-25 MED ORDER — FENTANYL CITRATE (PF) 100 MCG/2ML IJ SOLN
50.0000 ug | INTRAMUSCULAR | Status: DC
  Administered 2020-08-25 (×2): 50 ug via INTRAVENOUS
  Filled 2020-08-25: qty 2

## 2020-08-25 MED ORDER — SODIUM CHLORIDE 0.9 % IV SOLN: INTRAVENOUS | Status: DC

## 2020-08-25 MED ORDER — GABAPENTIN 300 MG PO CAPS
300.0000 mg | ORAL_CAPSULE | Freq: Three times a day (TID) | ORAL | Status: DC
  Administered 2020-08-25 – 2020-08-26 (×2): 300 mg via ORAL
  Filled 2020-08-25 (×2): qty 1

## 2020-08-25 MED ORDER — PHENOL 1.4 % MT LIQD: 1.0000 | OROMUCOSAL | Status: DC | PRN

## 2020-08-25 MED ORDER — FENTANYL CITRATE (PF) 100 MCG/2ML IJ SOLN: 25.0000 ug | INTRAMUSCULAR | Status: DC | PRN

## 2020-08-25 MED ORDER — ONDANSETRON HCL 4 MG/2ML IJ SOLN
INTRAMUSCULAR | Status: AC
  Filled 2020-08-25: qty 2

## 2020-08-25 MED ORDER — FLEET ENEMA 7-19 GM/118ML RE ENEM: 1.0000 | ENEMA | Freq: Once | RECTAL | Status: DC | PRN

## 2020-08-25 SURGICAL SUPPLY — 62 items
ATTUNE MED DOME PAT 32 KNEE (Knees) ×2 IMPLANT
ATTUNE PS FEM RT SZ 4 CEM KNEE (Femur) ×2 IMPLANT
ATTUNE PSRP INSR SZ4 6 KNEE (Insert) ×2 IMPLANT
BAG COUNTER SPONGE SURGICOUNT (BAG) ×2 IMPLANT
BAG ZIPLOCK 12X15 (MISCELLANEOUS) IMPLANT
BANDAGE ESMARK 6X9 LF (GAUZE/BANDAGES/DRESSINGS) ×1 IMPLANT
BASE TIBIAL ROT PLAT SZ 3 KNEE (Knees) ×1 IMPLANT
BATTERY INSTRU NAVIGATION (MISCELLANEOUS) ×6 IMPLANT
BLADE SAG 18X100X1.27 (BLADE) ×2 IMPLANT
BLADE SAW SGTL 11.0X1.19X90.0M (BLADE) ×2 IMPLANT
BNDG ELASTIC 6X5.8 VLCR STR LF (GAUZE/BANDAGES/DRESSINGS) ×2 IMPLANT
BNDG ESMARK 6X9 LF (GAUZE/BANDAGES/DRESSINGS) ×2
BOWL SMART MIX CTS (DISPOSABLE) ×2 IMPLANT
CEMENT HV SMART SET (Cement) ×4 IMPLANT
COVER SURGICAL LIGHT HANDLE (MISCELLANEOUS) ×2 IMPLANT
CUFF TOURN SGL QUICK 34 (TOURNIQUET CUFF) ×1
CUFF TRNQT CYL 34X4.125X (TOURNIQUET CUFF) ×1 IMPLANT
DRAPE POUCH INSTRU U-SHP 10X18 (DRAPES) ×2 IMPLANT
DRAPE SHEET LG 3/4 BI-LAMINATE (DRAPES) ×2 IMPLANT
DRAPE U-SHAPE 47X51 STRL (DRAPES) ×2 IMPLANT
DRSG ADAPTIC 3X8 NADH LF (GAUZE/BANDAGES/DRESSINGS) IMPLANT
DRSG AQUACEL AG ADV 3.5X10 (GAUZE/BANDAGES/DRESSINGS) ×2 IMPLANT
DRSG PAD ABDOMINAL 8X10 ST (GAUZE/BANDAGES/DRESSINGS) IMPLANT
DURAPREP 26ML APPLICATOR (WOUND CARE) ×2 IMPLANT
ELECT REM PT RETURN 15FT ADLT (MISCELLANEOUS) ×2 IMPLANT
EVACUATOR 1/8 PVC DRAIN (DRAIN) IMPLANT
FACESHIELD WRAPAROUND (MASK) ×8 IMPLANT
GAUZE SPONGE 4X4 12PLY STRL (GAUZE/BANDAGES/DRESSINGS) IMPLANT
GLOVE SRG 8 PF TXTR STRL LF DI (GLOVE) ×3 IMPLANT
GLOVE SURG ENC MOIS LTX SZ6.5 (GLOVE) ×4 IMPLANT
GLOVE SURG ENC MOIS LTX SZ7.5 (GLOVE) ×2 IMPLANT
GLOVE SURG ENC MOIS LTX SZ8 (GLOVE) ×4 IMPLANT
GLOVE SURG POLYISO LF SZ7 (GLOVE) ×2 IMPLANT
GLOVE SURG UNDER POLY LF SZ7 (GLOVE) ×4 IMPLANT
GLOVE SURG UNDER POLY LF SZ8 (GLOVE) ×3
GOWN STRL REUS W/TWL LRG LVL3 (GOWN DISPOSABLE) ×6 IMPLANT
HANDPIECE INTERPULSE COAX TIP (DISPOSABLE) ×1
IMMOBILIZER KNEE 20 (SOFTGOODS) ×2
IMMOBILIZER KNEE 20 THIGH 36 (SOFTGOODS) ×1 IMPLANT
KIT TURNOVER KIT A (KITS) ×2 IMPLANT
MANIFOLD NEPTUNE II (INSTRUMENTS) ×2 IMPLANT
NS IRRIG 1000ML POUR BTL (IV SOLUTION) ×2 IMPLANT
PADDING CAST COTTON 6X4 STRL (CAST SUPPLIES) ×2 IMPLANT
PENCIL SMOKE EVACUATOR (MISCELLANEOUS) IMPLANT
PIN DRILL FIX HALF THREAD (BIT) ×2 IMPLANT
PIN STEINMAN FIXATION KNEE (PIN) ×2 IMPLANT
PROTECTOR NERVE ULNAR (MISCELLANEOUS) ×2 IMPLANT
SET HNDPC FAN SPRY TIP SCT (DISPOSABLE) ×1 IMPLANT
SPONGE T-LAP 18X18 ~~LOC~~+RFID (SPONGE) ×4 IMPLANT
STRIP CLOSURE SKIN 1/2X4 (GAUZE/BANDAGES/DRESSINGS) ×4 IMPLANT
SUCTION FRAZIER HANDLE 12FR (TUBING) ×1
SUCTION TUBE FRAZIER 12FR DISP (TUBING) ×1 IMPLANT
SUT MNCRL AB 4-0 PS2 18 (SUTURE) ×2 IMPLANT
SUT PDS AB 1 CT1 27 (SUTURE) ×6 IMPLANT
SUT VIC AB 2-0 CT1 27 (SUTURE) ×3
SUT VIC AB 2-0 CT1 TAPERPNT 27 (SUTURE) ×3 IMPLANT
SUT VLOC 180 0 24IN GS25 (SUTURE) ×2 IMPLANT
TIBIAL BASE ROT PLAT SZ 3 KNEE (Knees) ×2 IMPLANT
TOWEL OR 17X26 10 PK STRL BLUE (TOWEL DISPOSABLE) ×4 IMPLANT
TRAY FOLEY MTR SLVR 14FR STAT (SET/KITS/TRAYS/PACK) ×2 IMPLANT
WATER STERILE IRR 1000ML POUR (IV SOLUTION) ×2 IMPLANT
WRAP KNEE MAXI GEL POST OP (GAUZE/BANDAGES/DRESSINGS) ×2 IMPLANT

## 2020-08-25 NOTE — Anesthesia Postprocedure Evaluation (Signed)
Anesthesia Post Note  Patient: Anne Garrett  Procedure(s) Performed: COMPUTER ASSISTED TOTAL KNEE ARTHROPLASTY (Right: Knee)     Patient location during evaluation: PACU Anesthesia Type: MAC Level of consciousness: awake and alert Pain management: pain level controlled Vital Signs Assessment: post-procedure vital signs reviewed and stable Respiratory status: spontaneous breathing and respiratory function stable Cardiovascular status: blood pressure returned to baseline and stable Postop Assessment: spinal receding Anesthetic complications: no   No notable events documented.  Last Vitals:  Vitals:   08/25/20 1530 08/25/20 1545  BP: 102/63 107/69  Pulse: 63 69  Resp: 15 17  Temp:    SpO2: 100% 100%    Last Pain:  Vitals:   08/25/20 1545  TempSrc:   PainSc: 0-No pain                 Valerie Fredin DANIEL

## 2020-08-25 NOTE — Anesthesia Procedure Notes (Signed)
Anesthesia Regional Block: Adductor canal block   Pre-Anesthetic Checklist: , timeout performed,  Correct Patient, Correct Site, Correct Laterality,  Correct Procedure, Correct Position, site marked,  Risks and benefits discussed,  Surgical consent,  Pre-op evaluation,  At surgeon's request and post-op pain management  Laterality: Right  Prep: chloraprep       Needles:  Injection technique: Single-shot  Needle Type: Stimulator Needle - 80     Needle Length: 10cm  Needle Gauge: 21     Additional Needles:   Narrative:  Start time: 08/25/2020 12:19 PM End time: 08/25/2020 12:29 PM Injection made incrementally with aspirations every 5 mL.  Performed by: Personally  Anesthesiologist: Duane Boston, MD

## 2020-08-25 NOTE — Progress Notes (Signed)
Orthopedic Tech Progress Note Patient Details:  Anne Garrett 12/02/1975 [id number]  CPM Right Knee CPM Right Knee: On Right Knee Flexion (Degrees): 40 Right Knee Extension (Degrees): 10  Post Interventions Patient Tolerated: Well Instructions Provided: Care of device, Adjustment of device  [name] 08/25/2020, 3:08 PM

## 2020-08-25 NOTE — Interval H&P Note (Signed)
History and Physical Interval Note:  08/25/2020 10:39 AM  Anne Garrett  has presented today for surgery, with the diagnosis of right knee osteoarthritis; retained hardware.  The various methods of treatment have been discussed with the patient and family. After consideration of risks, benefits and other options for treatment, the patient has consented to  Procedure(s): COMPUTER ASSISTED TOTAL KNEE ARTHROPLASTY (Right) as a surgical intervention.  The patient's history has been reviewed, patient examined, no change in status, stable for surgery.  I have reviewed the patient's chart and labs.  Questions were answered to the patient's satisfaction.     Anne Garrett

## 2020-08-25 NOTE — Op Note (Signed)
OPERATIVE REPORT-TOTAL KNEE ARTHROPLASTY   Pre-operative diagnosis- Rheumatoid arthritis  Right knee(s)  Post-operative diagnosis- Rheumatoid arthritis Right knee(s)  Procedure-  Right  Total Knee Arthroplasty with hardware removal  Surgeon- Anne Garrett. Anne Strother, MD  Assistant- Anne Duty, PA-C   Anesthesia-   Adductor canal block and spinal  EBL-50 mL   Drains None  Tourniquet time-  Total Tourniquet Time Documented: Thigh (Right) - 57 minutes Total: Thigh (Right) - 57 minutes     Complications- None  Condition-PACU - hemodynamically stable.   Brief Clinical Note  Anne Garrett is a 45 y.o. year old female with end stage RA of her right knee with progressively worsening pain and dysfunction. She has constant pain, with activity and at rest and significant functional deficits with difficulties even with ADLs. She has had extensive non-op management including analgesics, injections of cortisone and home exercise program, but remains in significant pain with significant dysfunction.She has a long supracondylar plate running from her lateral knee to lateral hip with 4 screws that potentially could be in the way for a knee replacement. Radiographs show bone on bone arthritis all 3 compartments. She presents now for right Total Knee Arthroplasty with hardware removal     Procedure in detail---   The patient is brought into the operating room and positioned supine on the operating table. After successful administration of  Adductor canal block and spinal,   a tourniquet is placed high on the  Right thigh(s) and the lower extremity is prepped and draped in the usual sterile fashion. Time out is performed by the operating team and then the  Right lower extremity is wrapped in Esmarch, knee flexed and the tourniquet inflated to 300 mmHg.       A midline incision is made with a ten blade through the subcutaneous tissue to the level of the extensor mechanism. A fresh blade is used  to make a medial parapatellar arthrotomy. Soft tissue over the proximal medial tibia is subperiosteally elevated to the joint line with a knife and into the semimembranosus bursa with a Cobb elevator. Soft tissue over the proximal lateral tibia is elevated with attention being paid to avoiding the patellar tendon on the tibial tubercle. The patella is everted, knee flexed 90 degrees and the ACL and PCL are removed. Findings are bone on bone all 3 compartments with large global osteophytes.I then identified the distal part of the plate laterally as well as the 4 distal locking screws. I removed the screws from the plate.         The sensor for the Stryker navigation system is placed on the distal femur and pinned to it. The sensor is then used to register the necessary anatomic landmarks of the femur and  the distal femoral cutting block is pinned to remove 9 mm off the distal femur. The cut is made in neutral varus/valgus and flexion/extension alignment. Resection is made with an oscillating saw.      The tibia is subluxed forward and the menisci are removed. The extramedullary alignment guide is placed referencing proximally at the medial aspect of the tibial tubercle and distally along the second metatarsal axis and tibial crest. The block is pinned to remove 21m off the more deficient lateral  side. Resection is made with an oscillating saw. Size 3is the most appropriate size for the tibia and the proximal tibia is prepared with the modular drill and keel punch for that size.      The femoral sizing guide  is placed and size 4 is most appropriate. Rotation is marked off the epicondylar axis and confirmed by creating a rectangular flexion gap at 90 degrees. The size 4 cutting block is pinned in this rotation and the anterior, posterior and chamfer cuts are made with the oscillating saw. The intercondylar block is then placed and that cut is made.      Trial size 3 tibial component, trial size 4 posterior  stabilized femur and a 6  mm posterior stabilized rotating platform insert trial is placed. Full extension is achieved with excellent varus/valgus and anterior/posterior balance throughout full range of motion. The patella is everted and thickness measured to be 21  mm. Free hand resection is taken to 12 mm, a 32 template is placed, lug holes are drilled, trial patella is placed, and it tracks normally. Osteophytes are removed off the posterior femur with the trial in place. All trials are removed and the cut bone surfaces prepared with pulsatile lavage. Cement is mixed and once ready for implantation, the size 3 tibial implant, size  4 posterior stabilized femoral component, and the size 32 patella are cemented in place and the patella is held with the clamp. The trial insert is placed and the knee held in full extension. The Exparel (20 ml mixed with 60 ml saline) is injected into the extensor mechanism, posterior capsule, medial and lateral gutters and subcutaneous tissues.  All extruded cement is removed and once the cement is hard the permanent 6 mm posterior stabilized rotating platform insert is placed into the tibial tray.      The wound is copiously irrigated with saline solution and the extensor mechanism closed with # 0 Stratofix suture. The tourniquet is released for a total tourniquet time of 51  minutes. Flexion against gravity is 120 degrees and the patella tracks normally. Subcutaneous tissue is closed with 2.0 vicryl and subcuticular with running 4.0 Monocryl. The incision is cleaned and dried and steri-strips and a bulky sterile dressing are applied. The limb is placed into a knee immobilizer and the patient is awakened and transported to recovery in stable condition.      Please note that a surgical assistant was a medical necessity for this procedure in order to perform it in a safe and expeditious manner. Surgical assistant was necessary to retract the ligaments and vital neurovascular  structures to prevent injury to them and also necessary for proper positioning of the limb to allow for anatomic placement of the prosthesis.   Anne Garrett Anne Conry, MD    08/25/2020, 2:20 PM

## 2020-08-25 NOTE — Discharge Instructions (Addendum)
[name], MD Total Joint Specialist [facility] [address], [address] [place], [place] [address] [phone]  TOTAL KNEE REPLACEMENT POSTOPERATIVE DIRECTIONS    Knee Rehabilitation, Guidelines Following Surgery  Results after knee surgery are often greatly improved when you follow the exercise, range of motion and muscle strengthening exercises prescribed by your doctor. Safety measures are also important to protect the knee from further injury. If any of these exercises cause you to have increased pain or swelling in your knee joint, decrease the amount until you are comfortable again and slowly increase them. If you have problems or questions, call your caregiver or physical therapist for advice.   BLOOD CLOT PREVENTION Take a 325 mg Aspirin two times a day for three weeks following surgery. Then take an 81 mg Aspirin once a day for three weeks. Then discontinue Aspirin. You may resume your vitamins/supplements upon discharge from the hospital. Do not take any NSAIDs (Advil, Aleve, Ibuprofen, Meloxicam, etc.) until you have discontinued the 325 mg Aspirin.  PREDNISONE INSTRUCTIONS Take a 5 mg prednisone twice a day on Wednesday (8/24). On Thursday, resume 5 mg prednisone daily.  HOME CARE INSTRUCTIONS  Remove items at home which could result in a fall. This includes throw rugs or furniture in walking pathways.  ICE to the affected knee as much as tolerated. Icing helps control swelling. If the swelling is well controlled you will be more comfortable and rehab easier. Continue to use ice on the knee for pain and swelling from surgery. You may notice swelling that will progress down to the foot and ankle. This is normal after surgery. Elevate the leg when you are not up walking on it.    Continue to use the breathing machine which will help keep your temperature down. It is common for your temperature to cycle up and down following surgery, especially at night  when you are not up moving around and exerting yourself. The breathing machine keeps your lungs expanded and your temperature down. Do not place pillow under the operative knee, focus on keeping the knee straight while resting  DIET You may resume your previous home diet once you are discharged from the hospital.  DRESSING / WOUND CARE / SHOWERING Keep your bulky bandage on for 2 days. On the third post-operative day you may remove the Ace bandage and gauze. There is a waterproof adhesive bandage on your skin which will stay in place until your first follow-up appointment. Once you remove this you will not need to place another bandage You may begin showering 3 days following surgery, but do not submerge the incision under water.  ACTIVITY For the first 5 days, the key is rest and control of pain and swelling Do your home exercises twice a day starting on post-operative day 3. On the days you go to physical therapy, just do the home exercises once that day. You should rest, ice and elevate the leg for 50 minutes out of every hour. Get up and walk/stretch for 10 minutes per hour. After 5 days you can increase your activity slowly as tolerated. Walk with your walker as instructed. Use the walker until you are comfortable transitioning to a cane. Walk with the cane in the opposite hand of the operative leg. You may discontinue the cane once you are comfortable and walking steadily. Avoid periods of inactivity such as sitting longer than an hour when not asleep. This helps prevent blood clots.  You may discontinue the knee immobilizer once  you are able to perform a straight leg raise while lying down. You may resume a sexual relationship in one month or when given the OK by your doctor.  You may return to work once you are cleared by your doctor.  Do not drive a car for 6 weeks or until released by your surgeon.  Do not drive while taking narcotics.  TED HOSE STOCKINGS Wear the elastic stockings  on both legs for three weeks following surgery during the day. You may remove them at night for sleeping.  WEIGHT BEARING Weight bearing as tolerated with assist device (walker, cane, etc) as directed, use it as long as suggested by your surgeon or therapist, typically at least 4-6 weeks.  POSTOPERATIVE CONSTIPATION PROTOCOL Constipation - defined medically as fewer than three stools per week and severe constipation as less than one stool per week.  One of the most common issues patients have following surgery is constipation.  Even if you have a regular bowel pattern at home, your normal regimen is likely to be disrupted due to multiple reasons following surgery.  Combination of anesthesia, postoperative narcotics, change in appetite and fluid intake all can affect your bowels.  In order to avoid complications following surgery, here are some recommendations in order to help you during your recovery period.  Colace (docusate) - Pick up an over-the-counter form of Colace or another stool softener and take twice a day as long as you are requiring postoperative pain medications.  Take with a full glass of water daily.  If you experience loose stools or diarrhea, hold the colace until you stool forms back up. If your symptoms do not get better within 1 week or if they get worse, check with your doctor. Dulcolax (bisacodyl) - Pick up over-the-counter and take as directed by the product packaging as needed to assist with the movement of your bowels.  Take with a full glass of water.  Use this product as needed if not relieved by Colace only.  MiraLax (polyethylene glycol) - Pick up over-the-counter to have on hand. MiraLax is a solution that will increase the amount of water in your bowels to assist with bowel movements.  Take as directed and can mix with a glass of water, juice, soda, coffee, or tea. Take if you go more than two days without a movement. Do not use MiraLax more than once per day. Call your  doctor if you are still constipated or irregular after using this medication for 7 days in a row.  If you continue to have problems with postoperative constipation, please contact the office for further assistance and recommendations.  If you experience "the worst abdominal pain ever" or develop nausea or vomiting, please contact the office immediatly for further recommendations for treatment.  ITCHING If you experience itching with your medications, try taking only a single pain pill, or even half a pain pill at a time.  You can also use Benadryl over the counter for itching or also to help with sleep.   MEDICATIONS See your medication summary on the "After Visit Summary" that the nursing staff will review with you prior to discharge.  You may have some home medications which will be placed on hold until you complete the course of blood thinner medication.  It is important for you to complete the blood thinner medication as prescribed by your surgeon.  Continue your approved medications as instructed at time of discharge.  PRECAUTIONS If you experience chest pain or shortness of breath -  call 911 immediately for transfer to the hospital emergency department.  If you develop a fever greater that 101 F, purulent drainage from wound, increased redness or drainage from wound, foul odor from the wound/dressing, or calf pain - CONTACT YOUR SURGEON.                                                   FOLLOW-UP APPOINTMENTS Make sure you keep all of your appointments after your operation with your surgeon and caregivers. You should call the office at the above phone number and make an appointment for approximately two weeks after the date of your surgery or on the date instructed by your surgeon outlined in the "After Visit Summary".  RANGE OF MOTION AND STRENGTHENING EXERCISES  Rehabilitation of the knee is important following a knee injury or an operation. After just a few days of immobilization, the  muscles of the thigh which control the knee become weakened and shrink (atrophy). Knee exercises are designed to build up the tone and strength of the thigh muscles and to improve knee motion. Often times heat used for twenty to thirty minutes before working out will loosen up your tissues and help with improving the range of motion but do not use heat for the first two weeks following surgery. These exercises can be done on a training (exercise) mat, on the floor, on a table or on a bed. Use what ever works the best and is most comfortable for you Knee exercises include:  Leg Lifts - While your knee is still immobilized in a splint or cast, you can do straight leg raises. Lift the leg to 60 degrees, hold for 3 sec, and slowly lower the leg. Repeat 10-20 times 2-3 times daily. Perform this exercise against resistance later as your knee gets better.  Quad and Hamstring Sets - Tighten up the muscle on the front of the thigh (Quad) and hold for 5-10 sec. Repeat this 10-20 times hourly. Hamstring sets are done by pushing the foot backward against an object and holding for 5-10 sec. Repeat as with quad sets.  Leg Slides: Lying on your back, slowly slide your foot toward your buttocks, bending your knee up off the floor (only go as far as is comfortable). Then slowly slide your foot back down until your leg is flat on the floor again. Angel Wings: Lying on your back spread your legs to the side as far apart as you can without causing discomfort.  A rehabilitation program following serious knee injuries can speed recovery and prevent re-injury in the future due to weakened muscles. Contact your doctor or a physical therapist for more information on knee rehabilitation.   POST-OPERATIVE OPIOID TAPER INSTRUCTIONS: It is important to wean off of your opioid medication as soon as possible. If you do not need pain medication after your surgery it is ok to stop day one. Opioids include: Codeine, Hydrocodone(Norco,  Vicodin), Oxycodone(Percocet, oxycontin) and hydromorphone amongst others.  Long term and even short term use of opiods can cause: Increased pain response Dependence Constipation Depression Respiratory depression And more.  Withdrawal symptoms can include Flu like symptoms Nausea, vomiting And more Techniques to manage these symptoms Hydrate well Eat regular healthy meals Stay active Use relaxation techniques(deep breathing, meditating, yoga) Do Not substitute Alcohol to help with tapering If you have been on opioids  for less than two weeks and do not have pain than it is ok to stop all together.  Plan to wean off of opioids This plan should start within one week post op of your joint replacement. Maintain the same interval or time between taking each dose and first decrease the dose.  Cut the total daily intake of opioids by one tablet each day Next start to increase the time between doses. The last dose that should be eliminated is the evening dose.   IF YOU ARE TRANSFERRED TO A SKILLED REHAB FACILITY If the patient is transferred to a skilled rehab facility following release from the hospital, a list of the current medications will be sent to the facility for the patient to continue.  When discharged from the skilled rehab facility, please have the facility set up the patient's [place] prior to being released. Also, the skilled facility will be responsible for providing the patient with their medications at time of release from the facility to include their pain medication, the muscle relaxants, and their blood thinner medication. If the patient is still at the rehab facility at time of the two week follow up appointment, the skilled rehab facility will also need to assist the patient in arranging follow up appointment in our office and any transportation needs.  MAKE SURE YOU:  Understand these instructions.  Get help right away if you are not doing well or get  worse.   DENTAL ANTIBIOTICS:  In most cases prophylactic antibiotics for Dental procdeures after total joint surgery are not necessary.  Exceptions are as follows:  1. History of prior total joint infection  2. Severely immunocompromised (Organ Transplant, cancer chemotherapy, Rheumatoid biologic meds such as [place])  3. Poorly controlled diabetes (A1C &gt; 8.0, blood glucose over 200)  If you have one of these conditions, contact your surgeon for an antibiotic prescription, prior to your dental procedure.    Pick up stool softner and laxative for home use following surgery while on pain medications. Do not submerge incision under water. Please use good hand washing techniques while changing dressing each day. May shower starting three days after surgery. Please use a clean towel to pat the incision dry following showers. Continue to use ice for pain and swelling after surgery. Do not use any lotions or creams on the incision until instructed by your surgeon.

## 2020-08-25 NOTE — Anesthesia Procedure Notes (Signed)
Procedure Name: MAC Date/Time: 08/25/2020 12:41 PM Performed by: West Pugh, CRNA Pre-anesthesia Checklist: Patient identified, Emergency Drugs available, Suction available, Patient being monitored and Timeout performed Patient Re-evaluated:Patient Re-evaluated prior to induction Oxygen Delivery Method: Simple face mask Preoxygenation: Pre-oxygenation with 100% oxygen Induction Type: IV induction Placement Confirmation: positive ETCO2 Dental Injury: Teeth and Oropharynx as per pre-operative assessment

## 2020-08-25 NOTE — Anesthesia Preprocedure Evaluation (Addendum)
Anesthesia Evaluation  Patient identified by MRN, date of birth, ID band Patient awake    Reviewed: Allergy & Precautions, NPO status , Patient's Chart, lab work & pertinent test results  History of Anesthesia Complications Negative for: history of anesthetic complications  Airway Mallampati: II  TM Distance: >3 FB Neck ROM: Full    Dental no notable dental hx. (+) Dental Advisory Given   Pulmonary neg pulmonary ROS, former smoker,    Pulmonary exam normal        Cardiovascular negative cardio ROS Normal cardiovascular exam     Neuro/Psych negative neurological ROS     GI/Hepatic Neg liver ROS, GERD  ,  Endo/Other  negative endocrine ROS  Renal/GU negative Renal ROS     Musculoskeletal  (+) Arthritis , Rheumatoid disorders,    Abdominal   Peds  Hematology negative hematology ROS (+)   Anesthesia Other Findings   Reproductive/Obstetrics                            Anesthesia Physical Anesthesia Plan  ASA: 2  Anesthesia Plan: Spinal and MAC   Post-op Pain Management:  [facility] for Post-op pain   Induction:   PONV Risk Score and Plan: 2 and Ondansetron, Propofol infusion and Midazolam  Airway Management Planned: Natural Airway  Additional Equipment:   Intra-op Plan:   Post-operative Plan:   Informed Consent: I have reviewed the patients History and Physical, chart, labs and discussed the procedure including the risks, benefits and alternatives for the proposed anesthesia with the patient or authorized representative who has indicated his/her understanding and acceptance.     Dental advisory given  Plan Discussed with: Anesthesiologist and CRNA  Anesthesia Plan Comments:        Anesthesia Quick Evaluation

## 2020-08-25 NOTE — Anesthesia Procedure Notes (Signed)
Spinal  Patient location during procedure: OR Start time: 08/25/2020 12:38 PM End time: 08/25/2020 12:48 PM Reason for block: surgical anesthesia Staffing Performed: anesthesiologist  Anesthesiologist: Duane Boston, MD Preanesthetic Checklist Completed: patient identified, IV checked, risks and benefits discussed, surgical consent, monitors and equipment checked, pre-op evaluation and timeout performed Spinal Block Patient position: sitting Prep: DuraPrep Patient monitoring: cardiac monitor, continuous pulse ox and blood pressure Approach: midline Location: L2-3 Injection technique: single-shot Needle Needle type: Pencan  Needle gauge: 24 G Needle length: 9 cm Assessment Events: CSF return Additional Notes Functioning IV was confirmed and monitors were applied. Sterile prep and drape, including hand hygiene and sterile gloves were used. The patient was positioned and the spine was prepped. The skin was anesthetized with lidocaine.  Free flow of clear CSF was obtained prior to injecting local anesthetic into the CSF.  The spinal needle aspirated freely following injection.  The needle was carefully withdrawn.  The patient tolerated the procedure well.

## 2020-08-25 NOTE — Progress Notes (Signed)
Assisted Dr. Singer with right, ultrasound guided, adductor canal block. Side rails up, monitors on throughout procedure. See vital signs in flow sheet. Tolerated Procedure well.  

## 2020-08-25 NOTE — Transfer of Care (Signed)
Immediate Anesthesia Transfer of Care Note  Patient: Allien Melberg  Procedure(s) Performed: COMPUTER ASSISTED TOTAL KNEE ARTHROPLASTY (Right: Knee)  Patient Location: PACU  Anesthesia Type:MAC and Spinal  Level of Consciousness: awake, alert  and oriented  Airway & Oxygen Therapy: Patient Spontanous Breathing  Post-op Assessment: Report given to RN and Post -op Vital signs reviewed and stable  Post vital signs: Reviewed and stable  Last Vitals:  Vitals Value Taken Time  BP 116/64 08/25/20 1445  Temp    Pulse 73 08/25/20 1447  Resp 24 08/25/20 1447  SpO2 98 % 08/25/20 1447  Vitals shown include unvalidated device data.  Last Pain:  Vitals:   08/25/20 1230  TempSrc:   PainSc: 0-No pain      Patients Stated Pain Goal: 5 (11/94/17 4081)  Complications: No notable events documented.

## 2020-08-26 ENCOUNTER — Encounter (HOSPITAL_COMMUNITY): Payer: Self-pay | Admitting: Orthopedic Surgery

## 2020-08-26 ENCOUNTER — Other Ambulatory Visit (HOSPITAL_COMMUNITY): Payer: Self-pay

## 2020-08-26 DIAGNOSIS — M06861 Other specified rheumatoid arthritis, right knee: Secondary | ICD-10-CM | POA: Diagnosis not present

## 2020-08-26 LAB — CBC
HCT: 31.1 % — ABNORMAL LOW (ref 36.0–46.0)
Hemoglobin: 9.3 g/dL — ABNORMAL LOW (ref 12.0–15.0)
MCH: 28.2 pg (ref 26.0–34.0)
MCHC: 29.9 g/dL — ABNORMAL LOW (ref 30.0–36.0)
MCV: 94.2 fL (ref 80.0–100.0)
Platelets: 276 10*3/uL (ref 150–400)
RBC: 3.3 MIL/uL — ABNORMAL LOW (ref 3.87–5.11)
RDW: 15.7 % — ABNORMAL HIGH (ref 11.5–15.5)
WBC: 8.6 10*3/uL (ref 4.0–10.5)
nRBC: 0 % (ref 0.0–0.2)

## 2020-08-26 LAB — BASIC METABOLIC PANEL
Anion gap: 8 (ref 5–15)
BUN: 8 mg/dL (ref 6–20)
CO2: 23 mmol/L (ref 22–32)
Calcium: 8.5 mg/dL — ABNORMAL LOW (ref 8.9–10.3)
Chloride: 109 mmol/L (ref 98–111)
Creatinine, Ser: 0.47 mg/dL (ref 0.44–1.00)
GFR, Estimated: >60 mL/min (ref >60)
Glucose, Bld: 130 mg/dL — ABNORMAL HIGH (ref 70–99)
Potassium: 4.2 mmol/L (ref 3.5–5.1)
Sodium: 140 mmol/L (ref 135–145)

## 2020-08-26 MED ORDER — ASPIRIN 325 MG PO TBEC
325.0000 mg | DELAYED_RELEASE_TABLET | Freq: Two times a day (BID) | ORAL | 0 refills | Status: AC
  Filled 2020-08-26: qty 40, 20d supply, fill #0

## 2020-08-26 MED ORDER — GABAPENTIN 300 MG PO CAPS
ORAL_CAPSULE | ORAL | 0 refills | Status: DC
  Filled 2020-08-26: qty 84, 42d supply, fill #0

## 2020-08-26 MED ORDER — METHOCARBAMOL 500 MG PO TABS
500.0000 mg | ORAL_TABLET | Freq: Four times a day (QID) | ORAL | 0 refills | Status: DC | PRN
  Filled 2020-08-26: qty 40, 10d supply, fill #0

## 2020-08-26 MED ORDER — SODIUM CHLORIDE 0.9 % IV BOLUS
250.0000 mL | Freq: Once | INTRAVENOUS | Status: AC
  Administered 2020-08-26: 250 mL via INTRAVENOUS

## 2020-08-26 MED ORDER — OXYCODONE HCL 5 MG PO TABS
5.0000 mg | ORAL_TABLET | Freq: Four times a day (QID) | ORAL | 0 refills | Status: DC | PRN
  Filled 2020-08-26: qty 42, 6d supply, fill #0

## 2020-08-26 NOTE — Evaluation (Signed)
Physical Therapy Evaluation Patient Details Name: Anne Garrett MRN: [id number] DOB: 05-03-1975 Today's Date: 08/26/2020   History of Present Illness  45 yo femaled s/p R TKA. PMH: L TKA, RA, bil TSA, bil THA, R ankle replacement, PLIF, L5-S1, bil wrist arthrodesis, ACDF C3-5  Clinical Impression  Pt is s/p TKA resulting in the deficits listed below (see PT Problem List).   Pt seen for mobility areas below and review TKA HEP. Tol well and pt feels ready to d/c with family assist prn. Pt is familiar with safe mobility techniques, use of RW, etc  from  prior surgeries/PT. Ready to d/c with family assist as needed from PT standpoint. RN aware of readiness.    Follow Up Recommendations Follow surgeon's recommendation for DC plan and follow-up therapies    Equipment Recommendations  None recommended by PT    Recommendations for Other Services       Precautions / Restrictions Precautions Precautions: Fall;Knee Required Braces or Orthoses: Knee Immobilizer - Right Knee Immobilizer - Right: Discontinue once straight leg raise with < 10 degree lag Restrictions Weight Bearing Restrictions: No      Mobility  Bed Mobility Overal bed mobility: Needs Assistance Bed Mobility: Supine to Sit     Supine to sit: Supervision;Modified independent (Device/Increase time)     General bed mobility comments: incr time, no physical assist    Transfers Overall transfer level: Needs assistance Equipment used: Rolling walker (2 wheeled) Transfers: Sit to/from Stand Sit to Stand: Min guard;Supervision         General transfer comment: cues for hand placement initially, pt demonstrates good carryover from prior surgeries  Ambulation/Gait Ambulation/Gait assistance: Min guard;Supervision Gait Distance (Feet): 100 Feet Assistive device: Rolling walker (2 wheeled) Gait Pattern/deviations: Step-to pattern;Decreased stance time - right     General Gait Details: supervision to min/guard for  safety, initial cues for sequence, steady gait with RW, no LOB  Stairs Stairs: Yes Stairs assistance: Min assist;Min guard Stair Management: No rails;Step to pattern;Forwards;With walker Number of Stairs: 1 General stair comments: cues for sequence, min assist for steadying RW and pt while wt shifting. pt verbalizes sequence and technique after practice, no knee buckling or instability noted  Wheelchair Mobility    Modified Rankin (Stroke Patients Only)       Balance Overall balance assessment: Mild deficits observed, not formally tested                                           Pertinent Vitals/Pain Pain Assessment: 0-10 Pain Score: 6  Pain Location: right knee Pain Descriptors / Indicators: Aching Pain Intervention(s): Premedicated before session;Monitored during session;Limited activity within patient's tolerance;Repositioned    Home Living Family/patient expects to be discharged to:: Private residence Living Arrangements: Spouse/significant other Available Help at Discharge: Family Type of Home: House Home Access: Stairs to enter Entrance Stairs-Rails: None Entrance Stairs-Number of Steps: 1 Home Layout: One level Home Equipment: Environmental consultant - 2 wheels;Walker - 4 wheels;Shower seat;Cane - single point;Electric scooter      Prior Function Level of Independence: Independent               Hand Dominance        Extremity/Trunk Assessment        Lower Extremity Assessment Lower Extremity Assessment: RLE deficits/detail RLE Deficits / Details: knee extension and hip flexion 2+/5, ankle pf/edf limited at baseline d/t  ankle replacement. knee flexion ~  10 to 60 degrees       Communication   Communication: No difficulties  Cognition Arousal/Alertness: Awake/alert Behavior During Therapy: WFL for tasks assessed/performed Overall Cognitive Status: Within Functional Limits for tasks assessed                                         General Comments      Exercises Total Joint Exercises Ankle Circles/Pumps: AROM;Both;10 reps Quad Sets: Both;5 reps;AROM Heel Slides: AAROM;Right;10 reps Hip ABduction/ADduction: AROM;Right;10 reps Straight Leg Raises: AROM;10 reps;Right Knee Flexion: AROM;5 reps;Right;Seated   Assessment/Plan    PT Assessment All further PT needs can be met in the next venue of care  PT Problem List         PT Treatment Interventions      PT Goals (Current goals can be found in the Care Plan section)  Acute Rehab PT Goals PT Goal Formulation: All assessment and education complete, DC therapy    Frequency     Barriers to discharge        Co-evaluation               AM-PAC PT "6 Clicks" Mobility  Outcome Measure Help needed turning from your back to your side while in a flat bed without using bedrails?: None Help needed moving from lying on your back to sitting on the side of a flat bed without using bedrails?: None Help needed moving to and from a bed to a chair (including a wheelchair)?: A Little Help needed standing up from a chair using your arms (e.g., wheelchair or bedside chair)?: A Little Help needed to walk in hospital room?: A Little Help needed climbing 3-5 steps with a railing? : A Little 6 Click Score: 20    End of Session Equipment Utilized During Treatment: Gait belt Activity Tolerance: Patient tolerated treatment well Patient left: in chair;with call bell/phone within reach;with chair alarm set Nurse Communication: Mobility status PT Visit Diagnosis: Difficulty in walking, not elsewhere classified (R26.2);Other abnormalities of gait and mobility (R26.89)    Time: 7893-8101 PT Time Calculation (min) (ACUTE ONLY): 35 min   Charges:   PT Evaluation $PT Eval Low Complexity: 1 Low PT Treatments $Gait Training: 8-22 mins        Baxter Flattery, PT  Acute Rehab Dept ([facility]) [phone] Pager [phone]  08/26/2020   [facility] 08/26/2020, 12:06  PM

## 2020-08-26 NOTE — Progress Notes (Signed)
Subjective: 1 Day Post-Op Procedure(s) (LRB): COMPUTER ASSISTED TOTAL KNEE ARTHROPLASTY (Right) Patient reports pain as mild.   Patient seen in rounds by Dr. Wynelle Link. Patient is well, and has had no acute complaints or problems. States she is ready to go home. Denies chest pain or SOB. Foley catheter removed this AM. We will begin therapy today.   Objective: Vital signs in last 24 hours: Temp:  [97.9 F (36.6 C)-98.9 F (37.2 C)] 97.9 F (36.6 C) (08/23 [id number]) Pulse Rate:  [61-87] 78 (08/23 0608) Resp:  [10-19] 16 (08/23 0608) BP: (97-140)/(55-80) 97/80 (08/23 0608) SpO2:  [98 %-100 %] 98 % (08/23 0608) Weight:  [54.4 kg] 54.4 kg (08/22 1031)  Intake/Output from previous day:  Intake/Output Summary (Last 24 hours) at 08/26/2020 0726 Last data filed at 08/26/2020 [id number] Gross per 24 hour  Intake 2715.09 ml  Output 3850 ml  Net -1134.91 ml     Intake/Output this shift: No intake/output data recorded.  Labs: Recent Labs    08/26/20 0314  HGB 9.3*   Recent Labs    08/26/20 0314  WBC 8.6  RBC 3.30*  HCT 31.1*  PLT 276   Recent Labs    08/26/20 0314  NA 140  K 4.2  CL 109  CO2 23  BUN 8  CREATININE 0.47  GLUCOSE 130*  CALCIUM 8.5*   No results for input(s): LABPT, INR in the last 72 hours.  Exam: General - Patient is Alert and Oriented Extremity - Neurologically intact Neurovascular intact Sensation intact distally Dorsiflexion/Plantar flexion intact Dressing - dressing C/D/I Motor Function - intact, moving foot and toes well on exam.   Past Medical History:  Diagnosis Date   Adrenal insufficiency (HCC)    secondary to Prednisone   Eczema    coner oif eye lid   Headache    migraines   Intervertebral disk disease    Juvenile idiopathic arthritis ([place])    LGSIL (low grade squamous intraepithelial dysplasia) 08/20/2013   Formatting of this note might be different from the original. On pap smear June 2015.   HR HPV positive.  Colposcopy and  cervical AND vaginal biopsies done Aug 2015   Osteoporosis    Rheumatoid arthritis ([place])    Juvenile onset    Assessment/Plan: 1 Day Post-Op Procedure(s) (LRB): COMPUTER ASSISTED TOTAL KNEE ARTHROPLASTY (Right) Principal Problem:   OA (osteoarthritis) of knee Active Problems:   Primary osteoarthritis of right knee  Estimated body mass index is 23.83 kg/m as calculated from the following:   Height as of this encounter: 4' 11.5" (1.511 m).   Weight as of this encounter: 54.4 kg. Advance diet Up with therapy   Patient's anticipated LOS is less than 2 midnights, meeting these requirements: - Younger than 74 - Lives within 1 hour of care - Has a competent adult at home to recover with post-op recover - NO history of  - Chronic pain requiring opiods  - Diabetes  - Coronary Artery Disease  - Heart failure  - Heart attack  - Stroke  - DVT/VTE  - Cardiac arrhythmia  - Respiratory Failure/COPD  - Renal failure  - Anemia  - Advanced Liver disease  DVT Prophylaxis - Aspirin Weight bearing as tolerated. Begin therapy.  BP soft this AM, bolus ordered.  Plan is to go Home after hospital stay. Plan for discharge later today if progresses with therapy. Scheduled for OPPT at [facility] Follow-up in the office in 2 weeks  The PDMP database was reviewed today prior  to any opioid medications being prescribed to this patient.  Theresa Duty, PA-C Orthopedic Surgery [phone] 08/26/2020, 7:26 AM

## 2020-08-26 NOTE — TOC Transition Note (Signed)
Transition of Care [facility]) - CM/SW Discharge Note   Patient Details  Name: Dayonna Selbe MRN: [id number] Date of Birth: 1975/12/30  [facility] CM/SW Contact:  Lennart Pall, LCSW Phone Number: 08/26/2020, 9:53 AM   Clinical Narrative:    Met briefly with patient and confirming she has all needed DME at home.  Plan for OPPT at [facility].  No further TOC needs.   Final next level of care: OP Rehab Barriers to Discharge: No Barriers Identified   Patient Goals and CMS Choice Patient states their goals for this hospitalization and ongoing recovery are:: return home      Discharge Placement                       Discharge Plan and Services                DME Arranged: N/A DME Agency: NA                  Social Determinants of Health (SDOH) Interventions     Readmission Risk Interventions No flowsheet data found.

## 2020-08-26 NOTE — Progress Notes (Signed)
Orthopedic Tech Progress Note Patient Details:  Anne Garrett Jul 20, 1975 [id number]  Patient ID: [name], female   DOB: Mar 09, 1975, 45 y.o.   MRN: [id number]  [name] 08/26/2020, 1:13 PMCPM pickup

## 2020-08-28 DIAGNOSIS — M25661 Stiffness of right knee, not elsewhere classified: Secondary | ICD-10-CM | POA: Diagnosis not present

## 2020-08-28 DIAGNOSIS — M25561 Pain in right knee: Secondary | ICD-10-CM | POA: Diagnosis not present

## 2020-09-01 ENCOUNTER — Other Ambulatory Visit (HOSPITAL_COMMUNITY): Payer: Self-pay

## 2020-09-01 NOTE — Discharge Summary (Signed)
Physician Discharge Summary   Patient ID: Anne Garrett MRN: [id number] DOB/AGE: 1975/10/29 45 y.o.  Admit date: 08/25/2020 Discharge date: 08/26/2020  Primary Diagnosis: Rheumatoid arthritis, right knee   Admission Diagnoses:  Past Medical History:  Diagnosis Date   Adrenal insufficiency ([place])    secondary to Prednisone   Eczema    coner oif eye lid   Headache    migraines   Intervertebral disk disease    Juvenile idiopathic arthritis ([place])    LGSIL (low grade squamous intraepithelial dysplasia) 08/20/2013   Formatting of this note might be different from the original. On pap smear June 2015.   HR HPV positive.  Colposcopy and cervical AND vaginal biopsies done Aug 2015   Osteoporosis    Rheumatoid arthritis ([place])    Juvenile onset   Discharge Diagnoses:   Principal Problem:   OA (osteoarthritis) of knee Active Problems:   Primary osteoarthritis of right knee  Estimated body mass index is 23.83 kg/m as calculated from the following:   Height as of this encounter: 4' 11.5" (1.511 m).   Weight as of this encounter: 54.4 kg.  Procedure:  Procedure(s) (LRB): COMPUTER ASSISTED TOTAL KNEE ARTHROPLASTY (Right)   Consults: None  HPI: Anne Garrett is a 45 y.o. year old female with end stage RA of her right knee with progressively worsening pain and dysfunction. She has constant pain, with activity and at rest and significant functional deficits with difficulties even with ADLs. She has had extensive non-op management including analgesics, injections of cortisone and home exercise program, but remains in significant pain with significant dysfunction.She has a long supracondylar plate running from her lateral knee to lateral hip with 4 screws that potentially could be in the way for a knee replacement. Radiographs show bone on bone arthritis all 3 compartments. She presents now for right Total Knee Arthroplasty with hardware removal     Laboratory Data: Admission on  08/25/2020, Discharged on 08/26/2020  Component Date Value Ref Range Status   WBC 08/26/2020 8.6  4.0 - 10.5 K/uL Final   RBC 08/26/2020 3.30 (A) 3.87 - 5.11 MIL/uL Final   Hemoglobin 08/26/2020 9.3 (A) 12.0 - 15.0 g/dL Final   HCT 08/26/2020 31.1 (A) 36.0 - 46.0 % Final   MCV 08/26/2020 94.2  80.0 - 100.0 fL Final   MCH 08/26/2020 28.2  26.0 - 34.0 pg Final   MCHC 08/26/2020 29.9 (A) 30.0 - 36.0 g/dL Final   RDW 08/26/2020 15.7 (A) 11.5 - 15.5 % Final   Platelets 08/26/2020 276  150 - 400 K/uL Final   nRBC 08/26/2020 0.0  0.0 - 0.2 % Final   Performed at [facility], [place] [address] [place], [place] [address]   Sodium 08/26/2020 140  135 - 145 mmol/L Final   Potassium 08/26/2020 4.2  3.5 - 5.1 mmol/L Final   Chloride 08/26/2020 109  98 - 111 mmol/L Final   CO2 08/26/2020 23  22 - 32 mmol/L Final   Glucose, Bld 08/26/2020 130 (A) 70 - 99 mg/dL Final   Glucose reference range applies only to samples taken after fasting for at least 8 hours.   BUN 08/26/2020 8  6 - 20 mg/dL Final   Creatinine, Ser 08/26/2020 0.47  0.44 - 1.00 mg/dL Final   Calcium 08/26/2020 8.5 (A) 8.9 - 10.3 mg/dL Final   GFR, Estimated 08/26/2020 >60  >60 mL/min Final   Comment: (NOTE) Calculated using the CKD-EPI Creatinine Equation (2021)    Anion gap  08/26/2020 8  5 - 15 Final   Performed at [facility], [place] [address] [place], [place] [address]  Orders Only on 08/21/2020  Component Date Value Ref Range Status   SARS Coronavirus 2 08/21/2020 RESULT: NEGATIVE   Final   Comment: RESULT: NEGATIVESARS-CoV-2 INTERPRETATION:A NEGATIVE  test result means that SARS-CoV-2 RNA was not present in the specimen above the limit of detection of this test. This does not preclude a possible SARS-CoV-2 infection and should not be used as the  sole basis for patient management decisions. Negative results must be combined with clinical observations, patient history, and epidemiological  information. Optimum specimen types and timing for peak viral levels during infections caused by SARS-CoV-2  have not been determined. Collection of multiple specimens or types of specimens may be necessary to detect virus. Improper specimen collection and handling, sequence variability under primers/probes, or organism present below the limit of detection may  lead to false negative results. Positive and negative predictive values of testing are highly dependent on prevalence. False negative test results are more likely when prevalence of disease is high.The expected result is NEGATIVE.Fact S                          heet for  Healthcare Providers: LocalChronicle.no Sheet for Patients: SalonLookup.es Reference Range - Negative   Hospital Outpatient Visit on 08/14/2020  Component Date Value Ref Range Status   MRSA, PCR 08/14/2020 NEGATIVE  NEGATIVE Final   Staphylococcus aureus 08/14/2020 NEGATIVE  NEGATIVE Final   Comment: (NOTE) The Xpert SA Assay (FDA approved for NASAL specimens in patients 75 years of age and older), is one component of a comprehensive surveillance program. It is not intended to diagnose infection nor to guide or monitor treatment. Performed at [facility], [place] [address], [place], [place] [address]    WBC 08/14/2020 4.8  4.0 - 10.5 K/uL Final   RBC 08/14/2020 3.78 (A) 3.87 - 5.11 MIL/uL Final   Hemoglobin 08/14/2020 10.7 (A) 12.0 - 15.0 g/dL Final   HCT 08/14/2020 35.5 (A) 36.0 - 46.0 % Final   MCV 08/14/2020 93.9  80.0 - 100.0 fL Final   MCH 08/14/2020 28.3  26.0 - 34.0 pg Final   MCHC 08/14/2020 30.1  30.0 - 36.0 g/dL Final   RDW 08/14/2020 15.9 (A) 11.5 - 15.5 % Final   Platelets 08/14/2020 310  150 - 400 K/uL Final   nRBC 08/14/2020 0.4 (A) 0.0 - 0.2 % Final   Performed at [facility], [place] [address], [place], [place] [address]   Sodium 08/14/2020 140  135 - 145  mmol/L Final   Potassium 08/14/2020 4.0  3.5 - 5.1 mmol/L Final   Chloride 08/14/2020 110  98 - 111 mmol/L Final   CO2 08/14/2020 24  22 - 32 mmol/L Final   Glucose, Bld 08/14/2020 88  70 - 99 mg/dL Final   Glucose reference range applies only to samples taken after fasting for at least 8 hours.   BUN 08/14/2020 7  6 - 20 mg/dL Final   Creatinine, Ser 08/14/2020 0.53  0.44 - 1.00 mg/dL Final   Calcium 08/14/2020 8.7 (A) 8.9 - 10.3 mg/dL Final   Total Protein 08/14/2020 6.4 (A) 6.5 - 8.1 g/dL Final   Albumin 08/14/2020 4.2  3.5 - 5.0 g/dL Final   AST 08/14/2020 20  15 - 41 U/L Final   ALT 08/14/2020 15  0 - 44 U/L  Final   Alkaline Phosphatase 08/14/2020 63  38 - 126 U/L Final   Total Bilirubin 08/14/2020 0.4  0.3 - 1.2 mg/dL Final   GFR, Estimated 08/14/2020 >60  >60 mL/min Final   Comment: (NOTE) Calculated using the CKD-EPI Creatinine Equation (2021)    Anion gap 08/14/2020 6  5 - 15 Final   Performed at [facility], [place] [address], [place], [place] [address]   Prothrombin Time 08/14/2020 13.8  11.4 - 15.2 seconds Final   INR 08/14/2020 1.1  0.8 - 1.2 Final   Comment: (NOTE) INR goal varies based on device and disease states. Performed at [facility], [place] [address] [place], [place] [address]      X-Rays:No results found.  EKG: Orders placed or performed during the hospital encounter of 03/05/19   EKG 12-Lead   EKG 12-Lead   ED EKG   ED EKG     Hospital Course: Anne Garrett is a 45 y.o. who was admitted to [facility]. They were brought to the operating room on 08/25/2020 and underwent Procedure(s): Lena.  Patient tolerated the procedure well and was later transferred to the recovery room and then to the orthopaedic floor for postoperative care. They were given PO and IV analgesics for pain control following their surgery. They were given 24 hours of postoperative antibiotics of   Anti-infectives (From admission, onward)    Start     Dose/Rate Route Frequency Ordered Stop   08/25/20 1900  ceFAZolin (ANCEF) IVPB 2g/100 mL premix        2 g 200 mL/hr over 30 Minutes Intravenous Every 6 hours 08/25/20 1602 08/26/20 0723   08/25/20 1015  ceFAZolin (ANCEF) IVPB 2g/100 mL premix        2 g 200 mL/hr over 30 Minutes Intravenous On call to O.R. 08/25/20 1014 08/25/20 1316      and started on DVT prophylaxis in the form of Aspirin.   PT and OT were ordered for total joint protocol. Discharge planning consulted to help with postop disposition and equipment needs.  Patient had a good night on the evening of surgery. They started to get up OOB with therapy on POD #1. Pt was seen during rounds and was ready to go home pending progress with therapy. She worked with therapy on POD #1 and was meeting her goals. Pt was discharged to home later that day in stable condition.  Diet: Regular diet Activity: WBAT Follow-up: in 2 weeks Disposition: Home with OPPT Discharged Condition: stable   Discharge Instructions     Call MD / Call 911   Complete by: As directed    If you experience chest pain or shortness of breath, CALL 911 and be transported to the hospital emergency room.  If you develope a fever above 101 F, pus (white drainage) or increased drainage or redness at the wound, or calf pain, call your surgeon's office.   Change dressing   Complete by: As directed    You may remove the bulky bandage (ACE wrap and gauze) two days after surgery. You will have an adhesive waterproof bandage underneath. Leave this in place until your first follow-up appointment.   Constipation Prevention   Complete by: As directed    Drink plenty of fluids.  Prune juice may be helpful.  You may use a stool softener, such as Colace (over the counter) 100 mg twice a day.  Use MiraLax (over the counter) for constipation  as needed.   Diet - low sodium heart healthy   Complete by: As directed    Do not  put a pillow under the knee. Place it under the heel.   Complete by: As directed    Driving restrictions   Complete by: As directed    No driving for two weeks   Post-operative opioid taper instructions:   Complete by: As directed    POST-OPERATIVE OPIOID TAPER INSTRUCTIONS: It is important to wean off of your opioid medication as soon as possible. If you do not need pain medication after your surgery it is ok to stop day one. Opioids include: Codeine, Hydrocodone(Norco, Vicodin), Oxycodone(Percocet, oxycontin) and hydromorphone amongst others.  Long term and even short term use of opiods can cause: Increased pain response Dependence Constipation Depression Respiratory depression And more.  Withdrawal symptoms can include Flu like symptoms Nausea, vomiting And more Techniques to manage these symptoms Hydrate well Eat regular healthy meals Stay active Use relaxation techniques(deep breathing, meditating, yoga) Do Not substitute Alcohol to help with tapering If you have been on opioids for less than two weeks and do not have pain than it is ok to stop all together.  Plan to wean off of opioids This plan should start within one week post op of your joint replacement. Maintain the same interval or time between taking each dose and first decrease the dose.  Cut the total daily intake of opioids by one tablet each day Next start to increase the time between doses. The last dose that should be eliminated is the evening dose.      TED hose   Complete by: As directed    Use stockings (TED hose) for three weeks on both leg(s).  You may remove them at night for sleeping.   Weight bearing as tolerated   Complete by: As directed       Allergies as of 08/26/2020       Reactions   Sulfa Antibiotics Itching, Rash   Head to toe   Teriparatide Other (See Comments)   Severe Muscle Pain, [place]   Valproic Acid And Related Rash   Head to toe   Cyclobenzaprine Swelling   Left foot  swelling   Omeprazole Diarrhea   Severe diarrhea    Plaquenil [hydroxychloroquine] Diarrhea   Celexa [citalopram] Diarrhea      Diflucan [fluconazole] Rash   Fixed drug eruption   Divalproex Sodium Rash   Leflunomide    Neuropathy in both feet   Paroxetine Hcl Diarrhea   Zoloft [sertraline] Diarrhea        Medication List     STOP taking these medications    leflunomide 10 MG tablet Commonly known as: ARAVA       TAKE these medications    Actemra ACTPen 162 MG/0.9ML So[place] Generic drug: Tocilizumab as directed Subcutaneous weekly 30 days What changed:  how much to take how to take this when to take this   amoxicillin 500 MG capsule Commonly known as: AMOXIL Take 4 capsules (2,000 mg total) by mouth 1 hour before dental appointment. What changed:  when to take this additional instructions   aspirin EC 325 MG tablet Take 1 tablet by mouth 2 times daily for 20 days. Then take one 81 mg aspirin once a day for 3 weeks. Then discontinue aspirin.   BIOTIN PO Take 1 tablet by mouth daily.   butalbital-acetaminophen-caffeine 50-325-40 MG tablet Commonly known as: FIORICET Take 1 tablet by mouth every 6 (six) hours  as needed for headache.   CALCIUM+D3 PO Take 1 tablet by mouth daily.   gabapentin 300 MG capsule Commonly known as: NEURONTIN Take 1 capsule by mouth 3 times a day for 2 weeks following surgery. Then take 1 capsule twice daily for 2 weeks. Then take 1 capsule once daily for 2 weeks. Then discontinue. What changed: additional instructions   lactobacillus acidophilus & bulgar chewable tablet Chew 1-2 tablets by mouth daily.   methocarbamol 500 MG tablet Commonly known as: ROBAXIN Take 1 tablet (500 mg total) by mouth every 6 (six) hours as needed for muscle spasms.   nystatin 100000 UNIT/ML suspension Commonly known as: MYCOSTATIN SWISH AND SPIT 5MLS BY MOUTH 4 TIMES DAILY AS DIRECTED What changed:  how much to take how to take this when to  take this reasons to take this   OVER THE COUNTER MEDICATION Take 2 capsules by mouth daily. Juice plus Omega   OVER THE COUNTER MEDICATION Take 2 capsules by mouth daily. Juice plus Vegetable   oxyCODONE 5 MG immediate release tablet Commonly known as: Oxy IR/ROXICODONE Take 1-2 tablets (5-10 mg total) by mouth every 6 (six) hours as needed for severe pain. What changed:  how much to take when to take this reasons to take this   pantoprazole 20 MG tablet Commonly known as: Protonix Take 1 tablet (20 mg total) by mouth daily.   predniSONE 5 MG tablet Commonly known as: DELTASONE Take 1-2 tablets (5-10 mg total) by mouth daily. What changed:  how much to take when to take this   [place] 1 application topically See admin instructions. Apply dime-size amount to face 1-2 TIMES per day as needed rosacea   Metronidazole 1% + Ivermectin 1% + Azelaic Acid 15%   traMADol 50 MG tablet Commonly known as: ULTRAM Take 1-2 tablets (50-100 mg total) by mouth every 8 (eight) hours as needed for pain What changed:  when to take this additional instructions   triamcinolone cream 0.1 % Commonly known as: KENALOG Apply 1 application topically 2 (two) times daily as needed. What changed: reasons to take this   Vitamin D3 50 MCG (2000 UT) Tabs Take 2,000 Units by mouth daily.               Discharge Care Instructions  (From admission, onward)           Start     Ordered   08/26/20 0000  Weight bearing as tolerated        08/26/20 0731   08/26/20 0000  Change dressing       Comments: You may remove the bulky bandage (ACE wrap and gauze) two days after surgery. You will have an adhesive waterproof bandage underneath. Leave this in place until your first follow-up appointment.   08/26/20 0731            Follow-up Information     [name], MD. Schedule an appointment as soon as possible for a visit on 09/09/2020.   Specialty: Orthopedic  Surgery Contact information: [address] [place] [address] [id number]                 Signed: Theresa Duty, PA-C Orthopedic Surgery 09/01/2020, 10:34 AM

## 2020-09-02 ENCOUNTER — Other Ambulatory Visit (HOSPITAL_COMMUNITY): Payer: Self-pay

## 2020-09-02 DIAGNOSIS — M25561 Pain in right knee: Secondary | ICD-10-CM | POA: Diagnosis not present

## 2020-09-02 MED ORDER — OXYCODONE HCL 5 MG PO TABS
ORAL_TABLET | ORAL | 0 refills | Status: DC
  Filled 2020-09-02: qty 42, 7d supply, fill #0

## 2020-09-05 DIAGNOSIS — M25561 Pain in right knee: Secondary | ICD-10-CM | POA: Diagnosis not present

## 2020-09-10 ENCOUNTER — Other Ambulatory Visit (HOSPITAL_COMMUNITY): Payer: Self-pay

## 2020-09-10 DIAGNOSIS — M25561 Pain in right knee: Secondary | ICD-10-CM | POA: Diagnosis not present

## 2020-09-10 MED ORDER — OXYCODONE HCL 5 MG PO TABS
5.0000 mg | ORAL_TABLET | Freq: Three times a day (TID) | ORAL | 0 refills | Status: DC | PRN
  Filled 2020-09-10: qty 30, 5d supply, fill #0

## 2020-09-12 DIAGNOSIS — M25561 Pain in right knee: Secondary | ICD-10-CM | POA: Diagnosis not present

## 2020-09-13 ENCOUNTER — Telehealth: Payer: 59 | Admitting: Nurse Practitioner

## 2020-09-13 DIAGNOSIS — L03012 Cellulitis of left finger: Secondary | ICD-10-CM

## 2020-09-13 MED ORDER — CEPHALEXIN 500 MG PO CAPS: 500.0000 mg | ORAL_CAPSULE | Freq: Four times a day (QID) | ORAL | 0 refills | Status: DC

## 2020-09-13 NOTE — Progress Notes (Signed)

## 2020-09-15 ENCOUNTER — Other Ambulatory Visit (HOSPITAL_COMMUNITY): Payer: Self-pay

## 2020-09-15 DIAGNOSIS — M25561 Pain in right knee: Secondary | ICD-10-CM | POA: Diagnosis not present

## 2020-09-19 DIAGNOSIS — M25561 Pain in right knee: Secondary | ICD-10-CM | POA: Diagnosis not present

## 2020-09-22 DIAGNOSIS — M25561 Pain in right knee: Secondary | ICD-10-CM | POA: Diagnosis not present

## 2020-09-25 ENCOUNTER — Ambulatory Visit (INDEPENDENT_AMBULATORY_CARE_PROVIDER_SITE_OTHER): Payer: 59 | Admitting: Internal Medicine

## 2020-09-25 ENCOUNTER — Encounter: Payer: Self-pay | Admitting: Internal Medicine

## 2020-09-25 ENCOUNTER — Other Ambulatory Visit: Payer: Self-pay | Admitting: Family Medicine

## 2020-09-25 ENCOUNTER — Other Ambulatory Visit: Payer: Self-pay

## 2020-09-25 VITALS — BP 110/66 | HR 94 | Ht 59.5 in | Wt 116.4 lb

## 2020-09-25 DIAGNOSIS — N958 Other specified menopausal and perimenopausal disorders: Secondary | ICD-10-CM

## 2020-09-25 DIAGNOSIS — M8580 Other specified disorders of bone density and structure, unspecified site: Secondary | ICD-10-CM | POA: Diagnosis not present

## 2020-09-25 DIAGNOSIS — M25561 Pain in right knee: Secondary | ICD-10-CM | POA: Diagnosis not present

## 2020-09-25 NOTE — Patient Instructions (Signed)
Calcium 1200 mg daily  Vitamin D 2000 iu daily  

## 2020-09-25 NOTE — Progress Notes (Signed)
Name: Anne Garrett  MRN/ DOB: [id number], 07-29-1975    Age/ Sex: 45 y.o., female    PCP: [name], MD   Reason for Endocrinology Evaluation: Low bone density      Date of Initial Endocrinology Evaluation: 09/25/2020     HPI: [name] is a 45 y.o. female with a past medical history of Low bone density , RA, secondary adrenal insufficiency due to chronic steroid use The patient presented for initial endocrinology clinic visit on 09/25/2020 for consultative assistance with her Osteoporosis .   Pt was diagnosed with low bone density: 2018  Menarche at age : [age] Fracture Hx: right femur , s/p simple  fall  Hx of HRT: no , just COC's  FH of osteoporosis or hip fracture: no Prior Hx of anti-estrogenic therapy : no Prior Hx of anti-resorptive therapy : In her 20's she was on calcitonin, fosamax, forteo ( caused muscle pains - a couple of month) , Boniva  was on 5 yrs ( have been off Boniva for a yr )    She sees Rheumatology - Dr. Annamaria Boots  Prednisone 5 mg daily   Calcium - Vitamin D 1 tab  Vitamin D 2000 iu daily    HISTORY:  Past Medical History:  Past Medical History:  Diagnosis Date   Adrenal insufficiency (HCC)    secondary to Prednisone   Eczema    coner oif eye lid   Headache    migraines   Intervertebral disk disease    Juvenile idiopathic arthritis ([place])    LGSIL (low grade squamous intraepithelial dysplasia) 08/20/2013   Formatting of this note might be different from the original. On pap smear June 2015.   HR HPV positive.  Colposcopy and cervical AND vaginal biopsies done Aug 2015   Osteoporosis    Rheumatoid arthritis ([place])    Juvenile onset   Past Surgical History:  Past Surgical History:  Procedure Laterality Date   ANKLE ARTHROPLASTY Right    ANTERIOR CERVICAL DECOMP/DISCECTOMY FUSION N/A 06/19/2015   Procedure: C3-4 C4-5 Anterior cervical decompression/diskectomy/fusion;  Surgeon: Jovita Gamma, MD;  Location: [facility] [facility];   Service: Neurosurgery;  Laterality: N/A;  C3-4 C4-5 Anterior cervical decompression/diskectomy/fusion   APPENDECTOMY     CARPAL TUNNEL RELEASE Right    CESAREAN SECTION     CHOLECYSTECTOMY     CHOLECYSTECTOMY  2006   COLONOSCOPY     ESSURE TUBAL LIGATION     Essure Implant-Permanent birth control   excision to palm Left    excision of mass to right plam   FOOT ARTHRODESIS Left 03/01/2019   Procedure: Left talonavicular and naviculocuneiform arthrodesis;  Surgeon: [name], MD;  Location: [place];  Service: Orthopedics;  Laterality: Left;   HARDWARE REMOVAL Right 02/02/2017   Procedure: RIGHT WRIST DEEP IMPLANT REMOVAL, RIGHT WRIST AND HAND EXTENSOR TENDON RECONSTRUCTION, TENDON TRANSFER AND RECONSTRUCTION AS INDICATED;  Surgeon: Iran Planas, MD;  Location: [place];  Service: Orthopedics;  Laterality: Right;   IRRIGATION AND DEBRIDEMENT SHOULDER Right 11/13/2019   Procedure: IRRIGATION AND DEBRIDEMENT RIGHT SHOULDER WITH WOUND CLOSURE;  Surgeon: [name], MD;  Location: [place];  Service: Orthopedics;  Laterality: Right;  60min   JOINT REPLACEMENT Bilateral    bilat hip replacements   KNEE ARTHROPLASTY Right 08/25/2020   Procedure: COMPUTER ASSISTED TOTAL KNEE ARTHROPLASTY;  Surgeon: [name], MD;  [facility];  Service: Orthopedics;  Laterality: Right;   ORIF FEMUR FRACTURE Right  OTHER SURGICAL HISTORY  06/04/2016   Revision of PLIF   plif l5-s1 arthrodeisis    2018   POSTERIOR CERVICAL FUSION/FORAMINOTOMY N/A 07/12/2015   Procedure: C2 to C5 Cervical laminectomy, C2 to C5 posterior cervical arthrodesis with instrumentation and bone graft;  Surgeon: Jovita Gamma, MD;  Location: [place] [facility];  Service: Neurosurgery;  Laterality: N/A;  C2 to C5 Cervical laminectomy, C2  to C5 posterior cervical arthrodesis with instrumentation and bone graft   REPAIR EXTENSOR TENDON Right 02/02/2017   Procedure: REPAIR EXTENSOR TENDON;  Surgeon: Iran Planas, MD;   Location: [place];  Service: Orthopedics;  Laterality: Right;   REVERSE SHOULDER ARTHROPLASTY Left 09/07/2018   Procedure: REVERSE SHOULDER ARTHROPLASTY;  Surgeon: [name], MD;  [facility];  Service: Orthopedics;  Laterality: Left;   REVERSE SHOULDER ARTHROPLASTY Right 09/20/2019   Procedure: REVERSE SHOULDER ARTHROPLASTY;  Surgeon: [name], MD;  [facility];  Service: Orthopedics;  Laterality: Right;  177min   REVISION TOTAL SHOULDER TO REVERSE TOTAL SHOULDER Right 11/01/2019   Procedure: revision right total shoulder to reverse total shoulder;  Surgeon: [name], MD;  [facility];  Service: Orthopedics;  Laterality: Right;  exparel block   right foot surgery Right    right jfoot arthrodesis and right ankle arthroplasty   TOTAL KNEE ARTHROPLASTY Left 04/21/2020   Procedure: TOTAL KNEE ARTHROPLASTY;  Surgeon: [name], MD;  [facility];  Service: Orthopedics;  Laterality: Left;  29min   WRIST SURGERY Bilateral    arthrodesis    Social History:  reports that she quit smoking about 5 years ago. Her smoking use included cigarettes. She has a 5.00 pack-year smoking history. She has been exposed to tobacco smoke. She has never used smokeless tobacco. She reports current alcohol use of about 2.0 standard drinks per week. She reports that she does not use drugs. Family History: family history includes Breast cancer in her maternal aunt; Breast cancer (age of onset: 26) in her mother; Diabetes in her daughter; Epilepsy in her father; Multiple sclerosis in her maternal uncle; Nephritis in her father; Psoriasis in her paternal aunt.   HOME MEDICATIONS: Allergies as of 09/25/2020       Reactions   Sulfa Antibiotics Itching, Rash   Head to toe   Teriparatide Other (See Comments)   Severe Muscle Pain, Forteo   Valproic Acid And Related Rash   Head to toe   Cyclobenzaprine Swelling   Left foot swelling   Omeprazole Diarrhea   Severe diarrhea    Plaquenil  [hydroxychloroquine] Diarrhea   Celexa [citalopram] Diarrhea      Diflucan [fluconazole] Rash   Fixed drug eruption   Divalproex Sodium Rash   Leflunomide    Neuropathy in both feet   Paroxetine Hcl Diarrhea   Zoloft [sertraline] Diarrhea        Medication List        Accurate as of September 25, 2020  8:10 AM. If you have any questions, ask your nurse or doctor.          STOP taking these medications    OVER THE COUNTER MEDICATION Stopped by: Dorita Sciara, MD   oxyCODONE 5 MG immediate release tablet Commonly known as: Oxy IR/ROXICODONE Stopped by: Dorita Sciara, MD       TAKE these medications    Actemra ACTPen 162 MG/0.9ML Soaj Generic drug: Tocilizumab as directed Subcutaneous weekly 30 days What changed:  how much to take how to take this when to  take this   amoxicillin 500 MG capsule Commonly known as: AMOXIL Take 4 capsules (2,000 mg total) by mouth 1 hour before dental appointment. What changed:  when to take this additional instructions   BIOTIN PO Take 1 tablet by mouth daily.   butalbital-acetaminophen-caffeine 50-325-40 MG tablet Commonly known as: FIORICET Take 1 tablet by mouth every 6 (six) hours as needed for headache.   CALCIUM+D3 PO Take 1 tablet by mouth daily.   cephALEXin 500 MG capsule Commonly known as: Keflex Take 1 capsule (500 mg total) by mouth 4 (four) times daily.   gabapentin 300 MG capsule Commonly known as: NEURONTIN Take 1 capsule by mouth 3 times a day for 2 weeks following surgery. Then take 1 capsule twice daily for 2 weeks. Then take 1 capsule once daily for 2 weeks. Then discontinue.   lactobacillus acidophilus & bulgar chewable tablet Chew 1-2 tablets by mouth daily.   methocarbamol 500 MG tablet Commonly known as: ROBAXIN Take 1 tablet (500 mg total) by mouth every 6 (six) hours as needed for muscle spasms.   nystatin 100000 UNIT/ML suspension Commonly known as: MYCOSTATIN SWISH AND  SPIT 5MLS BY MOUTH 4 TIMES DAILY AS DIRECTED What changed:  how much to take how to take this when to take this reasons to take this   OVER THE COUNTER MEDICATION Take 2 capsules by mouth daily. Juice plus Vegetable   pantoprazole 20 MG tablet Commonly known as: Protonix Take 1 tablet (20 mg total) by mouth daily.   predniSONE 5 MG tablet Commonly known as: DELTASONE Take 1-2 tablets (5-10 mg total) by mouth daily. What changed:  how much to take when to take this   [place] 1 application topically See admin instructions. Apply dime-size amount to face 1-2 TIMES per day as needed rosacea   Metronidazole 1% + Ivermectin 1% + Azelaic Acid 15%   traMADol 50 MG tablet Commonly known as: ULTRAM Take 1-2 tablets (50-100 mg total) by mouth every 8 (eight) hours as needed for pain What changed:  when to take this additional instructions   triamcinolone cream 0.1 % Commonly known as: KENALOG Apply 1 application topically 2 (two) times daily as needed. What changed: reasons to take this   Vitamin D3 50 MCG (2000 UT) Tabs Take 2,000 Units by mouth daily.          REVIEW OF SYSTEMS: A comprehensive ROS was conducted with the patient and is negative except as per HPI and below:  ROS     OBJECTIVE:  VS: BP 110/66 (BP Location: Left Arm, Patient Position: Sitting, Cuff Size: Small)   Pulse 94   Ht 4' 11.5" (1.511 m)   Wt 116 lb 6.4 oz (52.8 kg)   SpO2 99%   BMI 23.12 kg/m    Wt Readings from Last 3 Encounters:  09/25/20 116 lb 6.4 oz (52.8 kg)  08/25/20 120 lb (54.4 kg)  08/14/20 120 lb (54.4 kg)     EXAM: General: Pt appears well and is in NAD  Neck: General: Supple without adenopathy. Thyroid: Thyroid size normal.  No goiter or nodules appreciated.  Lungs: Clear with good BS bilat with no rales, rhonchi, or wheezes  Heart: Auscultation: RRR.  Abdomen: Normoactive bowel sounds, soft, nontender, without masses or organomegaly palpable   Extremities:  BL LE: No pretibial edema normal ROM and strength.  Mental Status: Judgment, insight: Intact Orientation: Oriented to time, place, and person Mood and affect: No depression, anxiety, or agitation  DATA REVIEWED:   Results for ALVENA, KIERNAN (MRN [id number]) as of 09/25/2020 07:37  Ref. Range 08/26/2020 03:14  Sodium Latest Ref Range: 135 - 145 mmol/L 140  Potassium Latest Ref Range: 3.5 - 5.1 mmol/L 4.2  Chloride Latest Ref Range: 98 - 111 mmol/L 109  CO2 Latest Ref Range: 22 - 32 mmol/L 23  Glucose Latest Ref Range: 70 - 99 mg/dL 130 (H)  BUN Latest Ref Range: 6 - 20 mg/dL 8  Creatinine Latest Ref Range: 0.44 - 1.00 mg/dL 0.47  Calcium Latest Ref Range: 8.9 - 10.3 mg/dL 8.5 (L)  Anion gap Latest Ref Range: 5 - 15  8  GFR, Estimated Latest Ref Range: >60 mL/min >60    ASSESSMENT/PLAN/RECOMMENDATIONS:   Low Bone Density in Premenopausal :  - Pt with low bone density due to chronic glucocorticoid use for rheumatoid arthritis - Over the years she has been on calcitonin, Forteo - developed extreme muscle pain) , Fosama and lastely Boniva. She was on Boniva for ~ 5 years and has been on a drug holiday for a year  - Her most recent DXA showed a Z-score of -2.1 . She had a previous bone density (different location ) with a Z-score -3.4  - I have recommended optimizing calcium intake to 1200 mg daily, historically she had has low serum calcium  - Will check labs in 2 weeks  - I have recommended continuing with the drug holiday for another year, [facility] and will reassess - Breifly discussed Evenity and Prolia as option in the future   Medications : Increase Calcium to 1200 mg daily  Continue Vitamin D 2000 iu daily    F/U in 8 months  Labs in 2 weeks   Signed electronically by: Mack Guise, MD  [facility]  [facility] [address] [place], [place] [address] Phone:  [phone] FAX: [phone]   CC: [name], [place] [address] Phone: [phone] Fax: [phone]   Return to Endocrinology clinic as below: No future appointments.

## 2020-09-26 ENCOUNTER — Other Ambulatory Visit (HOSPITAL_COMMUNITY): Payer: Self-pay

## 2020-09-26 MED ORDER — BUTALBITAL-APAP-CAFFEINE 50-325-40 MG PO TABS
1.0000 | ORAL_TABLET | Freq: Four times a day (QID) | ORAL | 0 refills | Status: DC | PRN
  Filled 2020-09-26: qty 30, 8d supply, fill #0

## 2020-09-26 NOTE — Telephone Encounter (Signed)
Last Refill 06/25/2020 Last OV 04/07/2020 dx globulus sensation

## 2020-09-30 DIAGNOSIS — Z471 Aftercare following joint replacement surgery: Secondary | ICD-10-CM | POA: Diagnosis not present

## 2020-09-30 DIAGNOSIS — Z96651 Presence of right artificial knee joint: Secondary | ICD-10-CM | POA: Diagnosis not present

## 2020-10-06 ENCOUNTER — Other Ambulatory Visit (HOSPITAL_COMMUNITY): Payer: Self-pay

## 2020-10-07 ENCOUNTER — Other Ambulatory Visit (HOSPITAL_COMMUNITY): Payer: Self-pay

## 2020-10-07 MED ORDER — TRAMADOL HCL 50 MG PO TABS
50.0000 mg | ORAL_TABLET | Freq: Three times a day (TID) | ORAL | 1 refills | Status: DC | PRN
  Filled 2020-10-07: qty 120, 20d supply, fill #0

## 2020-10-10 ENCOUNTER — Other Ambulatory Visit: Payer: 59

## 2020-10-17 ENCOUNTER — Other Ambulatory Visit (INDEPENDENT_AMBULATORY_CARE_PROVIDER_SITE_OTHER): Payer: 59

## 2020-10-17 ENCOUNTER — Other Ambulatory Visit: Payer: Self-pay

## 2020-10-17 DIAGNOSIS — M859 Disorder of bone density and structure, unspecified: Secondary | ICD-10-CM

## 2020-10-17 DIAGNOSIS — N958 Other specified menopausal and perimenopausal disorders: Secondary | ICD-10-CM | POA: Diagnosis not present

## 2020-10-17 LAB — PHOSPHORUS: Phosphorus: 2.9 mg/dL (ref 2.3–4.6)

## 2020-10-17 LAB — MAGNESIUM: Magnesium: 1.9 mg/dL (ref 1.5–2.5)

## 2020-10-17 LAB — BASIC METABOLIC PANEL
BUN: 8 mg/dL (ref 6–23)
CO2: 24 mEq/L (ref 19–32)
Calcium: 9.2 mg/dL (ref 8.4–10.5)
Chloride: 107 mEq/L (ref 96–112)
Creatinine, Ser: 0.6 mg/dL (ref 0.40–1.20)
GFR: 106.94 mL/min (ref >60.00)
Glucose, Bld: 59 mg/dL — ABNORMAL LOW (ref 70–99)
Potassium: 3.4 mEq/L — ABNORMAL LOW (ref 3.5–5.1)
Sodium: 139 mEq/L (ref 135–145)

## 2020-10-17 LAB — TSH: TSH: 1.07 u[IU]/mL (ref 0.35–5.50)

## 2020-10-17 LAB — ALBUMIN: Albumin: 4.2 g/dL (ref 3.5–5.2)

## 2020-10-17 LAB — T4, FREE: Free T4: 0.89 ng/dL (ref 0.60–1.60)

## 2020-10-17 LAB — VITAMIN D 25 HYDROXY (VIT D DEFICIENCY, FRACTURES): VITD: 53.9 ng/mL (ref 30.00–100.00)

## 2020-10-20 LAB — PARATHYROID HORMONE, INTACT (NO CA): PTH: 29 pg/mL (ref 16–77)

## 2020-11-03 ENCOUNTER — Other Ambulatory Visit (HOSPITAL_COMMUNITY): Payer: Self-pay

## 2020-11-03 ENCOUNTER — Other Ambulatory Visit: Payer: Self-pay | Admitting: Pharmacist

## 2020-11-03 MED ORDER — ACTEMRA ACTPEN 162 MG/0.9ML ~~LOC~~ SOAJ
SUBCUTANEOUS | 2 refills | Status: DC
  Filled 2020-11-03: qty 3.6, 28d supply, fill #0

## 2020-11-04 ENCOUNTER — Other Ambulatory Visit (HOSPITAL_COMMUNITY): Payer: Self-pay

## 2020-11-04 ENCOUNTER — Other Ambulatory Visit: Payer: Self-pay | Admitting: Pharmacist

## 2020-11-04 MED ORDER — ACTEMRA ACTPEN 162 MG/0.9ML ~~LOC~~ SOAJ
SUBCUTANEOUS | 2 refills | Status: AC
  Filled 2020-11-04: qty 3.6, 28d supply, fill #0

## 2020-11-04 MED ORDER — ACTEMRA ACTPEN 162 MG/0.9ML ~~LOC~~ SOAJ: SUBCUTANEOUS | 2 refills | Status: DC

## 2020-11-17 ENCOUNTER — Other Ambulatory Visit (HOSPITAL_COMMUNITY): Payer: Self-pay

## 2020-11-24 ENCOUNTER — Other Ambulatory Visit (HOSPITAL_COMMUNITY): Payer: Self-pay

## 2020-12-10 ENCOUNTER — Other Ambulatory Visit: Payer: Self-pay | Admitting: Family Medicine

## 2020-12-10 ENCOUNTER — Other Ambulatory Visit (HOSPITAL_COMMUNITY): Payer: Self-pay

## 2020-12-10 MED ORDER — BUTALBITAL-APAP-CAFFEINE 50-325-40 MG PO TABS
1.0000 | ORAL_TABLET | Freq: Four times a day (QID) | ORAL | 0 refills | Status: DC | PRN
  Filled 2020-12-10: qty 30, 8d supply, fill #0

## 2020-12-12 ENCOUNTER — Other Ambulatory Visit (HOSPITAL_COMMUNITY): Payer: Self-pay

## 2020-12-12 DIAGNOSIS — M0579 Rheumatoid arthritis with rheumatoid factor of multiple sites without organ or systems involvement: Secondary | ICD-10-CM | POA: Diagnosis not present

## 2020-12-12 DIAGNOSIS — M810 Age-related osteoporosis without current pathological fracture: Secondary | ICD-10-CM | POA: Diagnosis not present

## 2020-12-12 DIAGNOSIS — M150 Primary generalized (osteo)arthritis: Secondary | ICD-10-CM | POA: Diagnosis not present

## 2020-12-12 DIAGNOSIS — M2550 Pain in unspecified joint: Secondary | ICD-10-CM | POA: Diagnosis not present

## 2021-01-02 DIAGNOSIS — Z96651 Presence of right artificial knee joint: Secondary | ICD-10-CM | POA: Diagnosis not present

## 2021-01-02 DIAGNOSIS — Z96641 Presence of right artificial hip joint: Secondary | ICD-10-CM | POA: Diagnosis not present

## 2021-01-07 ENCOUNTER — Other Ambulatory Visit: Payer: Self-pay | Admitting: Obstetrics and Gynecology

## 2021-01-07 DIAGNOSIS — Z1231 Encounter for screening mammogram for malignant neoplasm of breast: Secondary | ICD-10-CM

## 2021-01-23 DIAGNOSIS — H11821 Conjunctivochalasis, right eye: Secondary | ICD-10-CM | POA: Diagnosis not present

## 2021-02-10 ENCOUNTER — Ambulatory Visit
Admission: RE | Admit: 2021-02-10 | Discharge: 2021-02-10 | Disposition: A | Discharge status: Home / Self Care | Payer: BC Managed Care – PPO | Source: Ambulatory Visit

## 2021-02-10 DIAGNOSIS — Z1231 Encounter for screening mammogram for malignant neoplasm of breast: Secondary | ICD-10-CM

## 2021-02-10 DIAGNOSIS — Z124 Encounter for screening for malignant neoplasm of cervix: Secondary | ICD-10-CM | POA: Diagnosis not present

## 2021-02-10 DIAGNOSIS — Z6825 Body mass index (BMI) 25.0-25.9, adult: Secondary | ICD-10-CM | POA: Diagnosis not present

## 2021-02-10 DIAGNOSIS — Z01419 Encounter for gynecological examination (general) (routine) without abnormal findings: Secondary | ICD-10-CM | POA: Diagnosis not present

## 2021-02-11 ENCOUNTER — Other Ambulatory Visit: Payer: Self-pay

## 2021-02-14 ENCOUNTER — Other Ambulatory Visit (HOSPITAL_COMMUNITY): Payer: Self-pay

## 2021-02-16 ENCOUNTER — Other Ambulatory Visit (HOSPITAL_COMMUNITY): Payer: Self-pay

## 2021-02-16 MED ORDER — PREDNISONE 5 MG PO TABS
5.0000 mg | ORAL_TABLET | Freq: Every day | ORAL | 1 refills | Status: DC
  Filled 2021-02-16: qty 180, 90d supply, fill #0
  Filled 2021-07-20: qty 180, 90d supply, fill #1

## 2021-03-05 ENCOUNTER — Encounter: Payer: Self-pay | Admitting: Internal Medicine

## 2021-03-13 DIAGNOSIS — M0579 Rheumatoid arthritis with rheumatoid factor of multiple sites without organ or systems involvement: Secondary | ICD-10-CM | POA: Diagnosis not present

## 2021-03-13 DIAGNOSIS — M150 Primary generalized (osteo)arthritis: Secondary | ICD-10-CM | POA: Diagnosis not present

## 2021-03-13 DIAGNOSIS — M810 Age-related osteoporosis without current pathological fracture: Secondary | ICD-10-CM | POA: Diagnosis not present

## 2021-03-13 DIAGNOSIS — M2550 Pain in unspecified joint: Secondary | ICD-10-CM | POA: Diagnosis not present

## 2021-04-01 DIAGNOSIS — Z96611 Presence of right artificial shoulder joint: Secondary | ICD-10-CM | POA: Diagnosis not present

## 2021-04-20 ENCOUNTER — Other Ambulatory Visit (HOSPITAL_COMMUNITY): Payer: Self-pay

## 2021-04-20 ENCOUNTER — Encounter: Payer: Self-pay | Admitting: Family Medicine

## 2021-04-20 ENCOUNTER — Other Ambulatory Visit: Payer: Self-pay | Admitting: Family Medicine

## 2021-04-21 ENCOUNTER — Other Ambulatory Visit (HOSPITAL_COMMUNITY): Payer: Self-pay

## 2021-04-21 MED ORDER — BUTALBITAL-APAP-CAFFEINE 50-325-40 MG PO TABS
1.0000 | ORAL_TABLET | Freq: Four times a day (QID) | ORAL | 0 refills | Status: DC | PRN
  Filled 2021-04-21: qty 30, 8d supply, fill #0

## 2021-04-22 ENCOUNTER — Other Ambulatory Visit (HOSPITAL_COMMUNITY): Payer: Self-pay

## 2021-05-04 ENCOUNTER — Other Ambulatory Visit (HOSPITAL_COMMUNITY): Payer: Self-pay

## 2021-05-04 MED ORDER — HYDROCODONE-ACETAMINOPHEN 5-325 MG PO TABS
1.0000 | ORAL_TABLET | Freq: Four times a day (QID) | ORAL | 0 refills | Status: DC | PRN
Start: 2021-05-04 — End: 2021-06-30
  Filled 2021-05-04: qty 30, 8d supply, fill #0

## 2021-05-17 ENCOUNTER — Other Ambulatory Visit: Payer: Self-pay | Admitting: Family Medicine

## 2021-05-18 ENCOUNTER — Encounter: Payer: Self-pay | Admitting: Internal Medicine

## 2021-05-18 ENCOUNTER — Other Ambulatory Visit (HOSPITAL_COMMUNITY): Payer: Self-pay

## 2021-05-18 ENCOUNTER — Ambulatory Visit: Payer: BC Managed Care – PPO | Admitting: Internal Medicine

## 2021-05-18 VITALS — BP 120/72 | HR 78 | Ht 59.5 in | Wt 130.0 lb

## 2021-05-18 DIAGNOSIS — R6889 Other general symptoms and signs: Secondary | ICD-10-CM

## 2021-05-18 DIAGNOSIS — N958 Other specified menopausal and perimenopausal disorders: Secondary | ICD-10-CM

## 2021-05-18 DIAGNOSIS — M859 Disorder of bone density and structure, unspecified: Secondary | ICD-10-CM

## 2021-05-18 LAB — TSH: TSH: 0.69 u[IU]/mL (ref 0.35–5.50)

## 2021-05-18 LAB — VITAMIN D 25 HYDROXY (VIT D DEFICIENCY, FRACTURES): VITD: 65.7 ng/mL (ref 30.00–100.00)

## 2021-05-18 LAB — T4, FREE: Free T4: 0.99 ng/dL (ref 0.60–1.60)

## 2021-05-18 MED ORDER — PANTOPRAZOLE SODIUM 20 MG PO TBEC
20.0000 mg | DELAYED_RELEASE_TABLET | Freq: Every day | ORAL | 0 refills | Status: DC
Start: 2021-05-18 — End: 2021-08-15
  Filled 2021-05-18: qty 90, 90d supply, fill #0

## 2021-05-18 NOTE — Progress Notes (Signed)
? ? ?Name: Anne Garrett  ?MRN/ DOB: [id number], [birth year]    ?Age/ Sex: 46 y.o., female   ? ?PCP: [name], MD   ?Reason for Endocrinology Evaluation: Low bone density   ?   ?Date of Initial Endocrinology Evaluation: 09/25/2020   ? ? ?HPI: ?[name] is a 46 y.o. female with a past medical history of Low bone density , RA, secondary adrenal insufficiency due to chronic steroid use The patient presented for initial endocrinology clinic visit on 09/25/2020 for consultative assistance with her Osteoporosis .  ? ?Pt was diagnosed with low bone density: 2018 ? ?Menarche at age : 30-15 ?Fracture Hx: right femur , s/p simple  fall  ?Hx of HRT: no , just COC's  ?FH of osteoporosis or hip fracture: no ?Prior Hx of anti-estrogenic therapy : no ?Prior Hx of anti-resorptive therapy : In her 20's she was on calcitonin, fosamax, forteo ( caused muscle pains - a couple of month) , Boniva  was on 5 yrs ( have been off Boniva for a yr )  ? ? ? ? ?She sees Rheumatology - Dr. Annamaria Boots  ?Prednisone 5 mg daily  ? ? ? ? ? ?SUBJECTIVE:  ? ? ? ?Today (05/18/21): [name] is here for follow-up on low bone density. ? ?Denies recent falls  ?Denies bone fractures  ?She is S/P bilateral knee replacement  ?She has noted right neck swelling for 6 weeks, denies pain but has chronic stable dysphagia  ? ?She has chronic diarrhea  ?Has nausea and heartburn , on PPI  ? ?She continue to take prednisone 5 mg daily  ?She uses Aleve as needed  ? ? ?Calcium 1200 mg daily ?Vitamin D 2000 iu daily  ? ? ? ? ? ? ?HISTORY:  ?Past Medical History:  ?Past Medical History:  ?Diagnosis Date  ? Adrenal insufficiency ([place])   ? secondary to Prednisone  ? Eczema   ? coner oif eye lid  ? Headache   ? migraines  ? Intervertebral disk disease   ? Juvenile idiopathic arthritis ([place])   ? LGSIL (low grade squamous intraepithelial dysplasia) 08/20/2013  ? Formatting of this note might be different from the original. On pap smear June 2015.   HR HPV  positive.  Colposcopy and cervical AND vaginal biopsies done Aug 2015  ? Osteoporosis   ? Rheumatoid arthritis ([place])   ? Juvenile onset  ? ?Past Surgical History:  ?Past Surgical History:  ?Procedure Laterality Date  ? ANKLE ARTHROPLASTY Right   ? ANTERIOR CERVICAL DECOMP/DISCECTOMY FUSION N/A 06/19/2015  ? Procedure: C3-4 C4-5 Anterior cervical decompression/diskectomy/fusion;  Surgeon: Jovita Gamma, MD;  Location: [place] [facility];  Service: Neurosurgery;  Laterality: N/A;  C3-4 C4-5 Anterior cervical decompression/diskectomy/fusion  ? APPENDECTOMY    ? CARPAL TUNNEL RELEASE Right   ? CESAREAN SECTION    ? CHOLECYSTECTOMY    ? CHOLECYSTECTOMY  2006  ? COLONOSCOPY    ? ESSURE TUBAL LIGATION    ? Essure Implant-Permanent birth control  ? excision to palm Left   ? excision of mass to right plam  ? FOOT ARTHRODESIS Left 03/01/2019  ? Procedure: Left talonavicular and naviculocuneiform arthrodesis;  Surgeon: [name], MD;  Location: [place];  Service: Orthopedics;  Laterality: Left;  ? HARDWARE REMOVAL Right 02/02/2017  ? Procedure: RIGHT WRIST DEEP IMPLANT REMOVAL, RIGHT WRIST AND HAND EXTENSOR TENDON RECONSTRUCTION, TENDON TRANSFER AND RECONSTRUCTION AS INDICATED;  Surgeon: Iran Planas, MD;  Location: [place];  Service: Orthopedics;  Laterality: Right;  ? IRRIGATION AND DEBRIDEMENT SHOULDER Right 11/13/2019  ? Procedure: IRRIGATION AND DEBRIDEMENT RIGHT SHOULDER WITH WOUND CLOSURE;  Surgeon: [name], MD;  Location: [place];  Service: Orthopedics;  Laterality: Right;  67mn  ? JOINT REPLACEMENT Bilateral   ? bilat hip replacements  ? KNEE ARTHROPLASTY Right 08/25/2020  ? Procedure: COMPUTER ASSISTED TOTAL KNEE ARTHROPLASTY;  Surgeon: AGaynelle Arabian MD;  [facility];  Service: Orthopedics;  Laterality: Right;  ? ORIF FEMUR FRACTURE Right   ? OTHER SURGICAL HISTORY  06/04/2016  ? Revision of PLIF  ? plif l5-s1 arthrodeisis    2018  ? POSTERIOR CERVICAL FUSION/FORAMINOTOMY N/A 07/12/2015  ?  Procedure: C2 to C5 Cervical laminectomy, C2 to C5 posterior cervical arthrodesis with instrumentation and bone graft;  Surgeon: RJovita Gamma MD;  [facility];  Service: Neurosurgery;  Laterality: N/A;  C2 to C5 Cervical laminectomy, C2  to C5 posterior cervical arthrodesis with instrumentation and bone graft  ? REPAIR EXTENSOR TENDON Right 02/02/2017  ? Procedure: REPAIR EXTENSOR TENDON;  Surgeon: OIran Planas MD;  [place]  Service: Orthopedics;  Laterality: Right;  ? REVERSE SHOULDER ARTHROPLASTY Left 09/07/2018  ? Procedure: REVERSE SHOULDER ARTHROPLASTY;  Surgeon: SJustice Britain MD;  [facility];  Service: Orthopedics;  Laterality: Left;  ? REVERSE SHOULDER ARTHROPLASTY Right 09/20/2019  ? Procedure: REVERSE SHOULDER ARTHROPLASTY;  Surgeon: SJustice Britain MD;  [facility];  Service: Orthopedics;  Laterality: Right;  1211m  ? REVISION TOTAL SHOULDER TO REVERSE TOTAL SHOULDER Right 11/01/2019  ? Procedure: revision right total shoulder to reverse total shoulder;  Surgeon: SuJustice BritainMD;  [facility];  Service: Orthopedics;  Laterality: Right;  exparel block  ? right foot surgery Right   ? right jfoot arthrodesis and right ankle arthroplasty  ? TOTAL KNEE ARTHROPLASTY Left 04/21/2020  ? Procedure: TOTAL KNEE ARTHROPLASTY;  Surgeon: AlGaynelle ArabianMD;  [facility];  Service: Orthopedics;  Laterality: Left;  5062m ? WRIST SURGERY Bilateral   ? arthrodesis  ?  ?Social History:  reports that she quit smoking about 5 years ago. Her smoking use included cigarettes. She has a 5.00 pack-year smoking history. She has been exposed to tobacco smoke. She has never used smokeless tobacco. She reports current alcohol use of about 2.0 standard drinks per week. She reports that she does not use drugs. ?Family History: family history includes Breast cancer in her maternal aunt; Breast cancer (age of onset: 45)49n her mother; Diabetes in her daughter; Epilepsy in her father; Multiple  sclerosis in her maternal uncle; Nephritis in her father; Psoriasis in her paternal aunt. ? ? ?HOME MEDICATIONS: ?Allergies as of 05/18/2021   ? ?   Reactions  ? Sulfa Antibiotics Itching, Rash  ? Head to toe  ? Teriparatide Other (See Comments)  ? Severe Muscle Pain, Forteo  ? Valproic Acid And Related Rash  ? Head to toe  ? Cyclobenzaprine Swelling  ? Left foot swelling  ? Omeprazole Diarrhea  ? Severe diarrhea   ? Plaquenil [hydroxychloroquine] Diarrhea  ? Celexa [citalopram] Diarrhea  ?   ? Diflucan [fluconazole] Rash  ? Fixed drug eruption  ? Divalproex Sodium Rash  ? Leflunomide   ? Neuropathy in both feet  ? Paroxetine Hcl Diarrhea  ? Zoloft [sertraline] Diarrhea  ? ?  ? ?  ?Medication List  ?  ? ?  ? Accurate as of May 18, 2021  7:30 AM. If you have any  questions, ask your nurse or doctor.  ?  ?  ? ?  ? ?Actemra ACTPen 162 MG/0.9ML Soaj ?Generic drug: Tocilizumab ?AS DIRECTED SUBCUTANEOUS WEEKLY. ?  ?amoxicillin 500 MG capsule ?Commonly known as: AMOXIL ?Take 4 capsules (2,000 mg total) by mouth 1 hour before dental appointment. ?What changed:  ?when to take this ?additional instructions ?  ?BIOTIN PO ?Take 1 tablet by mouth daily. ?  ?butalbital-acetaminophen-caffeine 50-325-40 MG tablet ?Commonly known as: FIORICET ?Take 1 tablet by mouth every 6  hours as needed for headache. ?  ?CALCIUM+D3 PO ?Take 1 tablet by mouth daily. ?  ?HYDROcodone-acetaminophen 5-325 MG tablet ?Commonly known as: NORCO/VICODIN ?Take 1 tablet by mouth every 6 (six) hours as needed. ?  ?pantoprazole 20 MG tablet ?Commonly known as: Protonix ?Take 1 tablet (20 mg total) by mouth daily. ?  ?predniSONE 5 MG tablet ?Commonly known as: DELTASONE ?Take 1-2 tablets (5-10 mg total) by mouth daily. ?  ?PRESCRIPTION MEDICATION ?Apply 1 application topically See admin instructions. Apply dime-size amount to face 1-2 TIMES per day as needed rosacea  ? ?Metronidazole 1% + Ivermectin 1% + Azelaic Acid 15% ?  ?traMADol 50 MG tablet ?Commonly known  as: ULTRAM ?Take 1-2 tablets (50-100 mg total) by mouth every 8 (eight) hours as needed for pain ?  ?triamcinolone cream 0.1 % ?Commonly known as: KENALOG ?Apply 1 application topically 2 (two) times daily as ne

## 2021-05-18 NOTE — Patient Instructions (Signed)
Calcium 1200 mg daily  ?Vitamin D 2000 iu daily  ? ? ?[facility] ?

## 2021-05-19 ENCOUNTER — Encounter: Payer: Self-pay | Admitting: Family Medicine

## 2021-05-19 ENCOUNTER — Ambulatory Visit: Payer: BC Managed Care – PPO | Admitting: Family Medicine

## 2021-05-19 VITALS — BP 120/80 | HR 85 | Temp 98.0°F | Ht 59.5 in | Wt 129.2 lb

## 2021-05-19 DIAGNOSIS — R221 Localized swelling, mass and lump, neck: Secondary | ICD-10-CM | POA: Diagnosis not present

## 2021-05-19 LAB — BASIC METABOLIC PANEL
BUN: 13 mg/dL (ref 6–23)
CO2: 21 mEq/L (ref 19–32)
Calcium: 8.9 mg/dL (ref 8.4–10.5)
Chloride: 106 mEq/L (ref 96–112)
Creatinine, Ser: 0.67 mg/dL (ref 0.40–1.20)
GFR: 103.71 mL/min (ref >60.00)
Glucose, Bld: 93 mg/dL (ref 70–99)
Potassium: 4.2 mEq/L (ref 3.5–5.1)
Sodium: 138 mEq/L (ref 135–145)

## 2021-05-19 LAB — PARATHYROID HORMONE, INTACT (NO CA): PTH: 39 pg/mL (ref 16–77)

## 2021-05-19 LAB — ALBUMIN: Albumin: 4.5 g/dL (ref 3.5–5.2)

## 2021-05-19 NOTE — Patient Instructions (Addendum)
It was very nice to see you today! ? ?CT ordered.   Let me know if any changes ? ? ?PLEASE NOTE: ? ?If you had any lab tests please let us know if you have not heard back within a few days. You may see your results on MyChart before we have a chance to review them but we will give you a call once they are reviewed by [place]. If we ordered any referrals today, please let us know if you have not heard from their office within the next week.  ? ?Please try these tips to maintain a healthy lifestyle: ? ?Eat most of your calories during the day when you are active. Eliminate processed foods including packaged sweets (pies, cakes, cookies), reduce intake of potatoes, white bread, white pasta, and white rice. Look for whole grain options, oat flour or almond flour. ? ?Each meal should contain half fruits/vegetables, one quarter protein, and one quarter carbs (no bigger than a computer mouse). ? ?Cut down on sweet beverages. This includes juice, soda, and sweet tea. Also watch fruit intake, though this is a healthier sweet option, it still contains natural sugar! Limit to 3 servings daily. ? ?Drink at least 1 glass of water with each meal and aim for at least 8 glasses per day ? ?Exercise at least 150 minutes every week.   ?

## 2021-05-19 NOTE — Progress Notes (Signed)
? ?Subjective:  ? ? ? Patient ID: Anne Garrett, female    DOB: 26-Oct-1975, 46 y.o.   MRN: [id number] ? ?Chief Complaint  ?Patient presents with  ? Cough  ?  Random coughing/choking that started months ago  ? Bulge  ?  Knot on right side of neck, noticed 6-8 weeks ago, has pain in the base of the neck ?  ? ? ?HPI ?Cough/choking for sev months-on R side-"choking on "spit" when hits back of throat.  Not w/food.  Going on for 1 yr.  FB sens in throat on R.  Saw ENT-did laryngoscopy and told prob d/t GERD-so on PPI-that improved(FB), but still continues w/"sensation" something on R throat/neck. ?Bulge/knot R side of neck for 6-8 wks.  Saw endo yesterday and said thyroid is fine. Some cramping in base of skull on R skull/neck-heat helps some.   No f/c/wt loss/no blood.  ? ?Did have C spine surg.- this is different. ? ? ?Anemia-usu from surgeries-saw rheum w/in 8 wks.   ? ?Health Maintenance Due  ?Topic Date Due  ? HIV Screening  Never done  ? Hepatitis C Screening  Never done  ? COLONOSCOPY (Pts 45-87yr Insurance coverage will need to be confirmed)  06/13/2018  ? ? ?Past Medical History:  ?Diagnosis Date  ? Adrenal insufficiency ([facility]   ? secondary to Prednisone  ? Eczema   ? coner oif eye lid  ? Headache   ? migraines  ? Intervertebral disk disease   ? Juvenile idiopathic arthritis ([place]   ? LGSIL (low grade squamous intraepithelial dysplasia) 08/20/2013  ? Formatting of this note might be different from the original. On pap smear June 2015.   HR HPV positive.  Colposcopy and cervical AND vaginal biopsies done Aug 2015  ? Osteoporosis   ? Rheumatoid arthritis ([place]   ? Juvenile onset  ? ? ?Past Surgical History:  ?Procedure Laterality Date  ? ANKLE ARTHROPLASTY Right   ? ANTERIOR CERVICAL DECOMP/DISCECTOMY FUSION N/A 06/19/2015  ? Procedure: C3-4 C4-5 Anterior cervical decompression/diskectomy/fusion;  Surgeon: RJovita Gamma MD;  [facility];  Service: Neurosurgery;  Laterality: N/A;  C3-4 C4-5 Anterior  cervical decompression/diskectomy/fusion  ? APPENDECTOMY    ? CARPAL TUNNEL RELEASE Right   ? CESAREAN SECTION    ? CHOLECYSTECTOMY    ? CHOLECYSTECTOMY  2006  ? COLONOSCOPY    ? ESSURE TUBAL LIGATION    ? Essure Implant-Permanent birth control  ? excision to palm Left   ? excision of mass to right plam  ? FOOT ARTHRODESIS Left 03/01/2019  ? Procedure: Left talonavicular and naviculocuneiform arthrodesis;  Surgeon: HWylene Simmer MD;  [facility]  Service: Orthopedics;  Laterality: Left;  ? HARDWARE REMOVAL Right 02/02/2017  ? Procedure: RIGHT WRIST DEEP IMPLANT REMOVAL, RIGHT WRIST AND HAND EXTENSOR TENDON RECONSTRUCTION, TENDON TRANSFER AND RECONSTRUCTION AS INDICATED;  Surgeon: OIran Planas MD;  [facility]  Service: Orthopedics;  Laterality: Right;  ? IRRIGATION AND DEBRIDEMENT SHOULDER Right 11/13/2019  ? Procedure: IRRIGATION AND DEBRIDEMENT RIGHT SHOULDER WITH WOUND CLOSURE;  Surgeon: SJustice Britain MD;  [facility]  Service: Orthopedics;  Laterality: Right;  930m  ? JOINT REPLACEMENT Bilateral   ? bilat hip replacements  ? KNEE ARTHROPLASTY Right 08/25/2020  ? Procedure: COMPUTER ASSISTED TOTAL KNEE ARTHROPLASTY;  Surgeon: AlGaynelle ArabianMD;  [facility];  Service: Orthopedics;  Laterality: Right;  ? ORIF FEMUR FRACTURE Right   ? OTHER SURGICAL HISTORY  06/04/2016  ? Revision of  PLIF  ? plif l5-s1 arthrodeisis    2018  ? POSTERIOR CERVICAL FUSION/FORAMINOTOMY N/A 07/12/2015  ? Procedure: C2 to C5 Cervical laminectomy, C2 to C5 posterior cervical arthrodesis with instrumentation and bone graft;  Surgeon: Jovita Gamma, MD;  Location: [facility];  Service: Neurosurgery;  Laterality: N/A;  C2 to C5 Cervical laminectomy, C2  to C5 posterior cervical arthrodesis with instrumentation and bone graft  ? REPAIR EXTENSOR TENDON Right 02/02/2017  ? Procedure: REPAIR EXTENSOR TENDON;  Surgeon: Iran Planas, MD;  Location: [facility];  Service: Orthopedics;  Laterality: Right;  ?  REVERSE SHOULDER ARTHROPLASTY Left 09/07/2018  ? Procedure: REVERSE SHOULDER ARTHROPLASTY;  Surgeon: [name], MD;  [facility];  Service: Orthopedics;  Laterality: Left;  ? REVERSE SHOULDER ARTHROPLASTY Right 09/20/2019  ? Procedure: REVERSE SHOULDER ARTHROPLASTY;  Surgeon: [name], MD;  [facility];  Service: Orthopedics;  Laterality: Right;  114mn  ? REVISION TOTAL SHOULDER TO REVERSE TOTAL SHOULDER Right 11/01/2019  ? Procedure: revision right total shoulder to reverse total shoulder;  Surgeon: SJustice Britain MD;  [facility];  Service: Orthopedics;  Laterality: Right;  exparel block  ? right foot surgery Right   ? right jfoot arthrodesis and right ankle arthroplasty  ? TOTAL KNEE ARTHROPLASTY Left 04/21/2020  ? Procedure: TOTAL KNEE ARTHROPLASTY;  Surgeon: AGaynelle Arabian MD;  [facility];  Service: Orthopedics;  Laterality: Left;  548m  ? WRIST SURGERY Bilateral   ? arthrodesis  ? ? ?Outpatient Medications Prior to Visit  ?Medication Sig Dispense Refill  ? amoxicillin (AMOXIL) 500 MG capsule Take 4 capsules (2,000 mg total) by mouth 1 hour before dental appointment. (Patient taking differently: Take 2,000 mg by mouth See admin instructions. Take 2000 mg 1 hour prior to dental work) 8 capsule 0  ? BIOTIN PO Take 1 tablet by mouth daily.    ? butalbital-acetaminophen-caffeine (FIORICET) 50-325-40 MG tablet Take 1 tablet by mouth every 6  hours as needed for headache. 30 tablet 0  ? Calcium Carb-Cholecalciferol (CALCIUM+D3 PO) Take 1 tablet by mouth daily.    ? Cholecalciferol (VITAMIN D3) 50 MCG (2000 UT) TABS Take 2,000 Units by mouth daily.    ? HYDROcodone-acetaminophen (NORCO/VICODIN) 5-325 MG tablet Take 1 tablet by mouth every 6 (six) hours as needed. 30 tablet 0  ? pantoprazole (PROTONIX) 20 MG tablet Take 1 tablet (20 mg total) by mouth daily. 90 tablet 0  ? PRESCRIPTION MEDICATION Apply 1 application topically See admin instructions. Apply dime-size amount to face 1-2 TIMES  per day as needed rosacea  ? ?Metronidazole 1% + Ivermectin 1% + Azelaic Acid 15%    ? Tocilizumab (ACTEMRA ACTPEN) 162 MG/0.9ML SOAJ AS DIRECTED SUBCUTANEOUS WEEKLY. 3.6 mL 2  ? traMADol (ULTRAM) 50 MG tablet Take 1-2 tablets (50-100 mg total) by mouth every 8 (eight) hours as needed for pain 120 tablet 1  ? triamcinolone cream (KENALOG) 0.1 % Apply 1 application topically 2 (two) times daily as needed. (Patient taking differently: Apply 1 application. topically 2 (two) times daily as needed (eczema).) 15 g 1  ? predniSONE (DELTASONE) 5 MG tablet Take 1-2 tablets (5-10 mg total) by mouth daily. (Patient not taking: Reported on 05/19/2021) 180 tablet 1  ? ?No facility-administered medications prior to visit.  ? ? ?Allergies  ?Allergen Reactions  ? Sulfa Antibiotics Itching and Rash  ?  Head to toe  ? Teriparatide Other (See Comments)  ?  Severe Muscle Pain, [place]  ? Valproic Acid And Related Rash  ?  Head to toe  ? Cyclobenzaprine Swelling  ?  Left foot swelling  ? Omeprazole Diarrhea  ?  Severe diarrhea   ? Plaquenil [Hydroxychloroquine] Diarrhea  ? Celexa [Citalopram] Diarrhea  ?   ?  ? Diflucan [Fluconazole] Rash  ?  Fixed drug eruption  ? Divalproex Sodium Rash  ? Leflunomide   ?  Neuropathy in both feet  ? Paroxetine Hcl Diarrhea  ? Zoloft [Sertraline] Diarrhea  ? ?ROS neg/noncontributory except as noted HPI/below ? ? ?   ?Objective:  ?  ? ?BP 120/80   Pulse 85   Temp 98 ?F (36.7 ?C) (Temporal)   Ht 4' 11.5" (1.511 m)   Wt 129 lb 4 oz (58.6 kg)   LMP 05/08/2021 (Exact Date)   SpO2 98%   BMI 25.67 kg/m?  ?Wt Readings from Last 3 Encounters:  ?05/19/21 129 lb 4 oz (58.6 kg)  ?05/18/21 130 lb (59 kg)  ?09/25/20 116 lb 6.4 oz (52.8 kg)  ? ? ?Physical Exam  ? ?Gen: WDWN NAD  ?HEENT: NCAT, conjunctiva not injected, sclera nonicteric ?TM WNL B, OP moist, no exudates  ?NECK:  supple, no thyromegaly, + prominent R submand node-still <1cm, non-tender but feels in throat.  Some firmness on R ant neck as well.  ?  Facet hypertrophy.  no carotid bruits ?CARDIAC: RRR, S1S2+, no murmur. EXT:  no edema ?MSK: hands and feet-RA ?NEURO: A&O x3.  CN II-XII intact.  ?PSYCH: normal mood. Good eye contact ? ?03/13/21 wbc 6.6 hgb 11.6 pl

## 2021-05-20 ENCOUNTER — Ambulatory Visit: Payer: BC Managed Care – PPO | Admitting: Family Medicine

## 2021-05-25 ENCOUNTER — Ambulatory Visit: Payer: 59 | Admitting: Internal Medicine

## 2021-05-25 ENCOUNTER — Other Ambulatory Visit (HOSPITAL_COMMUNITY): Payer: Self-pay

## 2021-05-25 MED ORDER — AMOXICILLIN 500 MG PO CAPS
2000.0000 mg | ORAL_CAPSULE | ORAL | 1 refills | Status: AC
  Filled 2021-05-25: qty 8, 2d supply, fill #0
  Filled 2022-02-06: qty 8, 2d supply, fill #1

## 2021-06-04 ENCOUNTER — Ambulatory Visit
Admission: RE | Admit: 2021-06-04 | Discharge: 2021-06-04 | Disposition: A | Discharge status: Home / Self Care | Payer: BC Managed Care – PPO | Source: Ambulatory Visit | Attending: Family Medicine | Admitting: Family Medicine

## 2021-06-04 DIAGNOSIS — M2669 Other specified disorders of temporomandibular joint: Secondary | ICD-10-CM | POA: Diagnosis not present

## 2021-06-04 DIAGNOSIS — Z981 Arthrodesis status: Secondary | ICD-10-CM | POA: Diagnosis not present

## 2021-06-04 DIAGNOSIS — M47812 Spondylosis without myelopathy or radiculopathy, cervical region: Secondary | ICD-10-CM | POA: Diagnosis not present

## 2021-06-04 DIAGNOSIS — E041 Nontoxic single thyroid nodule: Secondary | ICD-10-CM | POA: Diagnosis not present

## 2021-06-04 DIAGNOSIS — R221 Localized swelling, mass and lump, neck: Secondary | ICD-10-CM

## 2021-06-04 MED ORDER — IOPAMIDOL (ISOVUE-300) INJECTION 61%
75.0000 mL | Freq: Once | INTRAVENOUS | Status: AC | PRN
  Administered 2021-06-04: 75 mL via INTRAVENOUS

## 2021-06-10 DIAGNOSIS — Z79899 Other long term (current) drug therapy: Secondary | ICD-10-CM | POA: Diagnosis not present

## 2021-06-10 DIAGNOSIS — M0579 Rheumatoid arthritis with rheumatoid factor of multiple sites without organ or systems involvement: Secondary | ICD-10-CM | POA: Diagnosis not present

## 2021-06-10 DIAGNOSIS — M810 Age-related osteoporosis without current pathological fracture: Secondary | ICD-10-CM | POA: Diagnosis not present

## 2021-06-10 DIAGNOSIS — M2550 Pain in unspecified joint: Secondary | ICD-10-CM | POA: Diagnosis not present

## 2021-06-12 ENCOUNTER — Other Ambulatory Visit: Payer: BC Managed Care – PPO

## 2021-06-22 ENCOUNTER — Other Ambulatory Visit: Payer: Self-pay

## 2021-06-22 ENCOUNTER — Encounter (HOSPITAL_COMMUNITY): Payer: Self-pay

## 2021-06-22 ENCOUNTER — Emergency Department (HOSPITAL_COMMUNITY): Payer: BC Managed Care – PPO

## 2021-06-22 ENCOUNTER — Emergency Department (HOSPITAL_COMMUNITY)
Admission: EM | Admit: 2021-06-22 | Discharge: 2021-06-22 | Disposition: A | Discharge status: Home / Self Care | Payer: BC Managed Care – PPO | Attending: Emergency Medicine | Admitting: Emergency Medicine

## 2021-06-22 DIAGNOSIS — G9389 Other specified disorders of brain: Secondary | ICD-10-CM | POA: Diagnosis not present

## 2021-06-22 DIAGNOSIS — Z981 Arthrodesis status: Secondary | ICD-10-CM | POA: Diagnosis not present

## 2021-06-22 DIAGNOSIS — R519 Headache, unspecified: Secondary | ICD-10-CM | POA: Diagnosis not present

## 2021-06-22 DIAGNOSIS — M5136 Other intervertebral disc degeneration, lumbar region: Secondary | ICD-10-CM | POA: Diagnosis not present

## 2021-06-22 DIAGNOSIS — M4802 Spinal stenosis, cervical region: Secondary | ICD-10-CM | POA: Diagnosis not present

## 2021-06-22 DIAGNOSIS — R200 Anesthesia of skin: Secondary | ICD-10-CM | POA: Diagnosis not present

## 2021-06-22 DIAGNOSIS — M2669 Other specified disorders of temporomandibular joint: Secondary | ICD-10-CM | POA: Diagnosis not present

## 2021-06-22 DIAGNOSIS — M542 Cervicalgia: Secondary | ICD-10-CM | POA: Diagnosis not present

## 2021-06-22 DIAGNOSIS — R202 Paresthesia of skin: Secondary | ICD-10-CM | POA: Diagnosis not present

## 2021-06-22 DIAGNOSIS — R42 Dizziness and giddiness: Secondary | ICD-10-CM | POA: Diagnosis not present

## 2021-06-22 DIAGNOSIS — G238 Other specified degenerative diseases of basal ganglia: Secondary | ICD-10-CM | POA: Diagnosis not present

## 2021-06-22 LAB — BASIC METABOLIC PANEL
Anion gap: 9 (ref 5–15)
BUN: 7 mg/dL (ref 6–20)
CO2: 24 mmol/L (ref 22–32)
Calcium: 8.9 mg/dL (ref 8.9–10.3)
Chloride: 106 mmol/L (ref 98–111)
Creatinine, Ser: 0.6 mg/dL (ref 0.44–1.00)
GFR, Estimated: >60 mL/min (ref >60)
Glucose, Bld: 97 mg/dL (ref 70–99)
Potassium: 4 mmol/L (ref 3.5–5.1)
Sodium: 139 mmol/L (ref 135–145)

## 2021-06-22 LAB — CBC
HCT: 38.1 % (ref 36.0–46.0)
Hemoglobin: 12.5 g/dL (ref 12.0–15.0)
MCH: 33.5 pg (ref 26.0–34.0)
MCHC: 32.8 g/dL (ref 30.0–36.0)
MCV: 102.1 fL — ABNORMAL HIGH (ref 80.0–100.0)
Platelets: 304 10*3/uL (ref 150–400)
RBC: 3.73 MIL/uL — ABNORMAL LOW (ref 3.87–5.11)
RDW: 13.6 % (ref 11.5–15.5)
WBC: 7.5 10*3/uL (ref 4.0–10.5)
nRBC: 0 % (ref 0.0–0.2)

## 2021-06-22 LAB — URINALYSIS, ROUTINE W REFLEX MICROSCOPIC
Bilirubin Urine: NEGATIVE
Glucose, UA: NEGATIVE mg/dL
Hgb urine dipstick: NEGATIVE
Ketones, ur: NEGATIVE mg/dL
Leukocytes,Ua: NEGATIVE
Nitrite: NEGATIVE
Protein, ur: NEGATIVE mg/dL
Specific Gravity, Urine: 1.003 — ABNORMAL LOW (ref 1.005–1.030)
pH: 7 (ref 5.0–8.0)

## 2021-06-22 MED ORDER — PREDNISONE 20 MG PO TABS
60.0000 mg | ORAL_TABLET | Freq: Once | ORAL | Status: AC
  Administered 2021-06-22: 60 mg via ORAL
  Filled 2021-06-22: qty 3

## 2021-06-22 MED ORDER — PREDNISONE 50 MG PO TABS: ORAL_TABLET | ORAL | 0 refills | Status: DC

## 2021-06-22 MED ORDER — OXYCODONE-ACETAMINOPHEN 5-325 MG PO TABS
1.0000 | ORAL_TABLET | Freq: Once | ORAL | Status: AC
  Administered 2021-06-22: 1 via ORAL
  Filled 2021-06-22: qty 1

## 2021-06-22 MED ORDER — OXYCODONE-ACETAMINOPHEN 5-325 MG PO TABS: 1.0000 | ORAL_TABLET | Freq: Once | ORAL | Status: DC

## 2021-06-22 MED ORDER — IOHEXOL 350 MG/ML SOLN
72.0000 mL | Freq: Once | INTRAVENOUS | Status: AC | PRN
  Administered 2021-06-22: 72 mL via INTRAVENOUS

## 2021-06-22 NOTE — ED Provider Notes (Signed)
[facility] EMERGENCY DEPARTMENT Provider Note   CSN: [id number] Arrival date & time: 06/22/21  1128     History {Add pertinent medical, surgical, social history, OB history to HPI:1} Chief Complaint  Patient presents with   Numbness    Anne Garrett is a 46 y.o. female.  Patient with a history of rheumatoid arthritis status post multiple orthopedic surgeries including C-spine and lumbar spine fusion presenting with bilateral numbness and tingling to her lower extremities.  This has been ongoing for the past 2 weeks but getting worse.  Over the past 2 weeks she had pins and needle sensation to her bilateral feet extending up to her knees.  About 4 days ago the numbness extended up to her bilateral thighs as well.  She endorses pins-and-needles feeling but no weakness.  She feels that is the same on both sides.  No bowel or bladder incontinence.  No fever or vomiting.  No chest pain or shortness of breath. She is also complaining of burning pain to the right side of her neck that has been an issue for several months as well.  She saw her PCP and they did a CT scan for foreign body sensation which showed that she had a large osteophyte that could be causing her to have dysphagia.  Over the past several days she has had a burning pain to her right side of her head that radiates up to her neck and she feels like it shoots up to her skull.  This pain comes and goes lasting for several minutes to hours at a time.  In particular she reports that sharp burning pain wakes her from sleep the past 3 nights with pain behind her right ear that radiates up the back of her skull but not down her arm.  This pain is sharp and burning and last for several minutes to hours at a time.  It does not occur during the day.  No new numbness to her arms.  No difficulty speaking or difficulty swallowing.  No fevers or vomiting. She feels the numbness in her legs becomes stronger when she turns her  head.  The history is provided by the patient.       Home Medications Prior to Admission medications   Medication Sig Start Date End Date Taking? Authorizing Provider  amoxicillin (AMOXIL) 500 MG capsule Take 4 capsules (2,000 mg total) by mouth 1 hour before dental appointment. Patient taking differently: Take 2,000 mg by mouth See admin instructions. Take 2000 mg 1 hour prior to dental work 07/28/20     amoxicillin (AMOXIL) 500 MG capsule Take 4 capsules (2,000 mg total) by mouth 1 hour before dental appt for pre-med 05/25/21   Jillyn Hidden, DMD  BIOTIN PO Take 1 tablet by mouth daily.    [provider]  butalbital-acetaminophen-caffeine (FIORICET) [phone] MG tablet Take 1 tablet by mouth every 6  hours as needed for headache. 04/21/21   [name], MD  Calcium Carb-Cholecalciferol (CALCIUM+D3 PO) Take 1 tablet by mouth daily.    [provider]  Cholecalciferol (VITAMIN D3) 50 MCG (2000 UT) TABS Take 2,000 Units by mouth daily.    [provider]  HYDROcodone-acetaminophen (NORCO/VICODIN) 5-325 MG tablet Take 1 tablet by mouth every 6 (six) hours as needed. 05/04/21     pantoprazole (PROTONIX) 20 MG tablet Take 1 tablet (20 mg total) by mouth daily. 05/18/21   [name], MD  predniSONE (DELTASONE) 5 MG tablet Take  1-2 tablets (5-10 mg total) by mouth daily. 02/16/21     PRESCRIPTION MEDICATION Apply 1 application topically See admin instructions. Apply dime-size amount to face 1-2 TIMES per day as needed rosacea   Metronidazole 1% + Ivermectin 1% + Azelaic Acid 15%    [provider]  Tocilizumab (ACTEMRA ACTPEN) 162 MG/0.9ML SOAJ AS DIRECTED SUBCUTANEOUS WEEKLY. 11/04/20   Tresa Garter, MD  traMADol (ULTRAM) 50 MG tablet Take 1-2 tablets (50-100 mg total) by mouth every 8 (eight) hours as needed for pain 10/07/20     triamcinolone cream (KENALOG) 0.1 % Apply 1 application topically 2 (two) times daily as needed. Patient taking  differently: Apply 1 application. topically 2 (two) times daily as needed (eczema). 05/11/19   [name], MD      Allergies    Sulfa antibiotics, Teriparatide, Valproic acid and related, Cyclobenzaprine, Omeprazole, Plaquenil [hydroxychloroquine], Celexa [citalopram], Diflucan [fluconazole], Divalproex sodium, Leflunomide, Paroxetine hcl, and Zoloft [sertraline]    Review of Systems   Review of Systems  Constitutional:  Negative for activity change and fever.  HENT:  Negative for congestion and postnasal drip.   Respiratory:  Negative for cough, chest tightness and shortness of breath.   Cardiovascular:  Negative for chest pain.  Gastrointestinal:  Negative for abdominal pain, nausea and vomiting.  Genitourinary:  Negative for dysuria and hematuria.  Musculoskeletal:  Positive for back pain. Negative for arthralgias and myalgias.  Skin:  Negative for rash.  Neurological:  Positive for dizziness, weakness, light-headedness, numbness and headaches.   all other systems are negative except as noted in the HPI and PMH.    Physical Exam Updated Vital Signs BP 110/79 (BP Location: Right Arm)   Pulse 69   Temp 99.6 F (37.6 C)   Resp 16   Ht '4\' 11"'$  (1.499 m)   Wt 58.5 kg   SpO2 97%   BMI 26.05 kg/m  Physical Exam Vitals and nursing note reviewed.  Constitutional:      General: She is not in acute distress.    Appearance: She is well-developed.  HENT:     Head: Normocephalic and atraumatic.     Mouth/Throat:     Pharynx: No oropharyngeal exudate.  Eyes:     Conjunctiva/sclera: Conjunctivae normal.     Pupils: Pupils are equal, round, and reactive to light.  Neck:     Comments: No meningismus. R paraspinal cervical tenderness No midline tenderness Cardiovascular:     Rate and Rhythm: Normal rate and regular rhythm.     Heart sounds: Normal heart sounds. No murmur heard. Pulmonary:     Effort: Pulmonary effort is normal. No respiratory distress.     Breath sounds: Normal  breath sounds.  Abdominal:     Palpations: Abdomen is soft.     Tenderness: There is no abdominal tenderness. There is no guarding or rebound.  Musculoskeletal:        General: No tenderness. Normal range of motion.     Cervical back: Normal range of motion and neck supple.     Comments: 5/5 strength in bilateral lower extremities. Ankle plantar and dorsiflexion intact. Great toe extension intact bilaterally. +2 DP and PT pulses. +2 patellar reflexes bilaterally. Normal gait.  decreased sensation to touch to the legs bilaterally.  Skin:    General: Skin is warm.  Neurological:     Mental Status: She is alert and oriented to person, place, and time.     Cranial Nerves: No cranial nerve deficit.  Motor: No abnormal muscle tone.     Coordination: Coordination normal.     Comments:  5/5 strength throughout. CN 2-12 intact.Equal grip strength.   Psychiatric:        Behavior: Behavior normal.     ED Results / Procedures / Treatments   Labs (all labs ordered are listed, but only abnormal results are displayed) Labs Reviewed  CBC - Abnormal; Notable for the following components:      Result Value   RBC 3.73 (*)    MCV 102.1 (*)    All other components within normal limits  URINALYSIS, ROUTINE W REFLEX MICROSCOPIC - Abnormal; Notable for the following components:   Color, Urine STRAW (*)    Specific Gravity, Urine 1.003 (*)    All other components within normal limits  BASIC METABOLIC PANEL    EKG None  Radiology MR LUMBAR SPINE WO CONTRAST  Result Date: 06/22/2021 CLINICAL DATA:  Lumbar spinal stenosis. Worsening numbness in the lower extremities. Prior lumbar surgery. EXAM: MRI LUMBAR SPINE WITHOUT CONTRAST TECHNIQUE: Multiplanar, multisequence MR imaging of the lumbar spine was performed. No intravenous contrast was administered. COMPARISON:  Lumbar spine MRI 08/30/2017. And CT 07/25/2020 CT abdomen and pelvis 03/05/2019. FINDINGS: Segmentation:  Standard. Alignment:  Unchanged grade 2 anterolisthesis of L5 on S1. Mild lumbar levoscoliosis. Vertebrae: No fracture, suspicious marrow lesion, or significant marrow edema. Prior L4-S1 posterior fusion with pedicle screws remaining in place on the right at L4 and S1 and on the left at L5 and S1. Prior interbody fusion at L5-S1. Conus medullaris and cauda equina: Conus extends to the L1-2 level. Conus and cauda equina appear normal. Paraspinal and other soft tissues: Postoperative changes in the posterior lumbar soft tissues. No fluid collection. Partially visualized T2 hyperintense lesion inferiorly in the right hepatic lobe corresponding to a hemangioma on the prior abdominal CT. Chronically enlarged uterus with unchanged suspected 14 mm fibroid in the posterior aspect of the fundus. Disc levels: T12-L1 and L1-2: Negative. L2-3: Normal disc.  Mild facet arthrosis without stenosis. L3-4: Disc desiccation. Mild-to-moderate facet hypertrophy and at most minimal disc bulging without stenosis. L4-5: Disc desiccation. Moderate to advanced facet arthrosis and at most minimal disc bulging without stenosis. L5-S1: Prior posterior decompression and fusion. Anterolisthesis with slight bulging of uncovered disc, endplate spurring, and severe disc space height loss result in chronic severe spinal stenosis and severe right and mild left neural foraminal stenosis, unchanged from the prior MRI. IMPRESSION: Stable postoperative and degenerative changes in the lumbar spine, most notable at L5-S1 where there is chronic severe spinal and right neural foraminal stenosis. Electronically Signed   By: Logan Bores M.D.   On: 06/22/2021 18:15   MR Cervical Spine Wo Contrast  Result Date: 06/22/2021 CLINICAL DATA:  Cervical spinal stenosis. Worsening neurologic symptoms. Right neck pain, pain with turning the head to the right, and tingling in the neck and face. Prior cervical spine surgery. EXAM: MRI CERVICAL SPINE WITHOUT CONTRAST TECHNIQUE: Multiplanar,  multisequence MR imaging of the cervical spine was performed. No intravenous contrast was administered. COMPARISON:  Cervical spine MRI 12/31/2015 FINDINGS: Alignment: Chronic straightening of the normal cervical lordosis. Fused grade 1 anterolisthesis of C3 on C4 and C4 on C5. Vertebrae: Prior C3-C5 ACDF and C2-C5 posterior fusion. No fracture or suspicious marrow lesion. Hemangioma in the T4 vertebral body. Chronic asymmetrically severe right-sided C1-2 arthropathy. Cord: Unchanged T2 hyperintensity and volume loss in the spinal cord at C3-4. Posterior Fossa, vertebral arteries, paraspinal tissues: No cerebellar tonsillar ectopia.  Preserved vertebral artery flow voids. Postoperative changes in the posterior neck soft tissues. Disc levels: C2-3: Prior posterior fusion. Partial interbody ankylosis. No significant stenosis. C3-4: Prior posterior decompression and anterior and posterior fusion. No evidence of significant stenosis. C4-5: Prior posterior decompression and anterior and posterior fusion. No evidence of significant stenosis. C5-6: Disc bulging and facet arthrosis result in mild spinal stenosis and mild left neural foraminal stenosis, unchanged. C6-7: Asymmetric left facet arthrosis without disc herniation or stenosis. C7-T1: Left greater than right facet arthrosis without disc herniation or stenosis. T1-2: Facet arthrosis without disc herniation or stenosis. IMPRESSION: 1. Unchanged mild spinal and left neural foraminal stenosis at C5-6. 2. Prior C2-C5 fusion with unchanged myelomalacia at C3-4. 3. Asymmetrically severe right sided C1-2 arthropathy. Electronically Signed   By: Logan Bores M.D.   On: 06/22/2021 18:03    Procedures Procedures  {Document cardiac monitor, telemetry assessment procedure when appropriate:1}  Medications Ordered in ED Medications - No data to display  ED Course/ Medical Decision Making/ A&P                           Medical Decision Making Amount and/or Complexity  of Data Reviewed Independent Historian: EMS Labs: ordered. Decision-making details documented in ED Course. Radiology: ordered and independent interpretation performed. Decision-making details documented in ED Course. ECG/medicine tests: ordered and independent interpretation performed. Decision-making details documented in ED Course.  Risk Prescription drug management.  Patient with numbness to her bilateral legs for the past 2 weeks progressively worsening over the past 2 days.  Good strength, sensation and pulses and reflexes.  Consider Guillain-Barr syndrome but her strength is normal and her reflexes are normal.  MRI obtained in triage shows stable findings in her cervical and lumbar spine.  Results reviewed and interpreted by me  D/w neurology Dr. Lorrin Goodell.  He feels patient can follow-up as an outpatient for EMGs and further neurological evaluation.  She does not have any acute spinal cord emergency on MRI tonight.  Low suspicion for Guillain-Barr as she has good motor strength and is ambulatory and she has intact reflexes bilaterally.  Dr. Lorrin Goodell does not recommend lumbar puncture at this time given her good motor strength and reflexes  Findings also discussed with [facility] NP on-call for neurosurgery.  She agrees no acute spinal cord emergency tonight and course of steroids would be reasonable as well as trial of gabapentin.  Recommends follow-up in the office as well as follow-up with neurology for EMG.  Patient states she cannot tolerate gabapentin due to diarrhea.  She is agreeable to a course of steroids  {Document critical care time when appropriate:1} {Document review of labs and clinical decision tools ie heart score, Chads2Vasc2 etc:1}  {Document your independent review of radiology images, and any outside records:1} {Document your discussion with family members, caretakers, and with consultants:1} {Document social determinants of health affecting pt's  care:1} {Document your decision making why or why not admission, treatments were needed:1} Final Clinical Impression(s) / ED Diagnoses Final diagnoses:  None    Rx / DC Orders ED Discharge Orders     None

## 2021-06-22 NOTE — ED Notes (Signed)
Pt in MRI.

## 2021-06-22 NOTE — ED Triage Notes (Signed)
Patient reports progressive numbness tingling to bilateral legs that has shot up to thighs.  Reports she has called [place] neurosurgery x 2 weeks with no return phone calls.  Complains of night sided neck pain.

## 2021-06-22 NOTE — ED Provider Triage Note (Signed)
Emergency Medicine Provider Triage Evaluation Note  Anne Garrett , a 46 y.o. female  was evaluated in triage.  Pt complains of history of severe rheumatoid arthritis, previous surgery of both cervical and lumbar spines.  She endorses some right neck pain, pain with turning her head to the right with tingling of the neck and face.  She endorses worsening numbness from the feet up to the mid thighs over the last few days.  There was a known osteophyte, atlanto/axial joint dysfunction in the C-spine at last evaluation a few days ago.  She reports that she has been trying to get into see her neurosurgeon but they are not able to see her until July.  They encouraged her to show up to the emergency department if her symptoms are worsening. Some recent loose stool, increased urinary frequency. No dysuria. No saddle anesthesia, urinary or fecal retention  Review of Systems  Positive: Numbness, tingling Negative: weakness  Physical Exam  BP (!) 156/100 (BP Location: Right Arm)   Pulse 97   Temp 99.6 F (37.6 C)   Resp 18   Ht '4\' 11"'$  (1.499 m)   Wt 58.5 kg   SpO2 100%   BMI 26.05 kg/m  Gen:   Awake, no distress   Resp:  Normal effort  MSK:   Moves extremities without difficulty  Other:    Medical Decision Making  Medically screening exam initiated at 12:00 PM.  Appropriate orders placed.  Milton Streicher was informed that the remainder of the evaluation will be completed by another provider, this initial triage assessment does not replace that evaluation, and the importance of remaining in the ED until their evaluation is complete.  Workup initiated   Anselmo Pickler, [place] 06/22/21 1202

## 2021-06-22 NOTE — Discharge Instructions (Signed)
Your MRI shows stable findings with no acute nerve compression and stable postsurgical changes.  You should follow-up with both the neurosurgeon and the neurologist.  The neurologist wanted to have a test called an EMG.  Take the steroids as prescribed and then go back to your 5 mg daily. Return to the ED with increased pain, weakness, numbness, tingling, bowel or bladder incontinence, fever or any other concerns

## 2021-06-22 NOTE — ED Notes (Signed)
Patient transported to CT 

## 2021-06-30 ENCOUNTER — Encounter: Payer: Self-pay | Admitting: Neurology

## 2021-06-30 ENCOUNTER — Ambulatory Visit: Payer: BC Managed Care – PPO | Admitting: Neurology

## 2021-06-30 VITALS — BP 148/96 | HR 78 | Ht 59.5 in | Wt 132.0 lb

## 2021-06-30 DIAGNOSIS — G629 Polyneuropathy, unspecified: Secondary | ICD-10-CM | POA: Diagnosis not present

## 2021-06-30 DIAGNOSIS — M0800 Unspecified juvenile rheumatoid arthritis of unspecified site: Secondary | ICD-10-CM | POA: Diagnosis not present

## 2021-06-30 DIAGNOSIS — G959 Disease of spinal cord, unspecified: Secondary | ICD-10-CM | POA: Diagnosis not present

## 2021-06-30 DIAGNOSIS — M542 Cervicalgia: Secondary | ICD-10-CM | POA: Diagnosis not present

## 2021-06-30 DIAGNOSIS — M4316 Spondylolisthesis, lumbar region: Secondary | ICD-10-CM | POA: Diagnosis not present

## 2021-06-30 DIAGNOSIS — T380X5A Adverse effect of glucocorticoids and synthetic analogues, initial encounter: Secondary | ICD-10-CM

## 2021-06-30 DIAGNOSIS — R1310 Dysphagia, unspecified: Secondary | ICD-10-CM | POA: Diagnosis not present

## 2021-06-30 DIAGNOSIS — R200 Anesthesia of skin: Secondary | ICD-10-CM | POA: Diagnosis not present

## 2021-07-01 ENCOUNTER — Ambulatory Visit: Payer: BC Managed Care – PPO | Admitting: Neurology

## 2021-07-01 ENCOUNTER — Ambulatory Visit: Payer: BC Managed Care – PPO | Admitting: Family Medicine

## 2021-07-01 VITALS — BP 141/87 | HR 87 | Ht 59.5 in | Wt 132.0 lb

## 2021-07-01 DIAGNOSIS — R200 Anesthesia of skin: Secondary | ICD-10-CM

## 2021-07-01 DIAGNOSIS — G629 Polyneuropathy, unspecified: Secondary | ICD-10-CM

## 2021-07-01 NOTE — Procedures (Signed)
Full Name: Anne Garrett Gender: Female MRN #: [id number] Date of Birth: Jul 24, 1975    Visit Date: 07/01/2021 10:18 Age: 46 Years Examining Physician: Marcial Pacas Referring Physician: Arlice Colt Height: 4 feet 11 inch History: 46 year old female, with long history of rheumatoid arthritis, cervical, lumbar decompression surgery, bilateral knee replacement, bilateral ankle fusion surgery, presenting with 2 months history of lower extremity paresthesia, starting from bilateral foot plantar surface," fluffy sensation, as if my foot and legs are in the clouds", over the past few months, ascending to bilateral mid thigh level.  She already has some baseline bilateral finger paresthesia, had a history of bilateral wrist fusion, there was no increased gait abnormality, no upper or lower extremity weakness, no bowel or bladder incontinence  On examination, bilateral upper and lower extremity proximal muscle strength is normal, distal muscle strength is limited due to her previous wrist and ankle fusion surgery, brisk bilateral upper extremity, patellar reflex, absent ankle reflex, normal jaw jerk.  Plantar responses were flexor bilaterally, length-dependent decreased pinprick, light touch to above ankle level.  Preserved toe proprioception and vibratory sensation.  Summary of the test: Nerve conduction study: Right superficial peroneal sensory response was absent, likely related to her previous surgical scar.  Right sural sensory response was within low normal range.  Right median, ulnar sensory responses were normal.  Right tibial motor responses were normal.  Right median, ulnar motor responses showed normal distal latency, conduction velocity, with mild decreased CMAP amplitude, this could also be related to her previous right wrist surgery.    Electromyography: Selected needle examinations was performed at right upper extremity muscles, right cervical paraspinal muscles; right lower  extremity muscles and right lumbosacral paraspinal muscles.  There is evidence of chronic neuropathic changes involving right C5, C6 myotomes, there was no spontaneous activity at right cervical paraspinal muscles.  There was no significant abnormality noted at the needle examination of the right lower extremity muscles.   Conclusion: This is an abnormal study.  There is electrodiagnostic evidence of chronic right cervical radiculopathy mainly involving right C 5-6 myotomes.  There is no evidence of large fiber peripheral neuropathy, there is no evidence of active right lumbar radiculopathy.   ------------------------------- Marcial Pacas, M.D.Ph.D.  [facility] [address] [place], [place] [address] Tel: [phone] Fax: [phone]  Verbal informed consent was obtained from the patient, patient was informed of potential risk of procedure, including bruising, bleeding, hematoma formation, infection, muscle weakness, muscle pain, numbness, among others.        Crouch    Nerve / Sites Muscle Latency Ref. Amplitude Ref. Rel Amp Segments Distance Velocity Ref. Area    ms ms mV mV %  cm m/s m/s mVms  R Median - APB     Wrist APB 3.4 ?4.4 2.7 ?4.0 100 Wrist - APB 7   5.5     Upper arm APB 6.3  2.6  95.8 Upper arm - Wrist 15 53 ?49 5.9  R Ulnar - ADM     Wrist ADM 2.9 ?3.3 5.3 ?6.0 100 Wrist - ADM 7   15.2     B.Elbow ADM 4.5  4.9  93.3 B.Elbow - Wrist 9 58 ?49 15.1     A.Elbow ADM 6.5  4.2  86.4 A.Elbow - B.Elbow 12 58 ?49 13.9  R Tibial - AH     Ankle AH 4.7 ?5.8 5.7 ?4.0 100 Ankle - AH 9   10.8  Pop fossa AH 12.7  3.9  68.9 Pop fossa - Ankle 38 47 ?41 9.9           SNC    Nerve / Sites Rec. Site Peak Lat Ref.  Amp Ref. Segments Distance    ms ms V V  cm  R Sural - Ankle (Calf)     Calf Ankle 3.6 ?4.4 5 ?6 Calf - Ankle 14  R Superficial peroneal - Ankle     Lat leg Ankle NR ?4.4 NR ?6 Lat leg - Ankle 14  R Median - Orthodromic (Dig II, Mid palm)      Dig II Wrist 2.4 ?3.4 13 ?10 Dig II - Wrist 13  R Ulnar - Orthodromic, (Dig V, Mid palm)     Dig V Wrist 2.6 ?3.1 8 ?5 Dig V - Wrist 34             F  Wave    Nerve F Lat Ref.   ms ms  R Ulnar - ADM 21.5 ?32.0  R Tibial - AH 49.3 ?56.0         EMG Summary Table    Spontaneous MUAP Recruitment  Muscle IA Fib PSW Fasc Other Amp Dur. Poly Pattern  R. Tibialis anterior Increased None None None _______ Normal Normal Normal Reduced  R. Tibialis posterior Normal None None None _______ Normal Normal Normal Normal  R. Peroneus longus Normal None None None _______ Normal Normal Normal Normal  R. Gastrocnemius (Medial head) Normal None None None _______ Normal Normal Normal Normal  R. Vastus lateralis Normal None None None _______ Normal Normal Normal Normal  R. Biceps femoris (short head) Normal None None None _______ Normal Normal Normal Normal  R. Abductor hallucis Normal None None None _______ Increased Increased Normal Reduced  R. Lumbar paraspinals (low) Increased None None None _______ Normal Normal Normal Normal  R. Lumbar paraspinals (mid) Increased None None None _______ Normal Normal Normal Normal  R. First dorsal interosseous Normal None None None _______ Normal Normal Normal Normal  R. Brachioradialis Normal None None None _______ Normal Normal Normal Normal  R. Biceps brachii Increased None None None _______ Increased Increased 1+ Reduced  R. Deltoid Increased None None None _______ Increased Increased 1+ Reduced  R. Triceps brachii Increased None None None _______ Increased Increased 1+ Reduced  R. Cervical paraspinals Normal None None None _______ Normal Normal Normal Normal

## 2021-07-02 DIAGNOSIS — Z0001 Encounter for general adult medical examination with abnormal findings: Secondary | ICD-10-CM | POA: Diagnosis not present

## 2021-07-02 DIAGNOSIS — R799 Abnormal finding of blood chemistry, unspecified: Secondary | ICD-10-CM | POA: Diagnosis not present

## 2021-07-02 DIAGNOSIS — R200 Anesthesia of skin: Secondary | ICD-10-CM | POA: Diagnosis not present

## 2021-07-02 DIAGNOSIS — G629 Polyneuropathy, unspecified: Secondary | ICD-10-CM | POA: Diagnosis not present

## 2021-07-02 DIAGNOSIS — Z79899 Other long term (current) drug therapy: Secondary | ICD-10-CM | POA: Diagnosis not present

## 2021-07-02 DIAGNOSIS — Z0389 Encounter for observation for other suspected diseases and conditions ruled out: Secondary | ICD-10-CM | POA: Diagnosis not present

## 2021-07-03 NOTE — Progress Notes (Signed)
EMG report is under procedure tab. 

## 2021-07-04 LAB — KAPPA/LAMBDA LIGHT CHAINS, FREE, WITH RATIO, 24HR. URINE
FR KAPPA LT CH,24HR: 6.92 mg/24 hr
FR LAMBDA LT CH,24HR: 1.87 mg/24 hr
Free Kappa Lt Chains,Ur: 9.23 mg/L (ref 1.17–86.46)
Free Lambda Lt Chains,Ur: 2.49 mg/L (ref 0.27–15.21)
Kappa/Lambda Ratio,U: 3.71 (ref 1.83–14.26)

## 2021-07-06 ENCOUNTER — Other Ambulatory Visit (HOSPITAL_COMMUNITY): Payer: Self-pay

## 2021-07-06 DIAGNOSIS — R1310 Dysphagia, unspecified: Secondary | ICD-10-CM

## 2021-07-07 LAB — MULTIPLE MYELOMA PANEL, SERUM
Albumin SerPl Elph-Mcnc: 4.1 g/dL (ref 2.9–4.4)
Albumin/Glob SerPl: 1.9 — ABNORMAL HIGH (ref 0.7–1.7)
Alpha 1: 0.2 g/dL (ref 0.0–0.4)
Alpha2 Glob SerPl Elph-Mcnc: 0.5 g/dL (ref 0.4–1.0)
B-Globulin SerPl Elph-Mcnc: 0.8 g/dL (ref 0.7–1.3)
Gamma Glob SerPl Elph-Mcnc: 0.8 g/dL (ref 0.4–1.8)
Globulin, Total: 2.2 g/dL (ref 2.2–3.9)
IgA/Immunoglobulin A, Serum: 229 mg/dL (ref 87–352)
IgG (Immunoglobin G), Serum: 698 mg/dL (ref 586–1602)
IgM (Immunoglobulin M), Srm: 168 mg/dL (ref 26–217)
Total Protein: 6.3 g/dL (ref 6.0–8.5)

## 2021-07-07 LAB — ANA W/REFLEX: Anti Nuclear Antibody (ANA): NEGATIVE

## 2021-07-07 LAB — C3 AND C4
Complement C3, Serum: 62 mg/dL — ABNORMAL LOW (ref 82–167)
Complement C4, Serum: 3 mg/dL — ABNORMAL LOW (ref 12–38)

## 2021-07-07 LAB — SEDIMENTATION RATE: Sed Rate: 2 mm/hr (ref 0–32)

## 2021-07-07 LAB — C-REACTIVE PROTEIN: CRP: <1 mg/L (ref 0–10)

## 2021-07-07 LAB — VITAMIN B12: Vitamin B-12: 782 pg/mL (ref 232–1245)

## 2021-07-07 LAB — HEPATITIS C ANTIBODY: Hep C Virus Ab: NONREACTIVE

## 2021-07-17 ENCOUNTER — Ambulatory Visit (HOSPITAL_COMMUNITY)
Admission: RE | Admit: 2021-07-17 | Discharge: 2021-07-17 | Disposition: A | Discharge status: Home / Self Care | Payer: BC Managed Care – PPO | Source: Ambulatory Visit | Attending: Neurosurgery | Admitting: Neurosurgery

## 2021-07-17 DIAGNOSIS — M810 Age-related osteoporosis without current pathological fracture: Secondary | ICD-10-CM | POA: Insufficient documentation

## 2021-07-17 DIAGNOSIS — R1310 Dysphagia, unspecified: Secondary | ICD-10-CM | POA: Diagnosis not present

## 2021-07-17 DIAGNOSIS — K219 Gastro-esophageal reflux disease without esophagitis: Secondary | ICD-10-CM | POA: Diagnosis not present

## 2021-07-17 NOTE — Progress Notes (Signed)
Modified Barium Swallow Progress Note  Patient Details  Name: Anne Garrett MRN: [id number] Date of Birth: 21-Apr-1975  Today's Date: 07/17/2021  Modified Barium Swallow completed.  Full report located under Chart Review in the Imaging Section.  Brief recommendations include the following:  Clinical Impression  Pt presents with functional appearing oropharyngeal swallowing. She has good efficiency and safety with swallowing, noting no aspiration on MBS. Note what appears to be a bony protrusion high on her cervical spine, consistent with CT Neck on 6/1, but this did not interefere with swallowing function. Pt was also observed in A/P view given unilateral symptoms, with view quite limited by cervical hardware but with what appears to be bilateral bolus transit/clearance. Recommend continuing with regular solids and thin liquids, and following up with MD as indicated for symptoms.   Swallow Evaluation Recommendations       SLP Diet Recommendations: Regular solids;Thin liquid   Liquid Administration via: Cup;Straw   Medication Administration: Whole meds with liquid   Supervision: Patient able to self feed   Compensations: Slow rate;Small sips/bites;Follow solids with liquid   Postural Changes: Seated upright at 90 degrees;Remain semi-upright after after feeds/meals (Comment)   Oral Care Recommendations: Oral care BID        Osie Bond., M.A. [place] Office [phone]  Secure chat preferred  07/17/2021,2:33 PM

## 2021-07-21 ENCOUNTER — Other Ambulatory Visit (HOSPITAL_COMMUNITY): Payer: Self-pay

## 2021-07-21 DIAGNOSIS — M5001 Cervical disc disorder with myelopathy,  high cervical region: Secondary | ICD-10-CM | POA: Diagnosis not present

## 2021-07-22 ENCOUNTER — Ambulatory Visit (INDEPENDENT_AMBULATORY_CARE_PROVIDER_SITE_OTHER): Payer: BC Managed Care – PPO | Admitting: Family Medicine

## 2021-07-22 ENCOUNTER — Encounter: Payer: Self-pay | Admitting: Family Medicine

## 2021-07-22 ENCOUNTER — Other Ambulatory Visit (HOSPITAL_COMMUNITY): Payer: Self-pay

## 2021-07-22 VITALS — BP 108/60 | HR 56 | Temp 98.7°F | Ht 59.5 in | Wt 129.2 lb

## 2021-07-22 DIAGNOSIS — Z1212 Encounter for screening for malignant neoplasm of rectum: Secondary | ICD-10-CM

## 2021-07-22 DIAGNOSIS — M818 Other osteoporosis without current pathological fracture: Secondary | ICD-10-CM

## 2021-07-22 DIAGNOSIS — T380X5A Adverse effect of glucocorticoids and synthetic analogues, initial encounter: Secondary | ICD-10-CM

## 2021-07-22 DIAGNOSIS — E2749 Other adrenocortical insufficiency: Secondary | ICD-10-CM | POA: Diagnosis not present

## 2021-07-22 DIAGNOSIS — T50905A Adverse effect of unspecified drugs, medicaments and biological substances, initial encounter: Secondary | ICD-10-CM

## 2021-07-22 DIAGNOSIS — D84821 Immunodeficiency due to drugs: Secondary | ICD-10-CM | POA: Diagnosis not present

## 2021-07-22 DIAGNOSIS — Z7952 Long term (current) use of systemic steroids: Secondary | ICD-10-CM

## 2021-07-22 DIAGNOSIS — M0800 Unspecified juvenile rheumatoid arthritis of unspecified site: Secondary | ICD-10-CM

## 2021-07-22 DIAGNOSIS — Z1211 Encounter for screening for malignant neoplasm of colon: Secondary | ICD-10-CM

## 2021-07-22 DIAGNOSIS — Z0000 Encounter for general adult medical examination without abnormal findings: Secondary | ICD-10-CM | POA: Diagnosis not present

## 2021-07-22 DIAGNOSIS — G43009 Migraine without aura, not intractable, without status migrainosus: Secondary | ICD-10-CM

## 2021-07-22 MED ORDER — BUTALBITAL-APAP-CAFFEINE 50-325-40 MG PO TABS
1.0000 | ORAL_TABLET | Freq: Four times a day (QID) | ORAL | 0 refills | Status: DC | PRN
Start: 2021-07-22 — End: 2021-10-11
  Filled 2021-07-22: qty 30, 8d supply, fill #0

## 2021-07-22 NOTE — Progress Notes (Signed)
Subjective  Chief Complaint  Patient presents with   Annual Exam    Pt here for Annual exam and is not currently fasting    HPI: Anne Garrett is a 46 y.o. female who presents to [place] at [place] today for a Female Wellness Visit. She also has the concerns and/or needs as listed above in the chief complaint. These will be addressed in addition to the Health Maintenance Visit.   Wellness Visit: annual visit with health maintenance review and exam without Pap  Health maintenance: Female wellness up-to-date.  Immunizations are current.  Overall she says she is doing well.  Possibly planning to move to [facility] within the next 3 to 6 months.  Her husband reported there.  She is working remotely from home and is liking her new job.  She is overdue for screening colonoscopy.  She had a colonoscopy in 2006 during work-up for abdominal pain.  She reports it was normal.  That was done in [place].  She also reports loose stools for the last year. Chronic disease f/u and/or acute problem visit: (deemed necessary to be done in addition to the wellness visit): Rheumatoid arthritis: On chronic steroids.  Reviewed rheumatology notes.  Having neck surgery next week due to a problem with her prior cervical fusion.  No new concerns. Migraine headaches: Improved since changed jobs.  Still have occasional migraine, very responsive to Fioricet.  Does need refill. Drug-induced osteoporosis due to chronic steroids: Reviewed most recent DEXA from May of last year.  Z score -2.1 which is improved from her prior DEXA.  Has been on drug holiday for the last 1 to 3 years.  Has follow-up with endocrinology and repeat bone density has been ordered.  Patient to get after her surgery.  Then can consider if restarting medications is indicated.  She continues on calcium and vitamin D.   Assessment  1. Annual physical exam   2. Juvenile rheumatoid arthritis ([place])   3. Secondary adrenal  insufficiency ([place])   4. Immunosuppression due to chronic steroid use ([place])   5. Drug-induced osteoporosis   6. Screening for colorectal cancer   7. Migraine without aura and without status migrainosus, not intractable      Plan  Female Wellness Visit: Age appropriate Health Maintenance and Prevention measures were discussed with patient. Included topics are cancer screening recommendations, ways to keep healthy (see AVS) including dietary and exercise recommendations, regular eye and dental care, use of seat belts, and avoidance of moderate alcohol use and tobacco use.  Refer for screening colonoscopy BMI: discussed patient's BMI and encouraged positive lifestyle modifications to help get to or maintain a target BMI. HM needs and immunizations were addressed and ordered. See below for orders. See HM and immunization section for updates. Routine labs and screening tests ordered including cmp, cbc and lipids where appropriate. Discussed recommendations regarding Vit D and calcium supplementation (see AVS)  Chronic disease management visit and/or acute problem visit: Osteoporosis: Recommend bone density and then follow-up with endocrinology to see if further treatment is indicated at this time. Rheumatoid arthritis per rheumatology.  Follow-up with neurosurgery for revision of her cervical fusion.  Also seeing neurology due to persistent paresthesias.  I reviewed lab work.  Negative eval today.  Negative EMG nerve conduction studies Migraines are controlled.  Refilled Fioricet Immunosuppressed Loose stools: Recommend colonoscopy for colon cancer screening and evaluation with GI.  Referral placed  Follow up: 12 months for physical or establish with new doctor  in [place] This Encounter  Procedures   Ambulatory referral to Gastroenterology   Meds ordered this encounter  Medications   butalbital-acetaminophen-caffeine (FIORICET) 50-325-40 MG tablet    Sig: Take 1 tablet by  mouth every 6  hours as needed for headache.    Dispense:  30 tablet    Refill:  0      Body mass index is 25.66 kg/m. Wt Readings from Last 3 Encounters:  07/22/21 129 lb 3.2 oz (58.6 kg)  07/01/21 132 lb (59.9 kg)  07/01/21 132 lb (59.9 kg)     Patient Active Problem List   Diagnosis Date Noted   Primary osteoarthritis of right knee 08/25/2020   OA (osteoarthritis) of knee 04/21/2020   Status post evacuation of hematoma 11/13/2019   Juvenile rheumatoid arthritis ([place]) 05/13/2019   Family history of breast cancer 05/13/2019   GERD (gastroesophageal reflux disease) 05/13/2019   Migraines 05/13/2019   Immunosuppression due to chronic steroid use ([place]) 05/13/2019   Rosacea 05/13/2019   S/P reverse total shoulder arthroplasty, left 09/07/2018   Lumbar pseudoarthrosis 06/04/2016   Lumbar stenosis 04/08/2016   S/P cervical spinal fusion 07/13/2015   Cervical stenosis of spinal canal 07/12/2015   HNP (herniated nucleus pulposus), cervical 06/19/2015   VAIN I (vaginal intraepithelial neoplasia grade I) 08/24/2013    Formatting of this note might be different from the original. On biopsy Aug 2015    H/O Clostridium difficile infection 01/16/2013    Formatting of this note might be different from the original. 2013 and 2014 (vaginal cleocin triggered the second one)    Hemangioma 01/16/2013   Secondary adrenal insufficiency ([place]) 01/16/2013    Formatting of this note might be different from the original. Prednisone therapy    Drug-induced osteoporosis 12/28/2012    DEXA: 2018 Z=-3.4; had taken boniva for 5 years. Drug holiday started 2018. Failed forteo due to leg pain attributed to chronic steroids DEXA: 2022 Z=-2.1 Dr. Kelton Pillar 2022 "I have recommended continuing with the drug holiday for another year, [facility]/2021 and will reassess - Breifly discussed Evenity and Prolia as option in the future "     Health Maintenance  Topic  Date Due   COLONOSCOPY (Pts 45-19yr Insurance coverage will need to be confirmed)  06/13/2018   COVID-19 Vaccine (4 - Booster for [place]) 08/26/2021 (Originally 10/24/2019)   INFLUENZA VACCINE  08/04/2021   DEXA SCAN  06/21/2022   PAP SMEAR-Modifier  01/05/2024   TETANUS/TDAP  01/12/2027   HPV VACCINES  Completed   Hepatitis C Screening  Completed   HIV Screening  Discontinued   Immunization History  Administered Date(s) Administered   HPV 9-valent 12/06/2016, 03/28/2017, 07/22/2017   Hepatitis B 01/05/2003   Influenza Inj Mdck Quad With Preservative 10/10/2017, 10/05/2018   Influenza,inj,Quad PF,6+ Mos 09/11/2013   Influenza-Unspecified 09/04/2013, 10/10/2014, 10/10/2015, 10/05/2016, 10/09/2017, 10/17/2018, 10/25/2019, 10/11/2020   PFIZER(Purple Top)SARS-COV-2 Vaccination 01/23/2019, 02/16/2019, 08/29/2019   Tdap 01/04/2006, 01/11/2017   We updated and reviewed the patient's past history in detail and it is documented below. Allergies: Patient is allergic to sulfa antibiotics, teriparatide, valproic acid and related, cyclobenzaprine, omeprazole, plaquenil [hydroxychloroquine], celexa [citalopram], diflucan [fluconazole], divalproex sodium, leflunomide, paroxetine hcl, and zoloft [sertraline]. Past Medical History Patient  has a past medical history of Adrenal insufficiency ([place], Eczema, Headache, Intervertebral disk disease, Juvenile idiopathic arthritis ([place], LGSIL (low grade squamous intraepithelial dysplasia) (08/20/2013), Osteoporosis, and Rheumatoid arthritis ([place]. Past Surgical History Patient  has a  past surgical history that includes Appendectomy; Cholecystectomy; Essure tubal ligation; Joint replacement (Bilateral); Carpal tunnel release (Right); Cesarean section; Ankle arthroplasty (Right); ORIF femur fracture (Right); Wrist surgery (Bilateral); excision to palm (Left); right foot surgery (Right); Anterior cervical decomp/discectomy fusion (N/A, 06/19/2015);  Colonoscopy; Posterior cervical fusion/foraminotomy (N/A, 07/12/2015); plif l5-s1 arthrodeisis   (2018); OTHER SURGICAL HISTORY (06/04/2016); Hardware Removal (Right, 02/02/2017); Repair extensor tendon (Right, 02/02/2017); Reverse shoulder arthroplasty (Left, 09/07/2018); Foot arthrodesis (Left, 03/01/2019); Cholecystectomy (2006); Reverse shoulder arthroplasty (Right, 09/20/2019); Revision total shoulder to reverse total shoulder (Right, 11/01/2019); Irrigation and debridement shoulder (Right, 11/13/2019); Total knee arthroplasty (Left, 04/21/2020); and Knee Arthroplasty (Right, 08/25/2020). Family History: Patient family history includes Breast cancer in her maternal aunt; Breast cancer (age of onset: 33) in her mother; Diabetes in her daughter; Epilepsy in her father; Multiple sclerosis in her maternal uncle; Nephritis in her father; Psoriasis in her paternal aunt. Social History:  Patient  reports that she quit smoking about 6 years ago. Her smoking use included cigarettes. She has a 5.00 pack-year smoking history. She has been exposed to tobacco smoke. She has never used smokeless tobacco. She reports current alcohol use of about 2.0 standard drinks of alcohol per week. She reports that she does not use drugs.  Review of Systems: Constitutional: negative for fever or malaise Ophthalmic: negative for photophobia, double vision or loss of vision Cardiovascular: negative for chest pain, dyspnea on exertion, or new LE swelling Respiratory: negative for SOB or persistent cough Gastrointestinal: negative for abdominal pain, change in bowel habits or melena Genitourinary: negative for dysuria or gross hematuria, no abnormal uterine bleeding or disharge Musculoskeletal: negative for new gait disturbance or muscular weakness Integumentary: negative for new or persistent rashes, no breast lumps Neurological: negative for TIA or stroke symptoms Psychiatric: negative for SI or delusions Allergic/Immunologic:  negative for hives  Patient Care Team    Relationship Specialty Notifications Start End  [name], MD PCP - General Family Medicine  05/11/19   Pa, [facility]  05/11/19   [name], MD Consulting Physician Rheumatology  05/13/19   [name], [place] Physician Orthopedic Surgery  05/13/19   [name], MD Consulting Physician Orthopedic Surgery  05/13/19   [name], MD Consulting Physician Orthopedic Surgery  05/13/19   [name], [place] Physician Obstetrics and Gynecology  05/13/19   Shamleffer, Melanie Crazier, MD Consulting Physician Endocrinology  07/22/21     Objective  Vitals: BP 108/60   Pulse (!) 56   Temp 98.7 F (37.1 C)   Ht 4' 11.5" (1.511 m)   Wt 129 lb 3.2 oz (58.6 kg)   LMP 07/02/2021 (Exact Date)   SpO2 98%   BMI 25.66 kg/m  General:  Well developed, well nourished, no acute distress  Psych:  Alert and orientedx3,normal mood and affect HEENT:  Normocephalic, atraumatic, non-icteric sclera,  supple neck without adenopathy, mass or thyromegaly Cardiovascular:  Normal S1, S2, RRR without gallop, rub or murmur Respiratory:  Good breath sounds bilaterally, CTAB with normal respiratory effort Gastrointestinal: normal bowel sounds, soft, non-tender, no noted masses. No HSM MSK: RA joint changes present Skin:  Warm, no rashes or suspicious lesions noted   Commons side effects, risks, benefits, and alternatives for medications and treatment plan prescribed today were discussed, and the patient expressed understanding of the given instructions. Patient is instructed to call or message via MyChart if he/she has any questions or concerns regarding our treatment plan. No barriers to understanding were identified.  We discussed Red Flag symptoms and signs in detail. Patient expressed understanding regarding what to do in case of urgent or emergency type symptoms.  Medication list was reconciled, printed and provided to the patient in  AVS. Patient instructions and summary information was reviewed with the patient as documented in the AVS. This note was prepared with assistance of Dragon voice recognition software. Occasional wrong-word or sound-a-like substitutions may have occurred due to the inherent limitations of voice recognition software  This visit occurred during the SARS-CoV-2 public health emergency.  Safety protocols were in place, including screening questions prior to the visit, additional usage of staff PPE, and extensive cleaning of exam room while observing appropriate contact time as indicated for disinfecting solutions.

## 2021-07-23 DIAGNOSIS — Z6825 Body mass index (BMI) 25.0-25.9, adult: Secondary | ICD-10-CM | POA: Diagnosis not present

## 2021-07-23 DIAGNOSIS — G959 Disease of spinal cord, unspecified: Secondary | ICD-10-CM | POA: Diagnosis not present

## 2021-07-24 DIAGNOSIS — D2272 Melanocytic nevi of left lower limb, including hip: Secondary | ICD-10-CM | POA: Diagnosis not present

## 2021-07-24 DIAGNOSIS — L718 Other rosacea: Secondary | ICD-10-CM | POA: Diagnosis not present

## 2021-07-24 DIAGNOSIS — D1801 Hemangioma of skin and subcutaneous tissue: Secondary | ICD-10-CM | POA: Diagnosis not present

## 2021-07-24 DIAGNOSIS — D485 Neoplasm of uncertain behavior of skin: Secondary | ICD-10-CM | POA: Diagnosis not present

## 2021-07-24 DIAGNOSIS — B081 Molluscum contagiosum: Secondary | ICD-10-CM | POA: Diagnosis not present

## 2021-07-24 DIAGNOSIS — D2239 Melanocytic nevi of other parts of face: Secondary | ICD-10-CM | POA: Diagnosis not present

## 2021-07-27 ENCOUNTER — Other Ambulatory Visit (HOSPITAL_COMMUNITY): Payer: Self-pay

## 2021-07-27 DIAGNOSIS — M2578 Osteophyte, vertebrae: Secondary | ICD-10-CM | POA: Diagnosis not present

## 2021-07-27 DIAGNOSIS — M532X2 Spinal instabilities, cervical region: Secondary | ICD-10-CM | POA: Diagnosis not present

## 2021-07-27 DIAGNOSIS — M069 Rheumatoid arthritis, unspecified: Secondary | ICD-10-CM | POA: Diagnosis not present

## 2021-07-27 DIAGNOSIS — M4712 Other spondylosis with myelopathy, cervical region: Secondary | ICD-10-CM | POA: Diagnosis not present

## 2021-07-27 DIAGNOSIS — M47812 Spondylosis without myelopathy or radiculopathy, cervical region: Secondary | ICD-10-CM | POA: Diagnosis not present

## 2021-07-27 DIAGNOSIS — Z981 Arthrodesis status: Secondary | ICD-10-CM | POA: Diagnosis not present

## 2021-07-27 HISTORY — PX: ANTERIOR CERVICAL DECOMP/DISCECTOMY FUSION: SHX1161

## 2021-07-27 MED ORDER — HYDROCODONE-ACETAMINOPHEN 5-325 MG PO TABS
1.0000 | ORAL_TABLET | Freq: Four times a day (QID) | ORAL | 0 refills | Status: DC | PRN
  Filled 2021-07-27: qty 30, 8d supply, fill #0

## 2021-07-27 MED ORDER — METHOCARBAMOL 500 MG PO TABS
500.0000 mg | ORAL_TABLET | Freq: Four times a day (QID) | ORAL | 0 refills | Status: DC
  Filled 2021-07-27: qty 45, 12d supply, fill #0

## 2021-08-05 ENCOUNTER — Other Ambulatory Visit (HOSPITAL_COMMUNITY): Payer: Self-pay

## 2021-08-05 DIAGNOSIS — G959 Disease of spinal cord, unspecified: Secondary | ICD-10-CM | POA: Diagnosis not present

## 2021-08-05 MED ORDER — METHYLPREDNISOLONE 4 MG PO TBPK
ORAL_TABLET | ORAL | 0 refills | Status: DC
  Filled 2021-08-05: qty 21, 6d supply, fill #0

## 2021-08-06 DIAGNOSIS — R202 Paresthesia of skin: Secondary | ICD-10-CM | POA: Diagnosis not present

## 2021-08-06 DIAGNOSIS — R200 Anesthesia of skin: Secondary | ICD-10-CM | POA: Diagnosis not present

## 2021-08-06 DIAGNOSIS — G959 Disease of spinal cord, unspecified: Secondary | ICD-10-CM | POA: Diagnosis not present

## 2021-08-10 ENCOUNTER — Other Ambulatory Visit (HOSPITAL_COMMUNITY): Payer: Self-pay

## 2021-08-10 MED ORDER — METHYLPREDNISOLONE 4 MG PO TBPK
ORAL_TABLET | ORAL | 0 refills | Status: DC
Start: 2021-08-10 — End: 2021-09-29
  Filled 2021-08-10: qty 21, 6d supply, fill #0

## 2021-08-15 ENCOUNTER — Other Ambulatory Visit: Payer: Self-pay | Admitting: Family Medicine

## 2021-08-16 ENCOUNTER — Encounter: Payer: Self-pay | Admitting: Family Medicine

## 2021-08-18 ENCOUNTER — Other Ambulatory Visit (HOSPITAL_COMMUNITY): Payer: Self-pay

## 2021-08-18 MED ORDER — PANTOPRAZOLE SODIUM 20 MG PO TBEC
20.0000 mg | DELAYED_RELEASE_TABLET | Freq: Every day | ORAL | 0 refills | Status: DC
  Filled 2021-08-18: qty 90, 90d supply, fill #0

## 2021-08-19 ENCOUNTER — Other Ambulatory Visit (HOSPITAL_COMMUNITY): Payer: Self-pay

## 2021-08-19 MED ORDER — NYSTATIN 100000 UNIT/ML MT SUSP
5.0000 mL | Freq: Four times a day (QID) | OROMUCOSAL | 1 refills | Status: DC
  Filled 2021-08-19 (×2): qty 60, 3d supply, fill #0

## 2021-08-19 MED ORDER — HYDROCODONE-ACETAMINOPHEN 5-325 MG PO TABS
1.0000 | ORAL_TABLET | Freq: Four times a day (QID) | ORAL | 0 refills | Status: DC | PRN
  Filled 2021-08-19: qty 45, 12d supply, fill #0

## 2021-08-20 ENCOUNTER — Telehealth: Payer: Self-pay | Admitting: Family Medicine

## 2021-08-20 NOTE — Telephone Encounter (Addendum)
Patient states: - Upon receiving bill she saw CPT codes that were incorrect on 2021-08-04 visit with PCP - The visit was coded as a physical when she was "insistent on not having a physical"  - "Objective of visit was a medication refill for migraines", but she was asked about other things including colonoscopy screening  - "Topics dragged out of her"  Patient requests: - Dawn, Producer, television/film/video, audit visit since "this was not a preventative split visit" - A callback with an update on issue at [phone]  I offered pt to call billing, but she declined.

## 2021-08-20 NOTE — Telephone Encounter (Signed)
Sent email to coding.

## 2021-09-03 DIAGNOSIS — M25521 Pain in right elbow: Secondary | ICD-10-CM | POA: Diagnosis not present

## 2021-09-09 ENCOUNTER — Other Ambulatory Visit (HOSPITAL_COMMUNITY): Payer: Self-pay

## 2021-09-09 MED ORDER — METHOCARBAMOL 500 MG PO TABS
500.0000 mg | ORAL_TABLET | Freq: Four times a day (QID) | ORAL | 0 refills | Status: DC
  Filled 2021-09-09: qty 45, 12d supply, fill #0

## 2021-09-09 MED ORDER — HYDROCODONE-ACETAMINOPHEN 5-325 MG PO TABS
1.0000 | ORAL_TABLET | Freq: Four times a day (QID) | ORAL | 0 refills | Status: DC
  Filled 2021-09-09: qty 15, 4d supply, fill #0

## 2021-09-10 NOTE — Telephone Encounter (Signed)
Patient states CPE was to stay and office visit was to be removed.    Coding has spoken with patient and claim has been resubmitted.

## 2021-09-15 ENCOUNTER — Telehealth: Payer: Self-pay | Admitting: Gastroenterology

## 2021-09-15 DIAGNOSIS — Z79899 Other long term (current) drug therapy: Secondary | ICD-10-CM | POA: Diagnosis not present

## 2021-09-15 DIAGNOSIS — G959 Disease of spinal cord, unspecified: Secondary | ICD-10-CM | POA: Diagnosis not present

## 2021-09-15 DIAGNOSIS — M0579 Rheumatoid arthritis with rheumatoid factor of multiple sites without organ or systems involvement: Secondary | ICD-10-CM | POA: Diagnosis not present

## 2021-09-15 DIAGNOSIS — M2550 Pain in unspecified joint: Secondary | ICD-10-CM | POA: Diagnosis not present

## 2021-09-15 DIAGNOSIS — M810 Age-related osteoporosis without current pathological fracture: Secondary | ICD-10-CM | POA: Diagnosis not present

## 2021-09-15 NOTE — Telephone Encounter (Signed)
Good Morning Dr Rush Landmark,  Supervising MD 7/19 PM  We have received a referral from patient primary care for patient to have a colonoscopy procedure.  Patient states her last colonoscopy was in 2006 with a GI provider in [place] that she is not able to retrieve records.  Patient had and ov with provider Ronnette Juniper in 2001 with [facility], but did not receive any procedure with them.  Records are available for review in epic. Please review and advise on scheduling.  Thank you

## 2021-09-15 NOTE — Telephone Encounter (Signed)
Thank you for the update. If the patient wants to have her GI care here I am happy to take over that care here. Even if she had a colonoscopy in 2006, she is due based on colon cancer screening guidelines at this time based on her age. Please work on obtaining the previous [facility] records for further review. Patient can be scheduled for direct colonoscopy for colon cancer screening. If she wants to be seen in clinic, she can be seen by myself or one of the APP's (I can supervise). Thanks. GM

## 2021-09-15 NOTE — Telephone Encounter (Signed)
Good evening Dr Rush Landmark.  Patient has no other records available other than the records in care everywhere.  Please advise. Thank you

## 2021-09-15 NOTE — Telephone Encounter (Signed)
I would suspect that there would be [facility] records if she was seen in clinic there which could be reviewed. But in any case, okay to move forward with colonoscopy if we cannot get those records. Thanks. GM

## 2021-09-16 ENCOUNTER — Other Ambulatory Visit (HOSPITAL_COMMUNITY): Payer: Self-pay

## 2021-09-16 DIAGNOSIS — Z96611 Presence of right artificial shoulder joint: Secondary | ICD-10-CM | POA: Diagnosis not present

## 2021-09-16 DIAGNOSIS — Z96612 Presence of left artificial shoulder joint: Secondary | ICD-10-CM | POA: Diagnosis not present

## 2021-09-16 MED ORDER — HYDROCODONE-ACETAMINOPHEN 5-325 MG PO TABS
1.0000 | ORAL_TABLET | Freq: Two times a day (BID) | ORAL | 0 refills | Status: DC | PRN
  Filled 2021-09-16: qty 30, 15d supply, fill #0

## 2021-09-17 ENCOUNTER — Other Ambulatory Visit (HOSPITAL_COMMUNITY): Payer: Self-pay

## 2021-09-17 DIAGNOSIS — Z96611 Presence of right artificial shoulder joint: Secondary | ICD-10-CM | POA: Diagnosis not present

## 2021-09-17 DIAGNOSIS — Z96612 Presence of left artificial shoulder joint: Secondary | ICD-10-CM | POA: Diagnosis not present

## 2021-09-17 NOTE — Telephone Encounter (Addendum)
Patient had a colonoscopy in 2006 in [place], [place] and does not recall the physicians name who completed the procedure.  Patient states she was diagnosed with IBS.  She was evaluated at [facility] in 2021, by telehealth visit.  At that time a colonoscopy was recommended.  Patient declined due to [place].  She has since started having issues with abdominal pain and PCP recommended that she be scheduled for screening colonoscopy.  Referral was sent to our office.  Spoke with patient in full detail regarding referral process at [facility].  Patient was frustrated with the communication during the scheduling process. After an extensive phone call with the patient, she agreed to see an APP for consult and schedule colonoscopy after office visit.   Patient aware that she is scheduled to see Carolan Clines, PA on 09-29-21 at 3:00pm to further discuss colonoscopy with Dr Rush Landmark.  Patient agreed to plan and verbalized understanding.

## 2021-09-17 NOTE — Telephone Encounter (Signed)
Called patient to schedule. Patient will call back when more days available.

## 2021-09-18 ENCOUNTER — Other Ambulatory Visit: Payer: Self-pay | Admitting: Orthopedic Surgery

## 2021-09-18 ENCOUNTER — Other Ambulatory Visit (HOSPITAL_COMMUNITY): Payer: Self-pay | Admitting: Orthopedic Surgery

## 2021-09-18 DIAGNOSIS — Z96612 Presence of left artificial shoulder joint: Secondary | ICD-10-CM

## 2021-09-18 DIAGNOSIS — Z96611 Presence of right artificial shoulder joint: Secondary | ICD-10-CM

## 2021-09-28 ENCOUNTER — Encounter: Payer: Self-pay | Admitting: *Deleted

## 2021-09-29 ENCOUNTER — Other Ambulatory Visit (HOSPITAL_COMMUNITY): Payer: Self-pay

## 2021-09-29 ENCOUNTER — Ambulatory Visit: Payer: BC Managed Care – PPO | Admitting: Physician Assistant

## 2021-09-29 ENCOUNTER — Encounter: Payer: Self-pay | Admitting: Physician Assistant

## 2021-09-29 VITALS — BP 118/64 | HR 64 | Ht 59.5 in | Wt 126.2 lb

## 2021-09-29 DIAGNOSIS — Z9049 Acquired absence of other specified parts of digestive tract: Secondary | ICD-10-CM | POA: Diagnosis not present

## 2021-09-29 DIAGNOSIS — R0989 Other specified symptoms and signs involving the circulatory and respiratory systems: Secondary | ICD-10-CM | POA: Diagnosis not present

## 2021-09-29 DIAGNOSIS — K529 Noninfective gastroenteritis and colitis, unspecified: Secondary | ICD-10-CM

## 2021-09-29 MED ORDER — NA SULFATE-K SULFATE-MG SULF 17.5-3.13-1.6 GM/177ML PO SOLN
1.0000 | Freq: Once | ORAL | 0 refills | Status: AC
  Filled 2021-09-29: qty 354, 1d supply, fill #0

## 2021-09-29 NOTE — Progress Notes (Signed)
Chief Complaint: Discuss colonoscopy  HPI:    Anne Garrett is a 46 year old female with a past medical history as listed below, including rheumatoid arthritis on Tocilizumab who was referred to me by [name], MD for question of a colonoscopy for screening.    Patient has been accepted by Dr. Rush Landmark.  Apparently she had a colonoscopy in 2006 in [place] and was diagnosed with IBS.  Also evaluated [facility] in 2021 by telehealth visit and at that time colonoscopy was recommended but she declined due to [place].  Since then is started have issues with abdominal pain.    Today, the patient explains that her GI history all began back in 2006 when she was having excruciating abdominal pain.  During that entire work-up she ended up having a colonoscopy which was normal and was eventually found to have a gangrenous gallbladder and had a cholecystectomy in 2006.  Describes that she also had Prometheus testing which she brings with her that showed markers confirmed ulcerative colitis.  This was not seen at time of her colonoscopy though per her.  When she got her gallbladder out the abdominal pain mostly went away but tells me now that she has continued with unformed stool over the past 10 to 15 years, as long as she can remember.  Sometimes this will "flare" and she will have stools 5-7 times a day but her normal is about 1-2 times a day.  They are only urgent during a flare and sometimes have a bad "odor" during a flare.  The flares will last for a few days to a week and then typically stop.  Tells me her last flare was last month and she took Imodium which did help with the urgency but did not make her stool solid.  She has tried very high-strength probiotics which sometimes help a little bit with the urgency.  Nothing has ever made them formed.    Also describes a history of 2 episodes of C. 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    Also describes previous history of a globus sensation, she was started on Pantoprazole 20 mg due to this and it went away.  They suggested she had GERD though she does not have any other symptoms.    Denies fever, chills, weight loss, blood in her stool or symptoms that awaken her from sleep.  Past Medical History:  Diagnosis Date   Adrenal insufficiency ([place])    secondary to Prednisone   Eczema    coner oif eye lid   Headache    migraines   Intervertebral disk disease    Juvenile idiopathic arthritis ([place])    LGSIL (low grade squamous intraepithelial dysplasia) 08/20/2013   Formatting of this note might be different from the original. On pap smear June 2015.   HR HPV positive.  Colposcopy and cervical AND vaginal biopsies done Aug 2015   Osteoporosis    Rheumatoid arthritis ([place])    Juvenile onset    Past Surgical History:  Procedure Laterality Date   ANKLE ARTHROPLASTY Right    ANTERIOR CERVICAL DECOMP/DISCECTOMY FUSION N/A 06/19/2015   Procedure: C3-4 C4-5 Anterior cervical decompression/diskectomy/fusion;  Surgeon: Jovita Gamma, MD;  Location: [facility];  Service: Neurosurgery;  Laterality: N/A;  C3-4 C4-5 Anterior cervical decompression/diskectomy/fusion   APPENDECTOMY     CARPAL TUNNEL RELEASE Right  CESAREAN SECTION     CHOLECYSTECTOMY     CHOLECYSTECTOMY  2006   COLONOSCOPY     ESSURE TUBAL LIGATION     Essure Implant-Permanent birth control   excision to palm Left    excision of mass to right plam   FOOT ARTHRODESIS Left 03/01/2019   Procedure: Left talonavicular and naviculocuneiform arthrodesis;  Surgeon: [name], MD;  Location: [facility];  Service: Orthopedics;  Laterality: Left;   HARDWARE REMOVAL Right 02/02/2017   Procedure: RIGHT WRIST DEEP IMPLANT REMOVAL, RIGHT WRIST AND HAND EXTENSOR TENDON RECONSTRUCTION, TENDON TRANSFER AND RECONSTRUCTION AS INDICATED;  Surgeon:  Iran Planas, MD;  Location: [facility];  Service: Orthopedics;  Laterality: Right;   IRRIGATION AND DEBRIDEMENT SHOULDER Right 11/13/2019   Procedure: IRRIGATION AND DEBRIDEMENT RIGHT SHOULDER WITH WOUND CLOSURE;  Surgeon: [name], MD;  Location: [facility];  Service: Orthopedics;  Laterality: Right;  18mn   JOINT REPLACEMENT Bilateral    bilat hip replacements   KNEE ARTHROPLASTY Right 08/25/2020   Procedure: COMPUTER ASSISTED TOTAL KNEE ARTHROPLASTY;  Surgeon: AGaynelle Arabian MD;  [facility];  Service: Orthopedics;  Laterality: Right;   ORIF FEMUR FRACTURE Right    OTHER SURGICAL HISTORY  06/04/2016   Revision of PLIF   plif l5-s1 arthrodeisis    2018   POSTERIOR CERVICAL FUSION/FORAMINOTOMY N/A 07/12/2015   Procedure: C2 to C5 Cervical laminectomy, C2 to C5 posterior cervical arthrodesis with instrumentation and bone graft;  Surgeon: RJovita Gamma MD;  [facility];  Service: Neurosurgery;  Laterality: N/A;  C2 to C5 Cervical laminectomy, C2  to C5 posterior cervical arthrodesis with instrumentation and bone graft   REPAIR EXTENSOR TENDON Right 02/02/2017   Procedure: REPAIR EXTENSOR TENDON;  Surgeon: OIran Planas MD;  [facility]  Service: Orthopedics;  Laterality: Right;   REVERSE SHOULDER ARTHROPLASTY Left 09/07/2018   Procedure: REVERSE SHOULDER ARTHROPLASTY;  Surgeon: SJustice Britain MD;  [facility];  Service: Orthopedics;  Laterality: Left;   REVERSE SHOULDER ARTHROPLASTY Right 09/20/2019   Procedure: REVERSE SHOULDER ARTHROPLASTY;  Surgeon: SJustice Britain MD;  [facility];  Service: Orthopedics;  Laterality: Right;  1287m   REVISION TOTAL SHOULDER TO REVERSE TOTAL SHOULDER Right 11/01/2019   Procedure: revision right total shoulder to reverse total shoulder;  Surgeon: SuJustice BritainMD;  [facility];  Service: Orthopedics;  Laterality: Right;  exparel block   right foot surgery Right    right jfoot arthrodesis and right ankle arthroplasty   TOTAL KNEE  ARTHROPLASTY Left 04/21/2020   Procedure: TOTAL KNEE ARTHROPLASTY;  Surgeon: AlGaynelle ArabianMD;  [facility];  Service: Orthopedics;  Laterality: Left;  5032m  WRIST SURGERY Bilateral    arthrodesis    Current Outpatient Medications  Medication Sig Dispense Refill   amoxicillin (AMOXIL) 500 MG capsule Take 4 capsules (2,000 mg total) by mouth 1 hour before dental appt for pre-med 8 capsule 1   BIOTIN PO Take 1 tablet by mouth daily.     butalbital-acetaminophen-caffeine (FIORICET) 50-325-40 MG tablet Take 1 tablet by mouth every 6  hours as needed for headache. 30 tablet 0   Calcium Carb-Cholecalciferol (CALCIUM+D3 PO) Take 1 tablet by mouth daily.     CALCIUM-VITAMIN D PO Take by mouth.     Cholecalciferol (VITAMIN D3) 50 MCG (2000 UT) TABS Take 2,000 Units by mouth daily.     HYDROcodone-acetaminophen (NORCO/VICODIN) 5-325 MG tablet Take 1 tablet by mouth every 6 (six) hours as needed. 45 tablet  0   HYDROcodone-acetaminophen (NORCO/VICODIN) 5-325 MG tablet Take 1 tablet by mouth every 6 (six) hours. 15 tablet 0   HYDROcodone-acetaminophen (NORCO/VICODIN) 5-325 MG tablet Take 1 tablet by mouth 2 (two) times daily as needed. 30 tablet 0   methocarbamol (ROBAXIN) 500 MG tablet Take 1 tablet (500 mg total) by mouth 4 (four) times daily. 45 tablet 0   methylPREDNISolone (MEDROL DOSEPAK) 4 MG TBPK tablet Use as directed. 21 tablet 0   Multiple Vitamin (MULTIVITAMIN) capsule Take 1 capsule by mouth daily.     naproxen sodium (ALEVE) 220 MG tablet Take 220 mg by mouth.     nystatin (MYCOSTATIN) 100000 UNIT/ML suspension Take 5 mLs (500,000 Units total) by mouth 4 (four) times daily. 60 mL 1   Omega-3 Fatty Acids (FISH OIL PO) Take by mouth.     pantoprazole (PROTONIX) 20 MG tablet Take 1 tablet (20 mg total) by mouth daily. 90 tablet 0   predniSONE (DELTASONE) 5 MG tablet Take 1-2 tablets (5-10 mg total) by mouth daily. 180 tablet 1   PRESCRIPTION MEDICATION Apply 1 application topically See  admin instructions. Apply dime-size amount to face 1-2 TIMES per day as needed rosacea   Metronidazole 1% + Ivermectin 1% + Azelaic Acid 15%     Tocilizumab (ACTEMRA ACTPEN) 162 MG/0.9ML SOAJ AS DIRECTED SUBCUTANEOUS WEEKLY. 3.6 mL 2   triamcinolone cream (KENALOG) 0.1 % Apply 1 application topically 2 (two) times daily as needed. (Patient taking differently: Apply 1 application  topically 2 (two) times daily as needed (eczema).) 15 g 1   No current facility-administered medications for this visit.    Allergies as of 09/29/2021 - Review Complete 07/22/2021  Allergen Reaction Noted   Sulfa antibiotics Itching and Rash 04/11/2015   Teriparatide Other (See Comments) 05/20/2015   Valproic acid and related Rash 05/15/2015   Cyclobenzaprine Swelling 07/28/2020   Omeprazole Diarrhea 08/07/2020   Plaquenil [hydroxychloroquine] Diarrhea 08/07/2020   Celexa [citalopram] Diarrhea 06/02/2016   Diflucan [fluconazole] Rash 09/03/2019   Divalproex sodium Rash 07/28/2020   Leflunomide  05/20/2015   Paroxetine hcl Diarrhea 05/20/2015   Zoloft [sertraline] Diarrhea 06/02/2016    Family History  Problem Relation Age of Onset   Breast cancer Mother 44   Nephritis Father    Epilepsy Father    Breast cancer Maternal Aunt        2 mat aunts    Multiple sclerosis Maternal Uncle    Psoriasis Paternal Aunt    Diabetes Daughter     Social History   Socioeconomic History   Marital status: Single    Spouse name: Not on file   Number of children: 2   Years of education: 12   Highest education level: Not on file  Occupational History   Occupation: Compliance Furniture conservator/restorer: [employer]  Tobacco Use   Smoking status: Former    Packs/day: 0.25    Years: 20.00    Total pack years: 5.00    Types: Cigarettes    Quit date: 07/02/2015    Years since quitting: 6.2    Passive exposure: Past   Smokeless tobacco: Never  Vaping Use   Vaping Use: Never used  Substance and Sexual Activity    Alcohol use: Yes    Alcohol/week: 2.0 standard drinks of alcohol    Types: 2 Glasses of wine per week    Comment: social   Drug use: No   Sexual activity: Yes    Birth control/protection: Other-see comments  Comment: essure coils  Other Topics Concern   Not on file  Social History Narrative   Lives at home w/ 2 cats and child every other weekend   Significant other - Chris   Right-hand   Drinks about 2 cups of coffee or soda per day   Social Determinants of Radio broadcast assistant Strain: Not on file  Food Insecurity: Not on file  Transportation Needs: Not on file  Physical Activity: Not on file  Stress: Not on file  Social Connections: Not on file  Intimate Partner Violence: Not on file    Review of Systems:    Constitutional: No weight loss, fever or chills Skin: No rash Cardiovascular: No chest pain Respiratory: No SOB Gastrointestinal: See HPI and otherwise negative Genitourinary: No dysuria  Neurological: No headache, dizziness or syncope Musculoskeletal: +RA Hematologic: No bleeding  Psychiatric: No history of depression or anxiety   Physical Exam:  Vital signs: BP 118/64   Pulse 64   Ht 4' 11.5" (1.511 m)   Wt 126 lb 3.2 oz (57.2 kg)   BMI 25.06 kg/m    Constitutional:   Pleasant Caucasian female appears to be in NAD, Well developed, Well nourished, alert and cooperative Head:  Normocephalic and atraumatic. Eyes:   PEERL, EOMI. No icterus. Conjunctiva pink. Ears:  Normal auditory acuity. Neck:  Supple Throat: Oral cavity and pharynx without inflammation, swelling or lesion.  Respiratory: Respirations even and unlabored. Lungs clear to auscultation bilaterally.   No wheezes, crackles, or rhonchi.  Cardiovascular: Normal S1, S2. No MRG. Regular rate and rhythm. No peripheral edema, cyanosis or pallor.  Gastrointestinal:  Soft, nondistended, nontender. No rebound or guarding. Normal bowel sounds. No appreciable masses or hepatomegaly. Rectal:  Not  performed.  Msk:  Symmetrical without gross deformities. Without edema. +some deformity right hand Neurologic:  Alert and  oriented x4;  grossly normal neurologically.  Skin:   Dry and intact without significant lesions or rashes. Psychiatric: Demonstrates good judgement and reason without abnormal affect or behaviors.  RELEVANT LABS AND IMAGING: CBC    Component Value Date/Time   WBC 7.5 06/22/2021 1205   RBC 3.73 (L) 06/22/2021 1205   HGB 12.5 06/22/2021 1205   HCT 38.1 06/22/2021 1205   PLT 304 06/22/2021 1205   MCV 102.1 (H) 06/22/2021 1205   MCH 33.5 06/22/2021 1205   MCHC 32.8 06/22/2021 1205   RDW 13.6 06/22/2021 1205   LYMPHSABS 3.1 11/23/2019 0841   MONOABS 1.0 11/23/2019 0841   EOSABS 0.1 11/23/2019 0841   BASOSABS 0.0 11/23/2019 0841    CMP     Component Value Date/Time   NA 139 06/22/2021 1205   NA 139 04/30/2019 0000   K 4.0 06/22/2021 1205   CL 106 06/22/2021 1205   CO2 24 06/22/2021 1205   GLUCOSE 97 06/22/2021 1205   BUN 7 06/22/2021 1205   BUN 12 07/30/2019 0000   CREATININE 0.60 06/22/2021 1205   CREATININE 0.63 11/23/2019 0841   CALCIUM 8.9 06/22/2021 1205   PROT 6.3 06/30/2021 1537   ALBUMIN 4.5 05/18/2021 0807   AST 20 08/14/2020 0813   AST 14 (L) 11/23/2019 0841   ALT 15 08/14/2020 0813   ALT 11 11/23/2019 0841   ALKPHOS 63 08/14/2020 0813   BILITOT 0.4 08/14/2020 0813   BILITOT 0.2 (L) 11/23/2019 0841   GFRNONAA >60 06/22/2021 1205   GFRNONAA >60 11/23/2019 0841   GFRAA >60 09/13/2019 0830    Assessment: 1.  Chronic diarrhea: For the  past 10 to 15 years per patient her stools never formed, sometimes flares and has 5-7 stools a day which are urgent with a bad odor, her normal is 1 to 2/day, previous Prometheus testing was positive for possible UC, though colonoscopy around that time did not show this per her report; consider IBD given history of RA versus bile acid diarrhea versus IBS versus infectious cause 2.  Status postcholecystectomy  2006 3.  Globus sensation: Started on Pantoprazole 20 mg daily due to this and the symptoms got better; likely GERD  Plan: 1.  Scheduled patient for diagnostic EGD and colonoscopy in the [place] with Dr. Rush Landmark.  Did provide the patient a detailed list of risks for the procedures and she agrees to proceed.  This will be scheduled 1-1/2 to 2 months from now to allow time for stool studies to result. 2.  Ordered stool studies to include a GI pathogen panel, O&P and fecal calprotectin 3.  Continue Pantoprazole 20 mg daily 3.  Patient to follow in clinic per recommendations after above.  Ellouise Newer, PA-C [facility] 09/29/2021, 2:52 PM  Cc: [name], MD

## 2021-09-29 NOTE — Patient Instructions (Signed)
_______________________________________________________  If you are age 48 or older, your body mass index should be between 23-30. Your Body mass index is 25.06 kg/m. If this is out of the aforementioned range listed, please consider follow up with your Primary Care Provider.  If you are age 96 or younger, your body mass index should be between 19-25. Your Body mass index is 25.06 kg/m. If this is out of the aformentioned range listed, please consider follow up with your Primary Care Provider.   ________________________________________________________  The [facility] providers would like to encourage you to use [facility] to communicate with providers for non-urgent requests or questions.  Due to long hold times on the telephone, sending your provider a message by [facility] may be a faster and more efficient way to get a response.  Please allow 48 business hours for a response.  Please remember that this is for non-urgent requests.  _______________________________________________________  Dennis Bast have been scheduled for an endoscopy and colonoscopy. Please follow the written instructions given to you at your visit today. Please pick up your prep supplies at the pharmacy within the next 1-3 days. If you use inhalers (even only as needed), please bring them with you on the day of your procedure.   Your provider has requested that you go to the basement level for lab work before leaving today. Press "B" on the elevator. The lab is located at the first door on the left as you exit the elevator.  Due to recent changes in healthcare laws, you may see the results of your imaging and laboratory studies on MyChart before your provider has had a chance to review them.  We understand that in some cases there may be results that are confusing or concerning to you. Not all laboratory results come back in the same time frame and the provider may be waiting for multiple results in order to interpret others.  Please give  [place] 48 hours in order for your provider to thoroughly review all the results before contacting the office for clarification of your results.    It was a pleasure to see you today!  Thank you for trusting me with your gastrointestinal care!

## 2021-09-29 NOTE — Progress Notes (Signed)
Attending Physician's Attestation   I have reviewed the chart.   I agree with the Advanced Practitioner's note, impression, and recommendations with any updates as below.    Taseen Marasigan Mansouraty, MD [facility] Advanced Endoscopy Office # [phone]  

## 2021-09-30 ENCOUNTER — Other Ambulatory Visit (HOSPITAL_COMMUNITY): Payer: Self-pay

## 2021-09-30 ENCOUNTER — Other Ambulatory Visit: Payer: BC Managed Care – PPO

## 2021-09-30 DIAGNOSIS — K529 Noninfective gastroenteritis and colitis, unspecified: Secondary | ICD-10-CM | POA: Diagnosis not present

## 2021-09-30 DIAGNOSIS — R0989 Other specified symptoms and signs involving the circulatory and respiratory systems: Secondary | ICD-10-CM

## 2021-09-30 MED ORDER — HYDROCODONE-ACETAMINOPHEN 5-325 MG PO TABS
1.0000 | ORAL_TABLET | Freq: Three times a day (TID) | ORAL | 0 refills | Status: DC | PRN
  Filled 2021-09-30: qty 40, 14d supply, fill #0

## 2021-10-01 ENCOUNTER — Encounter (HOSPITAL_COMMUNITY)
Admission: RE | Admit: 2021-10-01 | Discharge: 2021-10-01 | Disposition: A | Discharge status: Home / Self Care | Payer: BC Managed Care – PPO | Source: Ambulatory Visit | Attending: Orthopedic Surgery | Admitting: Orthopedic Surgery

## 2021-10-01 DIAGNOSIS — M25511 Pain in right shoulder: Secondary | ICD-10-CM | POA: Diagnosis not present

## 2021-10-01 DIAGNOSIS — Z96611 Presence of right artificial shoulder joint: Secondary | ICD-10-CM | POA: Insufficient documentation

## 2021-10-01 DIAGNOSIS — Z96612 Presence of left artificial shoulder joint: Secondary | ICD-10-CM | POA: Diagnosis not present

## 2021-10-01 MED ORDER — TECHNETIUM TC 99M MEDRONATE IV KIT
20.0000 | PACK | Freq: Once | INTRAVENOUS | Status: AC | PRN
  Administered 2021-10-01: 21.7 via INTRAVENOUS

## 2021-10-02 LAB — GI PROFILE, STOOL, PCR

## 2021-10-05 LAB — CALPROTECTIN: Calprotectin: 68 mcg/g

## 2021-10-08 LAB — OVA AND PARASITE EXAMINATION
CONCENTRATE RESULT:: NONE SEEN
MICRO NUMBER:: 13975505
SPECIMEN QUALITY:: ADEQUATE
TRICHROME RESULT:: NONE SEEN

## 2021-10-11 ENCOUNTER — Other Ambulatory Visit: Payer: Self-pay | Admitting: Family Medicine

## 2021-10-12 ENCOUNTER — Other Ambulatory Visit (HOSPITAL_COMMUNITY): Payer: Self-pay

## 2021-10-12 MED ORDER — BUTALBITAL-APAP-CAFFEINE 50-325-40 MG PO TABS
1.0000 | ORAL_TABLET | Freq: Four times a day (QID) | ORAL | 5 refills | Status: DC | PRN
  Filled 2021-10-12: qty 30, 8d supply, fill #0
  Filled 2021-12-08: qty 30, 8d supply, fill #1
  Filled 2022-01-10: qty 30, 8d supply, fill #2
  Filled 2022-02-06: qty 30, 8d supply, fill #3

## 2021-10-18 ENCOUNTER — Other Ambulatory Visit (HOSPITAL_COMMUNITY): Payer: Self-pay

## 2021-10-19 ENCOUNTER — Other Ambulatory Visit (HOSPITAL_COMMUNITY): Payer: Self-pay

## 2021-10-19 MED ORDER — HYDROCODONE-ACETAMINOPHEN 5-325 MG PO TABS
1.0000 | ORAL_TABLET | Freq: Two times a day (BID) | ORAL | 0 refills | Status: DC | PRN
  Filled 2021-10-19: qty 60, 30d supply, fill #0

## 2021-10-21 ENCOUNTER — Encounter (HOSPITAL_COMMUNITY): Payer: Self-pay | Admitting: Orthopedic Surgery

## 2021-10-21 ENCOUNTER — Ambulatory Visit: Payer: BC Managed Care – PPO | Admitting: Neurology

## 2021-10-21 ENCOUNTER — Other Ambulatory Visit: Payer: Self-pay

## 2021-10-21 NOTE — Progress Notes (Signed)
For Short Stay: [place] appointment date: N/A Date of COVID positive in last 49 days:N/A  Bowel Prep reminder: N/A   For Anesthesia: PCP - [name], MD last office visit note 07/22/21 in epic Cardiologist - N/A Endocrinologist-Shamleffer, Melanie Crazier, MD last office visit 05/18/21 in epic Neurologist- Sater, Nanine Means, MD / Marcial Pacas, MD  last office visit 07/01/21 in epic  Chest x-ray - N/A EKG - N/A Stress Test - N/A ECHO - greater than 2 years ago Cardiac Cath - N/A Pacemaker/ICD device last checked: N/A Pacemaker orders received: N/A Device Rep notified: N/A  Spinal Cord Stimulator: N/A  Sleep Study - N/A CPAP - N/A  Fasting Blood Sugar - N/A Checks Blood Sugar ___N/A__ times a day Date and result of last Hgb A1c-N/A  Blood Thinner Instructions: N/A Aspirin Instructions:N/A Last Dose: N/A  Activity level: Able to exercise without chest pain and/or shortness of breath       Anesthesia review: N/A  Patient denies shortness of breath, fever, cough and chest pain at PAT appointment   Patient verbalized understanding of instructions that were given to them at the PAT appointment. Patient was also instructed that they will need to review over the PAT instructions again at home before surgery.

## 2021-10-22 ENCOUNTER — Observation Stay (HOSPITAL_COMMUNITY)
Admission: AD | Admit: 2021-10-22 | Discharge: 2021-10-23 | Disposition: A | Discharge status: Home Health | Payer: BC Managed Care – PPO | Attending: Orthopedic Surgery | Admitting: Orthopedic Surgery

## 2021-10-22 ENCOUNTER — Encounter (HOSPITAL_COMMUNITY): Payer: Self-pay | Admitting: Orthopedic Surgery

## 2021-10-22 ENCOUNTER — Ambulatory Visit (HOSPITAL_COMMUNITY): Payer: BC Managed Care – PPO | Admitting: Physician Assistant

## 2021-10-22 ENCOUNTER — Other Ambulatory Visit: Payer: Self-pay

## 2021-10-22 ENCOUNTER — Encounter (HOSPITAL_COMMUNITY)
Admission: AD | Disposition: A | Discharge status: Home Health | Payer: Self-pay | Source: Home / Self Care | Attending: Orthopedic Surgery

## 2021-10-22 ENCOUNTER — Other Ambulatory Visit (HOSPITAL_COMMUNITY): Payer: Self-pay

## 2021-10-22 ENCOUNTER — Inpatient Hospital Stay (HOSPITAL_COMMUNITY): Payer: BC Managed Care – PPO

## 2021-10-22 DIAGNOSIS — Z96612 Presence of left artificial shoulder joint: Secondary | ICD-10-CM | POA: Diagnosis not present

## 2021-10-22 DIAGNOSIS — Z87891 Personal history of nicotine dependence: Secondary | ICD-10-CM | POA: Insufficient documentation

## 2021-10-22 DIAGNOSIS — T84098A Other mechanical complication of other internal joint prosthesis, initial encounter: Secondary | ICD-10-CM | POA: Diagnosis not present

## 2021-10-22 DIAGNOSIS — Z01818 Encounter for other preprocedural examination: Secondary | ICD-10-CM

## 2021-10-22 DIAGNOSIS — T84038A Mechanical loosening of other internal prosthetic joint, initial encounter: Secondary | ICD-10-CM | POA: Diagnosis not present

## 2021-10-22 DIAGNOSIS — Z96611 Presence of right artificial shoulder joint: Secondary | ICD-10-CM | POA: Insufficient documentation

## 2021-10-22 DIAGNOSIS — E2749 Other adrenocortical insufficiency: Secondary | ICD-10-CM

## 2021-10-22 DIAGNOSIS — Z471 Aftercare following joint replacement surgery: Secondary | ICD-10-CM | POA: Diagnosis not present

## 2021-10-22 DIAGNOSIS — Z96619 Presence of unspecified artificial shoulder joint: Principal | ICD-10-CM

## 2021-10-22 DIAGNOSIS — M059 Rheumatoid arthritis with rheumatoid factor, unspecified: Secondary | ICD-10-CM | POA: Diagnosis not present

## 2021-10-22 DIAGNOSIS — Z96653 Presence of artificial knee joint, bilateral: Secondary | ICD-10-CM | POA: Diagnosis not present

## 2021-10-22 DIAGNOSIS — Z96651 Presence of right artificial knee joint: Secondary | ICD-10-CM | POA: Diagnosis not present

## 2021-10-22 DIAGNOSIS — G8918 Other acute postprocedural pain: Secondary | ICD-10-CM | POA: Diagnosis not present

## 2021-10-22 DIAGNOSIS — M65811 Other synovitis and tenosynovitis, right shoulder: Secondary | ICD-10-CM | POA: Diagnosis not present

## 2021-10-22 HISTORY — DX: Snoring: R06.83

## 2021-10-22 HISTORY — PX: TOTAL SHOULDER REVISION: SHX6130

## 2021-10-22 HISTORY — DX: Gastro-esophageal reflux disease without esophagitis: K21.9

## 2021-10-22 LAB — SURGICAL PCR SCREEN
MRSA, PCR: NEGATIVE
Staphylococcus aureus: NEGATIVE

## 2021-10-22 LAB — CBC
HCT: 37.5 % (ref 36.0–46.0)
Hemoglobin: 12.3 g/dL (ref 12.0–15.0)
MCH: 33.8 pg (ref 26.0–34.0)
MCHC: 32.8 g/dL (ref 30.0–36.0)
MCV: 103 fL — ABNORMAL HIGH (ref 80.0–100.0)
Platelets: 199 10*3/uL (ref 150–400)
RBC: 3.64 MIL/uL — ABNORMAL LOW (ref 3.87–5.11)
RDW: 13.2 % (ref 11.5–15.5)
WBC: 5.8 10*3/uL (ref 4.0–10.5)
nRBC: 0 % (ref 0.0–0.2)

## 2021-10-22 LAB — BASIC METABOLIC PANEL
Anion gap: 7 (ref 5–15)
BUN: 8 mg/dL (ref 6–20)
CO2: 22 mmol/L (ref 22–32)
Calcium: 8.9 mg/dL (ref 8.9–10.3)
Chloride: 108 mmol/L (ref 98–111)
Creatinine, Ser: 0.59 mg/dL (ref 0.44–1.00)
GFR, Estimated: >60 mL/min (ref >60)
Glucose, Bld: 82 mg/dL (ref 70–99)
Potassium: 4.1 mmol/L (ref 3.5–5.1)
Sodium: 137 mmol/L (ref 135–145)

## 2021-10-22 LAB — TYPE AND SCREEN
ABO/RH(D): A POS
Antibody Screen: NEGATIVE

## 2021-10-22 LAB — POCT PREGNANCY, URINE: Preg Test, Ur: NEGATIVE

## 2021-10-22 SURGERY — REVISION, TOTAL ARTHROPLASTY, SHOULDER
Anesthesia: Regional | Site: Shoulder | Laterality: Right

## 2021-10-22 MED ORDER — SUGAMMADEX SODIUM 200 MG/2ML IV SOLN
INTRAVENOUS | Status: DC | PRN
  Administered 2021-10-22: 200 mg via INTRAVENOUS

## 2021-10-22 MED ORDER — TRANEXAMIC ACID 1000 MG/10ML IV SOLN
2000.0000 mg | Freq: Once | INTRAVENOUS | Status: AC
  Administered 2021-10-22: 2000 mg via TOPICAL
  Filled 2021-10-22: qty 20

## 2021-10-22 MED ORDER — PROPOFOL 10 MG/ML IV BOLUS
INTRAVENOUS | Status: AC
  Filled 2021-10-22: qty 20

## 2021-10-22 MED ORDER — ROCURONIUM BROMIDE 10 MG/ML (PF) SYRINGE
PREFILLED_SYRINGE | INTRAVENOUS | Status: AC
  Filled 2021-10-22: qty 10

## 2021-10-22 MED ORDER — OXYCODONE HCL 5 MG/5ML PO SOLN: 5.0000 mg | Freq: Once | ORAL | Status: DC | PRN

## 2021-10-22 MED ORDER — BUPIVACAINE HCL (PF) 0.5 % IJ SOLN
INTRAMUSCULAR | Status: DC | PRN
  Administered 2021-10-22: 15 mL

## 2021-10-22 MED ORDER — VANCOMYCIN HCL 1000 MG IV SOLR
INTRAVENOUS | Status: DC | PRN
  Administered 2021-10-22: 1000 mg via TOPICAL

## 2021-10-22 MED ORDER — ONDANSETRON HCL 4 MG/2ML IJ SOLN: 4.0000 mg | Freq: Four times a day (QID) | INTRAMUSCULAR | Status: DC | PRN

## 2021-10-22 MED ORDER — DEXAMETHASONE SODIUM PHOSPHATE 10 MG/ML IJ SOLN
INTRAMUSCULAR | Status: DC | PRN
  Administered 2021-10-22: 8 mg via INTRAVENOUS

## 2021-10-22 MED ORDER — HYDROMORPHONE HCL 1 MG/ML IJ SOLN
0.5000 mg | INTRAMUSCULAR | Status: DC | PRN
  Administered 2021-10-23: 1 mg via INTRAVENOUS
  Filled 2021-10-22: qty 1

## 2021-10-22 MED ORDER — METHOCARBAMOL 500 MG PO TABS
500.0000 mg | ORAL_TABLET | Freq: Four times a day (QID) | ORAL | Status: DC | PRN
  Administered 2021-10-23 (×2): 500 mg via ORAL
  Filled 2021-10-22 (×2): qty 1

## 2021-10-22 MED ORDER — METHOCARBAMOL 500 MG IVPB - SIMPLE MED: 500.0000 mg | Freq: Four times a day (QID) | INTRAVENOUS | Status: DC | PRN

## 2021-10-22 MED ORDER — PROPOFOL 10 MG/ML IV BOLUS
INTRAVENOUS | Status: DC | PRN
  Administered 2021-10-22: 120 mg via INTRAVENOUS

## 2021-10-22 MED ORDER — EPHEDRINE SULFATE-NACL 50-0.9 MG/10ML-% IV SOSY
PREFILLED_SYRINGE | INTRAVENOUS | Status: DC | PRN
  Administered 2021-10-22 (×2): 5 mg via INTRAVENOUS

## 2021-10-22 MED ORDER — TRANEXAMIC ACID-NACL 1000-0.7 MG/100ML-% IV SOLN
1000.0000 mg | INTRAVENOUS | Status: AC
  Administered 2021-10-22: 1000 mg via INTRAVENOUS
  Filled 2021-10-22: qty 100

## 2021-10-22 MED ORDER — ONDANSETRON HCL 4 MG/2ML IJ SOLN
INTRAMUSCULAR | Status: AC
  Administered 2021-10-22: 4 mg via INTRAVENOUS
  Filled 2021-10-22: qty 2

## 2021-10-22 MED ORDER — OXYCODONE HCL 5 MG PO TABS
5.0000 mg | ORAL_TABLET | ORAL | Status: DC | PRN
  Administered 2021-10-23 (×2): 5 mg via ORAL
  Administered 2021-10-23: 10 mg via ORAL
  Filled 2021-10-22 (×2): qty 1
  Filled 2021-10-22: qty 2

## 2021-10-22 MED ORDER — POLYETHYLENE GLYCOL 3350 17 G PO PACK: 17.0000 g | PACK | Freq: Every day | ORAL | Status: DC | PRN

## 2021-10-22 MED ORDER — MAGNESIUM CITRATE PO SOLN: 1.0000 | Freq: Once | ORAL | Status: DC | PRN

## 2021-10-22 MED ORDER — ACETAMINOPHEN 160 MG/5ML PO SOLN: 325.0000 mg | ORAL | Status: DC | PRN

## 2021-10-22 MED ORDER — BUTALBITAL-APAP-CAFFEINE 50-325-40 MG PO TABS: 1.0000 | ORAL_TABLET | Freq: Four times a day (QID) | ORAL | Status: DC | PRN

## 2021-10-22 MED ORDER — STERILE WATER FOR IRRIGATION IR SOLN
Status: DC | PRN
  Administered 2021-10-22: 2000 mL

## 2021-10-22 MED ORDER — MENTHOL 3 MG MT LOZG: 1.0000 | LOZENGE | OROMUCOSAL | Status: DC | PRN

## 2021-10-22 MED ORDER — 0.9 % SODIUM CHLORIDE (POUR BTL) OPTIME
TOPICAL | Status: DC | PRN
  Administered 2021-10-22: 1000 mL

## 2021-10-22 MED ORDER — FENTANYL CITRATE (PF) 100 MCG/2ML IJ SOLN
INTRAMUSCULAR | Status: DC | PRN
  Administered 2021-10-22 (×2): 50 ug via INTRAVENOUS

## 2021-10-22 MED ORDER — ACETAMINOPHEN 325 MG PO TABS: 325.0000 mg | ORAL_TABLET | ORAL | Status: DC | PRN

## 2021-10-22 MED ORDER — VANCOMYCIN HCL IN DEXTROSE 1-5 GM/200ML-% IV SOLN
INTRAVENOUS | Status: AC
  Filled 2021-10-22: qty 200

## 2021-10-22 MED ORDER — OXYCODONE-ACETAMINOPHEN 5-325 MG PO TABS
1.0000 | ORAL_TABLET | ORAL | 0 refills | Status: DC | PRN
  Filled 2021-10-22: qty 40, 7d supply, fill #0

## 2021-10-22 MED ORDER — BISACODYL 5 MG PO TBEC: 5.0000 mg | DELAYED_RELEASE_TABLET | Freq: Every day | ORAL | Status: DC | PRN

## 2021-10-22 MED ORDER — DOCUSATE SODIUM 100 MG PO CAPS
100.0000 mg | ORAL_CAPSULE | Freq: Two times a day (BID) | ORAL | Status: DC
  Administered 2021-10-22 – 2021-10-23 (×2): 100 mg via ORAL
  Filled 2021-10-22 (×2): qty 1

## 2021-10-22 MED ORDER — ROCURONIUM BROMIDE 10 MG/ML (PF) SYRINGE
PREFILLED_SYRINGE | INTRAVENOUS | Status: DC | PRN
  Administered 2021-10-22 (×2): 10 mg via INTRAVENOUS
  Administered 2021-10-22: 60 mg via INTRAVENOUS

## 2021-10-22 MED ORDER — MIDAZOLAM HCL 2 MG/2ML IJ SOLN
1.0000 mg | INTRAMUSCULAR | Status: AC
  Administered 2021-10-22: 2 mg via INTRAVENOUS
  Filled 2021-10-22: qty 2

## 2021-10-22 MED ORDER — BUPIVACAINE LIPOSOME 1.3 % IJ SUSP
INTRAMUSCULAR | Status: DC | PRN
  Administered 2021-10-22: 10 mL via PERINEURAL

## 2021-10-22 MED ORDER — PHENYLEPHRINE HCL-NACL 20-0.9 MG/250ML-% IV SOLN
INTRAVENOUS | Status: DC | PRN
  Administered 2021-10-22: 40 ug/min via INTRAVENOUS

## 2021-10-22 MED ORDER — VANCOMYCIN HCL 1000 MG IV SOLR
INTRAVENOUS | Status: AC
  Filled 2021-10-22: qty 20

## 2021-10-22 MED ORDER — METOCLOPRAMIDE HCL 5 MG PO TABS: 5.0000 mg | ORAL_TABLET | Freq: Three times a day (TID) | ORAL | Status: DC | PRN

## 2021-10-22 MED ORDER — PREDNISONE 5 MG PO TABS
5.0000 mg | ORAL_TABLET | Freq: Every day | ORAL | Status: DC
  Administered 2021-10-22: 5 mg via ORAL
  Filled 2021-10-22: qty 1

## 2021-10-22 MED ORDER — ORAL CARE MOUTH RINSE: 15.0000 mL | Freq: Once | OROMUCOSAL | Status: AC

## 2021-10-22 MED ORDER — OXYCODONE HCL 5 MG PO TABS: 10.0000 mg | ORAL_TABLET | ORAL | Status: DC | PRN

## 2021-10-22 MED ORDER — KETAMINE HCL 10 MG/ML IJ SOLN
INTRAMUSCULAR | Status: DC | PRN
  Administered 2021-10-22: 30 mg via INTRAVENOUS

## 2021-10-22 MED ORDER — FENTANYL CITRATE PF 50 MCG/ML IJ SOSY
50.0000 ug | PREFILLED_SYRINGE | INTRAMUSCULAR | Status: AC
  Administered 2021-10-22: 100 ug via INTRAVENOUS
  Filled 2021-10-22: qty 2

## 2021-10-22 MED ORDER — ONDANSETRON HCL 4 MG/2ML IJ SOLN
INTRAMUSCULAR | Status: AC
  Filled 2021-10-22: qty 2

## 2021-10-22 MED ORDER — LIDOCAINE HCL (PF) 2 % IJ SOLN
INTRAMUSCULAR | Status: AC
  Filled 2021-10-22: qty 5

## 2021-10-22 MED ORDER — DIPHENHYDRAMINE HCL 12.5 MG/5ML PO ELIX: 12.5000 mg | ORAL_SOLUTION | ORAL | Status: DC | PRN

## 2021-10-22 MED ORDER — METOCLOPRAMIDE HCL 5 MG/ML IJ SOLN
5.0000 mg | Freq: Three times a day (TID) | INTRAMUSCULAR | Status: DC | PRN
  Administered 2021-10-22: 10 mg via INTRAVENOUS
  Filled 2021-10-22: qty 2

## 2021-10-22 MED ORDER — LACTATED RINGERS IV SOLN: INTRAVENOUS | Status: DC

## 2021-10-22 MED ORDER — ONDANSETRON HCL 4 MG PO TABS
4.0000 mg | ORAL_TABLET | Freq: Three times a day (TID) | ORAL | 0 refills | Status: DC | PRN
  Filled 2021-10-22: qty 10, 4d supply, fill #0

## 2021-10-22 MED ORDER — EPHEDRINE 5 MG/ML INJ
INTRAVENOUS | Status: AC
  Filled 2021-10-22: qty 5

## 2021-10-22 MED ORDER — SODIUM CHLORIDE 0.9 % IR SOLN
Status: DC | PRN
  Administered 2021-10-22: 3000 mL

## 2021-10-22 MED ORDER — DOXYCYCLINE HYCLATE 100 MG PO TABS
100.0000 mg | ORAL_TABLET | Freq: Two times a day (BID) | ORAL | Status: DC
  Administered 2021-10-22 – 2021-10-23 (×2): 100 mg via ORAL
  Filled 2021-10-22 (×2): qty 1

## 2021-10-22 MED ORDER — CHLORHEXIDINE GLUCONATE 0.12 % MT SOLN
15.0000 mL | Freq: Once | OROMUCOSAL | Status: AC
  Administered 2021-10-22: 15 mL via OROMUCOSAL

## 2021-10-22 MED ORDER — TEMAZEPAM 15 MG PO CAPS
15.0000 mg | ORAL_CAPSULE | Freq: Every evening | ORAL | Status: DC | PRN
  Filled 2021-10-22: qty 2

## 2021-10-22 MED ORDER — PANTOPRAZOLE SODIUM 20 MG PO TBEC
20.0000 mg | DELAYED_RELEASE_TABLET | Freq: Every day | ORAL | Status: DC
  Administered 2021-10-23: 20 mg via ORAL
  Filled 2021-10-22: qty 1

## 2021-10-22 MED ORDER — LIDOCAINE 2% (20 MG/ML) 5 ML SYRINGE
INTRAMUSCULAR | Status: DC | PRN
  Administered 2021-10-22: 40 mg via INTRAVENOUS

## 2021-10-22 MED ORDER — DEXAMETHASONE SODIUM PHOSPHATE 10 MG/ML IJ SOLN
INTRAMUSCULAR | Status: AC
  Filled 2021-10-22: qty 1

## 2021-10-22 MED ORDER — DOXYCYCLINE HYCLATE 100 MG PO CAPS
100.0000 mg | ORAL_CAPSULE | Freq: Two times a day (BID) | ORAL | 0 refills | Status: DC
  Filled 2021-10-22: qty 42, 21d supply, fill #0

## 2021-10-22 MED ORDER — ONDANSETRON HCL 4 MG/2ML IJ SOLN: 4.0000 mg | Freq: Once | INTRAMUSCULAR | Status: AC | PRN

## 2021-10-22 MED ORDER — MEPERIDINE HCL 50 MG/ML IJ SOLN: 6.2500 mg | INTRAMUSCULAR | Status: DC | PRN

## 2021-10-22 MED ORDER — VANCOMYCIN HCL 1000 MG IV SOLR
INTRAVENOUS | Status: DC | PRN
  Administered 2021-10-22: 1000 mg via INTRAVENOUS

## 2021-10-22 MED ORDER — FENTANYL CITRATE (PF) 100 MCG/2ML IJ SOLN
INTRAMUSCULAR | Status: AC
  Filled 2021-10-22: qty 2

## 2021-10-22 MED ORDER — KETAMINE HCL 50 MG/5ML IJ SOSY
PREFILLED_SYRINGE | INTRAMUSCULAR | Status: AC
  Filled 2021-10-22: qty 5

## 2021-10-22 MED ORDER — OXYCODONE HCL 5 MG PO TABS: 5.0000 mg | ORAL_TABLET | Freq: Once | ORAL | Status: DC | PRN

## 2021-10-22 MED ORDER — ACETAMINOPHEN 325 MG PO TABS
325.0000 mg | ORAL_TABLET | Freq: Four times a day (QID) | ORAL | Status: DC | PRN
  Administered 2021-10-23: 650 mg via ORAL
  Filled 2021-10-22: qty 2

## 2021-10-22 MED ORDER — ONDANSETRON HCL 4 MG PO TABS: 4.0000 mg | ORAL_TABLET | Freq: Four times a day (QID) | ORAL | Status: DC | PRN

## 2021-10-22 MED ORDER — PHENOL 1.4 % MT LIQD: 1.0000 | OROMUCOSAL | Status: DC | PRN

## 2021-10-22 MED ORDER — CEFAZOLIN SODIUM-DEXTROSE 2-4 GM/100ML-% IV SOLN
2.0000 g | Freq: Once | INTRAVENOUS | Status: AC
  Administered 2021-10-22: 2 g via INTRAVENOUS
  Filled 2021-10-22: qty 100

## 2021-10-22 MED ORDER — FENTANYL CITRATE PF 50 MCG/ML IJ SOSY: 25.0000 ug | PREFILLED_SYRINGE | INTRAMUSCULAR | Status: DC | PRN

## 2021-10-22 SURGICAL SUPPLY — 83 items
ADH SKN CLS APL DERMABOND .7 (GAUZE/BANDAGES/DRESSINGS) ×1
AID PSTN UNV HD RSTRNT DISP (MISCELLANEOUS) ×1
BAG COUNTER SPONGE SURGICOUNT (BAG) IMPLANT
BAG SPEC THK2 15X12 ZIP CLS (MISCELLANEOUS) ×1
BAG SPNG CNTER NS LX DISP (BAG)
BAG ZIPLOCK 12X15 (MISCELLANEOUS) ×1 IMPLANT
BLADE SAW SGTL 83.5X18.5 (BLADE) IMPLANT
BRUSH FEMORAL CANAL (MISCELLANEOUS) IMPLANT
CAP LOCKING COCR (Cap) IMPLANT
COMP GLENOID PROX FIX 9 (Orthopedic Implant) ×1 IMPLANT
COMPONENT GLENOID PROX FIX 9 (Orthopedic Implant) IMPLANT
COOLER ICEMAN CLASSIC (MISCELLANEOUS) IMPLANT
COVER BACK TABLE 60X90IN (DRAPES) ×1 IMPLANT
COVER SURGICAL LIGHT HANDLE (MISCELLANEOUS) ×1 IMPLANT
DERMABOND ADVANCED .7 DNX12 (GAUZE/BANDAGES/DRESSINGS) ×1 IMPLANT
DRAPE INCISE IOBAN 66X45 STRL (DRAPES) IMPLANT
DRAPE ORTHO SPLIT 77X108 STRL (DRAPES) ×2
DRAPE SHEET LG 3/4 BI-LAMINATE (DRAPES) ×1 IMPLANT
DRAPE SURG 17X11 SM STRL (DRAPES) ×1 IMPLANT
DRAPE SURG ORHT 6 SPLT 77X108 (DRAPES) ×2 IMPLANT
DRAPE U-SHAPE 47X51 STRL (DRAPES) ×1 IMPLANT
DRESSING PEEL AND PLC PRVNA 13 (GAUZE/BANDAGES/DRESSINGS) IMPLANT
DRESSING PREVENA PLUS CUSTOM (GAUZE/BANDAGES/DRESSINGS) IMPLANT
DRSG AQUACEL AG ADV 3.5X10 (GAUZE/BANDAGES/DRESSINGS) ×1 IMPLANT
DRSG PEEL AND PLACE PREVENA 13 (GAUZE/BANDAGES/DRESSINGS) ×1
DRSG PREVENA PLUS CUSTOM (GAUZE/BANDAGES/DRESSINGS) ×1
DRSG TEGADERM 8X12 (GAUZE/BANDAGES/DRESSINGS) ×1 IMPLANT
DURAPREP 26ML APPLICATOR (WOUND CARE) ×1 IMPLANT
ELECT BLADE TIP CTD 4 INCH (ELECTRODE) ×1 IMPLANT
ELECT PENCIL ROCKER SW 15FT (MISCELLANEOUS) ×1 IMPLANT
ELECT REM PT RETURN 15FT ADLT (MISCELLANEOUS) ×1 IMPLANT
FACESHIELD WRAPAROUND (MASK) ×3 IMPLANT
FACESHIELD WRAPAROUND OR TEAM (MASK) ×3 IMPLANT
GLENOSPHERE 36 +4 LAT/24 (Joint) IMPLANT
GLOVE BIO SURGEON STRL SZ7 (GLOVE) ×1 IMPLANT
GLOVE BIO SURGEON STRL SZ7.5 (GLOVE) ×1 IMPLANT
GLOVE BIO SURGEON STRL SZ8 (GLOVE) ×1 IMPLANT
GLOVE BIOGEL PI IND STRL 7.0 (GLOVE) ×1 IMPLANT
GLOVE BIOGEL PI IND STRL 8 (GLOVE) ×1 IMPLANT
GOWN STRL SURGICAL XL XLNG (GOWN DISPOSABLE) ×2 IMPLANT
HANDPIECE INTERPULSE COAX TIP (DISPOSABLE) ×1
INSERT HUMERAL 36X6MM 12.5DEG (Insert) IMPLANT
KIT BASIN OR (CUSTOM PROCEDURE TRAY) ×1 IMPLANT
KIT DRSG PREVENA PLUS 7DAY 125 (MISCELLANEOUS) IMPLANT
KIT TURNOVER KIT A (KITS) IMPLANT
MANIFOLD NEPTUNE II (INSTRUMENTS) ×1 IMPLANT
NDL TAPERED W/ NITINOL LOOP (MISCELLANEOUS) IMPLANT
NEEDLE TAPERED W/ NITINOL LOOP (MISCELLANEOUS) IMPLANT
NS IRRIG 1000ML POUR BTL (IV SOLUTION) ×1 IMPLANT
PACK SHOULDER (CUSTOM PROCEDURE TRAY) ×1 IMPLANT
PAD ARMBOARD 7.5X6 YLW CONV (MISCELLANEOUS) ×2 IMPLANT
PAD COLD SHLDR WRAP-ON (PAD) IMPLANT
PROTECTOR NERVE ULNAR (MISCELLANEOUS) ×1 IMPLANT
RESTRAINT HEAD UNIVERSAL NS (MISCELLANEOUS) ×1 IMPLANT
SCREW ASSEMBLY FLEX SHLD (Screw) IMPLANT
SCREW PERI LOCK 5.5X16 (Screw) IMPLANT
SCREW PERIPHERAL 5.5X20 LOCK (Screw) IMPLANT
SCREW PERIPHERAL 5.5X28 LOCK (Screw) IMPLANT
SET HNDPC FAN SPRY TIP SCT (DISPOSABLE) IMPLANT
SLING ARM FOAM STRAP LRG (SOFTGOODS) IMPLANT
SLING ARM FOAM STRAP MED (SOFTGOODS) IMPLANT
SMARTMIX MINI TOWER (MISCELLANEOUS)
SPACER AEQUALIS REV 9X20 (Spacer) IMPLANT
SPACER SHLD CMT AEQ 9X30 (Shoulder) IMPLANT
SPONGE T-LAP 4X18 ~~LOC~~+RFID (SPONGE) IMPLANT
STEM DIST PTC 9X90 (Stem) IMPLANT
SUCTION FRAZIER HANDLE 12FR (TUBING) ×1
SUCTION TUBE FRAZIER 12FR DISP (TUBING) ×1 IMPLANT
SUT MNCRL AB 3-0 PS2 18 (SUTURE) ×1 IMPLANT
SUT MON AB 2-0 CT1 36 (SUTURE) ×1 IMPLANT
SUT VIC AB 1 CT1 36 (SUTURE) ×1 IMPLANT
SUT VIC AB 2-0 CT1 27 (SUTURE) ×1
SUT VIC AB 2-0 CT1 TAPERPNT 27 (SUTURE) ×1 IMPLANT
SUTURE TAPE 1.3 40 TPR END (SUTURE) IMPLANT
SUTURETAPE 1.3 40 TPR END (SUTURE)
SWAB COLLECTION DEVICE MRSA (MISCELLANEOUS) IMPLANT
SWAB CULTURE ESWAB REG 1ML (MISCELLANEOUS) IMPLANT
TOWEL OR 17X26 10 PK STRL BLUE (TOWEL DISPOSABLE) ×1 IMPLANT
TOWEL OR NON WOVEN STRL DISP B (DISPOSABLE) ×1 IMPLANT
TOWER SMARTMIX MINI (MISCELLANEOUS) IMPLANT
TRAY TIB REV AEQ 0MM (Shoulder) IMPLANT
WATER STERILE IRR 1000ML POUR (IV SOLUTION) ×1 IMPLANT
YANKAUER SUCT BULB TIP 10FT TU (MISCELLANEOUS) ×1 IMPLANT

## 2021-10-22 NOTE — Discharge Instructions (Signed)
Anne Garrett. Supple, M.D., F.A.A.O.S. Orthopaedic Surgery Specializing in Arthroscopic and Reconstructive Surgery of the Shoulder [phone] [address] [place], [place] [address] - Fax [phone]   POST-OP TOTAL SHOULDER REPLACEMENT INSTRUCTIONS  1. Follow up in the office next Friday. If you do not already have a scheduled appointment, our office will contact you to schedule.  2. The vac dressing is to stay in place and we will see you in the office next Friday to remove   3. Wear your sling/immobilizer at all times except to perform the exercises below or to occasionally let your arm dangle by your side to stretch your elbow. You also need to sleep in your sling immobilizer until instructed otherwise. It is ok to remove your sling if you are sitting in a controlled environment and allow your arm to rest in a position of comfort by your side or on your lap with pillows to give your neck and skin a break from the sling. You may remove it to allow arm to dangle by side to shower. If you are up walking around and when you go to sleep at night you need to wear it.  4. Range of motion to your elbow, wrist, and hand are encouraged 3-5 times daily. Exercise to your hand and fingers helps to reduce swelling you may experience.   5. Prescriptions for a pain medication and a muscle relaxant are provided for you. It is recommended that if you are experiencing pain that you pain medication alone is not controlling, add the muscle relaxant along with the pain medication which can give additional pain relief. The first 1-2 days is generally the most severe of your pain and then should gradually decrease. As your pain lessens it is recommended that you decrease your use of the pain medications to an "as needed basis'" only and to always comply with the recommended dosages of the pain medications.  6. Pain medications can produce constipation along with their use. If you experience this, the  use of an over the counter stool softener or laxative daily is recommended.   7. For additional questions or concerns, please do not hesitate to call the office. If after hours there is an answering service to forward your concerns to the physician on call.  8.Pain control following an exparel block  To help control your post-operative pain you received a nerve block  performed with Exparel which is a long acting anesthetic (numbing agent) which can provide pain relief and sensations of numbness (and relief of pain) in the operative shoulder and arm for up to 3 days. Sometimes it provides mixed relief, meaning you may still have numbness in certain areas of the arm but can still be able to move  parts of that arm, hand, and fingers. We recommend that your prescribed pain medications  be used as needed. We do not feel it is necessary to "pre medicate" and "stay ahead" of pain.  Taking narcotic pain medications when you are not having any pain can lead to unnecessary and potentially dangerous side effects.    9. Use the ice machine as much as possible in the first 5-7 days from surgery, then you can wean its use to as needed. The ice typically needs to be replaced every 6 hours, instead of ice you can actually freeze water bottles to put in the cooler and then fill water around them to avoid having to purchase ice. You can have spare water bottles freezing  to allow you to rotate them once they have melted. Try to have a thin shirt or light cloth or towel under the ice wrap to protect your skin.   FOR ADDITIONAL INFO ON ICE MACHINE AND INSTRUCTIONS GO TO THE WEBSITE AT  [email or web address]  10.  We recommend that you avoid any dental work or cleaning in the first 3 months following your joint replacement. This is to help minimize the possibility of infection from the bacteria in your mouth that enters your bloodstream during dental work. We also recommend that  you take an antibiotic prior to your dental work for the first year after your shoulder replacement to further help reduce that risk. Please simply contact our office for antibiotics to be sent to your pharmacy prior to dental work.  11. Dental Antibiotics:  We recommend waiting at least 3 months for any dental work even cleanings unless there is a IT consultant. We also recommend  prophylactic antibiotics for all dental procdeures  the first year following your joint replacement. In some exceptions we recommend them to be used lifelong. We will provide you with that prescription in follow up office visits, or you can call our office.  Exceptions are as follows:  1. History of prior total joint infection  2. Severely immunocompromised (Organ Transplant, cancer chemotherapy, Rheumatoid biologic meds such as [place])  3. Poorly controlled diabetes (A1C &gt; 8.0, blood glucose over 200)   POST-OP EXERCISES  OK to allow arm to dangle and move elbow wrist

## 2021-10-22 NOTE — Transfer of Care (Signed)
Immediate Anesthesia Transfer of Care Note  Patient: Alyra Patty  Procedure(s) Performed: Revision Right shoulder reverse arthroplasty (Right: Shoulder)  Patient Location: PACU  Anesthesia Type:General  Level of Consciousness: awake, alert , oriented and patient cooperative  Airway & Oxygen Therapy: Patient Spontanous Breathing and Patient connected to face mask oxygen  Post-op Assessment: Report given to RN and Post -op Vital signs reviewed and stable  Post vital signs: Reviewed and stable  Last Vitals:  Vitals Value Taken Time  BP 124/69 10/22/21 1746  Temp    Pulse 98 10/22/21 1747  Resp 18 10/22/21 1747  SpO2 94 % 10/22/21 1747  Vitals shown include unvalidated device data.  Last Pain:  Vitals:   10/22/21 1315  TempSrc:   PainSc: 0-No pain         Complications: No notable events documented.

## 2021-10-22 NOTE — Anesthesia Preprocedure Evaluation (Addendum)
Anesthesia Evaluation  Patient identified by MRN, date of birth, ID band Patient awake    Reviewed: Allergy & Precautions, H&P , NPO status , Patient's Chart, lab work & pertinent test results  History of Anesthesia Complications (+) DIFFICULT AIRWAY and history of anesthetic complications  Airway Mallampati: III  TM Distance: <3 FB Neck ROM: Full    Dental no notable dental hx. (+) Teeth Intact, Dental Advisory Given   Pulmonary former smoker,    Pulmonary exam normal breath sounds clear to auscultation       Cardiovascular Exercise Tolerance: Good negative cardio ROS Normal cardiovascular exam Rhythm:Regular Rate:Normal     Neuro/Psych  Headaches, negative psych ROS   GI/Hepatic Neg liver ROS, GERD  Medicated,  Endo/Other  negative endocrine ROS  Renal/GU K+ 4.0 Cr 0.54     Musculoskeletal  (+) Arthritis , Rheumatoid disorders,    Abdominal   Peds  Hematology negative hematology ROS (+) hgb 12.0 Plt 322  T&S available   Anesthesia Other Findings Preoxygenation: Pre-oxygenation with 100% oxygen Induction Type: IV induction Ventilation: Mask ventilation without difficulty Laryngoscope Size: Mac and 3 Grade View: Grade II Tube type: Oral Tube size: 7.0 mm Number of attempts: 2 Airway Equipment and Method: Stylet and Video-laryngoscopy Placement Confirmation: ETT inserted through vocal cords under direct vision,  positive ETCO2 and breath sounds checked- equal and bilateral Secured at: 20 cm Tube secured with: Tape Dental Injury: Teeth and Oropharynx as per pre-operative assessment  Difficulty Due To: Difficult Airway- due to anterior larynx and Difficult Airway- due to immobile epiglottis  Reproductive/Obstetrics negative OB ROS                           Anesthesia Physical  Anesthesia Plan  ASA: 3  Anesthesia Plan: General and Regional   Post-op Pain Management: Regional  block* and Minimal or no pain anticipated   Induction: Intravenous  PONV Risk Score and Plan: 4 or greater and Treatment may vary due to age or medical condition, Midazolam and Ondansetron  Airway Management Planned: Oral ETT, LMA and Video Laryngoscope Planned  Additional Equipment: None  Intra-op Plan:   Post-operative Plan: Extubation in OR  Informed Consent: I have reviewed the patients History and Physical, chart, labs and discussed the procedure including the risks, benefits and alternatives for the proposed anesthesia with the patient or authorized representative who has indicated his/her understanding and acceptance.     Dental advisory given  Plan Discussed with: CRNA and Anesthesiologist  Anesthesia Plan Comments: (  )       Anesthesia Quick Evaluation

## 2021-10-22 NOTE — Progress Notes (Signed)
Pt has arrived to 1322 from PACU s/p right shoulder reverse revision arthroplasty.  Report accepted from [facility].  Pt is alert and oriented.  Has no complaints of pain.  Room orientation completed with call bell placed at bedside.  Care continues.

## 2021-10-22 NOTE — H&P (Signed)
Anne Garrett    Chief Complaint: Painful and potentially loose right shoulder reverse arthroplasty HPI: The patient is a 46 y.o. female well-known to our practice with a long history of multiple arthropathies related to underlying juvenile rheumatoid arthritis.  She is just over 2 years out from a revision right shoulder reverse arthroplasty, following which she had a period of persistent drainage from which she had persistent drainage requiring an irrigation and debridement and placement of a vacuum-assisted closure device, ultimately achieving wound healing.  She did initially fairly well but now has presented with progressive increasing right shoulder pain with radiographs suggestive of loosening of her prosthesis and some bone resorption concerning for possible deep infection.  Her work-up to date has included laboratory studies which show all of her inflammatory parameters to be within normal limits.  Recent bone scan did show some increased uptake about the humeral stem concerning for loosening versus infection.  Due to her increasing pain she is brought to the operating room this time for planned right shoulder exploration irrigation and debridement with anticipated revision arthroplasty versus implantation of an antibiotic impregnated cement spacer.  Past Medical History:  Diagnosis Date   Adrenal insufficiency ([place])    secondary to Prednisone   Eczema    coner oif eye lid   GERD (gastroesophageal reflux disease)    Headache    migraines   Intervertebral disk disease    Juvenile idiopathic arthritis ([place])    LGSIL (low grade squamous intraepithelial dysplasia) 08/20/2013   Formatting of this note might be different from the original. On pap smear June 2015.   HR HPV positive.  Colposcopy and cervical AND vaginal biopsies done Aug 2015   Osteoporosis    Rheumatoid arthritis ([place])    Juvenile onset   Snores    negative for OSA but uses dental mouth guard nightly      Past  Surgical History:  Procedure Laterality Date   ANKLE ARTHROPLASTY Right    ANTERIOR CERVICAL DECOMP/DISCECTOMY FUSION N/A 06/19/2015   Procedure: C3-4 C4-5 Anterior cervical decompression/diskectomy/fusion;  Surgeon: Jovita Gamma, MD;  Location: [facility];  Service: Neurosurgery;  Laterality: N/A;  C3-4 C4-5 Anterior cervical decompression/diskectomy/fusion   ANTERIOR CERVICAL DECOMP/DISCECTOMY FUSION  07/27/2021   APPENDECTOMY     CARPAL TUNNEL RELEASE Right    CESAREAN SECTION     CHOLECYSTECTOMY     CHOLECYSTECTOMY  2006   COLONOSCOPY     ESSURE TUBAL LIGATION     Essure Implant-Permanent birth control   excision to palm Left    FOOT ARTHRODESIS Left 03/01/2019   Procedure: Left talonavicular and naviculocuneiform arthrodesis;  Surgeon: [name], MD;  Location: [facility];  Service: Orthopedics;  Laterality: Left;   HARDWARE REMOVAL Right 02/02/2017   Procedure: RIGHT WRIST DEEP IMPLANT REMOVAL, RIGHT WRIST AND HAND EXTENSOR TENDON RECONSTRUCTION, TENDON TRANSFER AND RECONSTRUCTION AS INDICATED;  Surgeon: Iran Planas, MD;  Location: [facility];  Service: Orthopedics;  Laterality: Right;   IRRIGATION AND DEBRIDEMENT SHOULDER Right 11/13/2019   Procedure: IRRIGATION AND DEBRIDEMENT RIGHT SHOULDER WITH WOUND CLOSURE;  Surgeon: [name], MD;  Location: [facility];  Service: Orthopedics;  Laterality: Right;  67mn   JOINT REPLACEMENT Bilateral    bilat hip replacements   KNEE ARTHROPLASTY Right 08/25/2020   Procedure: COMPUTER ASSISTED TOTAL KNEE ARTHROPLASTY;  Surgeon: AGaynelle Arabian MD;  Location: [facility];  Service: Orthopedics;  Laterality: Right;   ORIF FEMUR FRACTURE Right    OTHER SURGICAL HISTORY  06/04/2016  Revision of PLIF   plif l5-s1 arthrodeisis    2018   POSTERIOR CERVICAL FUSION/FORAMINOTOMY N/A 07/12/2015   Procedure: C2 to C5 Cervical laminectomy, C2 to C5 posterior cervical arthrodesis with instrumentation and bone graft;  Surgeon: Jovita Gamma,  MD;  Location: [facility];  Service: Neurosurgery;  Laterality: N/A;  C2 to C5 Cervical laminectomy, C2  to C5 posterior cervical arthrodesis with instrumentation and bone graft   REPAIR EXTENSOR TENDON Right 02/02/2017   Procedure: REPAIR EXTENSOR TENDON;  Surgeon: Iran Planas, MD;  Location: [facility];  Service: Orthopedics;  Laterality: Right;   REVERSE SHOULDER ARTHROPLASTY Left 09/07/2018   Procedure: REVERSE SHOULDER ARTHROPLASTY;  Surgeon: [name], MD;  Location: [facility];  Service: Orthopedics;  Laterality: Left;   REVERSE SHOULDER ARTHROPLASTY Right 09/20/2019   Procedure: REVERSE SHOULDER ARTHROPLASTY;  Surgeon: [name], MD;  Location: [facility];  Service: Orthopedics;  Laterality: Right;  148mn   REVISION TOTAL SHOULDER TO REVERSE TOTAL SHOULDER Right 11/01/2019   Procedure: revision right total shoulder to reverse total shoulder;  Surgeon: SJustice Britain MD;  Location: [facility];  Service: Orthopedics;  Laterality: Right;  exparel block   right foot surgery Right    right foot arthrodesis and right ankle arthroplasty   TOTAL KNEE ARTHROPLASTY Left 04/21/2020   Procedure: TOTAL KNEE ARTHROPLASTY;  Surgeon: AGaynelle Arabian MD;  Location: [facility];  Service: Orthopedics;  Laterality: Left;  573m   WRIST SURGERY Bilateral    arthrodesis    Family History  Problem Relation Age of Onset   Breast cancer Mother [id number] Nephritis Father    Epilepsy Father    Breast cancer Maternal Aunt        2 mat aunts    Multiple sclerosis Maternal Uncle    Psoriasis Paternal Aunt    Diabetes Daughter     Social History:  reports that she quit smoking about 6 years ago. Her smoking use included cigarettes. She has a 5.00 pack-year smoking history. She has been exposed to tobacco smoke. She has never used smokeless tobacco. She reports current alcohol use of about 2.0 standard drinks of alcohol per week. She reports that she does not use drugs.  BMI: Estimated body mass index is 24.52 kg/m as  calculated from the following:   Height as of this encounter: 4' 11.5" (1.511 m).   Weight as of this encounter: 56 kg.    Diabetes: Patient does not have a diagnosis of diabetes.     Smoking Status: Social History   Tobacco Use  Smoking Status Former   Packs/day: 0.25   Years: 20.00   Total pack years: 5.00   Types: Cigarettes   Quit date: 07/02/2015   Years since quitting: 6.3   Passive exposure: Past  Smokeless Tobacco Never   The patient is not currently a tobacco user. Counseling given: Not Answered      Medications Prior to Admission  Medication Sig Dispense Refill   amoxicillin (AMOXIL) 500 MG capsule Take 4 capsules (2,000 mg total) by mouth 1 hour before dental appt for pre-med 8 capsule 1   BIOTIN PO Take 1 tablet by mouth daily.     butalbital-acetaminophen-caffeine (FIORICET) 50-325-40 MG tablet Take 1 tablet by mouth every 6  hours as needed for headache. 30 tablet 5   Calcium Carb-Cholecalciferol (CALCIUM+D3 PO) Take 1 tablet by mouth daily.     Cholecalciferol (VITAMIN D3) 50 MCG (2000 UT) TABS Take 2,000 Units by mouth daily.  HYDROcodone-acetaminophen (NORCO/VICODIN) 5-325 MG tablet Take 1 tablet by mouth  twice daily as needed for pain (Patient taking differently: Take 1 tablet by mouth 2 (two) times daily as needed for severe pain.) 60 tablet 0   methocarbamol (ROBAXIN) 500 MG tablet Take 1 tablet (500 mg total) by mouth 4 (four) times daily. (Patient taking differently: Take 500 mg by mouth every 6 (six) hours as needed for muscle spasms.) 45 tablet 0   Multiple Vitamin (MULTIVITAMIN) capsule Take 1 capsule by mouth daily.     pantoprazole (PROTONIX) 20 MG tablet Take 1 tablet (20 mg total) by mouth daily. 90 tablet 0   predniSONE (DELTASONE) 5 MG tablet Take 1-2 tablets (5-10 mg total) by mouth daily. (Patient taking differently: Take 5 mg by mouth at bedtime.) 180 tablet 1   Tocilizumab (ACTEMRA ACTPEN) 162 MG/0.9ML SOAJ AS DIRECTED SUBCUTANEOUS WEEKLY.  (Patient taking differently: Inject 162 mg into the skin every Tuesday.) 3.6 mL 2   naproxen sodium (ALEVE) 220 MG tablet Take 220 mg by mouth daily as needed (aches and pains).     PRESCRIPTION MEDICATION Apply 1 application topically See admin instructions. Apply dime-size amount to face 1-2 TIMES per day as needed rosacea   Metronidazole 1% + Ivermectin 1% + Azelaic Acid 15%     triamcinolone cream (KENALOG) 0.1 % Apply 1 application topically 2 (two) times daily as needed. (Patient taking differently: Apply 1 application  topically 2 (two) times daily as needed (eczema).) 15 g 1     Physical Exam: Patient is a petite female demonstrating multiple stigmata's of her known long-term juvenile rheumatoid arthritis with multiple previous surgical procedures of all major joints as well as fusions of her wrists and digital contractures.  Inspection of the right shoulder demonstrates the anterior shoulder incision to be well-healed but there is significant atrophy of the subcutaneous tissues and musculature along the margins of the incision with a broad scar.  However there is no erythema, induration, or fluctuance.  Minimal tenderness.  Markedly decreased motion with complaints of pain with both active and passive shoulder motion.  Otherwise grossly neurovascular intact in the right upper extremity.  Plain radiographs are reviewed and compared to her initial postop films which do show some evidence for resorption of the bone about the proximal humerus.  The glenosphere and baseplate appears to be stable in its position.    Vitals  Temp:  [97.6 F (36.4 C)] 97.6 F (36.4 C) (10/19 1104) Pulse Rate:  [53-80] 53 (10/19 1249) Resp:  [12-18] 12 (10/19 1249) BP: (120-126)/(82-85) 126/82 (10/19 1244) SpO2:  [98 %-100 %] 99 % (10/19 1249) Weight:  [56 kg-56.7 kg] 56 kg (10/19 1151)  Assessment/Plan  Impression: Painful and potentially loose right shoulder reverse arthroplasty with possible deep  infection.  Plan of Action: Procedure(s): Revision Right shoulder reverse arthroplasty versus implant removal and placement of cement spacer  Jermari Tamargo M Syenna Nazir 10/22/2021, 1:13 PM Contact # [phone]

## 2021-10-22 NOTE — Anesthesia Procedure Notes (Signed)
Anesthesia Regional Block: Interscalene brachial plexus block   Pre-Anesthetic Checklist: , timeout performed,  Correct Patient, Correct Site, Correct Laterality,  Correct Procedure, Correct Position, site marked,  Risks and benefits discussed,  Surgical consent,  Pre-op evaluation,  At surgeon's request and post-op pain management  Laterality: Right  Prep: chloraprep       Needles:  Injection technique: Single-shot  Needle Type: Echogenic Stimulator Needle     Needle Length: 5cm  Needle Gauge: 22     Additional Needles:   Procedures:, nerve stimulator,,, ultrasound used (permanent image in chart),,     Nerve Stimulator or Paresthesia:  Response: hand, 0.45 mA  Additional Responses:   Narrative:  Start time: 10/22/2021 12:50 PM End time: 10/22/2021 12:55 PM Injection made incrementally with aspirations every 5 mL.  Performed by: Personally  Anesthesiologist: Janeece Riggers, MD  Additional Notes: Functioning IV was confirmed and monitors were applied.  A 66m 22ga Arrow echogenic stimulator needle was used. Sterile prep and drape,hand hygiene and sterile gloves were used. Ultrasound guidance: relevant anatomy identified, needle position confirmed, local anesthetic spread visualized around nerve(s)., vascular puncture avoided.  Image printed for medical record. Negative aspiration and negative test dose prior to incremental administration of local anesthetic. The patient tolerated the procedure well.

## 2021-10-22 NOTE — Plan of Care (Signed)
  Problem: Education: Goal: Knowledge of General Education information will improve Description: Including pain rating scale, medication(s)/side effects and non-pharmacologic comfort measures Outcome: Progressing   Problem: Health Behavior/Discharge Planning: Goal: Ability to manage health-related needs will improve Outcome: Progressing   Problem: Clinical Measurements: Goal: Ability to maintain clinical measurements within normal limits will improve Outcome: Progressing Goal: Will remain free from infection Outcome: Progressing Goal: Diagnostic test results will improve Outcome: Progressing Goal: Respiratory complications will improve Outcome: Progressing Goal: Cardiovascular complication will be avoided Outcome: Progressing   Problem: Activity: Goal: Risk for activity intolerance will decrease Outcome: Progressing   Problem: Nutrition: Goal: Adequate nutrition will be maintained Outcome: Progressing   Problem: Coping: Goal: Level of anxiety will decrease Outcome: Progressing   Problem: Elimination: Goal: Will not experience complications related to bowel motility Outcome: Progressing Goal: Will not experience complications related to urinary retention Outcome: Progressing   Problem: Pain Managment: Goal: General experience of comfort will improve Outcome: Progressing   Problem: Safety: Goal: Ability to remain free from injury will improve Outcome: Progressing   Problem: Education: Goal: Knowledge of the prescribed therapeutic regimen will improve Outcome: Progressing Goal: Understanding of activity limitations/precautions following surgery will improve Outcome: Progressing Goal: Individualized Educational Video(s) Outcome: Progressing   Problem: Activity: Goal: Ability to tolerate increased activity will improve Outcome: Progressing   Problem: Pain Management: Goal: Pain level will decrease with appropriate interventions Outcome: Progressing

## 2021-10-22 NOTE — Op Note (Signed)
10/22/2021  5:28 PM  PATIENT:   Anne Garrett  46 y.o. female  PRE-OPERATIVE DIAGNOSIS: Painful loose versus infected right shoulder reverse arthroplasty  POST-OPERATIVE DIAGNOSIS: As above with intraoperative findings suggestive of a loose humeral stem  PROCEDURE: Right shoulder revision reverse arthroplasty with explantation of original prosthetic elements with the exception of the glenoid baseplate, exploration irrigation and debridement of joint space humeral canal and glenoid and revision reverse arthroplasty with the use of a Stryker revive stem size 9 distal stem with 50 mm extension in a standard metaphysis with a neutral baseplate and +3 retentive polyethylene insert, and the reimplantation of an Arthrex 36/+4 glenosphere as well as the reapplication of locking screws onto the original baseplate  SURGEON:  Joniya Boberg, Metta Clines M.D.  ASSISTANTS: Jenetta Loges, PA-C  Jenetta Loges, PA-C was utilized as an Environmental consultant throughout this case, essential for help with positioning the patient, positioning extremity, tissue manipulation, implantation of the prosthesis, suture management, wound closure, and intraoperative decision-making.  ANESTHESIA:   General endotracheal and interscalene block with Exparel  EBL: 100 cc  SPECIMEN: Deep tissue from the humeral metaphysis was sent for frozen section.  This was read as showing no evidence for acute inflammation.  Additionally, 5 tissue samples from the deep aspects of the joint including the humeral metaphysis, the humeral canal, and around the glenoid baseplate were all sent for routine culture and sensitivity  Drains: None   PATIENT DISPOSITION:  PACU - hemodynamically stable.    PLAN OF CARE: Admit for overnight observation  Brief history:  Patient is a 46 year old female with a long history of multiple joint difficulties related to juvenile rheumatoid arthritis.  She underwent an index right shoulder reverse arthroplasty that I  performed approximately 2 years ago and had an unfortunate subsidence of her humeral stem with subsequent instability requiring a revision surgery.  Following the revision surgery she developed significant drainage which required exploration irrigation and debridement as well as utilization of a wound VAC for closure of the incision.  She ultimately went on to healing and did well until approximately 6 months ago at which she developed recurrent right shoulder pain which is becoming progressively more severe.  Current radiographs suggest evidence for some bone resorption about the humeral stem with concern for loosening versus infection.  She underwent work-up including evaluation of all inflammatory parameters which were within normal limits.  Recent bone scan did show evidence for increased uptake about the humeral stem concerning for loosening versus infection.  Due to her ongoing pain she is brought to the operating room at this time for planned exploration and revision 1 stage versus two-stage.  Preoperatively, I counseled the patient regarding treatment options and risks versus benefits thereof.  Possible surgical complications were all reviewed including potential for bleeding, infection, neurovascular injury, persistent pain, loss of motion, anesthetic complication, failure of the implant, and possible need for additional surgery. They understand and accept and agrees with our planned procedure.  Procedure in detail:  After undergoing routine preop evaluation the patient received an interscalene block performed by the anesthesia service in the holding area.  Brought to the operating placed spine on the operating table and underwent the smooth induction of general endotracheal anesthesia.  Placed into the beachchair position and appropriately padded and protected.  The right shoulder girdle region was sterilely prepped and draped in standard fashion.  All prophylactic antibiotics were held until  cultures could be obtained.  Timeout was called.  A deltopectoral approach to the  right shoulder is made through the previous incision approximately 12 cm in length.  Skin flaps were elevated carefully allowing [place] to identify the deltopectoral interval which was scar fied and this was then developed from proximal to distal using electrocautery and carefully protecting the conjoined tendon medially and dissection then carried down to the capsular tissues around the implant and upon entry into the joint there is a moderate amount of serosanguineous fluid but certainly no gross purulence no foul smell no obvious pus.  We took samples of tissue around the humeral metaphysis at the bone implant junction both medial anterior and lateral.  We dissected along the proximal humerus in a subperiosteal fashion elevating soft tissue planes that allowed [place] to gain mobility about the implant and on further evaluation it became clear that the humeral stem was loose.  We carried the dissection proximally freeing up around the glenosphere and ultimately released the soft tissues such that the implant could be dislocated.  Once this was completed the implant was clearly found to be loose and could be withdrawn with her fingers.  At this point we curetted the humeral canal and sent this for culture and also sent tissue for frozen section which did indeed come back showing no evidence for acute inflammation.  We then placed a size 6 broach into the humeral canal to protect the bone and then exposed the glenosphere and performed a release of soft tissues about the glenosphere the glenosphere was then removed.  We then removed all the locking screws.  We attached the baseplate remover to the baseplate and found the baseplate was solidly fixed and it was my impression that by attempting to remove the baseplate we would destroy the glenoid vault bone and given the findings at the stage of the surgery that did not suggest that this was an  obvious deep infection we felt as though we could retain the baseplate safely and reasonably.  We did curette all of the holes for the peripheral locking screws.  With this then we replaced the peripheral locking screws with new screws and these were seated using standard technique with vancomycin powder applied to the tips of the lag screw threads in good purchase was achieved of all the screws.  A new 36/+4 glenosphere was then impacted onto the baseplate and central locking screw was placed.  We then turned our attention to the humeral canal and I should mention that the entire joint was copes irrigated with pulsatile lavage and all nonviable necrotic tissues were debrided free and clean for satisfaction.  The canal was curetted pulsatile he Avage and at this point we then brought in fluoroscopic imaging as we were beginning to reamed the canal to allow for diaphyseal fit of the Stryker implant.  Fluoroscopic images confirmed that there was a pedestal that we drilled through and then performed hand reaming ultimately to a size 11 more proximally but this construct did not give [place] the appropriate seating and so we downsized back to a 9 allowing [place] to see an implant deeper into the humeral shaft and the size 9 stem with a 50 mm spacer in a standard metaphysis was then impacted and this showed appropriate reduction with good stability good motion and good soft tissue balance.  This point the trial was removed.  Final irrigation completed.  The final construct was assembled again with a size 9 distal stem a 50 mm extension and a standard metaphysis.  This was then impacted in 20 degrees of  retroversion achieving excellent purchase and fixation.  We then performed a trial reduction which allowed appropriate soft tissue balance.  The trial metaphysis was removed the final metaphysis was impacted and the final +3 retentive poly was impacted as well.  This point we performed a final reduction which was extremely  difficult to achieve due to the need to manipulate the soft tissues and ultimately did gain an appropriate reduction which gave [place] excellent stability motion and soft tissue balance all much to our satisfaction.  The wound was copious irrigated once again.  Final hemostasis was obtained.  We did use a TXA sponge to help achieve hemostasis and then at the end of the case applied vancomycin powder liberally throughout all the deep soft tissue planes.  We did utilize both vancomycin and Ancef once her cultures had been obtained at the beginning of the case.  We achieved good hemostasis at the conclusion of the case and repaired the deltopectoral interval with a series of figure-of-eight and 1 Vicryl sutures.  2-0 Monocryl used to close the subcu layer and intracuticular 3-0 Monocryl for the skin followed by the use of a Prevena vacuum-assisted closure device.  Right arm was then placed in a sling and the patient was awakened, extubated, and taken to the recovery room in stable condition.  Marin Shutter MD    Contact # [phone]

## 2021-10-22 NOTE — Anesthesia Procedure Notes (Signed)
Procedure Name: Intubation Date/Time: 10/22/2021 2:21 PM  Performed by: Montel Clock, CRNAPre-anesthesia Checklist: Patient identified, Emergency Drugs available, Suction available, Patient being monitored and Timeout performed Patient Re-evaluated:Patient Re-evaluated prior to induction Oxygen Delivery Method: Circle system utilized Preoxygenation: Pre-oxygenation with 100% oxygen Induction Type: IV induction Ventilation: Mask ventilation without difficulty and Oral airway inserted - appropriate to patient size Laryngoscope Size: Mac, 3 and Glidescope Grade View: Grade I Tube type: Oral Tube size: 7.0 mm Number of attempts: 1 Airway Equipment and Method: Rigid stylet Placement Confirmation: ETT inserted through vocal cords under direct vision, positive ETCO2 and breath sounds checked- equal and bilateral Secured at: 21 cm Tube secured with: Tape Dental Injury: Teeth and Oropharynx as per pre-operative assessment  Comments: Elective glidescope r/t previous difficult airway. Limited ROM

## 2021-10-23 ENCOUNTER — Observation Stay: Payer: Self-pay

## 2021-10-23 ENCOUNTER — Other Ambulatory Visit (HOSPITAL_COMMUNITY): Payer: Self-pay

## 2021-10-23 DIAGNOSIS — Z96612 Presence of left artificial shoulder joint: Secondary | ICD-10-CM | POA: Diagnosis not present

## 2021-10-23 DIAGNOSIS — T84098A Other mechanical complication of other internal joint prosthesis, initial encounter: Secondary | ICD-10-CM | POA: Diagnosis not present

## 2021-10-23 DIAGNOSIS — Z87891 Personal history of nicotine dependence: Secondary | ICD-10-CM | POA: Diagnosis not present

## 2021-10-23 DIAGNOSIS — Z96653 Presence of artificial knee joint, bilateral: Secondary | ICD-10-CM | POA: Diagnosis not present

## 2021-10-23 DIAGNOSIS — Z96611 Presence of right artificial shoulder joint: Secondary | ICD-10-CM

## 2021-10-23 DIAGNOSIS — Z96651 Presence of right artificial knee joint: Secondary | ICD-10-CM | POA: Diagnosis not present

## 2021-10-23 MED ORDER — CEFTRIAXONE IV (FOR PTA / DISCHARGE USE ONLY): 2.0000 g | INTRAVENOUS | 0 refills | Status: DC

## 2021-10-23 MED ORDER — SODIUM CHLORIDE 0.9 % IV SOLN
2.0000 g | INTRAVENOUS | Status: DC
  Administered 2021-10-23: 2 g via INTRAVENOUS
  Filled 2021-10-23: qty 20

## 2021-10-23 MED ORDER — HEPARIN SOD (PORK) LOCK FLUSH 100 UNIT/ML IV SOLN
250.0000 [IU] | INTRAVENOUS | Status: AC | PRN
  Administered 2021-10-23: 250 [IU]
  Filled 2021-10-23: qty 2.5

## 2021-10-23 MED ORDER — SODIUM CHLORIDE 0.9 % IV SOLN: 2.0000 g | INTRAVENOUS | 0 refills | Status: DC

## 2021-10-23 MED ORDER — LOPERAMIDE HCL 2 MG PO CAPS
4.0000 mg | ORAL_CAPSULE | Freq: Once | ORAL | Status: AC
  Administered 2021-10-23: 4 mg via ORAL
  Filled 2021-10-23: qty 2

## 2021-10-23 NOTE — Progress Notes (Signed)
PICC capped by IV team. Switched to Prevena wound vac, no error noted on the Prevena, dressing clean/dry/intact.  Provided discharge education/instructions, all questions and concerns addressed. Pt not in acute distress, discharged home with belongings accompanied by her friend.

## 2021-10-23 NOTE — Consult Note (Signed)
[name] for Infectious Disease       Reason for Consult: possible shoulder infection    Referring Physician: Dr. Onnie Graham  Principal Problem:   History of revision of total shoulder arthroplasty    docusate sodium  100 mg Oral BID   pantoprazole  20 mg Oral Daily   predniSONE  5 mg Oral QHS    Recommendations: Ceftriaxone 2 grams once daily  Picc line OPAT   Diagnosis: PJI of right shoulder  Culture Result: GNR  Allergies  Allergen Reactions   Sulfa Antibiotics Itching and Rash    Head to toe   Teriparatide Other (See Comments)    Severe Muscle Pain,   *Forteo (Brand Name)   Valproic Acid And Related Rash    Head to toe   Flagyl [Metronidazole]     Fixed Drug Eruption on face and and right arm (Tablet Only)  Can use Cream   Flexeril [Cyclobenzaprine] Swelling    Left foot swelling   Plaquenil [Hydroxychloroquine] Diarrhea   Prilosec [Omeprazole] Diarrhea    Severe diarrhea    Serotonin Reuptake Inhibitors (Ssris) Diarrhea   Arava [Leflunomide]     Neuropathy in both feet   Celexa [Citalopram] Diarrhea        Depakote [Divalproex Sodium] Rash   Diflucan [Fluconazole] Rash    Fixed drug eruption   Paxil [Paroxetine Hcl] Diarrhea   Zoloft [Sertraline] Diarrhea    OPAT Orders Discharge antibiotics to be given via PICC line Discharge antibiotics: ceftriaxone 2 grams once daily IV Per pharmacy protocol yes Duration: 6 weeks End Date: 12/03/21  [facility] Care Per Protocol: yes  Home health RN for IV administration and teaching; PICC line care and labs.    Labs weekly while on IV antibiotics: __x CBC with differential __ BMP _x_ CMP _x_ CRP _x_ ESR __ Vancomycin trough __ CK  __x Please pull PIC at completion of IV antibiotics __ Please leave PIC in place until doctor has seen patient or been notified  Fax weekly labs to [phone]  Clinic Follow Up Appt: 11/6 with Dr. Linus Salmons  @ 3:30 pm  Assessment: She has had painful  loosening of the shoulder prosthesis now s/p right shoulder revision reverse arthroplasty with explantation and revision.  Gram stain of one sample with GNR so will use ceftriaxone as above.   Antibiotics: Starting ceftriaxone  HPI: Anne Garrett is a 46 y.o. female with a history of rheumatoid arthritis about 2 years out from a revision of her right shoulder reverse arthroplasty now with right shoulder pain and loosening of the prosthesis now s/p above procedure.  Multiple appropriate cultures sent and now gram stain positive.  No fever.  She did have some drainage in the shoulder in 2021 previously that responded to I and D and cephalexin.     Review of Systems:  Constitutional: negative for fevers and chills All other systems reviewed and are negative    Past Medical History:  Diagnosis Date   Adrenal insufficiency (HCC)    secondary to Prednisone   Eczema    coner oif eye lid   GERD (gastroesophageal reflux disease)    Headache    migraines   Intervertebral disk disease    Juvenile idiopathic arthritis ([place])    LGSIL (low grade squamous intraepithelial dysplasia) 08/20/2013   Formatting of this note might be different from the original. On pap smear June 2015.   HR HPV positive.  Colposcopy and cervical AND vaginal biopsies done Aug  2015   Osteoporosis    Rheumatoid arthritis ([place])    Juvenile onset   Snores    negative for OSA but uses dental mouth guard nightly    Social History   Tobacco Use   Smoking status: Former    Packs/day: 0.25    Years: 20.00    Total pack years: 5.00    Types: Cigarettes    Quit date: 07/02/2015    Years since quitting: 6.3    Passive exposure: Past   Smokeless tobacco: Never  Vaping Use   Vaping Use: Never used  Substance Use Topics   Alcohol use: Yes    Alcohol/week: 2.0 standard drinks of alcohol    Types: 2 Glasses of wine per week    Comment: social   Drug use: No    Family History  Problem Relation Age of Onset    Breast cancer Mother 76   Nephritis Father    Epilepsy Father    Breast cancer Maternal Aunt        2 mat aunts    Multiple sclerosis Maternal Uncle    Psoriasis Paternal Aunt    Diabetes Daughter     Allergies  Allergen Reactions   Sulfa Antibiotics Itching and Rash    Head to toe   Teriparatide Other (See Comments)    Severe Muscle Pain,   *Forteo (Brand Name)   Valproic Acid And Related Rash    Head to toe   Flagyl [Metronidazole]     Fixed Drug Eruption on face and and right arm (Tablet Only)  Can use Cream   Flexeril [Cyclobenzaprine] Swelling    Left foot swelling   Plaquenil [Hydroxychloroquine] Diarrhea   Prilosec [Omeprazole] Diarrhea    Severe diarrhea    Serotonin Reuptake Inhibitors (Ssris) Diarrhea   Arava [Leflunomide]     Neuropathy in both feet   Celexa [Citalopram] Diarrhea        Depakote [Divalproex Sodium] Rash   Diflucan [Fluconazole] Rash    Fixed drug eruption   Paxil [Paroxetine Hcl] Diarrhea   Zoloft [Sertraline] Diarrhea    Physical Exam: Constitutional: in no apparent distress  Vitals:   10/23/21 0507 10/23/21 0850  BP: 115/66 128/87  Pulse: 75 91  Resp: 19 17  Temp: 98.4 F (36.9 C) 99 F (37.2 C)  SpO2: 98% 99%   EYES: anicteric Respiratory: normal respiratory effort Musculoskeletal: no edema Skin: no rash  Lab Results  Component Value Date   WBC 5.8 10/22/2021   HGB 12.3 10/22/2021   HCT 37.5 10/22/2021   MCV 103.0 (H) 10/22/2021   PLT 199 10/22/2021    Lab Results  Component Value Date   CREATININE 0.59 10/22/2021   BUN 8 10/22/2021   NA 137 10/22/2021   K 4.1 10/22/2021   CL 108 10/22/2021   CO2 22 10/22/2021    Lab Results  Component Value Date   ALT 15 08/14/2020   AST 20 08/14/2020   ALKPHOS 63 08/14/2020     Microbiology: Recent Results (from the past 240 hour(s))  Surgical pcr screen     Status: None   Collection Time: 10/22/21 10:53 AM   Specimen: Nasal Mucosa; Nasal Swab  Result Value Ref  Range Status   MRSA, PCR NEGATIVE NEGATIVE Final   Staphylococcus aureus NEGATIVE NEGATIVE Final    Comment: (NOTE) The Xpert SA Assay (FDA approved for NASAL specimens in patients 79 years of age and older), is one component of a comprehensive surveillance program. It is  not intended to diagnose infection nor to guide or monitor treatment. Performed at [facility], [place] [address] [place], [place] [address]   Aerobic/Anaerobic Culture w Gram Stain (surgical/deep wound)     Status: None (Preliminary result)   Collection Time: 10/22/21  3:03 PM   Specimen: Wound  Result Value Ref Range Status   Specimen Description   Final    FLUID RIGHT SHOULD JOINT HOLD FOR 3 WEEKS Performed at [place] [address], [place], [place] [address]    Special Requests   Final    NONE Performed at [facility], [place] [address], [place], [place] [address]    Gram Stain NO WBC SEEN NO ORGANISMS SEEN   Final   Culture   Final    NO GROWTH < 12 HOURS Performed at [facility], [place] [address], [place], [place] [address]    Report Status PENDING  Incomplete  Aerobic/Anaerobic Culture w Gram Stain (surgical/deep wound)     Status: None (Preliminary result)   Collection Time: 10/22/21  3:04 PM   Specimen: Soft Tissue, Other  Result Value Ref Range Status   Specimen Description   Final    TISSUE RIGHT SHOULDER SYNOVIUM MEDIAL CALCAR HOLD FOR 3 WEEKS FOR C ACNES Performed at [place] [address], [place], [place] [address]    Special Requests   Final    NONE Performed at [facility], [place] [address] [place], [place] [address]    Gram Stain NO WBC SEEN NO ORGANISMS SEEN   Final   Culture   Final    NO GROWTH < 12 HOURS Performed at [facility], [place] [address] [place], [place] [address]    Report Status PENDING  Incomplete  Aerobic/Anaerobic Culture w Gram Stain (surgical/deep  wound)     Status: None (Preliminary result)   Collection Time: 10/22/21  3:06 PM   Specimen: Soft Tissue, Other  Result Value Ref Range Status   Specimen Description   Final    TISSUE RIGHT SHOULDER LATERAL METAPHYSIS HOLD FOR 3 WEEKS FOR C ACNES Performed at [facility], [place] [address], [place], [place] [address]    Special Requests   Final    NONE Performed at [facility], [place] [address], [place], [place] [address]    Gram Stain   Final    NO WBC SEEN RARE GRAM NEGATIVE RODS Performed at [facility], [place] [address] [place], [place] [address]    Culture PENDING  Incomplete   Report Status PENDING  Incomplete  Aerobic/Anaerobic Culture w Gram Stain (surgical/deep wound)     Status: None (Preliminary result)   Collection Time: 10/22/21  3:13 PM   Specimen: Soft Tissue, Other  Result Value Ref Range Status   Specimen Description   Final    TISSUE RIGHT SHOULDER CENTRAL METAPHYSIS HOLD FOR 3 WKS FOR C ACNES Performed at [facility], [place] [address], [place], [place] [address]    Special Requests   Final    NONE Performed at [facility], [place] [address], [place], [place] [address]    Gram Stain NO WBC SEEN NO ORGANISMS SEEN   Final   Culture   Final    NO GROWTH < 12 HOURS Performed at [facility], [place] [address], [place], [place] [address]    Report Status PENDING  Incomplete  Aerobic/Anaerobic Culture  w Gram Stain (surgical/deep wound)     Status: None (Preliminary result)   Collection Time: 10/22/21  3:20 PM   Specimen: Soft Tissue, Other  Result Value Ref Range Status   Specimen Description   Final    TISSUE RIGHT SHOULDER MEDULLARY HOLD FOR 3 WKS FOR C ACNES Performed at [place] [address] [place], [place] [address]    Special Requests   Final    NONE Performed at [facility], [place] [address], [place], [place] [address]     Gram Stain NO WBC SEEN NO ORGANISMS SEEN   Final   Culture   Final    NO GROWTH < 12 HOURS Performed at [facility], [place] [address], [place], [place] [address]    Report Status PENDING  Incomplete  Aerobic/Anaerobic Culture w Gram Stain (surgical/deep wound)     Status: None (Preliminary result)   Collection Time: 10/22/21  3:31 PM   Specimen: Soft Tissue, Other  Result Value Ref Range Status   Specimen Description   Final    TISSUE RIGHT SHOULDER GLENOID BASELINE HOLD FOR 3 WKS FOR C ACNES Performed at [place] [address] [place], [place] [address]    Special Requests   Final    NONE Performed at [facility], [place] [address] [place], [place] [address]    Gram Stain   Final    NO WBC SEEN NO ORGANISMS SEEN Performed at [facility], [place] [address], [place], [place] [address]    Culture PENDING  Incomplete   Report Status PENDING  Incomplete    Thayer Headings, [place] for Infectious Disease [email or web address] 10/23/2021, 9:57 AM

## 2021-10-23 NOTE — Plan of Care (Signed)

## 2021-10-23 NOTE — Progress Notes (Signed)
  Transition of Care [facility]) Screening Note   Patient Details  Name: Anne Garrett Date of Birth: Jan 23, 1975   Transition of Care [facility]) CM/SW Contact:    Lennart Pall, LCSW Phone Number: 10/23/2021, 9:30 AM    Transition of Care Department [facility]) has reviewed patient and no TOC needs have been identified at this time. We will continue to monitor patient advancement through interdisciplinary progression rounds. If new patient transition needs arise, please place a TOC consult.

## 2021-10-23 NOTE — Progress Notes (Signed)
Peripherally Inserted Central Catheter Placement  The IV Nurse has discussed with the patient and/or persons authorized to consent for the patient, the purpose of this procedure and the potential benefits and risks involved with this procedure.  The benefits include less needle sticks, lab draws from the catheter, and the patient may be discharged home with the catheter. Risks include, but not limited to, infection, bleeding, blood clot (thrombus formation), and puncture of an artery; nerve damage and irregular heartbeat and possibility to perform a PICC exchange if needed/ordered by physician.  Alternatives to this procedure were also discussed.  Bard Power PICC patient education guide, fact sheet on infection prevention and patient information card has been provided to patient /or left at bedside.    PICC Placement Documentation  PICC Single Lumen 25/85/27 Left Basilic 39 cm 0 cm (Active)  Indication for Insertion or Continuance of Line Home intravenous therapies (PICC only) 10/23/21 1516  Exposed Catheter (cm) 0 cm 10/23/21 1516  Site Assessment Clean, Dry, Intact 10/23/21 1516  Line Status Flushed;Saline locked;Blood return noted 10/23/21 1516  Dressing Type Transparent;Securing device 10/23/21 1516  Dressing Status Antimicrobial disc in place;Clean, Dry, Intact 10/23/21 1516  Safety Lock Not Applicable 78/24/23 5361  Line Care Connections checked and tightened 10/23/21 1516  Line Adjustment (NICU/IV Team Only) No 10/23/21 1516  Dressing Intervention New dressing 10/23/21 1516  Dressing Change Due 10/30/21 10/23/21 [place] 10/23/2021, 3:17 PM

## 2021-10-23 NOTE — Discharge Summary (Signed)
PATIENT ID:      Anne Garrett  MRN:     [id number] DOB/AGE:    06-30-1975 / 46 y.o.     DISCHARGE SUMMARY  ADMISSION DATE:    10/22/2021 DISCHARGE DATE:   10/23/2021  ADMISSION DIAGNOSIS: loose right shoulder reverse arthroplasty Past Medical History:  Diagnosis Date   Adrenal insufficiency ([place])    secondary to Prednisone   Eczema    coner oif eye lid   GERD (gastroesophageal reflux disease)    Headache    migraines   Intervertebral disk disease    Juvenile idiopathic arthritis ([place])    LGSIL (low grade squamous intraepithelial dysplasia) 08/20/2013   Formatting of this note might be different from the original. On pap smear June 2015.   HR HPV positive.  Colposcopy and cervical AND vaginal biopsies done Aug 2015   Osteoporosis    Rheumatoid arthritis ([place])    Juvenile onset   Snores    negative for OSA but uses dental mouth guard nightly    DISCHARGE DIAGNOSIS:   Principal Problem:   History of revision of total shoulder arthroplasty   PROCEDURE: Procedure(s): Revision Right shoulder reverse arthroplasty on 10/22/2021  CONSULTS:    HISTORY:  See H&P in chart.  HOSPITAL COURSE:  Anne Garrett is a 46 y.o. admitted on 10/22/2021 with a diagnosis of loose right shoulder reverse arthroplasty.  They were brought to the operating room on 10/22/2021 and underwent Procedure(s): Revision Right shoulder reverse arthroplasty.    They were given perioperative antibiotics:  Anti-infectives (From admission, onward)    Start     Dose/Rate Route Frequency Ordered Stop   10/22/21 2200  doxycycline (VIBRA-TABS) tablet 100 mg        100 mg Oral Every 12 hours 10/22/21 1859     10/22/21 1530  vancomycin (VANCOCIN) powder  Status:  Discontinued          As needed 10/22/21 1530 10/22/21 1846   10/22/21 1327  vancomycin (VANCOCIN) 1-5 GM/200ML-% IVPB       Note to Pharmacy: Virgia Land J: cabinet override      10/22/21 1327 10/23/21 0144   10/22/21 1300  ceFAZolin (ANCEF)  IVPB 2g/100 mL premix        2 g 200 mL/hr over 30 Minutes Intravenous  Once 10/22/21 1257 10/22/21 1550   10/22/21 0000  doxycycline (VIBRAMYCIN) 100 MG capsule        100 mg Oral 2 times daily 10/22/21 1814       .  Patient underwent the above named procedure and tolerated it well. The following day they were hemodynamically stable and pain was controlled on oral analgesics. They were neurovascularly intact to the operative extremity.  The block was still working but beginning to wear off.  Intraoperatively it appeared that the implant just had mechanically loosened as opposed to having an active infectious process however we did ask infectious disease to come see her.  She was placed on oral doxycycline until the cultures were complete and we asked him to hold for 21 days.  After infectious disease sees patient she will be stable for discharge.    DIAGNOSTIC STUDIES:  RECENT RADIOGRAPHIC STUDIES :  DG Shoulder Right Port  Result Date: 10/22/2021 CLINICAL DATA:  Status post right shoulder replacement. EXAM: RIGHT SHOULDER - 1 VIEW COMPARISON:  None Available. FINDINGS: Reverse right shoulder arthroplasty in expected alignment. The humeral shaft aspect of the humeral stem is not entirely included in the field  of view, reviewed by the referring clinician. No periprosthetic lucency recent postsurgical change includes air and edema in the joint space. Overlying wound VAC in place. IMPRESSION: Reverse right shoulder arthroplasty in expected alignment. Electronically Signed   By: Keith Rake M.D.   On: 10/22/2021 18:15   NM Bone Scan 3 Phase  Result Date: 10/01/2021 CLINICAL DATA:  RIGHT shoulder pain, prior BILATERAL shoulder replacement surgery in 2021, question prosthetic loosening versus infection EXAM: NUCLEAR MEDICINE 3-PHASE BONE SCAN TECHNIQUE: Radionuclide angiographic images, immediate static blood pool images, and 3-hour delayed static images were obtained of the shoulders after  intravenous injection of radiopharmaceutical. RADIOPHARMACEUTICALS:  21.7 mCi Tc-67mMDP IV COMPARISON:  None Radiographic correlation: None FINDINGS: Vascular phase: Normal blood flow to both shoulder regions Blood pool phase: Normal blood pool at both shoulder regions Delayed phase: Photopenic defects at both shoulders from prosthetic components. Uptake of tracer is seen adjacent to the distal and proximal aspects of the humeral component of the RIGHT shoulder prosthesis, a pattern which can be seen with aseptic loosening or infection. Minimal focal uptake of tracer is seen adjacent to the proximal portion of the humeral component of the LEFT shoulder prosthesis, a less specific pattern, if patient is symptomatic loosening not entirely excluded. IMPRESSION: Uptake adjacent to the proximal and distal portions of the humeral component of the RIGHT shoulder prosthesis suspicious for aseptic loosening or infection. Mild uptake of tracer adjacent to the proximal portion of the humeral component of the LEFT shoulder prosthesis, nonspecific in the setting of an asymptomatic shoulder, if symptomatic, loosening not completely excluded. Electronically Signed   By: MLavonia DanaM.D.   On: 10/01/2021 16:46    RECENT VITAL SIGNS:  Patient Vitals for the past 24 hrs:  BP Temp Temp src Pulse Resp SpO2 Height Weight  10/23/21 0507 115/66 98.4 F (36.9 C) Oral 75 19 98 % -- --  10/23/21 0122 (!) 110/59 98 F (36.7 C) Oral 64 18 97 % -- --  10/22/21 2107 105/68 98.2 F (36.8 C) Oral 73 18 100 % -- --  10/22/21 1857 108/67 97.9 F (36.6 C) Oral 63 16 97 % -- --  10/22/21 1830 103/65 -- -- 67 12 96 % -- --  10/22/21 1815 110/73 -- -- 72 16 95 % -- --  10/22/21 1800 118/77 -- -- 75 13 94 % -- --  10/22/21 1745 124/69 98.1 F (36.7 C) -- 88 15 93 % -- --  10/22/21 1315 109/64 -- -- (!) 56 16 99 % -- --  10/22/21 1310 -- -- -- (!) 52 13 98 % -- --  10/22/21 1305 -- -- -- 70 14 98 % -- --  10/22/21 1249 -- -- -- (!) 53  12 99 % -- --  10/22/21 1244 126/82 -- -- (!) 57 15 100 % -- --  10/22/21 1151 -- -- -- -- -- -- 4' 11.5" (1.511 m) 56 kg  10/22/21 1104 120/85 97.6 F (36.4 C) Oral 80 18 98 % -- --  .  RECENT EKG RESULTS:    Orders placed or performed during the hospital encounter of 03/05/19   EKG 12-Lead   EKG 12-Lead   ED EKG   ED EKG    DISCHARGE INSTRUCTIONS:    DISCHARGE MEDICATIONS:   Allergies as of 10/23/2021       Reactions   Sulfa Antibiotics Itching, Rash   Head to toe   Teriparatide Other (See Comments)   Severe Muscle Pain,  *Forteo (Brand  Name)   Valproic Acid And Related Rash   Head to toe   Flagyl [metronidazole]    Fixed Drug Eruption on face and and right arm (Tablet Only) Can use Cream   Flexeril [cyclobenzaprine] Swelling   Left foot swelling   Plaquenil [hydroxychloroquine] Diarrhea   Prilosec [omeprazole] Diarrhea   Severe diarrhea    Serotonin Reuptake Inhibitors (ssris) Diarrhea   Arava [leflunomide]    Neuropathy in both feet   Celexa [citalopram] Diarrhea      Depakote [divalproex Sodium] Rash   Diflucan [fluconazole] Rash   Fixed drug eruption   Paxil [paroxetine Hcl] Diarrhea   Zoloft [sertraline] Diarrhea        Medication List     TAKE these medications    Actemra ACTPen 162 MG/0.9ML Soaj Generic drug: Tocilizumab AS DIRECTED SUBCUTANEOUS WEEKLY. What changed:  how much to take how to take this when to take this   amoxicillin 500 MG capsule Commonly known as: AMOXIL Take 4 capsules (2,000 mg total) by mouth 1 hour before dental appt for pre-med   BIOTIN PO Take 1 tablet by mouth daily.   butalbital-acetaminophen-caffeine 50-325-40 MG tablet Commonly known as: FIORICET Take 1 tablet by mouth every 6  hours as needed for headache.   CALCIUM+D3 PO Take 1 tablet by mouth daily.   doxycycline 100 MG capsule Commonly known as: VIBRAMYCIN Take 1 capsule (100 mg total) by mouth 2 (two) times daily.   HYDROcodone-acetaminophen  5-325 MG tablet Commonly known as: NORCO/VICODIN Take 1 tablet by mouth  twice daily as needed for pain What changed: reasons to take this   methocarbamol 500 MG tablet Commonly known as: ROBAXIN Take 1 tablet (500 mg total) by mouth 4 (four) times daily. What changed:  when to take this reasons to take this   multivitamin capsule Take 1 capsule by mouth daily.   naproxen sodium 220 MG tablet Commonly known as: ALEVE Take 220 mg by mouth daily as needed (aches and pains).   ondansetron 4 MG tablet Commonly known as: Zofran Take 1 tablet (4 mg total) by mouth every 8 (eight) hours as needed for nausea or vomiting.   oxyCODONE-acetaminophen 5-325 MG tablet Commonly known as: Percocet Take 1 tablet by mouth every 4 (four) hours as needed (max 6 daily).   pantoprazole 20 MG tablet Commonly known as: Protonix Take 1 tablet (20 mg total) by mouth daily.   predniSONE 5 MG tablet Commonly known as: DELTASONE Take 1-2 tablets (5-10 mg total) by mouth daily. What changed:  how much to take when to take this   Commack 1 application topically See admin instructions. Apply dime-size amount to face 1-2 TIMES per day as needed rosacea   Metronidazole 1% + Ivermectin 1% + Azelaic Acid 15%   triamcinolone cream 0.1 % Commonly known as: KENALOG Apply 1 application topically 2 (two) times daily as needed. What changed: reasons to take this   Vitamin D3 50 MCG (2000 UT) Tabs Take 2,000 Units by mouth daily.        FOLLOW UP VISIT:    Follow-up Information     [name], MD Follow up.   Specialty: Orthopedic Surgery Why: we will call you with time for next Friday Contact information: [address] [place] [address] [phone]                 DISCHARGE TO: Home   DISCHARGE CONDITION:  Thereasa Parkin Britney Captain for  Dr. [name] 10/23/2021, 8:49 AM

## 2021-10-23 NOTE — TOC Transition Note (Signed)
Transition of Care [facility]) - CM/SW Discharge Note   Patient Details  Name: Anne Garrett MRN: [id number] Date of Birth: 01-Feb-1975  [facility] CM/SW Contact:  Lennart Pall, LCSW Phone Number: 10/23/2021, 10:32 AM   Clinical Narrative:    Alerted by RN that MD now planning to dc pt home with IV abx coverage.  Met with pt who is aware of plan and agreeable.  She does not have any HH/ IV abx agency preferences. Have placed referral with [facility] Carolynn Sayers, RN) and with [facility] coverage).  Carolynn Sayers anticipates being able to complete teaching after lunch today.  Still await PICC placement.  No further TOC needs at this point.   Final next level of care: [place] Barriers to Discharge: No Barriers Identified   Patient Goals and CMS Choice Patient states their goals for this hospitalization and ongoing recovery are:: return home      Discharge Placement                       Discharge Plan and Services                          HH Arranged: RN, IV Antibiotics HH Agency: [facility] Date [place]: 10/23/21 Time [address]: 1032 Representative spoke with at [place]: Carolynn Sayers, RN  Social Determinants of Health (SDOH) Interventions     Readmission Risk Interventions    10/23/2021    9:31 AM  Readmission Risk Prevention Plan  Post Dischage Appt Complete  Medication Screening Complete  Transportation Screening Complete

## 2021-10-23 NOTE — Progress Notes (Signed)
PHARMACY CONSULT NOTE FOR:  OUTPATIENT  PARENTERAL ANTIBIOTIC THERAPY (OPAT)  Indication: PJI Shoulder  Regimen: Ceftriaxone 2g IV q24h  End date: 12/03/21  IV antibiotic discharge orders are pended. To discharging provider:  please sign these orders via discharge navigator,  Select New Orders & click on the button choice - Manage This Unsigned Work.     Thank you for allowing pharmacy to be a part of this patient's care.  Francena Hanly, PharmD Pharmacy Resident  10/23/2021 11:13 AM

## 2021-10-24 DIAGNOSIS — M1711 Unilateral primary osteoarthritis, right knee: Secondary | ICD-10-CM | POA: Diagnosis not present

## 2021-10-25 DIAGNOSIS — M1711 Unilateral primary osteoarthritis, right knee: Secondary | ICD-10-CM | POA: Diagnosis not present

## 2021-10-26 DIAGNOSIS — M1711 Unilateral primary osteoarthritis, right knee: Secondary | ICD-10-CM | POA: Diagnosis not present

## 2021-10-27 ENCOUNTER — Encounter (HOSPITAL_COMMUNITY): Payer: Self-pay | Admitting: Orthopedic Surgery

## 2021-10-27 DIAGNOSIS — Z96611 Presence of right artificial shoulder joint: Secondary | ICD-10-CM | POA: Diagnosis not present

## 2021-10-27 DIAGNOSIS — M1711 Unilateral primary osteoarthritis, right knee: Secondary | ICD-10-CM | POA: Diagnosis not present

## 2021-10-27 LAB — AEROBIC/ANAEROBIC CULTURE W GRAM STAIN (SURGICAL/DEEP WOUND)
Culture: NO GROWTH
Culture: NO GROWTH
Culture: NO GROWTH
Culture: NO GROWTH
Culture: NO GROWTH
Culture: NO GROWTH
Gram Stain: NONE SEEN
Gram Stain: NONE SEEN
Gram Stain: NONE SEEN
Gram Stain: NONE SEEN
Gram Stain: NONE SEEN
Gram Stain: NONE SEEN

## 2021-10-27 LAB — SURGICAL PATHOLOGY

## 2021-10-28 ENCOUNTER — Encounter: Payer: Self-pay | Admitting: Internal Medicine

## 2021-10-28 DIAGNOSIS — M1711 Unilateral primary osteoarthritis, right knee: Secondary | ICD-10-CM | POA: Diagnosis not present

## 2021-10-29 ENCOUNTER — Other Ambulatory Visit: Payer: Self-pay | Admitting: Internal Medicine

## 2021-10-29 ENCOUNTER — Telehealth: Payer: Self-pay

## 2021-10-29 ENCOUNTER — Other Ambulatory Visit (HOSPITAL_COMMUNITY): Payer: Self-pay

## 2021-10-29 DIAGNOSIS — M1711 Unilateral primary osteoarthritis, right knee: Secondary | ICD-10-CM | POA: Diagnosis not present

## 2021-10-29 MED ORDER — CEFADROXIL 500 MG PO CAPS
1000.0000 mg | ORAL_CAPSULE | Freq: Two times a day (BID) | ORAL | 0 refills | Status: DC
  Filled 2021-10-29: qty 120, 30d supply, fill #0

## 2021-10-29 NOTE — Telephone Encounter (Signed)
Message sent to Carolynn Sayers, RN and [facility] staff, Per Dr.Comer pull PICC line today. Patient is transitioning to oral abx. [facility] pharmacy aware as well.    [place], CMA

## 2021-10-29 NOTE — Progress Notes (Signed)
Patient is not comfortable continuing with IV therapy.  She will be changed to oral cefadroxil.   Thayer Headings, MD

## 2021-10-30 DIAGNOSIS — Z96611 Presence of right artificial shoulder joint: Secondary | ICD-10-CM | POA: Diagnosis not present

## 2021-10-30 DIAGNOSIS — M1711 Unilateral primary osteoarthritis, right knee: Secondary | ICD-10-CM | POA: Diagnosis not present

## 2021-11-01 NOTE — Anesthesia Postprocedure Evaluation (Signed)
Anesthesia Post Note  Patient: Lunabelle Oatley  Procedure(s) Performed: Revision Right shoulder reverse arthroplasty (Right: Shoulder)     Patient location during evaluation: PACU Anesthesia Type: Regional and General Level of consciousness: awake and alert Pain management: pain level controlled Vital Signs Assessment: post-procedure vital signs reviewed and stable Respiratory status: spontaneous breathing, nonlabored ventilation, respiratory function stable and patient connected to nasal cannula oxygen Cardiovascular status: blood pressure returned to baseline and stable Postop Assessment: no apparent nausea or vomiting Anesthetic complications: no   No notable events documented.  Last Vitals:  Vitals:   10/23/21 0850 10/23/21 1301  BP: 128/87 116/70  Pulse: 91 84  Resp: 17 14  Temp: 37.2 C 36.9 C  SpO2: 99% 98%    Last Pain:  Vitals:   10/23/21 1650  TempSrc:   PainSc: 4    Pain Goal:                   Pheobe Sandiford

## 2021-11-02 ENCOUNTER — Other Ambulatory Visit (HOSPITAL_COMMUNITY): Payer: Self-pay

## 2021-11-02 MED ORDER — METHOCARBAMOL 500 MG PO TABS
500.0000 mg | ORAL_TABLET | Freq: Three times a day (TID) | ORAL | 0 refills | Status: DC | PRN
  Filled 2021-11-02: qty 30, 10d supply, fill #0

## 2021-11-06 ENCOUNTER — Encounter: Payer: BC Managed Care – PPO | Admitting: Gastroenterology

## 2021-11-06 ENCOUNTER — Other Ambulatory Visit (HOSPITAL_COMMUNITY): Payer: Self-pay

## 2021-11-06 MED ORDER — OXYCODONE-ACETAMINOPHEN 5-325 MG PO TABS
1.0000 | ORAL_TABLET | Freq: Two times a day (BID) | ORAL | 0 refills | Status: DC
  Filled 2021-11-06: qty 30, 15d supply, fill #0

## 2021-11-09 ENCOUNTER — Other Ambulatory Visit (HOSPITAL_COMMUNITY): Payer: Self-pay

## 2021-11-09 ENCOUNTER — Encounter: Payer: Self-pay | Admitting: Internal Medicine

## 2021-11-09 ENCOUNTER — Ambulatory Visit (INDEPENDENT_AMBULATORY_CARE_PROVIDER_SITE_OTHER): Payer: BC Managed Care – PPO | Admitting: Internal Medicine

## 2021-11-09 ENCOUNTER — Other Ambulatory Visit: Payer: Self-pay

## 2021-11-09 VITALS — BP 109/73 | HR 61 | Temp 97.9°F | Ht 59.5 in | Wt 124.0 lb

## 2021-11-09 DIAGNOSIS — Z96619 Presence of unspecified artificial shoulder joint: Secondary | ICD-10-CM

## 2021-11-09 DIAGNOSIS — T8459XD Infection and inflammatory reaction due to other internal joint prosthesis, subsequent encounter: Secondary | ICD-10-CM

## 2021-11-09 MED ORDER — CEFADROXIL 500 MG PO CAPS
1000.0000 mg | ORAL_CAPSULE | Freq: Two times a day (BID) | ORAL | 0 refills | Status: DC
  Filled 2021-11-09: qty 40, 10d supply, fill #0

## 2021-11-09 NOTE — Progress Notes (Signed)
   Subjective:    Patient ID: Anne Garrett, female    DOB: Dec 09, 1975, 46 y.o.   MRN: [id number]  HPI Kathelene is here for follow up of possible prosthetic joint infection of her shouder. She has about 1 year of pain in her shoulder and Dr. Onnie Graham took her to the OR 10/19 for right shoulder revision reverse arhtoplasty with explantation of original prosthetic elements as well as I and D and did not appear overtly infected.  Gram stain did find a GNR but no growth from the cultures.  Unfortunately, the cultures were not held as requested for 3 weeks.     Review of Systems  Constitutional:  Negative for fever.  Gastrointestinal:  Negative for diarrhea.  Skin:  Negative for rash.       Objective:   Physical Exam Eyes:     General: No scleral icterus. Pulmonary:     Effort: Pulmonary effort is normal.  Neurological:     Mental Status: She is alert.           Assessment & Plan:

## 2021-11-09 NOTE — Assessment & Plan Note (Addendum)
Possible infection based on a positive gram stain and pain.  I discussed the options with her at this point including stopping vs completing 6 weeks.  I have recommended to complete the 6 weeks and continue coverage for GNR and possible C acnes an she will continue.  An extra refill sent for 10 days after completion of her current refill.   She can otherwise follow up here as needed or if new concerns develop Will check inflammatory markers as well  I have personally spent 22 minutes involved in face-to-face and non-face-to-face activities for this patient on the day of the visit. Professional time spent includes the following activities: Preparing to see the patient (review of tests), Obtaining and/or reviewing separately obtained history (admission/discharge record), Performing a medically appropriate examination and/or evaluation , Ordering medications/tests/procedures, referring and communicating with other health care professionals, Documenting clinical information in the EMR, Independently interpreting results (not separately reported), Communicating results to the patient/family/caregiver, Counseling and educating the patient/family/caregiver and Care coordination (not separately reported).

## 2021-11-10 LAB — C-REACTIVE PROTEIN: CRP: 0.8 mg/L (ref <8.0)

## 2021-11-10 LAB — SEDIMENTATION RATE: Sed Rate: 2 mm/h (ref 0–20)

## 2021-11-15 ENCOUNTER — Other Ambulatory Visit: Payer: Self-pay | Admitting: Family Medicine

## 2021-11-16 ENCOUNTER — Other Ambulatory Visit (HOSPITAL_COMMUNITY): Payer: Self-pay

## 2021-11-16 ENCOUNTER — Encounter: Payer: Self-pay | Admitting: Gastroenterology

## 2021-11-16 MED ORDER — PANTOPRAZOLE SODIUM 20 MG PO TBEC
20.0000 mg | DELAYED_RELEASE_TABLET | Freq: Every day | ORAL | 0 refills | Status: DC
  Filled 2021-11-16: qty 90, 90d supply, fill #0

## 2021-11-21 LAB — FUNGUS CULTURE WITH STAIN

## 2021-11-21 LAB — FUNGAL ORGANISM REFLEX

## 2021-11-21 LAB — FUNGUS CULTURE RESULT

## 2021-11-23 LAB — FUNGUS CULTURE WITH STAIN

## 2021-11-23 LAB — FUNGUS CULTURE RESULT

## 2021-11-23 LAB — FUNGAL ORGANISM REFLEX

## 2021-11-24 ENCOUNTER — Other Ambulatory Visit (HOSPITAL_COMMUNITY): Payer: Self-pay

## 2021-11-24 MED ORDER — COLESTIPOL HCL 1 G PO TABS
2.0000 g | ORAL_TABLET | Freq: Two times a day (BID) | ORAL | 1 refills | Status: DC
  Filled 2021-11-24: qty 120, 30d supply, fill #0

## 2021-12-01 ENCOUNTER — Inpatient Hospital Stay (HOSPITAL_COMMUNITY): Admission: RE | Admit: 2021-12-01 | Payer: BC Managed Care – PPO | Source: Ambulatory Visit

## 2021-12-08 ENCOUNTER — Other Ambulatory Visit (HOSPITAL_COMMUNITY): Payer: Self-pay

## 2021-12-15 DIAGNOSIS — Z79899 Other long term (current) drug therapy: Secondary | ICD-10-CM | POA: Diagnosis not present

## 2021-12-15 DIAGNOSIS — M2550 Pain in unspecified joint: Secondary | ICD-10-CM | POA: Diagnosis not present

## 2021-12-15 DIAGNOSIS — M0579 Rheumatoid arthritis with rheumatoid factor of multiple sites without organ or systems involvement: Secondary | ICD-10-CM | POA: Diagnosis not present

## 2021-12-15 DIAGNOSIS — Z13220 Encounter for screening for lipoid disorders: Secondary | ICD-10-CM | POA: Diagnosis not present

## 2021-12-15 DIAGNOSIS — M810 Age-related osteoporosis without current pathological fracture: Secondary | ICD-10-CM | POA: Diagnosis not present

## 2021-12-15 LAB — BASIC METABOLIC PANEL
CO2: 19 (ref 13–22)
Chloride: 107 (ref 99–108)
Creatinine: 0.6 (ref 0.5–1.1)
Glucose: 106
Potassium: 4.3 mEq/L (ref 3.5–5.1)
Sodium: 141 (ref 137–147)

## 2021-12-15 LAB — CBC: RBC: 3.73 — AB (ref 3.87–5.11)

## 2021-12-16 LAB — LIPID PANEL
Cholesterol: 272 — AB (ref 0–200)
HDL: 90 — AB (ref 35–70)
LDL Cholesterol: 166
Triglycerides: 98 (ref 40–160)

## 2021-12-16 LAB — CBC AND DIFFERENTIAL
HCT: 36 (ref 36–46)
Hemoglobin: 12.5 (ref 12.0–16.0)
Neutrophils Absolute: 1.9
Platelets: 276 10*3/uL (ref 150–400)
WBC: 4.1

## 2021-12-16 LAB — HEPATIC FUNCTION PANEL
ALT: 37 U/L — AB (ref 7–35)
AST: 29 (ref 13–35)
Alkaline Phosphatase: 80 (ref 25–125)
Bilirubin, Total: 0.2

## 2021-12-16 LAB — COMPREHENSIVE METABOLIC PANEL
Albumin: 4.5 (ref 3.5–5.0)
Calcium: 9.2 (ref 8.7–10.7)
Globulin: 2
eGFR: 111

## 2021-12-17 ENCOUNTER — Encounter: Payer: Self-pay | Admitting: *Deleted

## 2021-12-18 ENCOUNTER — Other Ambulatory Visit (HOSPITAL_COMMUNITY): Payer: Self-pay

## 2021-12-18 ENCOUNTER — Encounter: Payer: Self-pay | Admitting: Physician Assistant

## 2021-12-18 ENCOUNTER — Ambulatory Visit (INDEPENDENT_AMBULATORY_CARE_PROVIDER_SITE_OTHER): Payer: BC Managed Care – PPO | Admitting: Physician Assistant

## 2021-12-18 VITALS — BP 92/70 | HR 74 | Ht 59.5 in | Wt 125.4 lb

## 2021-12-18 DIAGNOSIS — R09A2 Foreign body sensation, throat: Secondary | ICD-10-CM | POA: Diagnosis not present

## 2021-12-18 DIAGNOSIS — R197 Diarrhea, unspecified: Secondary | ICD-10-CM

## 2021-12-18 MED ORDER — CHOLESTYRAMINE 4 G PO PACK
4.0000 g | PACK | Freq: Two times a day (BID) | ORAL | 1 refills | Status: AC
  Filled 2021-12-18: qty 52, 26d supply, fill #0
  Filled 2021-12-18: qty 8, 4d supply, fill #0
  Filled 2022-02-06: qty 60, 30d supply, fill #1

## 2021-12-18 NOTE — Progress Notes (Signed)
Chief Complaint: Follow-up diarrhea  HPI:    Anne Garrett is a 46 year old female with a past medical history as listed below including rheumatoid arthritis, who returns to clinic today for follow-up of diarrhea.      Patient has been accepted by Dr. Rush Landmark. Apparently she had a colonoscopy in 2006 in [place] and was diagnosed with IBS. Also evaluated [facility] in 2021 by telehealth visit and at that time colonoscopy was recommended but she declined due to [place]. Since then is started have issues with abdominal pain.     09/29/2021 patient seen in clinic for diarrhea.  At that time schedule patient for an EGD and colonoscopy and stool studies.  Continue Pantoprazole 20 daily.    11/23/2021 patient contacted [place].  At that time discussed that she was put on prophylactic antibiotics in relation to orthopedic surgery on 10/19 and noticed that while on antibiotics her bowel issues improved.  She was wanting to start treatment before being seen.  We she was started on Colestipol 2 g p.o. twice a day.    Today, the patient presents to clinic and explains that when she was taking the Colestipol it worked really well to help with her diarrhea in fact after the first dose it decreased frequency and the next day it started to firm up her stools, she took this for a while but then developed an allergic reaction including a rash across her face and swollen eyes.  She tried stopping this medication for about a week and the rash gradually went away so she tried it again, as she had also eaten some food she normally does not eat as well but when she restarted the medicine every thing came back.  She is wondering what else she can do since this worked so well for her.      Apologizes that she has not scheduled procedures yet, is having trouble with her ride.    Denies fever, chills, weight loss, blood in her stool, nausea, vomiting or abdominal pain.  Past Medical History:  Diagnosis Date   Adrenal insufficiency  ([place])    secondary to Prednisone   Eczema    coner oif eye lid   GERD (gastroesophageal reflux disease)    Headache    migraines   Intervertebral disk disease    Juvenile idiopathic arthritis ([place])    LGSIL (low grade squamous intraepithelial dysplasia) 08/20/2013   Formatting of this note might be different from the original. On pap smear June 2015.   HR HPV positive.  Colposcopy and cervical AND vaginal biopsies done Aug 2015   Osteoporosis    Rheumatoid arthritis ([place])    Juvenile onset   Snores    negative for OSA but uses dental mouth guard nightly    Past Surgical History:  Procedure Laterality Date   ANKLE ARTHROPLASTY Right    ANTERIOR CERVICAL DECOMP/DISCECTOMY FUSION N/A 06/19/2015   Procedure: C3-4 C4-5 Anterior cervical decompression/diskectomy/fusion;  Surgeon: Jovita Gamma, MD;  Location: [facility];  Service: Neurosurgery;  Laterality: N/A;  C3-4 C4-5 Anterior cervical decompression/diskectomy/fusion   ANTERIOR CERVICAL DECOMP/DISCECTOMY FUSION  07/27/2021   APPENDECTOMY     CARPAL TUNNEL RELEASE Right    CESAREAN SECTION     CHOLECYSTECTOMY     CHOLECYSTECTOMY  2006   COLONOSCOPY     ESSURE TUBAL LIGATION     Essure Implant-Permanent birth control   excision to palm Left    FOOT ARTHRODESIS Left 03/01/2019   Procedure: Left talonavicular and  naviculocuneiform arthrodesis;  Surgeon: [name], MD;  Location: [facility];  Service: Orthopedics;  Laterality: Left;   HARDWARE REMOVAL Right 02/02/2017   Procedure: RIGHT WRIST DEEP IMPLANT REMOVAL, RIGHT WRIST AND HAND EXTENSOR TENDON RECONSTRUCTION, TENDON TRANSFER AND RECONSTRUCTION AS INDICATED;  Surgeon: Iran Planas, MD;  Location: [facility];  Service: Orthopedics;  Laterality: Right;   IRRIGATION AND DEBRIDEMENT SHOULDER Right 11/13/2019   Procedure: IRRIGATION AND DEBRIDEMENT RIGHT SHOULDER WITH WOUND CLOSURE;  Surgeon: [name], MD;  Location: [place];  Service: Orthopedics;  Laterality:  Right;  56mn   JOINT REPLACEMENT Bilateral    bilat hip replacements   KNEE ARTHROPLASTY Right 08/25/2020   Procedure: COMPUTER ASSISTED TOTAL KNEE ARTHROPLASTY;  Surgeon: AGaynelle Arabian MD;  Location: [facility];  Service: Orthopedics;  Laterality: Right;   ORIF FEMUR FRACTURE Right    OTHER SURGICAL HISTORY  06/04/2016   Revision of PLIF   plif l5-s1 arthrodeisis    2018   POSTERIOR CERVICAL FUSION/FORAMINOTOMY N/A 07/12/2015   Procedure: C2 to C5 Cervical laminectomy, C2 to C5 posterior cervical arthrodesis with instrumentation and bone graft;  Surgeon: RJovita Gamma MD;  [facility];  Service: Neurosurgery;  Laterality: N/A;  C2 to C5 Cervical laminectomy, C2  to C5 posterior cervical arthrodesis with instrumentation and bone graft   REPAIR EXTENSOR TENDON Right 02/02/2017   Procedure: REPAIR EXTENSOR TENDON;  Surgeon: OIran Planas MD;  [place]  Service: Orthopedics;  Laterality: Right;   REVERSE SHOULDER ARTHROPLASTY Left 09/07/2018   Procedure: REVERSE SHOULDER ARTHROPLASTY;  Surgeon: SJustice Britain MD;  [facility];  Service: Orthopedics;  Laterality: Left;   REVERSE SHOULDER ARTHROPLASTY Right 09/20/2019   Procedure: REVERSE SHOULDER ARTHROPLASTY;  Surgeon: SJustice Britain MD;  [facility];  Service: Orthopedics;  Laterality: Right;  1259m   REVISION TOTAL SHOULDER TO REVERSE TOTAL SHOULDER Right 11/01/2019   Procedure: revision right total shoulder to reverse total shoulder;  Surgeon: SuJustice BritainMD;  [facility];  Service: Orthopedics;  Laterality: Right;  exparel block   right foot surgery Right    right foot arthrodesis and right ankle arthroplasty   TOTAL KNEE ARTHROPLASTY Left 04/21/2020   Procedure: TOTAL KNEE ARTHROPLASTY;  Surgeon: AlGaynelle ArabianMD;  [facility];  Service: Orthopedics;  Laterality: Left;  5043m  TOTAL SHOULDER REVISION Right 10/22/2021   Procedure: Revision Right shoulder reverse arthroplasty;  Surgeon: SupJustice BritainD;  [facility];  Service: Orthopedics;  Laterality: Right;  180m19m WRIST SURGERY Bilateral    arthrodesis    Current Outpatient Medications  Medication Sig Dispense Refill   amoxicillin (AMOXIL) 500 MG capsule Take 4 capsules (2,000 mg total) by mouth 1 hour before dental appt for pre-med 8 capsule 1   BIOTIN PO Take 1 tablet by mouth daily.     butalbital-acetaminophen-caffeine (FIORICET) 50-325-40 MG tablet Take 1 tablet by mouth every 6  hours as needed for headache. 30 tablet 5   Calcium Carb-Cholecalciferol (CALCIUM+D3 PO) Take 1 tablet by mouth daily.     Cholecalciferol (VITAMIN D3) 50 MCG (2000 UT) TABS Take 2,000 Units by mouth daily.     methocarbamol (ROBAXIN) 500 MG tablet Take 1 tablet (500 mg total) by mouth every 8 (eight) hours as needed for spasms 30 tablet 0   Multiple Vitamin (MULTIVITAMIN) capsule Take 1 capsule by mouth daily.     naproxen sodium (ALEVE) 220 MG tablet Take 220 mg by mouth daily as needed (aches  and pains).     pantoprazole (PROTONIX) 20 MG tablet Take 1 tablet (20 mg total) by mouth daily. 90 tablet 0   predniSONE (DELTASONE) 5 MG tablet Take 1-2 tablets (5-10 mg total) by mouth daily. (Patient taking differently: Take 5 mg by mouth at bedtime.) 180 tablet 1   PRESCRIPTION MEDICATION Apply 1 application topically See admin instructions. Apply dime-size amount to face 1-2 TIMES per day as needed rosacea   Metronidazole 1% + Ivermectin 1% + Azelaic Acid 15%     Tocilizumab (ACTEMRA ACTPEN) 162 MG/0.9ML SOAJ AS DIRECTED SUBCUTANEOUS WEEKLY. (Patient taking differently: Inject 162 mg into the skin every Tuesday.) 3.6 mL 2   triamcinolone cream (KENALOG) 0.1 % Apply 1 application topically 2 (two) times daily as needed. (Patient taking differently: Apply 1 application  topically 2 (two) times daily as needed (eczema).) 15 g 1   cefadroxil (DURICEF) 500 MG capsule Take 2 capsules (1,000 mg total) by mouth 2 (two) times daily. 40 capsule 0    colestipol (COLESTID) 1 g tablet Take 2 tablets (2 g total) by mouth 2 (two) times daily. 120 tablet 1   HYDROcodone-acetaminophen (NORCO/VICODIN) 5-325 MG tablet Take 1 tablet by mouth  twice daily as needed for pain (Patient taking differently: Take 1 tablet by mouth 2 (two) times daily as needed for severe pain.) 60 tablet 0   methocarbamol (ROBAXIN) 500 MG tablet Take 1 tablet (500 mg total) by mouth 4 (four) times daily. (Patient taking differently: Take 500 mg by mouth every 6 (six) hours as needed for muscle spasms.) 45 tablet 0   oxyCODONE-acetaminophen (PERCOCET) 5-325 MG tablet Take 1 tablet by mouth every 4 (four) hours as needed (max 6 daily). 40 tablet 0   oxyCODONE-acetaminophen (PERCOCET) 5-325 MG tablet Take 1 tablet by mouth 2 (two) times daily. 30 tablet 0   No current facility-administered medications for this visit.    Allergies as of 12/18/2021 - Review Complete 12/18/2021  Allergen Reaction Noted   Sulfa antibiotics Itching and Rash 04/11/2015   Teriparatide Other (See Comments) 05/20/2015   Valproic acid and related Rash 05/15/2015   Flexeril [cyclobenzaprine] Swelling 07/28/2020   Plaquenil [hydroxychloroquine] Diarrhea 08/07/2020   Prilosec [omeprazole] Diarrhea 08/07/2020   Serotonin reuptake inhibitors (ssris) Diarrhea 10/21/2021   Arava [leflunomide]  05/20/2015   Celexa [citalopram] Diarrhea 06/02/2016   Colestipol Dermatitis, Itching, Rash, and Swelling 12/10/2021   Depakote [divalproex sodium] Rash 07/28/2020   Diflucan [fluconazole] Rash 09/03/2019   Paxil [paroxetine hcl] Diarrhea 05/20/2015   Zoloft [sertraline] Diarrhea 06/02/2016    Family History  Problem Relation Age of Onset   Breast cancer Mother 74   Nephritis Father    Epilepsy Father    Breast cancer Maternal Aunt        2 mat aunts    Multiple sclerosis Maternal Uncle    Psoriasis Paternal Aunt    Diabetes Daughter     Social History   Socioeconomic History   Marital status: Single     Spouse name: Not on file   Number of children: 2   Years of education: 12   Highest education level: Not on file  Occupational History   Occupation: Compliance Furniture conservator/restorer: [employer]  Tobacco Use   Smoking status: Former    Packs/day: 0.25    Years: 20.00    Total pack years: 5.00    Types: Cigarettes    Quit date: 07/02/2015    Years since quitting: 6.4  Passive exposure: Past   Smokeless tobacco: Never  Vaping Use   Vaping Use: Never used  Substance and Sexual Activity   Alcohol use: Yes    Alcohol/week: 2.0 standard drinks of alcohol    Types: 2 Glasses of wine per week    Comment: social   Drug use: No   Sexual activity: Yes    Birth control/protection: Other-see comments    Comment: essure coils  Other Topics Concern   Not on file  Social History Narrative   Lives at home w/ 2 cats and child every other weekend   Significant other - Gerald Stabs   Right-hand   Drinks about 2 cups of coffee or soda per day   Social Determinants of Radio broadcast assistant Strain: Not on file  Food Insecurity: No Food Insecurity (10/22/2021)   Hunger Vital Sign    Worried About Running Out of Food in the Last Year: Never true    Ran Out of Food in the Last Year: Never true  Transportation Needs: No Transportation Needs (10/22/2021)   PRAPARE - Hydrologist (Medical): No    Lack of Transportation (Non-Medical): No  Physical Activity: Not on file  Stress: Not on file  Social Connections: Not on file  Intimate Partner Violence: Not At Risk (10/22/2021)   Humiliation, Afraid, Rape, and Kick questionnaire    Fear of Current or Ex-Partner: No    Emotionally Abused: No    Physically Abused: No    Sexually Abused: No    Review of Systems:    Constitutional: No weight loss, fever or chills Cardiovascular: No chest pain Respiratory: No SOB Gastrointestinal: See HPI and otherwise negative   Physical Exam:  Vital signs: BP 92/70    Pulse 74   Ht 4' 11.5" (1.511 m)   Wt 125 lb 6 oz (56.9 kg)   BMI 24.90 kg/m    Constitutional:   Pleasant Caucasian female appears to be in NAD, Well developed, Well nourished, alert and cooperative Respiratory: Respirations even and unlabored. Lungs clear to auscultation bilaterally.   No wheezes, crackles, or rhonchi.  Cardiovascular: Normal S1, S2. No MRG. Regular rate and rhythm. No peripheral edema, cyanosis or pallor.  Gastrointestinal:  Soft, nondistended, nontender. No rebound or guarding. Normal bowel sounds. No appreciable masses or hepatomegaly. Rectal:  Not performed.  Psychiatric: Oriented to person, place and time. Demonstrates good judgement and reason without abnormal affect or behaviors.  RELEVANT LABS AND IMAGING: CBC    Component Value Date/Time   WBC 5.8 10/22/2021 1120   RBC 3.64 (L) 10/22/2021 1120   HGB 12.3 10/22/2021 1120   HCT 37.5 10/22/2021 1120   PLT 199 10/22/2021 1120   MCV 103.0 (H) 10/22/2021 1120   MCH 33.8 10/22/2021 1120   MCHC 32.8 10/22/2021 1120   RDW 13.2 10/22/2021 1120   LYMPHSABS 3.1 11/23/2019 0841   MONOABS 1.0 11/23/2019 0841   EOSABS 0.1 11/23/2019 0841   BASOSABS 0.0 11/23/2019 0841    CMP     Component Value Date/Time   NA 137 10/22/2021 1120   NA 139 04/30/2019 0000   K 4.1 10/22/2021 1120   CL 108 10/22/2021 1120   CO2 22 10/22/2021 1120   GLUCOSE 82 10/22/2021 1120   BUN 8 10/22/2021 1120   BUN 12 07/30/2019 0000   CREATININE 0.59 10/22/2021 1120   CREATININE 0.63 11/23/2019 0841   CALCIUM 8.9 10/22/2021 1120   PROT 6.3 06/30/2021 1537   ALBUMIN  4.5 05/18/2021 0807   AST 20 08/14/2020 0813   AST 14 (L) 11/23/2019 0841   ALT 15 08/14/2020 0813   ALT 11 11/23/2019 0841   ALKPHOS 63 08/14/2020 0813   BILITOT 0.4 08/14/2020 0813   BILITOT 0.2 (L) 11/23/2019 0841   GFRNONAA >60 10/22/2021 1120   GFRNONAA >60 11/23/2019 0841   GFRAA >60 09/13/2019 0830    Assessment: 1.  Chronic diarrhea: For the past 10 to 15  years, ever since gallbladder out, better with Colestipol but had allergic reaction including facial swelling and rash also distant diagnosis of IBS; likely bile salt induced 2.  Status postcholecystectomy 2006 3.  Globus sensation: Better after Pantoprazole 20 mg daily; likely related to GERD  Plan: 1.  Patient is willing to give a try to Cholestyramine instead she will take 4 g twice daily.  Prescribed number 60 packets with 1 refill.  This was sent to the [facility] outpatient pharmacy per her request. 2.  Discussed with the patient that if she develops allergic reaction she should stop this medication. 3.  Patient is going to call back to schedule her EGD and colonoscopy with Dr. Rush Landmark when the March schedule is available.  We have placed her in recall so that we can remind her.  Otherwise she will be seen as needed.  We discussed specifically that she does not need a nurse visit prior to EGD and colonoscopy as they were already previously scheduled and she has the bowel prep, this would just be a date change. She can sign consents on the same day as procedures.  Ellouise Newer, PA-C [facility] 12/18/2021, 1:58 PM  Cc: [name], MD

## 2021-12-18 NOTE — Patient Instructions (Addendum)
Lets try the Cholestyramine 4 g twice a day.  Please let me know if you have an allergic reaction to this as well.  Otherwise you have been placed in recall for EGD and colonoscopy in March when that schedule is available.  Sincerely, Ellouise Newer, PA-C

## 2021-12-19 NOTE — Progress Notes (Signed)
Attending Physician's Attestation   I have reviewed the chart.   I agree with the Advanced Practitioner's note, impression, and recommendations with any updates as below.    Kaylin Schellenberg Mansouraty, MD [facility] Advanced Endoscopy Office # [phone]  

## 2021-12-21 ENCOUNTER — Other Ambulatory Visit: Payer: Self-pay

## 2021-12-22 ENCOUNTER — Encounter: Payer: Self-pay | Admitting: Family Medicine

## 2022-01-10 ENCOUNTER — Other Ambulatory Visit (HOSPITAL_COMMUNITY): Payer: Self-pay

## 2022-01-11 ENCOUNTER — Other Ambulatory Visit: Payer: Self-pay

## 2022-01-11 ENCOUNTER — Other Ambulatory Visit (HOSPITAL_COMMUNITY): Payer: Self-pay

## 2022-01-11 MED ORDER — PREDNISONE 5 MG PO TABS
5.0000 mg | ORAL_TABLET | Freq: Every day | ORAL | 1 refills | Status: AC
  Filled 2022-01-11: qty 180, 90d supply, fill #0

## 2022-01-18 DIAGNOSIS — Z471 Aftercare following joint replacement surgery: Secondary | ICD-10-CM | POA: Diagnosis not present

## 2022-01-18 DIAGNOSIS — Z96611 Presence of right artificial shoulder joint: Secondary | ICD-10-CM | POA: Diagnosis not present

## 2022-01-18 DIAGNOSIS — Z96612 Presence of left artificial shoulder joint: Secondary | ICD-10-CM | POA: Diagnosis not present

## 2022-01-19 NOTE — Progress Notes (Signed)
Labs from rheum reviewed

## 2022-01-20 DIAGNOSIS — Z6825 Body mass index (BMI) 25.0-25.9, adult: Secondary | ICD-10-CM | POA: Diagnosis not present

## 2022-01-20 DIAGNOSIS — M542 Cervicalgia: Secondary | ICD-10-CM | POA: Diagnosis not present

## 2022-01-20 DIAGNOSIS — G959 Disease of spinal cord, unspecified: Secondary | ICD-10-CM | POA: Diagnosis not present

## 2022-01-22 ENCOUNTER — Encounter: Payer: BC Managed Care – PPO | Admitting: Gastroenterology

## 2022-01-26 DIAGNOSIS — G959 Disease of spinal cord, unspecified: Secondary | ICD-10-CM | POA: Diagnosis not present

## 2022-01-26 DIAGNOSIS — M4802 Spinal stenosis, cervical region: Secondary | ICD-10-CM | POA: Diagnosis not present

## 2022-01-26 DIAGNOSIS — R200 Anesthesia of skin: Secondary | ICD-10-CM | POA: Diagnosis not present

## 2022-01-26 DIAGNOSIS — R202 Paresthesia of skin: Secondary | ICD-10-CM | POA: Diagnosis not present

## 2022-02-02 ENCOUNTER — Other Ambulatory Visit (HOSPITAL_COMMUNITY): Payer: Self-pay

## 2022-02-02 DIAGNOSIS — Z6825 Body mass index (BMI) 25.0-25.9, adult: Secondary | ICD-10-CM | POA: Diagnosis not present

## 2022-02-02 DIAGNOSIS — M542 Cervicalgia: Secondary | ICD-10-CM | POA: Diagnosis not present

## 2022-02-02 DIAGNOSIS — G959 Disease of spinal cord, unspecified: Secondary | ICD-10-CM | POA: Diagnosis not present

## 2022-02-02 MED ORDER — METHYLPREDNISOLONE 4 MG PO TBPK
ORAL_TABLET | ORAL | 0 refills | Status: DC
  Filled 2022-02-02: qty 21, 6d supply, fill #0

## 2022-02-06 ENCOUNTER — Other Ambulatory Visit: Payer: Self-pay | Admitting: Family Medicine

## 2022-02-08 ENCOUNTER — Other Ambulatory Visit: Payer: Self-pay

## 2022-02-08 ENCOUNTER — Other Ambulatory Visit (HOSPITAL_COMMUNITY): Payer: Self-pay

## 2022-02-08 MED ORDER — PANTOPRAZOLE SODIUM 20 MG PO TBEC
20.0000 mg | DELAYED_RELEASE_TABLET | Freq: Every day | ORAL | 0 refills | Status: DC
  Filled 2022-02-08: qty 90, 90d supply, fill #0

## 2022-02-12 DIAGNOSIS — Z1231 Encounter for screening mammogram for malignant neoplasm of breast: Secondary | ICD-10-CM | POA: Diagnosis not present

## 2022-02-12 DIAGNOSIS — Z01419 Encounter for gynecological examination (general) (routine) without abnormal findings: Secondary | ICD-10-CM | POA: Diagnosis not present

## 2022-02-12 DIAGNOSIS — Z6824 Body mass index (BMI) 24.0-24.9, adult: Secondary | ICD-10-CM | POA: Diagnosis not present

## 2022-02-14 ENCOUNTER — Other Ambulatory Visit (HOSPITAL_COMMUNITY): Payer: Self-pay

## 2022-02-15 ENCOUNTER — Other Ambulatory Visit (HOSPITAL_COMMUNITY): Payer: Self-pay

## 2022-02-15 MED ORDER — METHOCARBAMOL 500 MG PO TABS
500.0000 mg | ORAL_TABLET | Freq: Three times a day (TID) | ORAL | 0 refills | Status: AC | PRN
  Filled 2022-02-15: qty 30, 10d supply, fill #0

## 2022-03-01 DIAGNOSIS — M25552 Pain in left hip: Secondary | ICD-10-CM | POA: Diagnosis not present

## 2022-03-01 DIAGNOSIS — M069 Rheumatoid arthritis, unspecified: Secondary | ICD-10-CM | POA: Diagnosis not present

## 2022-03-01 DIAGNOSIS — M25551 Pain in right hip: Secondary | ICD-10-CM | POA: Diagnosis not present

## 2022-03-17 ENCOUNTER — Other Ambulatory Visit (HOSPITAL_COMMUNITY): Payer: Self-pay

## 2022-03-29 ENCOUNTER — Other Ambulatory Visit: Payer: Self-pay

## 2022-03-31 DIAGNOSIS — M25511 Pain in right shoulder: Secondary | ICD-10-CM | POA: Diagnosis not present

## 2022-05-09 ENCOUNTER — Encounter: Payer: Self-pay | Admitting: Family Medicine

## 2022-05-10 ENCOUNTER — Other Ambulatory Visit: Payer: Self-pay

## 2022-05-10 MED ORDER — PANTOPRAZOLE SODIUM 20 MG PO TBEC: 20.0000 mg | DELAYED_RELEASE_TABLET | Freq: Every day | ORAL | 0 refills | Status: DC

## 2022-05-10 MED ORDER — BUTALBITAL-APAP-CAFFEINE 50-325-40 MG PO TABS: 1.0000 | ORAL_TABLET | Freq: Four times a day (QID) | ORAL | 5 refills | Status: DC | PRN

## 2022-05-10 NOTE — Addendum Note (Signed)
Addended by: Asencion Partridge on: 05/10/2022 04:34 PM   Modules accepted: Orders

## 2022-05-10 NOTE — Telephone Encounter (Signed)
I have sent in the pantoprazole 20mg .

## 2022-05-12 DIAGNOSIS — T84098D Other mechanical complication of other internal joint prosthesis, subsequent encounter: Secondary | ICD-10-CM | POA: Diagnosis not present

## 2022-05-12 DIAGNOSIS — Z471 Aftercare following joint replacement surgery: Secondary | ICD-10-CM | POA: Diagnosis not present

## 2022-05-12 DIAGNOSIS — Z96611 Presence of right artificial shoulder joint: Secondary | ICD-10-CM | POA: Diagnosis not present

## 2022-05-27 DIAGNOSIS — M0579 Rheumatoid arthritis with rheumatoid factor of multiple sites without organ or systems involvement: Secondary | ICD-10-CM | POA: Diagnosis not present

## 2022-06-18 DIAGNOSIS — M542 Cervicalgia: Secondary | ICD-10-CM | POA: Diagnosis not present

## 2022-06-18 DIAGNOSIS — M545 Low back pain, unspecified: Secondary | ICD-10-CM | POA: Diagnosis not present

## 2022-06-18 DIAGNOSIS — Z96612 Presence of left artificial shoulder joint: Secondary | ICD-10-CM | POA: Diagnosis not present

## 2022-06-18 DIAGNOSIS — M47816 Spondylosis without myelopathy or radiculopathy, lumbar region: Secondary | ICD-10-CM | POA: Diagnosis not present

## 2022-06-18 DIAGNOSIS — M4316 Spondylolisthesis, lumbar region: Secondary | ICD-10-CM | POA: Diagnosis not present

## 2022-06-18 DIAGNOSIS — T84098D Other mechanical complication of other internal joint prosthesis, subsequent encounter: Secondary | ICD-10-CM | POA: Diagnosis not present

## 2022-06-18 DIAGNOSIS — Z981 Arthrodesis status: Secondary | ICD-10-CM | POA: Diagnosis not present

## 2022-06-18 DIAGNOSIS — Z882 Allergy status to sulfonamides status: Secondary | ICD-10-CM | POA: Diagnosis not present

## 2022-06-18 DIAGNOSIS — G8929 Other chronic pain: Secondary | ICD-10-CM | POA: Diagnosis not present

## 2022-06-18 DIAGNOSIS — Z96643 Presence of artificial hip joint, bilateral: Secondary | ICD-10-CM | POA: Diagnosis not present

## 2022-06-18 DIAGNOSIS — Z96611 Presence of right artificial shoulder joint: Secondary | ICD-10-CM | POA: Diagnosis not present

## 2022-06-18 DIAGNOSIS — M47812 Spondylosis without myelopathy or radiculopathy, cervical region: Secondary | ICD-10-CM | POA: Diagnosis not present

## 2022-06-18 DIAGNOSIS — R936 Abnormal findings on diagnostic imaging of limbs: Secondary | ICD-10-CM | POA: Diagnosis not present

## 2022-06-18 DIAGNOSIS — M069 Rheumatoid arthritis, unspecified: Secondary | ICD-10-CM | POA: Diagnosis not present

## 2022-06-18 DIAGNOSIS — Z5181 Encounter for therapeutic drug level monitoring: Secondary | ICD-10-CM | POA: Diagnosis not present

## 2022-06-18 DIAGNOSIS — M4802 Spinal stenosis, cervical region: Secondary | ICD-10-CM | POA: Diagnosis not present

## 2022-06-18 DIAGNOSIS — Z79891 Long term (current) use of opiate analgesic: Secondary | ICD-10-CM | POA: Diagnosis not present

## 2022-06-18 DIAGNOSIS — Y792 Prosthetic and other implants, materials and accessory orthopedic devices associated with adverse incidents: Secondary | ICD-10-CM | POA: Diagnosis not present

## 2022-06-18 DIAGNOSIS — M25512 Pain in left shoulder: Secondary | ICD-10-CM | POA: Diagnosis not present

## 2022-07-02 DIAGNOSIS — G894 Chronic pain syndrome: Secondary | ICD-10-CM | POA: Diagnosis not present

## 2022-07-02 DIAGNOSIS — Z981 Arthrodesis status: Secondary | ICD-10-CM | POA: Diagnosis not present

## 2022-07-02 DIAGNOSIS — M069 Rheumatoid arthritis, unspecified: Secondary | ICD-10-CM | POA: Diagnosis not present

## 2022-07-05 ENCOUNTER — Other Ambulatory Visit (HOSPITAL_COMMUNITY): Payer: Self-pay

## 2022-08-06 ENCOUNTER — Other Ambulatory Visit: Payer: Self-pay | Admitting: Family Medicine

## 2022-08-06 ENCOUNTER — Encounter: Payer: Self-pay | Admitting: Family Medicine

## 2022-08-06 ENCOUNTER — Telehealth: Payer: BC Managed Care – PPO | Admitting: Family Medicine

## 2022-08-06 VITALS — Ht 59.5 in | Wt 117.0 lb

## 2022-08-06 DIAGNOSIS — R4189 Other symptoms and signs involving cognitive functions and awareness: Secondary | ICD-10-CM | POA: Diagnosis not present

## 2022-08-06 DIAGNOSIS — N951 Menopausal and female climacteric states: Secondary | ICD-10-CM

## 2022-08-06 DIAGNOSIS — F419 Anxiety disorder, unspecified: Secondary | ICD-10-CM | POA: Diagnosis not present

## 2022-08-06 NOTE — Progress Notes (Signed)
Virtual Visit via Video Note  Subjective  CC:  Chief Complaint  Patient presents with   Memory Loss    Pt stated that she feels that she is having some memory issues where she can not stay on task, and hard to comprehend when someone peaks to her      I connected with Marlou Sa on 08/06/22 at 11:00 AM EDT by a video enabled telemedicine application and verified that I am speaking with the correct person using two identifiers. Location patient: Home Location provider: [facility] [address] Office Persons participating in the virtual visit: Rosabel Sermeno, Willow Ora, MD patricia kennedy CMA  I discussed the limitations of evaluation and management by telemedicine and the availability of in person appointments. The patient expressed understanding and agreed to proceed. HPI: Anne Garrett is a 47 y.o. female who was contacted today to address the problems listed above in the chief complaint. 47 year old female complains of brain fog, difficulty paying attention to conversations, poor short-term memory.  Symptoms ongoing for the last 6 to 12 months but worse over the last 3 to 4 months.  She has always done very well multitasking and has never had memory problems.  She does have a history of PMDD during a stressful time in her life.  She does have some perimenopausal symptoms including feeling hotter than usual, some vaginal dryness, some irritability.  She is having mostly regular.  But has had some irregularity over the last year.  She does see a gynecologist.  No history of ADD.  No history of depression or anxiety although the symptoms are making her more anxious.  Her boyfriend has noticed some changes.  This stresses her out.  Of note, over the last year she has moved twice, first [place] and now punctured [place].  Normal stressors of moving, buying and selling homes have been there but she is through most of them.  Her job is good.  She  is having no problems functioning or remembering at work however she is struggling to manage like she used to.  She just feels more dull.  No dysarthria or other neurologic symptoms.  No functional problems with typical home work-related functions.  Assessment  1. Brain fog   2. Perimenopausal   3. Anxiety      Plan  Symptoms are likely multifactorial but could be related to perimenopause versus other cause.:  They do not sound typical of ADHD.  She does not seem to be suffering from depression or chronic anxiety.  Recommend behavioral management strategies, further monitoring and discussion with GYN.  Could consider topical progesterone treatment.  Could consider SSRI.  If symptoms worsen, neuropsychological eval could be done.  Patient's questions answered. Of note, living [facility], has appointment with new PCP in August. I spent a total of 32 minutes for this patient encounter. Time spent included preparation, face-to-face counseling with the patient and coordination of care, review of chart and records, and documentation of the encounter.  I discussed the assessment and treatment plan with the patient. The patient was provided an opportunity to ask questions and all were answered. The patient agreed with the plan and demonstrated an understanding of the instructions.   The patient was advised to call back or seek an in-person evaluation if the symptoms worsen or if the condition fails to improve as anticipated. Follow up: As needed Visit date not found  No orders of the  defined types were placed in this encounter.     I reviewed the patients updated PMH, FH, and SocHx.    Patient Active Problem List   Diagnosis Date Noted   Prosthetic shoulder infection, subsequent encounter 11/09/2021   History of revision of total shoulder arthroplasty 10/22/2021   Primary osteoarthritis of right knee 08/25/2020   OA (osteoarthritis) of knee 04/21/2020   Status post evacuation of  hematoma 11/13/2019   Juvenile rheumatoid arthritis (HCC) 05/13/2019   Family history of breast cancer 05/13/2019   GERD (gastroesophageal reflux disease) 05/13/2019   Migraines 05/13/2019   Immunosuppression due to chronic steroid use (HCC) 05/13/2019   Rosacea 05/13/2019   S/P reverse total shoulder arthroplasty, left 09/07/2018   Lumbar pseudoarthrosis 06/04/2016   Lumbar stenosis 04/08/2016   S/P cervical spinal fusion 07/13/2015   Cervical stenosis of spinal canal 07/12/2015   HNP (herniated nucleus pulposus), cervical 06/19/2015   VAIN I (vaginal intraepithelial neoplasia grade I) 08/24/2013   H/O Clostridium difficile infection 01/16/2013   Hemangioma 01/16/2013   Secondary adrenal insufficiency (HCC) 01/16/2013   Drug-induced osteoporosis 12/28/2012   Current Meds  Medication Sig   amoxicillin (AMOXIL) 500 MG capsule Take 4 capsules (2,000 mg total) by mouth 1 hour before dental appt for pre-med   butalbital-acetaminophen-caffeine (FIORICET) 50-325-40 MG tablet Take 1 tablet by mouth every 6  hours as needed for headache.   Calcium Carb-Cholecalciferol (CALCIUM+D3 PO) Take 1 tablet by mouth daily.   Cholecalciferol (VITAMIN D3) 50 MCG (2000 UT) TABS Take 2,000 Units by mouth daily.   cholestyramine (QUESTRAN) 4 g packet Take 1 packet (4 g total) by mouth 2 (two) times daily.   methocarbamol (ROBAXIN) 500 MG tablet Take 1 tablet (500 mg total) by mouth every 8 (eight) hours as needed for spasms   Multiple Vitamin (MULTIVITAMIN) capsule Take 1 capsule by mouth daily.   naproxen sodium (ALEVE) 220 MG tablet Take 220 mg by mouth daily as needed (aches and pains).   predniSONE (DELTASONE) 5 MG tablet Take 1-2 tablets (5-10 mg total) by mouth daily.   PRESCRIPTION MEDICATION Apply 1 application topically See admin instructions. Apply dime-size amount to face 1-2 TIMES per day as needed rosacea   Metronidazole 1% + Ivermectin 1% + Azelaic Acid 15%   Tocilizumab (ACTEMRA ACTPEN) 162  MG/0.9ML SOAJ AS DIRECTED SUBCUTANEOUS WEEKLY. (Patient taking differently: Inject 162 mg into the skin every Tuesday.)   triamcinolone cream (KENALOG) 0.1 % Apply 1 application topically 2 (two) times daily as needed. (Patient taking differently: Apply 1 application  topically 2 (two) times daily as needed (eczema).)   [DISCONTINUED] pantoprazole (PROTONIX) 20 MG tablet TAKE 1 TABLET BY MOUTH EVERY DAY    Allergies: Patient is allergic to sulfa antibiotics, teriparatide, valproic acid and related, flexeril [cyclobenzaprine], plaquenil [hydroxychloroquine], prilosec [omeprazole], serotonin reuptake inhibitors (ssris), arava [leflunomide], celexa [citalopram], colestipol, depakote [divalproex sodium], diflucan [fluconazole], paxil [paroxetine hcl], and zoloft [sertraline]. Family History: Patient family history includes Breast cancer in her maternal aunt; Breast cancer (age of onset: 55) in her mother; Diabetes in her daughter; Epilepsy in her father; Multiple sclerosis in her maternal uncle; Nephritis in her father; Psoriasis in her paternal aunt. Social History:  Patient  reports that she quit smoking about 7 years ago. Her smoking use included cigarettes. She started smoking about 27 years ago. She has a 5 pack-year smoking history. She has been exposed to tobacco smoke. She has never used smokeless tobacco. She reports current alcohol use of about 2.0 standard  drinks of alcohol per week. She reports that she does not use drugs.  Review of Systems: Constitutional: Negative for fever malaise or anorexia Cardiovascular: negative for chest pain Respiratory: negative for SOB or persistent cough Gastrointestinal: negative for abdominal pain  OBJECTIVE Vitals: Ht 4' 11.5" (1.511 m)   Wt 117 lb (53.1 kg)   BMI 23.24 kg/m  General: no acute distress , A&Ox3 Psych: Normal affect, displays some anxiety, normal speech.  Fair insight.  Normal cognition  Willow Ora, MD

## 2022-08-16 DIAGNOSIS — E7849 Other hyperlipidemia: Secondary | ICD-10-CM | POA: Diagnosis not present

## 2022-08-16 DIAGNOSIS — E559 Vitamin D deficiency, unspecified: Secondary | ICD-10-CM | POA: Diagnosis not present

## 2022-08-16 DIAGNOSIS — M549 Dorsalgia, unspecified: Secondary | ICD-10-CM | POA: Diagnosis not present

## 2022-08-16 DIAGNOSIS — Z79899 Other long term (current) drug therapy: Secondary | ICD-10-CM | POA: Diagnosis not present

## 2022-08-16 DIAGNOSIS — K529 Noninfective gastroenteritis and colitis, unspecified: Secondary | ICD-10-CM | POA: Diagnosis not present

## 2022-08-16 DIAGNOSIS — M0600 Rheumatoid arthritis without rheumatoid factor, unspecified site: Secondary | ICD-10-CM | POA: Diagnosis not present

## 2022-08-16 DIAGNOSIS — Z8739 Personal history of other diseases of the musculoskeletal system and connective tissue: Secondary | ICD-10-CM | POA: Diagnosis not present

## 2022-08-20 DIAGNOSIS — M810 Age-related osteoporosis without current pathological fracture: Secondary | ICD-10-CM | POA: Diagnosis not present

## 2022-08-20 DIAGNOSIS — Z96611 Presence of right artificial shoulder joint: Secondary | ICD-10-CM | POA: Diagnosis not present

## 2022-08-20 DIAGNOSIS — Z471 Aftercare following joint replacement surgery: Secondary | ICD-10-CM | POA: Diagnosis not present

## 2022-08-26 ENCOUNTER — Encounter: Payer: Self-pay | Admitting: Family Medicine

## 2022-08-31 DIAGNOSIS — Z79899 Other long term (current) drug therapy: Secondary | ICD-10-CM | POA: Diagnosis not present

## 2022-09-03 NOTE — Telephone Encounter (Signed)
Elease Hashimoto, please abstract this lipid panel for me.  Thank you.

## 2022-09-22 DIAGNOSIS — M069 Rheumatoid arthritis, unspecified: Secondary | ICD-10-CM | POA: Diagnosis not present

## 2022-09-22 DIAGNOSIS — K219 Gastro-esophageal reflux disease without esophagitis: Secondary | ICD-10-CM | POA: Diagnosis not present

## 2022-09-22 DIAGNOSIS — G43109 Migraine with aura, not intractable, without status migrainosus: Secondary | ICD-10-CM | POA: Diagnosis not present

## 2022-09-22 DIAGNOSIS — R87610 Atypical squamous cells of undetermined significance on cytologic smear of cervix (ASC-US): Secondary | ICD-10-CM | POA: Diagnosis not present

## 2022-09-24 DIAGNOSIS — Z981 Arthrodesis status: Secondary | ICD-10-CM | POA: Diagnosis not present

## 2022-09-24 DIAGNOSIS — M4317 Spondylolisthesis, lumbosacral region: Secondary | ICD-10-CM | POA: Diagnosis not present

## 2022-09-24 DIAGNOSIS — M549 Dorsalgia, unspecified: Secondary | ICD-10-CM | POA: Diagnosis not present

## 2022-10-21 DIAGNOSIS — Z79899 Other long term (current) drug therapy: Secondary | ICD-10-CM | POA: Diagnosis not present

## 2022-10-21 DIAGNOSIS — M0600 Rheumatoid arthritis without rheumatoid factor, unspecified site: Secondary | ICD-10-CM | POA: Diagnosis not present

## 2022-10-25 DIAGNOSIS — Z981 Arthrodesis status: Secondary | ICD-10-CM | POA: Diagnosis not present

## 2022-10-25 DIAGNOSIS — M4807 Spinal stenosis, lumbosacral region: Secondary | ICD-10-CM | POA: Diagnosis not present

## 2022-10-25 DIAGNOSIS — S32059A Unspecified fracture of fifth lumbar vertebra, initial encounter for closed fracture: Secondary | ICD-10-CM | POA: Diagnosis not present

## 2022-10-25 DIAGNOSIS — M4316 Spondylolisthesis, lumbar region: Secondary | ICD-10-CM | POA: Diagnosis not present

## 2022-10-26 DIAGNOSIS — M4317 Spondylolisthesis, lumbosacral region: Secondary | ICD-10-CM | POA: Diagnosis not present

## 2022-10-26 DIAGNOSIS — M4807 Spinal stenosis, lumbosacral region: Secondary | ICD-10-CM | POA: Diagnosis not present

## 2022-10-26 DIAGNOSIS — M549 Dorsalgia, unspecified: Secondary | ICD-10-CM | POA: Diagnosis not present

## 2022-10-26 DIAGNOSIS — Z981 Arthrodesis status: Secondary | ICD-10-CM | POA: Diagnosis not present

## 2022-11-08 DIAGNOSIS — S32009K Unspecified fracture of unspecified lumbar vertebra, subsequent encounter for fracture with nonunion: Secondary | ICD-10-CM | POA: Diagnosis not present

## 2022-11-15 DIAGNOSIS — M0600 Rheumatoid arthritis without rheumatoid factor, unspecified site: Secondary | ICD-10-CM | POA: Diagnosis not present

## 2022-11-15 DIAGNOSIS — Z79899 Other long term (current) drug therapy: Secondary | ICD-10-CM | POA: Diagnosis not present

## 2022-12-09 ENCOUNTER — Encounter: Payer: Self-pay | Admitting: Family Medicine

## 2022-12-10 MED ORDER — BUTALBITAL-APAP-CAFFEINE 50-325-40 MG PO TABS: 1.0000 | ORAL_TABLET | Freq: Four times a day (QID) | ORAL | 2 refills | Status: AC | PRN

## 2022-12-13 DIAGNOSIS — Z882 Allergy status to sulfonamides status: Secondary | ICD-10-CM | POA: Diagnosis not present

## 2022-12-13 DIAGNOSIS — G43909 Migraine, unspecified, not intractable, without status migrainosus: Secondary | ICD-10-CM | POA: Diagnosis not present

## 2022-12-13 DIAGNOSIS — M5416 Radiculopathy, lumbar region: Secondary | ICD-10-CM | POA: Diagnosis not present

## 2022-12-13 DIAGNOSIS — M4316 Spondylolisthesis, lumbar region: Secondary | ICD-10-CM | POA: Diagnosis not present

## 2022-12-13 DIAGNOSIS — M960 Pseudarthrosis after fusion or arthrodesis: Secondary | ICD-10-CM | POA: Diagnosis not present

## 2022-12-13 DIAGNOSIS — M5410 Radiculopathy, site unspecified: Secondary | ICD-10-CM | POA: Diagnosis not present

## 2022-12-13 DIAGNOSIS — M4317 Spondylolisthesis, lumbosacral region: Secondary | ICD-10-CM | POA: Diagnosis not present

## 2022-12-13 DIAGNOSIS — S32009K Unspecified fracture of unspecified lumbar vertebra, subsequent encounter for fracture with nonunion: Secondary | ICD-10-CM | POA: Diagnosis not present

## 2022-12-17 ENCOUNTER — Telehealth: Payer: Self-pay

## 2022-12-17 NOTE — Transitions of Care (Post Inpatient/ED Visit) (Signed)
   12/17/2022  Name: Shaquandra Vidovich MRN: [id number] DOB: Aug 07, 1975  Today's TOC FU Call Status: Today's TOC FU Call Status:: Successful TOC FU Call Completed TOC FU Call Complete Date: 12/17/22 Patient's Name and Date of Birth confirmed.  Transition Care Management Follow-up Telephone Call Date of Discharge: 12/16/22 Discharge Facility: Other [facility]) Name of Other ([facility]) Discharge Facility: [facility] Type of Discharge: Inpatient Admission Primary Inpatient Discharge Diagnosis:: fx lumbar vertebra How have you been since you were released from the hospital?: Better Any questions or concerns?: No  Items Reviewed: Did you receive and understand the discharge instructions provided?: No Medications obtained,verified, and reconciled?: Yes (Medications Reviewed) Any new allergies since your discharge?: No Dietary orders reviewed?: Yes  Medications Reviewed Today: Medications Reviewed Today   Medications were not reviewed in this encounter     Home Care and Equipment/Supplies: Were Home Health Services Ordered?: NA Any new equipment or medical supplies ordered?: NA  Functional Questionnaire: Do you need assistance with bathing/showering or dressing?: No Do you need assistance with meal preparation?: No Do you need assistance with eating?: No Do you have difficulty maintaining continence: No Do you need assistance with getting out of bed/getting out of a chair/moving?: No Do you have difficulty managing or taking your medications?: No  Follow up appointments reviewed: PCP Follow-up appointment confirmed?:  (patient has moved out of the area) Specialist Hospital Follow-up appointment confirmed?: Yes Date of Specialist follow-up appointment?: 12/21/22 Follow-Up Specialty Provider:: surgeon Do you need transportation to your follow-up appointment?: No Do you understand care options if your condition(s) worsen?: Yes-patient verbalized understanding    SIGNATURE  Karena Addison, LPN [facility] Nurse Health Advisor Direct Dial [phone]

## 2023-01-10 DIAGNOSIS — M4317 Spondylolisthesis, lumbosacral region: Secondary | ICD-10-CM | POA: Diagnosis not present

## 2023-01-10 DIAGNOSIS — Z48811 Encounter for surgical aftercare following surgery on the nervous system: Secondary | ICD-10-CM | POA: Diagnosis not present

## 2023-01-18 ENCOUNTER — Other Ambulatory Visit: Payer: Self-pay | Admitting: Family Medicine

## 2023-01-20 DIAGNOSIS — M0600 Rheumatoid arthritis without rheumatoid factor, unspecified site: Secondary | ICD-10-CM | POA: Diagnosis not present

## 2023-01-20 DIAGNOSIS — Z79899 Other long term (current) drug therapy: Secondary | ICD-10-CM | POA: Diagnosis not present

## 2023-01-20 DIAGNOSIS — E2839 Other primary ovarian failure: Secondary | ICD-10-CM | POA: Diagnosis not present

## 2023-01-20 DIAGNOSIS — M549 Dorsalgia, unspecified: Secondary | ICD-10-CM | POA: Diagnosis not present

## 2023-01-20 DIAGNOSIS — D508 Other iron deficiency anemias: Secondary | ICD-10-CM | POA: Diagnosis not present

## 2023-01-20 DIAGNOSIS — Z8739 Personal history of other diseases of the musculoskeletal system and connective tissue: Secondary | ICD-10-CM | POA: Diagnosis not present

## 2023-02-08 ENCOUNTER — Other Ambulatory Visit (HOSPITAL_COMMUNITY): Payer: Self-pay

## 2023-02-08 MED ORDER — PREDNISONE 5 MG PO TABS: 5.0000 mg | ORAL_TABLET | Freq: Every day | ORAL | 1 refills | Status: DC

## 2023-02-14 DIAGNOSIS — R197 Diarrhea, unspecified: Secondary | ICD-10-CM | POA: Diagnosis not present

## 2023-02-15 DIAGNOSIS — R748 Abnormal levels of other serum enzymes: Secondary | ICD-10-CM | POA: Diagnosis not present

## 2023-02-15 DIAGNOSIS — R0609 Other forms of dyspnea: Secondary | ICD-10-CM | POA: Diagnosis not present

## 2023-02-15 DIAGNOSIS — R600 Localized edema: Secondary | ICD-10-CM | POA: Diagnosis not present

## 2023-02-15 DIAGNOSIS — Z13220 Encounter for screening for lipoid disorders: Secondary | ICD-10-CM | POA: Diagnosis not present

## 2023-02-15 DIAGNOSIS — M7130 Other bursal cyst, unspecified site: Secondary | ICD-10-CM | POA: Diagnosis not present

## 2023-02-15 DIAGNOSIS — R059 Cough, unspecified: Secondary | ICD-10-CM | POA: Diagnosis not present

## 2023-02-15 DIAGNOSIS — M069 Rheumatoid arthritis, unspecified: Secondary | ICD-10-CM | POA: Diagnosis not present

## 2023-02-18 DIAGNOSIS — R600 Localized edema: Secondary | ICD-10-CM | POA: Diagnosis not present

## 2023-02-28 DIAGNOSIS — S93525A Sprain of metatarsophalangeal joint of left lesser toe(s), initial encounter: Secondary | ICD-10-CM | POA: Diagnosis not present

## 2023-03-08 DIAGNOSIS — K219 Gastro-esophageal reflux disease without esophagitis: Secondary | ICD-10-CM | POA: Diagnosis not present

## 2023-03-09 DIAGNOSIS — R0609 Other forms of dyspnea: Secondary | ICD-10-CM | POA: Diagnosis not present

## 2023-03-14 DIAGNOSIS — Z981 Arthrodesis status: Secondary | ICD-10-CM | POA: Diagnosis not present

## 2023-03-14 DIAGNOSIS — S32009K Unspecified fracture of unspecified lumbar vertebra, subsequent encounter for fracture with nonunion: Secondary | ICD-10-CM | POA: Diagnosis not present

## 2023-03-15 DIAGNOSIS — M153 Secondary multiple arthritis: Secondary | ICD-10-CM | POA: Diagnosis not present

## 2023-03-15 DIAGNOSIS — M0800 Unspecified juvenile rheumatoid arthritis of unspecified site: Secondary | ICD-10-CM | POA: Diagnosis not present

## 2023-03-15 DIAGNOSIS — Z79899 Other long term (current) drug therapy: Secondary | ICD-10-CM | POA: Diagnosis not present

## 2023-03-15 DIAGNOSIS — M0600 Rheumatoid arthritis without rheumatoid factor, unspecified site: Secondary | ICD-10-CM | POA: Diagnosis not present

## 2023-03-15 DIAGNOSIS — Z7952 Long term (current) use of systemic steroids: Secondary | ICD-10-CM | POA: Diagnosis not present

## 2023-03-31 DIAGNOSIS — D125 Benign neoplasm of sigmoid colon: Secondary | ICD-10-CM | POA: Diagnosis not present

## 2023-03-31 DIAGNOSIS — K219 Gastro-esophageal reflux disease without esophagitis: Secondary | ICD-10-CM | POA: Diagnosis not present

## 2023-03-31 DIAGNOSIS — K635 Polyp of colon: Secondary | ICD-10-CM | POA: Diagnosis not present

## 2023-03-31 DIAGNOSIS — K52831 Collagenous colitis: Secondary | ICD-10-CM | POA: Diagnosis not present

## 2023-04-04 DIAGNOSIS — Z01419 Encounter for gynecological examination (general) (routine) without abnormal findings: Secondary | ICD-10-CM | POA: Diagnosis not present

## 2023-04-04 DIAGNOSIS — Z124 Encounter for screening for malignant neoplasm of cervix: Secondary | ICD-10-CM | POA: Diagnosis not present

## 2023-04-04 DIAGNOSIS — Z0000 Encounter for general adult medical examination without abnormal findings: Secondary | ICD-10-CM | POA: Diagnosis not present

## 2023-04-04 DIAGNOSIS — Z23 Encounter for immunization: Secondary | ICD-10-CM | POA: Diagnosis not present

## 2023-04-04 DIAGNOSIS — Z1151 Encounter for screening for human papillomavirus (HPV): Secondary | ICD-10-CM | POA: Diagnosis not present

## 2023-04-07 DIAGNOSIS — D225 Melanocytic nevi of trunk: Secondary | ICD-10-CM | POA: Diagnosis not present

## 2023-04-07 DIAGNOSIS — L578 Other skin changes due to chronic exposure to nonionizing radiation: Secondary | ICD-10-CM | POA: Diagnosis not present

## 2023-04-07 DIAGNOSIS — D2271 Melanocytic nevi of right lower limb, including hip: Secondary | ICD-10-CM | POA: Diagnosis not present

## 2023-04-07 DIAGNOSIS — D485 Neoplasm of uncertain behavior of skin: Secondary | ICD-10-CM | POA: Diagnosis not present

## 2023-04-21 DIAGNOSIS — Z1231 Encounter for screening mammogram for malignant neoplasm of breast: Secondary | ICD-10-CM | POA: Diagnosis not present

## 2023-05-04 DIAGNOSIS — K52831 Collagenous colitis: Secondary | ICD-10-CM | POA: Diagnosis not present

## 2023-06-28 DIAGNOSIS — M069 Rheumatoid arthritis, unspecified: Secondary | ICD-10-CM | POA: Diagnosis not present

## 2023-06-28 DIAGNOSIS — Z79899 Other long term (current) drug therapy: Secondary | ICD-10-CM | POA: Diagnosis not present

## 2023-07-05 DIAGNOSIS — M0600 Rheumatoid arthritis without rheumatoid factor, unspecified site: Secondary | ICD-10-CM | POA: Diagnosis not present

## 2023-07-06 DIAGNOSIS — K52831 Collagenous colitis: Secondary | ICD-10-CM | POA: Diagnosis not present

## 2023-07-06 DIAGNOSIS — R002 Palpitations: Secondary | ICD-10-CM | POA: Diagnosis not present

## 2023-07-06 DIAGNOSIS — N951 Menopausal and female climacteric states: Secondary | ICD-10-CM | POA: Diagnosis not present

## 2023-07-06 DIAGNOSIS — R6889 Other general symptoms and signs: Secondary | ICD-10-CM | POA: Diagnosis not present

## 2023-08-03 ENCOUNTER — Other Ambulatory Visit: Payer: Self-pay | Admitting: Family Medicine

## 2023-08-04 DIAGNOSIS — K219 Gastro-esophageal reflux disease without esophagitis: Secondary | ICD-10-CM | POA: Diagnosis not present

## 2023-08-04 DIAGNOSIS — K52831 Collagenous colitis: Secondary | ICD-10-CM | POA: Diagnosis not present

## 2023-09-13 DIAGNOSIS — Z981 Arthrodesis status: Secondary | ICD-10-CM | POA: Diagnosis not present

## 2023-10-05 DIAGNOSIS — G9589 Other specified diseases of spinal cord: Secondary | ICD-10-CM | POA: Diagnosis not present

## 2023-10-05 DIAGNOSIS — M4802 Spinal stenosis, cervical region: Secondary | ICD-10-CM | POA: Diagnosis not present

## 2023-10-05 DIAGNOSIS — Z981 Arthrodesis status: Secondary | ICD-10-CM | POA: Diagnosis not present

## 2023-10-05 DIAGNOSIS — M47812 Spondylosis without myelopathy or radiculopathy, cervical region: Secondary | ICD-10-CM | POA: Diagnosis not present

## 2023-10-06 DIAGNOSIS — Z882 Allergy status to sulfonamides status: Secondary | ICD-10-CM | POA: Diagnosis not present

## 2023-10-06 DIAGNOSIS — M0800 Unspecified juvenile rheumatoid arthritis of unspecified site: Secondary | ICD-10-CM | POA: Diagnosis not present

## 2023-10-06 DIAGNOSIS — Z96653 Presence of artificial knee joint, bilateral: Secondary | ICD-10-CM | POA: Diagnosis not present

## 2023-10-06 DIAGNOSIS — Z96611 Presence of right artificial shoulder joint: Secondary | ICD-10-CM | POA: Diagnosis not present

## 2023-10-06 DIAGNOSIS — E2740 Unspecified adrenocortical insufficiency: Secondary | ICD-10-CM | POA: Diagnosis not present

## 2023-10-06 DIAGNOSIS — Z981 Arthrodesis status: Secondary | ICD-10-CM | POA: Diagnosis not present

## 2023-10-06 DIAGNOSIS — M069 Rheumatoid arthritis, unspecified: Secondary | ICD-10-CM | POA: Diagnosis not present

## 2023-10-06 DIAGNOSIS — Z96643 Presence of artificial hip joint, bilateral: Secondary | ICD-10-CM | POA: Diagnosis not present

## 2023-10-06 DIAGNOSIS — Z96612 Presence of left artificial shoulder joint: Secondary | ICD-10-CM | POA: Diagnosis not present

## 2023-10-06 DIAGNOSIS — Z7952 Long term (current) use of systemic steroids: Secondary | ICD-10-CM | POA: Diagnosis not present

## 2023-11-08 DIAGNOSIS — K52831 Collagenous colitis: Secondary | ICD-10-CM | POA: Diagnosis not present

## 2023-11-08 DIAGNOSIS — R159 Full incontinence of feces: Secondary | ICD-10-CM | POA: Diagnosis not present

## 2023-11-10 DIAGNOSIS — M4802 Spinal stenosis, cervical region: Secondary | ICD-10-CM | POA: Diagnosis not present

## 2023-11-10 DIAGNOSIS — M5481 Occipital neuralgia: Secondary | ICD-10-CM | POA: Diagnosis not present

## 2023-12-05 DIAGNOSIS — M5442 Lumbago with sciatica, left side: Secondary | ICD-10-CM | POA: Diagnosis not present

## 2023-12-05 DIAGNOSIS — M5412 Radiculopathy, cervical region: Secondary | ICD-10-CM | POA: Diagnosis not present

## 2023-12-05 DIAGNOSIS — M5441 Lumbago with sciatica, right side: Secondary | ICD-10-CM | POA: Diagnosis not present

## 2023-12-05 DIAGNOSIS — M5416 Radiculopathy, lumbar region: Secondary | ICD-10-CM | POA: Diagnosis not present

## 2023-12-22 DIAGNOSIS — M069 Rheumatoid arthritis, unspecified: Secondary | ICD-10-CM | POA: Diagnosis not present

## 2023-12-22 DIAGNOSIS — Z981 Arthrodesis status: Secondary | ICD-10-CM | POA: Diagnosis not present

## 2023-12-22 DIAGNOSIS — G43E09 Chronic migraine with aura, not intractable, without status migrainosus: Secondary | ICD-10-CM | POA: Diagnosis not present

## 2024-01-04 DIAGNOSIS — M5414 Radiculopathy, thoracic region: Secondary | ICD-10-CM | POA: Diagnosis not present

## 2024-01-04 DIAGNOSIS — K769 Liver disease, unspecified: Secondary | ICD-10-CM | POA: Diagnosis not present
